# Patient Record
Sex: Male | Born: 1955 | Race: White | Hispanic: No | Marital: Married
Health system: Southern US, Academic
[De-identification: ages and names within clinical notes are randomized; demographics above are authoritative.]

## PROBLEM LIST (undated history)

## (undated) ENCOUNTER — Telehealth

## (undated) ENCOUNTER — Encounter

## (undated) ENCOUNTER — Encounter: Attending: Nephrology | Primary: Nephrology

## (undated) ENCOUNTER — Encounter: Payer: PRIVATE HEALTH INSURANCE | Attending: Nephrology | Primary: Nephrology

## (undated) ENCOUNTER — Ambulatory Visit

## (undated) ENCOUNTER — Telehealth: Attending: Geriatric Medicine | Primary: Geriatric Medicine

## (undated) ENCOUNTER — Other Ambulatory Visit

## (undated) ENCOUNTER — Ambulatory Visit: Payer: PRIVATE HEALTH INSURANCE

## (undated) ENCOUNTER — Encounter: Attending: Gastroenterology | Primary: Gastroenterology

## (undated) ENCOUNTER — Ambulatory Visit
Payer: MEDICARE | Attending: Student in an Organized Health Care Education/Training Program | Primary: Student in an Organized Health Care Education/Training Program

## (undated) ENCOUNTER — Ambulatory Visit: Attending: Nephrology | Primary: Nephrology

## (undated) ENCOUNTER — Ambulatory Visit: Payer: MEDICARE | Attending: Geriatric Medicine | Primary: Geriatric Medicine

## (undated) ENCOUNTER — Inpatient Hospital Stay

## (undated) ENCOUNTER — Ambulatory Visit: Payer: MEDICARE | Attending: Nephrology | Primary: Nephrology

## (undated) ENCOUNTER — Encounter
Attending: Student in an Organized Health Care Education/Training Program | Primary: Student in an Organized Health Care Education/Training Program

## (undated) ENCOUNTER — Ambulatory Visit: Payer: MEDICARE | Attending: Infectious Disease | Primary: Infectious Disease

## (undated) ENCOUNTER — Encounter: Attending: Pharmacist | Primary: Pharmacist

## (undated) ENCOUNTER — Encounter: Attending: Medical | Primary: Medical

## (undated) ENCOUNTER — Ambulatory Visit: Payer: MEDICARE

## (undated) DIAGNOSIS — E119 Type 2 diabetes mellitus without complications: Secondary | ICD-10-CM

## (undated) DIAGNOSIS — N189 Chronic kidney disease, unspecified: Secondary | ICD-10-CM

## (undated) DIAGNOSIS — E7800 Pure hypercholesterolemia, unspecified: Secondary | ICD-10-CM

## (undated) DIAGNOSIS — I10 Essential (primary) hypertension: Secondary | ICD-10-CM

## (undated) DIAGNOSIS — R011 Cardiac murmur, unspecified: Secondary | ICD-10-CM

## (undated) HISTORY — PX: CATARACT EXTRACTION: SUR2

## (undated) HISTORY — PX: PERITONEAL CATHETER INSERTION: SHX2223

---
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
## 2005-11-10 ENCOUNTER — Ambulatory Visit: Payer: Self-pay | Admitting: Unknown Physician Specialty

## 2005-11-13 ENCOUNTER — Ambulatory Visit: Payer: Self-pay | Admitting: Unknown Physician Specialty

## 2005-12-14 ENCOUNTER — Ambulatory Visit: Payer: Self-pay | Admitting: Unknown Physician Specialty

## 2006-10-06 ENCOUNTER — Ambulatory Visit: Payer: Self-pay | Admitting: Ophthalmology

## 2008-07-18 ENCOUNTER — Ambulatory Visit: Payer: Self-pay | Admitting: Gastroenterology

## 2008-09-07 ENCOUNTER — Ambulatory Visit: Payer: Self-pay | Admitting: Family Medicine

## 2010-08-01 HISTORY — PX: HERNIA REPAIR: SHX51

## 2011-01-13 DIAGNOSIS — I10 Essential (primary) hypertension: Secondary | ICD-10-CM | POA: Insufficient documentation

## 2011-09-01 HISTORY — PX: TRANSPLANTATION RENAL: SUR1385

## 2011-12-17 LAB — CBC WITH DIFFERENTIAL/PLATELET
Basophil %: 0.9 %
Eosinophil #: 0.3 10*3/uL (ref 0.0–0.7)
Eosinophil %: 6.4 %
HCT: 34.5 % — ABNORMAL LOW (ref 40.0–52.0)
Lymphocyte #: 0.3 10*3/uL — ABNORMAL LOW (ref 1.0–3.6)
MCH: 32.1 pg (ref 26.0–34.0)
MCHC: 32.5 g/dL (ref 32.0–36.0)
MCV: 99 fL (ref 80–100)
Monocyte #: 0.6 10*3/uL (ref 0.0–0.7)
Monocyte %: 10.9 %
Neutrophil #: 3.9 10*3/uL (ref 1.4–6.5)
Platelet: 213 10*3/uL (ref 150–440)
RBC: 3.49 10*6/uL — ABNORMAL LOW (ref 4.40–5.90)

## 2011-12-17 LAB — COMPREHENSIVE METABOLIC PANEL
Albumin: 3.4 g/dL (ref 3.4–5.0)
Alkaline Phosphatase: 113 U/L (ref 50–136)
Anion Gap: 4 — ABNORMAL LOW (ref 7–16)
Bilirubin,Total: 0.4 mg/dL (ref 0.2–1.0)
Creatinine: 1.37 mg/dL — ABNORMAL HIGH (ref 0.60–1.30)
Glucose: 241 mg/dL — ABNORMAL HIGH (ref 65–99)
Osmolality: 295 (ref 275–301)
Potassium: 5.5 mmol/L — ABNORMAL HIGH (ref 3.5–5.1)
Sodium: 139 mmol/L (ref 136–145)
Total Protein: 6.3 g/dL — ABNORMAL LOW (ref 6.4–8.2)

## 2011-12-17 LAB — MAGNESIUM: Magnesium: 2 mg/dL

## 2011-12-17 LAB — PROTIME-INR: Prothrombin Time: 20.1 secs — ABNORMAL HIGH (ref 11.5–14.7)

## 2011-12-24 LAB — CBC WITH DIFFERENTIAL/PLATELET
Basophil #: 0 10*3/uL (ref 0.0–0.1)
Eosinophil #: 0.2 10*3/uL (ref 0.0–0.7)
Lymphocyte #: 0.3 10*3/uL — ABNORMAL LOW (ref 1.0–3.6)
Lymphocyte %: 5.2 %
MCHC: 33.8 g/dL (ref 32.0–36.0)
MCV: 99 fL (ref 80–100)
Monocyte #: 0.4 10*3/uL (ref 0.0–0.7)
Monocyte %: 8.2 %
Neutrophil #: 4 10*3/uL (ref 1.4–6.5)
Neutrophil %: 81.5 %
Platelet: 200 10*3/uL (ref 150–440)
RBC: 3.21 10*6/uL — ABNORMAL LOW (ref 4.40–5.90)
RDW: 12.5 % (ref 11.5–14.5)

## 2011-12-24 LAB — BASIC METABOLIC PANEL
Anion Gap: 3 — ABNORMAL LOW (ref 7–16)
Calcium, Total: 10.3 mg/dL — ABNORMAL HIGH (ref 8.5–10.1)
Co2: 28 mmol/L (ref 21–32)
EGFR (African American): >60
EGFR (Non-African Amer.): >60
Glucose: 125 mg/dL — ABNORMAL HIGH (ref 65–99)
Osmolality: 287 (ref 275–301)

## 2011-12-24 LAB — PROTEIN / CREATININE RATIO, URINE: Protein, Random Urine: 76 mg/dL — ABNORMAL HIGH (ref 0–12)

## 2011-12-24 LAB — PHOSPHORUS: Phosphorus: 2.8 mg/dL (ref 2.5–4.9)

## 2011-12-28 LAB — BASIC METABOLIC PANEL
Anion Gap: 5 — ABNORMAL LOW (ref 7–16)
Calcium, Total: 10.3 mg/dL — ABNORMAL HIGH (ref 8.5–10.1)
Chloride: 108 mmol/L — ABNORMAL HIGH (ref 98–107)
Co2: 27 mmol/L (ref 21–32)
Creatinine: 1.23 mg/dL (ref 0.60–1.30)
Potassium: 4.9 mmol/L (ref 3.5–5.1)

## 2011-12-28 LAB — CBC WITH DIFFERENTIAL/PLATELET
Basophil #: 0 10*3/uL (ref 0.0–0.1)
Basophil %: 0.7 %
Eosinophil #: 0.2 10*3/uL (ref 0.0–0.7)
Eosinophil %: 4.9 %
HCT: 31.5 % — ABNORMAL LOW (ref 40.0–52.0)
HGB: 10.5 g/dL — ABNORMAL LOW (ref 13.0–18.0)
Lymphocyte #: 0.2 10*3/uL — ABNORMAL LOW (ref 1.0–3.6)
Lymphocyte %: 4.5 %
MCH: 32.9 pg (ref 26.0–34.0)
MCHC: 33.2 g/dL (ref 32.0–36.0)
Monocyte #: 0.5 10*3/uL (ref 0.0–0.7)
Neutrophil #: 3.7 10*3/uL (ref 1.4–6.5)
RBC: 3.18 10*6/uL — ABNORMAL LOW (ref 4.40–5.90)
RDW: 12.5 % (ref 11.5–14.5)
WBC: 4.6 10*3/uL (ref 3.8–10.6)

## 2011-12-28 LAB — PHOSPHORUS: Phosphorus: 2.3 mg/dL — ABNORMAL LOW (ref 2.5–4.9)

## 2011-12-28 LAB — PROTIME-INR
INR: 2
Prothrombin Time: 22.9 secs — ABNORMAL HIGH (ref 11.5–14.7)

## 2011-12-31 LAB — BASIC METABOLIC PANEL
Anion Gap: 3 — ABNORMAL LOW (ref 7–16)
BUN: 22 mg/dL — ABNORMAL HIGH (ref 7–18)
Calcium, Total: 10.4 mg/dL — ABNORMAL HIGH (ref 8.5–10.1)
Co2: 29 mmol/L (ref 21–32)
Creatinine: 1.32 mg/dL — ABNORMAL HIGH (ref 0.60–1.30)
EGFR (African American): >60

## 2011-12-31 LAB — CBC WITH DIFFERENTIAL/PLATELET
Basophil #: 0 10*3/uL (ref 0.0–0.1)
Basophil %: 0.8 %
HCT: 32.5 % — ABNORMAL LOW (ref 40.0–52.0)
HGB: 10.7 g/dL — ABNORMAL LOW (ref 13.0–18.0)
Lymphocyte #: 0.2 10*3/uL — ABNORMAL LOW (ref 1.0–3.6)
Lymphocyte %: 5.1 %
MCH: 32.5 pg (ref 26.0–34.0)
MCV: 99 fL (ref 80–100)
Monocyte %: 12.9 %
Neutrophil #: 3.2 10*3/uL (ref 1.4–6.5)
RBC: 3.28 10*6/uL — ABNORMAL LOW (ref 4.40–5.90)
RDW: 12.4 % (ref 11.5–14.5)

## 2011-12-31 LAB — PHOSPHORUS: Phosphorus: 2.4 mg/dL — ABNORMAL LOW (ref 2.5–4.9)

## 2011-12-31 LAB — PROTIME-INR
INR: 1.9
Prothrombin Time: 22.4 secs — ABNORMAL HIGH (ref 11.5–14.7)

## 2012-01-05 LAB — BASIC METABOLIC PANEL
Calcium, Total: 10.1 mg/dL (ref 8.5–10.1)
Chloride: 108 mmol/L — ABNORMAL HIGH (ref 98–107)
Co2: 29 mmol/L (ref 21–32)
EGFR (African American): >60
Osmolality: 289 (ref 275–301)
Potassium: 4.8 mmol/L (ref 3.5–5.1)
Sodium: 141 mmol/L (ref 136–145)

## 2012-01-05 LAB — CBC WITH DIFFERENTIAL/PLATELET
Basophil #: 0 10*3/uL (ref 0.0–0.1)
Eosinophil #: 0.3 10*3/uL (ref 0.0–0.7)
Lymphocyte %: 4 %
MCHC: 32.9 g/dL (ref 32.0–36.0)
Neutrophil %: 81.6 %
Platelet: 232 10*3/uL (ref 150–440)
RDW: 11.8 % (ref 11.5–14.5)

## 2012-01-05 LAB — PROTIME-INR: Prothrombin Time: 24.1 secs — ABNORMAL HIGH (ref 11.5–14.7)

## 2012-01-05 LAB — MAGNESIUM: Magnesium: 1.7 mg/dL — ABNORMAL LOW

## 2012-01-08 LAB — CBC WITH DIFFERENTIAL/PLATELET
Basophil #: 0 10*3/uL (ref 0.0–0.1)
Lymphocyte #: 0.2 10*3/uL — ABNORMAL LOW (ref 1.0–3.6)
MCH: 32.8 pg (ref 26.0–34.0)
Monocyte #: 0.6 10*3/uL (ref 0.0–0.7)
Monocyte %: 9.9 %
Neutrophil %: 81.3 %
Platelet: 229 10*3/uL (ref 150–440)
RBC: 3.22 10*6/uL — ABNORMAL LOW (ref 4.40–5.90)
RDW: 12 % (ref 11.5–14.5)
WBC: 6 10*3/uL (ref 3.8–10.6)

## 2012-01-08 LAB — BASIC METABOLIC PANEL
BUN: 25 mg/dL — ABNORMAL HIGH (ref 7–18)
EGFR (African American): >60
EGFR (Non-African Amer.): >60
Glucose: 154 mg/dL — ABNORMAL HIGH (ref 65–99)
Osmolality: 287 (ref 275–301)
Potassium: 4.8 mmol/L (ref 3.5–5.1)
Sodium: 140 mmol/L (ref 136–145)

## 2012-01-08 LAB — MAGNESIUM: Magnesium: 1.6 mg/dL — ABNORMAL LOW

## 2012-01-12 LAB — PROTIME-INR
INR: 2.3
Prothrombin Time: 25.9 secs — ABNORMAL HIGH (ref 11.5–14.7)

## 2012-01-12 LAB — CBC WITH DIFFERENTIAL/PLATELET
Basophil %: 0.6 %
Eosinophil #: 0.2 10*3/uL (ref 0.0–0.7)
HGB: 10.7 g/dL — ABNORMAL LOW (ref 13.0–18.0)
Lymphocyte #: 0.2 10*3/uL — ABNORMAL LOW (ref 1.0–3.6)
Lymphocyte %: 2.8 %
MCH: 32.5 pg (ref 26.0–34.0)
MCHC: 33.2 g/dL (ref 32.0–36.0)
Monocyte %: 7.8 %
Neutrophil #: 7.6 10*3/uL — ABNORMAL HIGH (ref 1.4–6.5)
RBC: 3.28 10*6/uL — ABNORMAL LOW (ref 4.40–5.90)

## 2012-01-12 LAB — BASIC METABOLIC PANEL
Calcium, Total: 10.2 mg/dL — ABNORMAL HIGH (ref 8.5–10.1)
Chloride: 106 mmol/L (ref 98–107)
Creatinine: 1.32 mg/dL — ABNORMAL HIGH (ref 0.60–1.30)
EGFR (Non-African Amer.): >60
Glucose: 139 mg/dL — ABNORMAL HIGH (ref 65–99)
Osmolality: 285 (ref 275–301)
Potassium: 4.6 mmol/L (ref 3.5–5.1)

## 2012-01-12 LAB — MAGNESIUM: Magnesium: 1.8 mg/dL

## 2012-01-12 LAB — PHOSPHORUS: Phosphorus: 2 mg/dL — ABNORMAL LOW (ref 2.5–4.9)

## 2012-01-13 LAB — URINALYSIS, COMPLETE
Bilirubin,UR: NEGATIVE
Glucose,UR: NEGATIVE mg/dL (ref 0–75)
Ph: 6 (ref 4.5–8.0)
Protein: 300

## 2012-01-15 LAB — URINE CULTURE

## 2012-01-25 LAB — CBC WITH DIFFERENTIAL/PLATELET
Basophil %: 0.8 %
Eosinophil #: 0.2 10*3/uL (ref 0.0–0.7)
Eosinophil %: 4 %
HGB: 10.4 g/dL — ABNORMAL LOW (ref 13.0–18.0)
Lymphocyte #: 0.3 10*3/uL — ABNORMAL LOW (ref 1.0–3.6)
MCH: 31.3 pg (ref 26.0–34.0)
MCHC: 32.3 g/dL (ref 32.0–36.0)
Monocyte #: 0.4 10*3/uL (ref 0.0–0.7)
Monocyte %: 6.4 %
Neutrophil #: 5 10*3/uL (ref 1.4–6.5)
Neutrophil %: 83.2 %
Platelet: 346 10*3/uL (ref 150–440)
RBC: 3.33 10*6/uL — ABNORMAL LOW (ref 4.40–5.90)
WBC: 5.9 10*3/uL (ref 3.8–10.6)

## 2012-01-25 LAB — BASIC METABOLIC PANEL
BUN: 21 mg/dL — ABNORMAL HIGH (ref 7–18)
Co2: 28 mmol/L (ref 21–32)
EGFR (African American): >60
Glucose: 105 mg/dL — ABNORMAL HIGH (ref 65–99)
Osmolality: 286 (ref 275–301)

## 2012-01-25 LAB — PHOSPHORUS: Phosphorus: 2.7 mg/dL (ref 2.5–4.9)

## 2012-01-25 LAB — PROTIME-INR
INR: 1.9
Prothrombin Time: 22.4 secs — ABNORMAL HIGH (ref 11.5–14.7)

## 2012-02-12 DIAGNOSIS — Z86718 Personal history of other venous thrombosis and embolism: Secondary | ICD-10-CM | POA: Insufficient documentation

## 2012-02-26 LAB — CBC WITH DIFFERENTIAL/PLATELET
Basophil #: 0 10*3/uL (ref 0.0–0.1)
Basophil %: 0.9 %
Eosinophil %: 4.9 %
HGB: 10.3 g/dL — ABNORMAL LOW (ref 13.0–18.0)
Lymphocyte #: 0.3 10*3/uL — ABNORMAL LOW (ref 1.0–3.6)
Lymphocyte %: 7 %
MCH: 31.5 pg (ref 26.0–34.0)
Monocyte %: 8.3 %
Neutrophil %: 78.9 %
RBC: 3.27 10*6/uL — ABNORMAL LOW (ref 4.40–5.90)
RDW: 12.9 % (ref 11.5–14.5)
WBC: 4.4 10*3/uL (ref 3.8–10.6)

## 2012-02-26 LAB — BASIC METABOLIC PANEL
Anion Gap: 4 — ABNORMAL LOW (ref 7–16)
BUN: 29 mg/dL — ABNORMAL HIGH (ref 7–18)
Calcium, Total: 9.8 mg/dL (ref 8.5–10.1)
Co2: 31 mmol/L (ref 21–32)
Creatinine: 1.31 mg/dL — ABNORMAL HIGH (ref 0.60–1.30)
EGFR (African American): >60
EGFR (Non-African Amer.): >60
Glucose: 192 mg/dL — ABNORMAL HIGH (ref 65–99)

## 2012-02-26 LAB — PROTIME-INR
INR: 1.4
Prothrombin Time: 17.1 secs — ABNORMAL HIGH (ref 11.5–14.7)

## 2012-02-26 LAB — PHOSPHORUS: Phosphorus: 3.9 mg/dL (ref 2.5–4.9)

## 2012-02-26 LAB — MAGNESIUM: Magnesium: 1.7 mg/dL — ABNORMAL LOW

## 2012-03-25 LAB — CBC WITH DIFFERENTIAL/PLATELET
Basophil #: 0 10*3/uL (ref 0.0–0.1)
Basophil %: 0.9 %
Eosinophil #: 0.2 10*3/uL (ref 0.0–0.7)
Eosinophil %: 4.9 %
HCT: 31.9 % — ABNORMAL LOW (ref 40.0–52.0)
Lymphocyte #: 0.4 10*3/uL — ABNORMAL LOW (ref 1.0–3.6)
Lymphocyte %: 8.8 %
MCH: 30.9 pg (ref 26.0–34.0)
MCHC: 32.7 g/dL (ref 32.0–36.0)
MCV: 94 fL (ref 80–100)
Monocyte #: 0.6 x10 3/mm (ref 0.2–1.0)
Neutrophil #: 3.2 10*3/uL (ref 1.4–6.5)
Neutrophil %: 72.8 %
Platelet: 170 10*3/uL (ref 150–440)
RBC: 3.38 10*6/uL — ABNORMAL LOW (ref 4.40–5.90)
RDW: 13.3 % (ref 11.5–14.5)

## 2012-03-25 LAB — PROTIME-INR
INR: 2.2
Prothrombin Time: 24.8 secs — ABNORMAL HIGH (ref 11.5–14.7)

## 2012-03-25 LAB — BASIC METABOLIC PANEL
Chloride: 109 mmol/L — ABNORMAL HIGH (ref 98–107)
Creatinine: 1.24 mg/dL (ref 0.60–1.30)
Glucose: 51 mg/dL — ABNORMAL LOW (ref 65–99)
Osmolality: 291 (ref 275–301)
Potassium: 4.1 mmol/L (ref 3.5–5.1)

## 2012-05-02 LAB — CBC WITH DIFFERENTIAL/PLATELET
Basophil #: 0 10*3/uL (ref 0.0–0.1)
Basophil %: 0.4 %
Eosinophil #: 0.2 10*3/uL (ref 0.0–0.7)
HGB: 10.8 g/dL — ABNORMAL LOW (ref 13.0–18.0)
Lymphocyte %: 7.6 %
MCHC: 32 g/dL (ref 32.0–36.0)
MCV: 95 fL (ref 80–100)
Monocyte #: 0.4 x10 3/mm (ref 0.2–1.0)
Neutrophil #: 3.9 10*3/uL (ref 1.4–6.5)
RDW: 13.8 % (ref 11.5–14.5)

## 2012-05-02 LAB — BASIC METABOLIC PANEL
BUN: 39 mg/dL — ABNORMAL HIGH (ref 7–18)
Calcium, Total: 9.6 mg/dL (ref 8.5–10.1)
Creatinine: 1.28 mg/dL (ref 0.60–1.30)
EGFR (African American): >60
EGFR (Non-African Amer.): >60
Sodium: 141 mmol/L (ref 136–145)

## 2012-05-02 LAB — PHOSPHORUS: Phosphorus: 2.6 mg/dL (ref 2.5–4.9)

## 2012-05-02 LAB — MAGNESIUM: Magnesium: 1.6 mg/dL — ABNORMAL LOW

## 2012-06-20 LAB — CBC WITH DIFFERENTIAL/PLATELET
Basophil %: 1.1 %
Eosinophil #: 0.2 10*3/uL (ref 0.0–0.7)
Eosinophil %: 4.9 %
Lymphocyte #: 0.5 10*3/uL — ABNORMAL LOW (ref 1.0–3.6)
Lymphocyte %: 11 %
MCH: 31.1 pg (ref 26.0–34.0)
MCV: 95 fL (ref 80–100)
Monocyte #: 0.5 x10 3/mm (ref 0.2–1.0)
Neutrophil #: 3.4 10*3/uL (ref 1.4–6.5)
Neutrophil %: 71.4 %
WBC: 4.7 10*3/uL (ref 3.8–10.6)

## 2012-06-20 LAB — PHOSPHORUS: Phosphorus: 2.7 mg/dL (ref 2.5–4.9)

## 2012-06-20 LAB — BASIC METABOLIC PANEL
Anion Gap: 7 (ref 7–16)
BUN: 35 mg/dL — ABNORMAL HIGH (ref 7–18)
Chloride: 110 mmol/L — ABNORMAL HIGH (ref 98–107)
Creatinine: 1.27 mg/dL (ref 0.60–1.30)
EGFR (African American): >60

## 2012-06-20 LAB — PROTIME-INR
INR: 0.9
Prothrombin Time: 12.8 secs (ref 11.5–14.7)

## 2012-06-20 LAB — MAGNESIUM: Magnesium: 1.8 mg/dL

## 2012-08-17 ENCOUNTER — Ambulatory Visit: Payer: Self-pay | Admitting: Ophthalmology

## 2012-09-12 LAB — BASIC METABOLIC PANEL
Anion Gap: 6 — ABNORMAL LOW (ref 7–16)
BUN: 36 mg/dL — ABNORMAL HIGH (ref 7–18)
EGFR (African American): >60
EGFR (Non-African Amer.): >60
Osmolality: 293 (ref 275–301)
Potassium: 4.5 mmol/L (ref 3.5–5.1)

## 2012-09-12 LAB — HEPATIC FUNCTION PANEL A (ARMC)
Bilirubin,Total: 0.4 mg/dL (ref 0.2–1.0)
SGPT (ALT): 29 U/L (ref 12–78)
Total Protein: 6.9 g/dL (ref 6.4–8.2)

## 2012-09-12 LAB — CBC WITH DIFFERENTIAL/PLATELET
Basophil #: 0 10*3/uL (ref 0.0–0.1)
Basophil %: 0.9 %
Eosinophil %: 3.2 %
HCT: 34 % — ABNORMAL LOW (ref 40.0–52.0)
HGB: 11.1 g/dL — ABNORMAL LOW (ref 13.0–18.0)
Lymphocyte #: 0.4 10*3/uL — ABNORMAL LOW (ref 1.0–3.6)
MCH: 31.5 pg (ref 26.0–34.0)
MCV: 97 fL (ref 80–100)
Monocyte %: 6.6 %
Neutrophil #: 4.6 10*3/uL (ref 1.4–6.5)
Platelet: 187 10*3/uL (ref 150–440)
RBC: 3.52 10*6/uL — ABNORMAL LOW (ref 4.40–5.90)
RDW: 13.2 % (ref 11.5–14.5)
WBC: 5.6 10*3/uL (ref 3.8–10.6)

## 2012-09-12 LAB — LIPID PANEL
Cholesterol: 98 mg/dL (ref 0–200)
HDL Cholesterol: 55 mg/dL (ref 40–60)
VLDL Cholesterol, Calc: 10 mg/dL (ref 5–40)

## 2012-09-12 LAB — MAGNESIUM: Magnesium: 1.8 mg/dL

## 2012-09-12 LAB — PHOSPHORUS: Phosphorus: 2.2 mg/dL — ABNORMAL LOW (ref 2.5–4.9)

## 2012-10-12 LAB — CBC WITH DIFFERENTIAL/PLATELET
Basophil #: 0.1 10*3/uL (ref 0.0–0.1)
Basophil %: 1.3 %
Eosinophil #: 0.2 10*3/uL (ref 0.0–0.7)
Eosinophil %: 3.6 %
HCT: 32 % — ABNORMAL LOW (ref 40.0–52.0)
HGB: 10.5 g/dL — ABNORMAL LOW (ref 13.0–18.0)
Lymphocyte %: 12.6 %
MCH: 31.9 pg (ref 26.0–34.0)
MCHC: 32.9 g/dL (ref 32.0–36.0)
Monocyte %: 10.8 %
Neutrophil %: 71.7 %
Platelet: 168 10*3/uL (ref 150–440)
RBC: 3.3 10*6/uL — ABNORMAL LOW (ref 4.40–5.90)

## 2012-10-12 LAB — BASIC METABOLIC PANEL
Anion Gap: 4 — ABNORMAL LOW (ref 7–16)
BUN: 32 mg/dL — ABNORMAL HIGH (ref 7–18)
Calcium, Total: 9.8 mg/dL (ref 8.5–10.1)
Co2: 29 mmol/L (ref 21–32)
EGFR (Non-African Amer.): >60
Glucose: 86 mg/dL (ref 65–99)
Osmolality: 291 (ref 275–301)
Potassium: 4.6 mmol/L (ref 3.5–5.1)

## 2012-10-12 LAB — PHOSPHORUS: Phosphorus: 2.5 mg/dL (ref 2.5–4.9)

## 2012-11-14 ENCOUNTER — Encounter: Payer: Self-pay | Admitting: Nephrology

## 2012-11-14 LAB — CBC WITH DIFFERENTIAL/PLATELET
Basophil #: 0 10*3/uL (ref 0.0–0.1)
Lymphocyte #: 0.6 10*3/uL — ABNORMAL LOW (ref 1.0–3.6)
MCH: 31.2 pg (ref 26.0–34.0)
MCV: 97 fL (ref 80–100)
Monocyte #: 0.4 x10 3/mm (ref 0.2–1.0)
Monocyte %: 8.4 %
Platelet: 178 10*3/uL (ref 150–440)
RDW: 12.7 % (ref 11.5–14.5)
WBC: 4.8 10*3/uL (ref 3.8–10.6)

## 2012-11-14 LAB — BASIC METABOLIC PANEL
Anion Gap: 4 — ABNORMAL LOW (ref 7–16)
BUN: 34 mg/dL — ABNORMAL HIGH (ref 7–18)
Co2: 31 mmol/L (ref 21–32)
Creatinine: 1.39 mg/dL — ABNORMAL HIGH (ref 0.60–1.30)
EGFR (African American): >60
EGFR (Non-African Amer.): 57 — ABNORMAL LOW
Glucose: 109 mg/dL — ABNORMAL HIGH (ref 65–99)
Osmolality: 295 (ref 275–301)

## 2012-11-14 LAB — MAGNESIUM: Magnesium: 1.6 mg/dL — ABNORMAL LOW

## 2012-11-14 LAB — PHOSPHORUS: Phosphorus: 2.6 mg/dL (ref 2.5–4.9)

## 2012-11-28 LAB — PHOSPHORUS: Phosphorus: 2.5 mg/dL (ref 2.5–4.9)

## 2012-11-28 LAB — BASIC METABOLIC PANEL
Anion Gap: 5 — ABNORMAL LOW (ref 7–16)
Calcium, Total: 10 mg/dL (ref 8.5–10.1)
Chloride: 111 mmol/L — ABNORMAL HIGH (ref 98–107)
Co2: 29 mmol/L (ref 21–32)
EGFR (African American): >60
EGFR (Non-African Amer.): 60 — ABNORMAL LOW
Osmolality: 295 (ref 275–301)
Sodium: 145 mmol/L (ref 136–145)

## 2012-12-14 ENCOUNTER — Encounter: Payer: Self-pay | Admitting: Nephrology

## 2013-02-07 ENCOUNTER — Encounter: Payer: Self-pay | Admitting: Nephrology

## 2013-02-07 LAB — ALT: SGPT (ALT): 23 U/L (ref 12–78)

## 2013-02-07 LAB — CBC WITH DIFFERENTIAL/PLATELET
Basophil #: 0.1 10*3/uL (ref 0.0–0.1)
Basophil %: 1.4 %
Eosinophil #: 0.2 10*3/uL (ref 0.0–0.7)
Eosinophil %: 4.6 %
HCT: 33 % — ABNORMAL LOW (ref 40.0–52.0)
HGB: 10.9 g/dL — ABNORMAL LOW (ref 13.0–18.0)
Lymphocyte #: 0.6 10*3/uL — ABNORMAL LOW (ref 1.0–3.6)
Lymphocyte %: 12.8 %
MCH: 31.2 pg (ref 26.0–34.0)
MCHC: 33.1 g/dL (ref 32.0–36.0)
MCV: 94 fL (ref 80–100)
Monocyte %: 9.2 %
Platelet: 155 10*3/uL (ref 150–440)
RDW: 12.9 % (ref 11.5–14.5)
WBC: 4.4 10*3/uL (ref 3.8–10.6)

## 2013-02-07 LAB — LIPID PANEL
Cholesterol: 85 mg/dL (ref 0–200)
HDL Cholesterol: 42 mg/dL (ref 40–60)

## 2013-02-07 LAB — BASIC METABOLIC PANEL
Anion Gap: 8 (ref 7–16)
Chloride: 109 mmol/L — ABNORMAL HIGH (ref 98–107)
Co2: 25 mmol/L (ref 21–32)
EGFR (African American): 48 — ABNORMAL LOW
EGFR (Non-African Amer.): 41 — ABNORMAL LOW
Glucose: 119 mg/dL — ABNORMAL HIGH (ref 65–99)
Osmolality: 296 (ref 275–301)
Potassium: 5.7 mmol/L — ABNORMAL HIGH (ref 3.5–5.1)

## 2013-02-07 LAB — SGOT (AST)(ARMC): SGOT(AST): 18 U/L (ref 15–37)

## 2013-02-07 LAB — PHOSPHORUS: Phosphorus: 2.8 mg/dL (ref 2.5–4.9)

## 2013-02-07 LAB — BILIRUBIN, TOTAL: Bilirubin,Total: 0.5 mg/dL (ref 0.2–1.0)

## 2013-02-07 LAB — MAGNESIUM: Magnesium: 1.9 mg/dL

## 2013-02-07 LAB — HEMOGLOBIN A1C: Hemoglobin A1C: 5.9 % (ref 4.2–6.3)

## 2013-02-11 ENCOUNTER — Encounter: Payer: Self-pay | Admitting: Nephrology

## 2013-02-13 LAB — BASIC METABOLIC PANEL
Anion Gap: 5 — ABNORMAL LOW (ref 7–16)
BUN: 46 mg/dL — ABNORMAL HIGH (ref 7–18)
Calcium, Total: 9.9 mg/dL (ref 8.5–10.1)
Co2: 26 mmol/L (ref 21–32)
Creatinine: 1.67 mg/dL — ABNORMAL HIGH (ref 0.60–1.30)
EGFR (Non-African Amer.): 46 — ABNORMAL LOW
Glucose: 35 mg/dL — CL (ref 65–99)
Osmolality: 293 (ref 275–301)
Sodium: 143 mmol/L (ref 136–145)

## 2013-02-13 LAB — CBC WITH DIFFERENTIAL/PLATELET
Basophil #: 0.1 10*3/uL (ref 0.0–0.1)
Basophil %: 1 %
Eosinophil #: 0.3 10*3/uL (ref 0.0–0.7)
Eosinophil %: 4.2 %
HCT: 33.5 % — ABNORMAL LOW (ref 40.0–52.0)
HGB: 11.1 g/dL — ABNORMAL LOW (ref 13.0–18.0)
Lymphocyte #: 1.1 10*3/uL (ref 1.0–3.6)
MCHC: 33.2 g/dL (ref 32.0–36.0)
MCV: 94 fL (ref 80–100)
Monocyte #: 0.6 x10 3/mm (ref 0.2–1.0)
RBC: 3.56 10*6/uL — ABNORMAL LOW (ref 4.40–5.90)
RDW: 13.1 % (ref 11.5–14.5)

## 2013-02-13 LAB — PHOSPHORUS: Phosphorus: 3.2 mg/dL (ref 2.5–4.9)

## 2013-02-20 LAB — BASIC METABOLIC PANEL
Anion Gap: 11 (ref 7–16)
BUN: 39 mg/dL — ABNORMAL HIGH (ref 7–18)
EGFR (African American): >60
EGFR (Non-African Amer.): 54 — ABNORMAL LOW
Osmolality: 293 (ref 275–301)
Potassium: 5 mmol/L (ref 3.5–5.1)

## 2013-02-20 LAB — CBC WITH DIFFERENTIAL/PLATELET
Basophil #: 0 10*3/uL (ref 0.0–0.1)
Basophil %: 1 %
Eosinophil #: 0.3 10*3/uL (ref 0.0–0.7)
Eosinophil %: 5.3 %
Lymphocyte #: 0.6 10*3/uL — ABNORMAL LOW (ref 1.0–3.6)
MCH: 30.8 pg (ref 26.0–34.0)
MCHC: 32.8 g/dL (ref 32.0–36.0)
MCV: 94 fL (ref 80–100)
Monocyte %: 9.2 %
Neutrophil #: 3.7 10*3/uL (ref 1.4–6.5)
Neutrophil %: 72.6 %

## 2013-02-20 LAB — MAGNESIUM: Magnesium: 1.8 mg/dL

## 2013-02-20 LAB — PHOSPHORUS: Phosphorus: 3.5 mg/dL (ref 2.5–4.9)

## 2013-03-10 LAB — CBC WITH DIFFERENTIAL/PLATELET
Basophil #: 0.1 10*3/uL (ref 0.0–0.1)
Eosinophil #: 0.3 10*3/uL (ref 0.0–0.7)
Eosinophil %: 4.8 %
Lymphocyte #: 0.6 10*3/uL — ABNORMAL LOW (ref 1.0–3.6)
Lymphocyte %: 9.8 %
Neutrophil #: 4.4 10*3/uL (ref 1.4–6.5)
Neutrophil %: 74.6 %
Platelet: 186 10*3/uL (ref 150–440)
WBC: 6 10*3/uL (ref 3.8–10.6)

## 2013-03-10 LAB — PHOSPHORUS: Phosphorus: 2.7 mg/dL (ref 2.5–4.9)

## 2013-03-10 LAB — BASIC METABOLIC PANEL
Anion Gap: 8 (ref 7–16)
Chloride: 109 mmol/L — ABNORMAL HIGH (ref 98–107)
Co2: 26 mmol/L (ref 21–32)
Creatinine: 1.4 mg/dL — ABNORMAL HIGH (ref 0.60–1.30)
EGFR (Non-African Amer.): 57 — ABNORMAL LOW
Potassium: 4.9 mmol/L (ref 3.5–5.1)

## 2013-03-10 LAB — MAGNESIUM: Magnesium: 2.1 mg/dL

## 2013-03-14 ENCOUNTER — Encounter: Payer: Self-pay | Admitting: Nephrology

## 2013-04-04 LAB — BASIC METABOLIC PANEL
BUN: 34 mg/dL — ABNORMAL HIGH (ref 7–18)
Co2: 29 mmol/L (ref 21–32)
EGFR (Non-African Amer.): 54 — ABNORMAL LOW
Glucose: 99 mg/dL (ref 65–99)
Potassium: 5 mmol/L (ref 3.5–5.1)
Sodium: 144 mmol/L (ref 136–145)

## 2013-04-04 LAB — CBC WITH DIFFERENTIAL/PLATELET
Basophil %: 0.9 %
HCT: 35.5 % — ABNORMAL LOW (ref 40.0–52.0)
Lymphocyte #: 0.6 10*3/uL — ABNORMAL LOW (ref 1.0–3.6)
Lymphocyte %: 12.1 %
MCH: 30.5 pg (ref 26.0–34.0)
MCV: 94 fL (ref 80–100)
Monocyte #: 0.4 x10 3/mm (ref 0.2–1.0)
Neutrophil %: 73.1 %
Platelet: 171 10*3/uL (ref 150–440)
RDW: 13.1 % (ref 11.5–14.5)
WBC: 5.1 10*3/uL (ref 3.8–10.6)

## 2013-04-04 LAB — MAGNESIUM: Magnesium: 1.9 mg/dL

## 2013-04-04 LAB — PHOSPHORUS: Phosphorus: 3 mg/dL (ref 2.5–4.9)

## 2013-04-13 ENCOUNTER — Encounter: Payer: Self-pay | Admitting: Nephrology

## 2013-06-07 ENCOUNTER — Encounter: Payer: Self-pay | Admitting: Nephrology

## 2013-06-07 LAB — BASIC METABOLIC PANEL
Anion Gap: 5 — ABNORMAL LOW (ref 7–16)
Co2: 28 mmol/L (ref 21–32)
Creatinine: 1.76 mg/dL — ABNORMAL HIGH (ref 0.60–1.30)
EGFR (Non-African Amer.): 43 — ABNORMAL LOW
Osmolality: 303 (ref 275–301)
Potassium: 5.2 mmol/L — ABNORMAL HIGH (ref 3.5–5.1)
Sodium: 142 mmol/L (ref 136–145)

## 2013-06-07 LAB — CBC WITH DIFFERENTIAL/PLATELET
Basophil %: 1 %
Eosinophil #: 0.3 10*3/uL (ref 0.0–0.7)
HCT: 30.7 % — ABNORMAL LOW (ref 40.0–52.0)
HGB: 10 g/dL — ABNORMAL LOW (ref 13.0–18.0)
Lymphocyte #: 0.6 10*3/uL — ABNORMAL LOW (ref 1.0–3.6)
MCH: 30.5 pg (ref 26.0–34.0)
MCV: 94 fL (ref 80–100)
Monocyte %: 7.7 %
Neutrophil #: 3.8 10*3/uL (ref 1.4–6.5)
Neutrophil %: 72.8 %
Platelet: 158 10*3/uL (ref 150–440)
RBC: 3.28 10*6/uL — ABNORMAL LOW (ref 4.40–5.90)
WBC: 5.2 10*3/uL (ref 3.8–10.6)

## 2013-06-07 LAB — MAGNESIUM: Magnesium: 2.2 mg/dL

## 2013-06-13 ENCOUNTER — Encounter: Payer: Self-pay | Admitting: Nephrology

## 2013-07-12 LAB — BASIC METABOLIC PANEL
Anion Gap: 9 (ref 7–16)
BUN: 45 mg/dL — ABNORMAL HIGH (ref 7–18)
Calcium, Total: 9.9 mg/dL (ref 8.5–10.1)
Chloride: 108 mmol/L — ABNORMAL HIGH (ref 98–107)
Creatinine: 1.62 mg/dL — ABNORMAL HIGH (ref 0.60–1.30)
EGFR (Non-African Amer.): 47 — ABNORMAL LOW
Glucose: 165 mg/dL — ABNORMAL HIGH (ref 65–99)
Osmolality: 300 (ref 275–301)
Potassium: 4.8 mmol/L (ref 3.5–5.1)
Sodium: 143 mmol/L (ref 136–145)

## 2013-07-12 LAB — PHOSPHORUS: Phosphorus: 3 mg/dL (ref 2.5–4.9)

## 2013-07-12 LAB — LIPID PANEL
Cholesterol: 104 mg/dL (ref 0–200)
Ldl Cholesterol, Calc: 39 mg/dL (ref 0–100)
Triglycerides: 56 mg/dL (ref 0–200)
VLDL Cholesterol, Calc: 11 mg/dL (ref 5–40)

## 2013-07-12 LAB — CBC WITH DIFFERENTIAL/PLATELET
Basophil #: 0 10*3/uL (ref 0.0–0.1)
Eosinophil #: 0.3 10*3/uL (ref 0.0–0.7)
Eosinophil %: 5.1 %
HCT: 32.8 % — ABNORMAL LOW (ref 40.0–52.0)
HGB: 10.7 g/dL — ABNORMAL LOW (ref 13.0–18.0)
Lymphocyte %: 11.4 %
MCH: 30.6 pg (ref 26.0–34.0)
MCHC: 32.6 g/dL (ref 32.0–36.0)
MCV: 94 fL (ref 80–100)
Monocyte #: 0.4 x10 3/mm (ref 0.2–1.0)
Monocyte %: 7.9 %
Neutrophil #: 3.9 10*3/uL (ref 1.4–6.5)
Platelet: 175 10*3/uL (ref 150–440)
RBC: 3.49 10*6/uL — ABNORMAL LOW (ref 4.40–5.90)
WBC: 5.3 10*3/uL (ref 3.8–10.6)

## 2013-07-12 LAB — MAGNESIUM: Magnesium: 2 mg/dL

## 2013-07-12 LAB — HEPATIC FUNCTION PANEL A (ARMC)
Bilirubin, Direct: 0.1 mg/dL (ref 0.00–0.20)
Bilirubin,Total: 0.7 mg/dL (ref 0.2–1.0)
SGOT(AST): 19 U/L (ref 15–37)

## 2013-07-12 LAB — GAMMA GT: GGT: 18 U/L (ref 5–85)

## 2013-07-14 ENCOUNTER — Encounter: Payer: Self-pay | Admitting: Nephrology

## 2013-08-09 LAB — CBC WITH DIFFERENTIAL/PLATELET
Basophil #: 0 10*3/uL (ref 0.0–0.1)
Eosinophil #: 0.2 10*3/uL (ref 0.0–0.7)
Eosinophil %: 4.3 %
Lymphocyte #: 0.5 10*3/uL — ABNORMAL LOW (ref 1.0–3.6)
Lymphocyte %: 10.3 %
MCHC: 32.6 g/dL (ref 32.0–36.0)
Neutrophil #: 3.9 10*3/uL (ref 1.4–6.5)
Neutrophil %: 76.3 %
RBC: 3.26 10*6/uL — ABNORMAL LOW (ref 4.40–5.90)

## 2013-08-09 LAB — BASIC METABOLIC PANEL
Anion Gap: 7 (ref 7–16)
BUN: 38 mg/dL — ABNORMAL HIGH (ref 7–18)
Calcium, Total: 9.4 mg/dL (ref 8.5–10.1)
Chloride: 110 mmol/L — ABNORMAL HIGH (ref 98–107)
Creatinine: 1.49 mg/dL — ABNORMAL HIGH (ref 0.60–1.30)
EGFR (African American): >60
EGFR (Non-African Amer.): 52 — ABNORMAL LOW
Glucose: 97 mg/dL (ref 65–99)
Potassium: 5 mmol/L (ref 3.5–5.1)

## 2013-08-09 LAB — MAGNESIUM: Magnesium: 2.1 mg/dL

## 2013-08-09 LAB — PHOSPHORUS: Phosphorus: 2.7 mg/dL (ref 2.5–4.9)

## 2013-08-14 ENCOUNTER — Encounter: Payer: Self-pay | Admitting: Nephrology

## 2013-08-18 ENCOUNTER — Ambulatory Visit: Payer: Self-pay | Admitting: Otolaryngology

## 2013-08-21 ENCOUNTER — Ambulatory Visit: Payer: Self-pay | Admitting: Otolaryngology

## 2013-08-21 LAB — BASIC METABOLIC PANEL WITH GFR
Anion Gap: 2 — ABNORMAL LOW
BUN: 50 mg/dL — ABNORMAL HIGH
Calcium, Total: 9.7 mg/dL
Chloride: 109 mmol/L — ABNORMAL HIGH
Co2: 29 mmol/L
Creatinine: 1.73 mg/dL — ABNORMAL HIGH
EGFR (African American): 50 — ABNORMAL LOW
EGFR (Non-African Amer.): 43 — ABNORMAL LOW
Glucose: 214 mg/dL — ABNORMAL HIGH
Osmolality: 299
Potassium: 5.4 mmol/L — ABNORMAL HIGH
Sodium: 140 mmol/L

## 2013-09-06 ENCOUNTER — Ambulatory Visit: Payer: Self-pay | Admitting: Otolaryngology

## 2013-09-07 LAB — PATHOLOGY REPORT

## 2013-12-01 ENCOUNTER — Encounter: Payer: Self-pay | Admitting: Nephrology

## 2013-12-14 ENCOUNTER — Encounter: Payer: Self-pay | Admitting: Nephrology

## 2014-02-19 ENCOUNTER — Encounter: Payer: Self-pay | Admitting: Nephrology

## 2014-02-19 LAB — CBC WITH DIFFERENTIAL/PLATELET
BASOS ABS: 0.1 10*3/uL (ref 0.0–0.1)
Basophil %: 1.1 %
Eosinophil #: 0.3 10*3/uL (ref 0.0–0.7)
Eosinophil %: 6.2 %
HCT: 31.7 % — ABNORMAL LOW (ref 40.0–52.0)
HGB: 10.1 g/dL — ABNORMAL LOW (ref 13.0–18.0)
LYMPHS PCT: 16.8 %
Lymphocyte #: 0.8 10*3/uL — ABNORMAL LOW (ref 1.0–3.6)
MCH: 30.7 pg (ref 26.0–34.0)
MCHC: 31.9 g/dL — ABNORMAL LOW (ref 32.0–36.0)
MCV: 96 fL (ref 80–100)
MONOS PCT: 8.7 %
Monocyte #: 0.4 x10 3/mm (ref 0.2–1.0)
NEUTROS PCT: 67.2 %
Neutrophil #: 3.2 10*3/uL (ref 1.4–6.5)
PLATELETS: 170 10*3/uL (ref 150–440)
RBC: 3.3 10*6/uL — ABNORMAL LOW (ref 4.40–5.90)
RDW: 13.4 % (ref 11.5–14.5)
WBC: 4.7 10*3/uL (ref 3.8–10.6)

## 2014-02-19 LAB — MAGNESIUM: Magnesium: 2.3 mg/dL

## 2014-02-19 LAB — BASIC METABOLIC PANEL
Anion Gap: 10 (ref 7–16)
BUN: 52 mg/dL — AB (ref 7–18)
CREATININE: 1.65 mg/dL — AB (ref 0.60–1.30)
Calcium, Total: 9.3 mg/dL (ref 8.5–10.1)
Chloride: 107 mmol/L (ref 98–107)
Co2: 25 mmol/L (ref 21–32)
GFR CALC AF AMER: 53 — AB
GFR CALC NON AF AMER: 46 — AB
Glucose: 168 mg/dL — ABNORMAL HIGH (ref 65–99)
Osmolality: 301 (ref 275–301)
POTASSIUM: 4.7 mmol/L (ref 3.5–5.1)
Sodium: 142 mmol/L (ref 136–145)

## 2014-02-19 LAB — PHOSPHORUS: Phosphorus: 3.4 mg/dL (ref 2.5–4.9)

## 2014-03-14 ENCOUNTER — Encounter: Payer: Self-pay | Admitting: Nephrology

## 2014-04-02 LAB — CBC WITH DIFFERENTIAL/PLATELET
Comment - H1-Com1: NORMAL
Eosinophil: 11 %
HCT: 34 % — AB (ref 40.0–52.0)
HGB: 10.8 g/dL — ABNORMAL LOW (ref 13.0–18.0)
Lymphocytes: 13 %
MCH: 30.7 pg (ref 26.0–34.0)
MCHC: 31.9 g/dL — ABNORMAL LOW (ref 32.0–36.0)
MCV: 96 fL (ref 80–100)
Monocytes: 10 %
Platelet: 179 10*3/uL (ref 150–440)
RBC: 3.54 10*6/uL — AB (ref 4.40–5.90)
RDW: 13.2 % (ref 11.5–14.5)
Segmented Neutrophils: 66 %
WBC: 3.9 10*3/uL (ref 3.8–10.6)

## 2014-04-02 LAB — BASIC METABOLIC PANEL
Anion Gap: 8 (ref 7–16)
BUN: 46 mg/dL — AB (ref 7–18)
CALCIUM: 11 mg/dL — AB (ref 8.5–10.1)
Chloride: 108 mmol/L — ABNORMAL HIGH (ref 98–107)
Co2: 26 mmol/L (ref 21–32)
Creatinine: 1.67 mg/dL — ABNORMAL HIGH (ref 0.60–1.30)
EGFR (African American): 53 — ABNORMAL LOW
GFR CALC NON AF AMER: 45 — AB
Glucose: 141 mg/dL — ABNORMAL HIGH (ref 65–99)
Osmolality: 297 (ref 275–301)
POTASSIUM: 5.6 mmol/L — AB (ref 3.5–5.1)
SODIUM: 142 mmol/L (ref 136–145)

## 2014-04-02 LAB — MAGNESIUM: Magnesium: 2.3 mg/dL

## 2014-04-02 LAB — PHOSPHORUS: PHOSPHORUS: 3 mg/dL (ref 2.5–4.9)

## 2014-04-13 ENCOUNTER — Encounter: Payer: Self-pay | Admitting: Nephrology

## 2014-07-30 ENCOUNTER — Ambulatory Visit: Payer: Self-pay | Admitting: Nephrology

## 2014-07-30 LAB — GAMMA GT: GGT: 22 U/L (ref 5–85)

## 2014-07-30 LAB — SGOT (AST)(ARMC): SGOT(AST): 19 U/L (ref 15–37)

## 2014-07-30 LAB — CBC WITH DIFFERENTIAL/PLATELET
BASOS ABS: 0 10*3/uL (ref 0.0–0.1)
BASOS PCT: 1.7 %
EOS ABS: 0.3 10*3/uL (ref 0.0–0.7)
Eosinophil %: 11 %
HCT: 29.8 % — AB (ref 40.0–52.0)
HGB: 9.6 g/dL — ABNORMAL LOW (ref 13.0–18.0)
LYMPHS ABS: 0.7 10*3/uL — AB (ref 1.0–3.6)
LYMPHS PCT: 23.8 %
MCH: 30.6 pg (ref 26.0–34.0)
MCHC: 32.1 g/dL (ref 32.0–36.0)
MCV: 96 fL (ref 80–100)
MONO ABS: 0.4 x10 3/mm (ref 0.2–1.0)
Monocyte %: 12.9 %
Neutrophil #: 1.4 10*3/uL (ref 1.4–6.5)
Neutrophil %: 50.6 %
Platelet: 177 10*3/uL (ref 150–440)
RBC: 3.12 10*6/uL — AB (ref 4.40–5.90)
RDW: 13.1 % (ref 11.5–14.5)
WBC: 2.8 10*3/uL — AB (ref 3.8–10.6)

## 2014-07-30 LAB — BASIC METABOLIC PANEL
ANION GAP: 8 (ref 7–16)
BUN: 54 mg/dL — ABNORMAL HIGH (ref 7–18)
CALCIUM: 9.2 mg/dL (ref 8.5–10.1)
Chloride: 110 mmol/L — ABNORMAL HIGH (ref 98–107)
Co2: 27 mmol/L (ref 21–32)
Creatinine: 1.85 mg/dL — ABNORMAL HIGH (ref 0.60–1.30)
EGFR (African American): 46 — ABNORMAL LOW
GFR CALC NON AF AMER: 40 — AB
Glucose: 83 mg/dL (ref 65–99)
OSMOLALITY: 303 (ref 275–301)
Potassium: 4.8 mmol/L (ref 3.5–5.1)
SODIUM: 145 mmol/L (ref 136–145)

## 2014-07-30 LAB — ALBUMIN: Albumin: 3.7 g/dL (ref 3.4–5.0)

## 2014-07-30 LAB — MAGNESIUM: Magnesium: 2.2 mg/dL

## 2014-07-30 LAB — LIPID PANEL
Cholesterol: 92 mg/dL (ref 0–200)
HDL Cholesterol: 53 mg/dL (ref 40–60)
LDL CHOLESTEROL, CALC: 27 mg/dL (ref 0–100)
Triglycerides: 62 mg/dL (ref 0–200)
VLDL Cholesterol, Calc: 12 mg/dL (ref 5–40)

## 2014-07-30 LAB — ALT: SGPT (ALT): 25 U/L

## 2014-07-30 LAB — BILIRUBIN, TOTAL: Bilirubin,Total: 0.8 mg/dL (ref 0.2–1.0)

## 2014-07-30 LAB — ALKALINE PHOSPHATASE: ALK PHOS: 52 U/L

## 2014-07-30 LAB — PHOSPHORUS: Phosphorus: 3.2 mg/dL (ref 2.5–4.9)

## 2014-08-13 ENCOUNTER — Ambulatory Visit: Payer: Self-pay | Admitting: Nephrology

## 2014-08-13 LAB — CBC WITH DIFFERENTIAL/PLATELET
Basophil #: 0.1 10*3/uL (ref 0.0–0.1)
Basophil %: 2.3 %
EOS ABS: 0.3 10*3/uL (ref 0.0–0.7)
Eosinophil %: 9.4 %
HCT: 29.6 % — ABNORMAL LOW (ref 40.0–52.0)
HGB: 9.6 g/dL — ABNORMAL LOW (ref 13.0–18.0)
LYMPHS ABS: 0.8 10*3/uL — AB (ref 1.0–3.6)
Lymphocyte %: 22.9 %
MCH: 30.8 pg (ref 26.0–34.0)
MCHC: 32.5 g/dL (ref 32.0–36.0)
MCV: 95 fL (ref 80–100)
Monocyte #: 0.6 x10 3/mm (ref 0.2–1.0)
Monocyte %: 16.2 %
NEUTROS PCT: 49.2 %
Neutrophil #: 1.7 10*3/uL (ref 1.4–6.5)
Platelet: 173 10*3/uL (ref 150–440)
RBC: 3.12 10*6/uL — ABNORMAL LOW (ref 4.40–5.90)
RDW: 12.9 % (ref 11.5–14.5)
WBC: 3.4 10*3/uL — ABNORMAL LOW (ref 3.8–10.6)

## 2014-08-13 LAB — BASIC METABOLIC PANEL
Anion Gap: 7 (ref 7–16)
BUN: 51 mg/dL — AB (ref 7–18)
CHLORIDE: 107 mmol/L (ref 98–107)
CREATININE: 1.82 mg/dL — AB (ref 0.60–1.30)
Calcium, Total: 9.7 mg/dL (ref 8.5–10.1)
Co2: 27 mmol/L (ref 21–32)
EGFR (African American): 47 — ABNORMAL LOW
EGFR (Non-African Amer.): 41 — ABNORMAL LOW
Glucose: 59 mg/dL — ABNORMAL LOW (ref 65–99)
OSMOLALITY: 293 (ref 275–301)
Potassium: 5.5 mmol/L — ABNORMAL HIGH (ref 3.5–5.1)
SODIUM: 141 mmol/L (ref 136–145)

## 2014-08-13 LAB — MAGNESIUM: MAGNESIUM: 2.4 mg/dL

## 2014-08-13 LAB — PHOSPHORUS: PHOSPHORUS: 3.8 mg/dL (ref 2.5–4.9)

## 2014-08-28 ENCOUNTER — Encounter: Payer: Self-pay | Admitting: Nephrology

## 2014-08-28 LAB — BASIC METABOLIC PANEL
ANION GAP: 3 — AB (ref 7–16)
BUN: 43 mg/dL — ABNORMAL HIGH (ref 7–18)
CALCIUM: 9.7 mg/dL (ref 8.5–10.1)
Chloride: 107 mmol/L (ref 98–107)
Co2: 29 mmol/L (ref 21–32)
Creatinine: 1.57 mg/dL — ABNORMAL HIGH (ref 0.60–1.30)
GFR CALC AF AMER: 56 — AB
GFR CALC NON AF AMER: 49 — AB
Glucose: 152 mg/dL — ABNORMAL HIGH (ref 65–99)
Osmolality: 291 (ref 275–301)
Potassium: 5.6 mmol/L — ABNORMAL HIGH (ref 3.5–5.1)
SODIUM: 139 mmol/L (ref 136–145)

## 2014-08-28 LAB — CBC WITH DIFFERENTIAL/PLATELET
Basophil #: 0 10*3/uL (ref 0.0–0.1)
Basophil %: 2 %
Eosinophil #: 0.3 10*3/uL (ref 0.0–0.7)
Eosinophil %: 10.4 %
HCT: 30.4 % — ABNORMAL LOW (ref 40.0–52.0)
HGB: 9.7 g/dL — AB (ref 13.0–18.0)
LYMPHS ABS: 0.6 10*3/uL — AB (ref 1.0–3.6)
Lymphocyte %: 22.6 %
MCH: 30.6 pg (ref 26.0–34.0)
MCHC: 32 g/dL (ref 32.0–36.0)
MCV: 96 fL (ref 80–100)
MONOS PCT: 17.9 %
Monocyte #: 0.4 x10 3/mm (ref 0.2–1.0)
NEUTROS ABS: 1.1 10*3/uL — AB (ref 1.4–6.5)
Neutrophil %: 47.1 %
PLATELETS: 171 10*3/uL (ref 150–440)
RBC: 3.17 10*6/uL — ABNORMAL LOW (ref 4.40–5.90)
RDW: 12.9 % (ref 11.5–14.5)
WBC: 2.4 10*3/uL — AB (ref 3.8–10.6)

## 2014-08-28 LAB — MAGNESIUM: Magnesium: 2.2 mg/dL

## 2014-08-28 LAB — PHOSPHORUS: PHOSPHORUS: 3.1 mg/dL (ref 2.5–4.9)

## 2014-09-13 ENCOUNTER — Encounter: Payer: Self-pay | Admitting: Nephrology

## 2014-10-30 ENCOUNTER — Encounter: Payer: Self-pay | Admitting: Nephrology

## 2014-10-30 LAB — CBC WITH DIFFERENTIAL/PLATELET
Basophil #: 0.1 10*3/uL (ref 0.0–0.1)
Basophil %: 2.7 %
Eosinophil #: 0.4 10*3/uL (ref 0.0–0.7)
Eosinophil %: 16.5 %
HCT: 32.9 % — AB (ref 40.0–52.0)
HGB: 10.3 g/dL — ABNORMAL LOW (ref 13.0–18.0)
LYMPHS PCT: 25.2 %
Lymphocyte #: 0.6 10*3/uL — ABNORMAL LOW (ref 1.0–3.6)
MCH: 30.1 pg (ref 26.0–34.0)
MCHC: 31.4 g/dL — ABNORMAL LOW (ref 32.0–36.0)
MCV: 96 fL (ref 80–100)
MONOS PCT: 16.1 %
Monocyte #: 0.4 x10 3/mm (ref 0.2–1.0)
NEUTROS PCT: 39.5 %
Neutrophil #: 0.9 10*3/uL — ABNORMAL LOW (ref 1.4–6.5)
PLATELETS: 159 10*3/uL (ref 150–440)
RBC: 3.42 10*6/uL — ABNORMAL LOW (ref 4.40–5.90)
RDW: 13.3 % (ref 11.5–14.5)
WBC: 2.3 10*3/uL — ABNORMAL LOW (ref 3.8–10.6)

## 2014-10-30 LAB — BASIC METABOLIC PANEL
Anion Gap: 4 — ABNORMAL LOW (ref 7–16)
BUN: 48 mg/dL — ABNORMAL HIGH (ref 7–18)
CALCIUM: 9.5 mg/dL (ref 8.5–10.1)
CHLORIDE: 111 mmol/L — AB (ref 98–107)
CO2: 29 mmol/L (ref 21–32)
CREATININE: 1.57 mg/dL — AB (ref 0.60–1.30)
EGFR (Non-African Amer.): 49 — ABNORMAL LOW
GFR CALC AF AMER: 59 — AB
GLUCOSE: 236 mg/dL — AB (ref 65–99)
Osmolality: 307 (ref 275–301)
POTASSIUM: 5.9 mmol/L — AB (ref 3.5–5.1)
SODIUM: 144 mmol/L (ref 136–145)

## 2014-10-30 LAB — PHOSPHORUS: PHOSPHORUS: 3.4 mg/dL (ref 2.5–4.9)

## 2014-10-30 LAB — MAGNESIUM: MAGNESIUM: 2.3 mg/dL

## 2014-11-05 LAB — BASIC METABOLIC PANEL
Anion Gap: 8 (ref 7–16)
BUN: 47 mg/dL — AB (ref 7–18)
CREATININE: 1.38 mg/dL — AB (ref 0.60–1.30)
Calcium, Total: 9.9 mg/dL (ref 8.5–10.1)
Chloride: 112 mmol/L — ABNORMAL HIGH (ref 98–107)
Co2: 28 mmol/L (ref 21–32)
EGFR (African American): >60
GFR CALC NON AF AMER: 57 — AB
Glucose: 136 mg/dL — ABNORMAL HIGH (ref 65–99)
OSMOLALITY: 309 (ref 275–301)
Potassium: 5.3 mmol/L — ABNORMAL HIGH (ref 3.5–5.1)
Sodium: 148 mmol/L — ABNORMAL HIGH (ref 136–145)

## 2014-11-05 LAB — MAGNESIUM: Magnesium: 2.1 mg/dL

## 2014-11-05 LAB — PHOSPHORUS: Phosphorus: 3.1 mg/dL (ref 2.5–4.9)

## 2014-11-13 ENCOUNTER — Encounter: Payer: Self-pay | Admitting: Nephrology

## 2014-12-11 LAB — MAGNESIUM: Magnesium: 2 mg/dL

## 2014-12-11 LAB — BASIC METABOLIC PANEL
Anion Gap: 5 — ABNORMAL LOW (ref 7–16)
BUN: 43 mg/dL — ABNORMAL HIGH (ref 7–18)
CALCIUM: 10.2 mg/dL — AB (ref 8.5–10.1)
CHLORIDE: 105 mmol/L (ref 98–107)
Co2: 32 mmol/L (ref 21–32)
Creatinine: 1.59 mg/dL — ABNORMAL HIGH (ref 0.60–1.30)
EGFR (African American): 58 — ABNORMAL LOW
EGFR (Non-African Amer.): 48 — ABNORMAL LOW
GLUCOSE: 98 mg/dL (ref 65–99)
OSMOLALITY: 294 (ref 275–301)
Potassium: 4.4 mmol/L (ref 3.5–5.1)
Sodium: 142 mmol/L (ref 136–145)

## 2014-12-11 LAB — LIPID PANEL
CHOLESTEROL: 126 mg/dL (ref 0–200)
HDL: 62 mg/dL — AB (ref 40–60)
Ldl Cholesterol, Calc: 52 mg/dL (ref 0–100)
TRIGLYCERIDES: 59 mg/dL (ref 0–200)
VLDL CHOLESTEROL, CALC: 12 mg/dL (ref 5–40)

## 2014-12-11 LAB — GAMMA GT: GGT: 22 U/L (ref 5–85)

## 2014-12-11 LAB — CBC WITH DIFFERENTIAL/PLATELET
BASOS PCT: 0.8 %
Basophil #: 0 10*3/uL (ref 0.0–0.1)
Eosinophil #: 0.5 10*3/uL (ref 0.0–0.7)
Eosinophil %: 15.5 %
HCT: 37.6 % — AB (ref 40.0–52.0)
HGB: 12 g/dL — AB (ref 13.0–18.0)
Lymphocyte #: 1 10*3/uL (ref 1.0–3.6)
Lymphocyte %: 29.8 %
MCH: 30.1 pg (ref 26.0–34.0)
MCHC: 31.8 g/dL — AB (ref 32.0–36.0)
MCV: 95 fL (ref 80–100)
Monocyte #: 0.4 x10 3/mm (ref 0.2–1.0)
Monocyte %: 12.8 %
NEUTROS PCT: 41.1 %
Neutrophil #: 1.4 10*3/uL (ref 1.4–6.5)
Platelet: 188 10*3/uL (ref 150–440)
RBC: 3.97 10*6/uL — ABNORMAL LOW (ref 4.40–5.90)
RDW: 13.3 % (ref 11.5–14.5)
WBC: 3.4 10*3/uL — ABNORMAL LOW (ref 3.8–10.6)

## 2014-12-11 LAB — SGOT (AST)(ARMC): SGOT(AST): 16 U/L (ref 15–37)

## 2014-12-11 LAB — ALT: ALT: 23 U/L

## 2014-12-11 LAB — PHOSPHORUS: Phosphorus: 3.3 mg/dL (ref 2.5–4.9)

## 2014-12-11 LAB — ALBUMIN: ALBUMIN: 3.8 g/dL (ref 3.4–5.0)

## 2014-12-11 LAB — BILIRUBIN, TOTAL: Bilirubin,Total: 0.4 mg/dL (ref 0.2–1.0)

## 2014-12-11 LAB — ALKALINE PHOSPHATASE: Alkaline Phosphatase: 61 U/L

## 2014-12-14 ENCOUNTER — Encounter: Payer: Self-pay | Admitting: Nephrology

## 2015-01-08 IMAGING — CT CT NECK WITHOUT CONTRAST
1 of 2 series · 10 of 14 positions shown, 13 images · non-contrast
Comparison: None

REASON FOR EXAM: RT SUBMANDIBULAR GLAND MASS
COMMENTS:

PROCEDURE:     BINDEMANN - BINDEMANN NECK WITHOUT CONTRAST  - August 18, 2013  [DATE]
RESULT:     Indication: Right submandibular mass
TECHNIQUE: Multiple sequential axial images from the apices of the lungs to
the level of the orbits obtained with 100 ml Qsovue-JRN IV contrast.

[Series 2: soft tissue · axial · 0.49mm/px · z∈[-184,+47]mm · 10 of 95 slices shown, 13 images]
[im 9/95  soft-tissue]
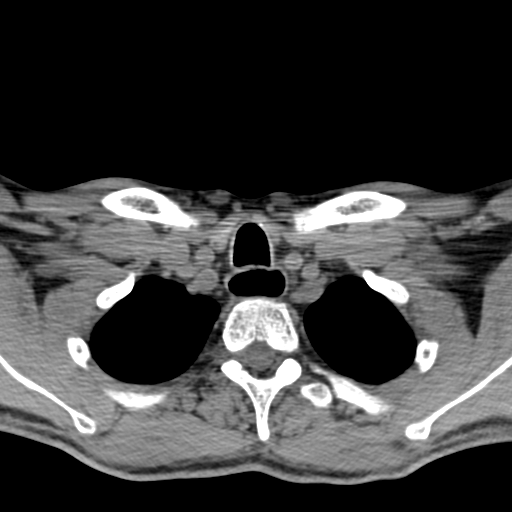
[im 9/95  bone]
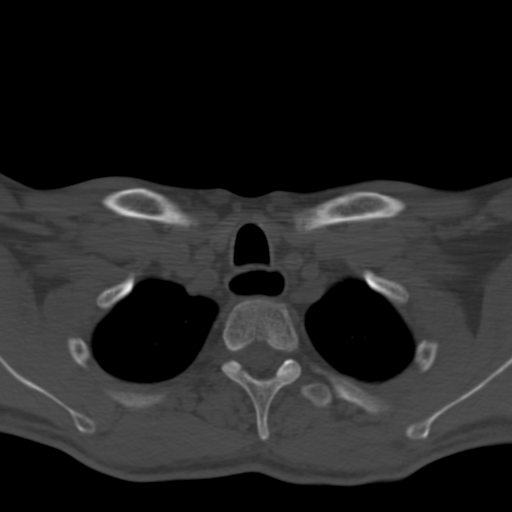
[im 18/95  bone]
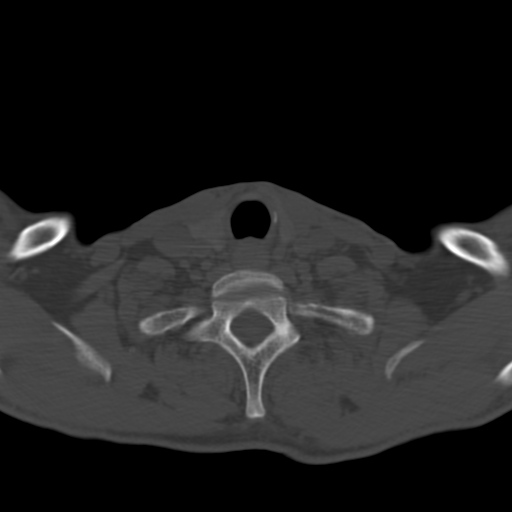
[im 26/95  bone]
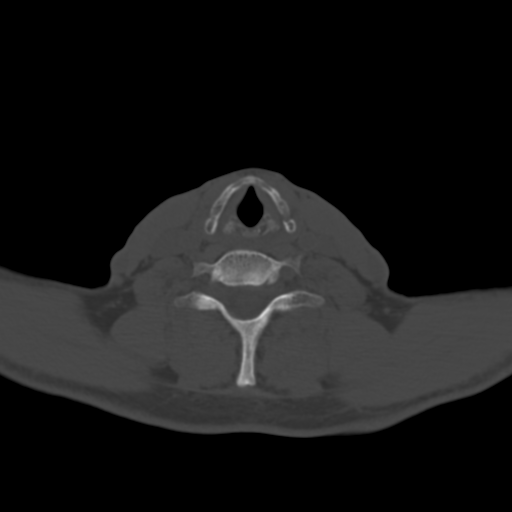
[im 35/95  bone]
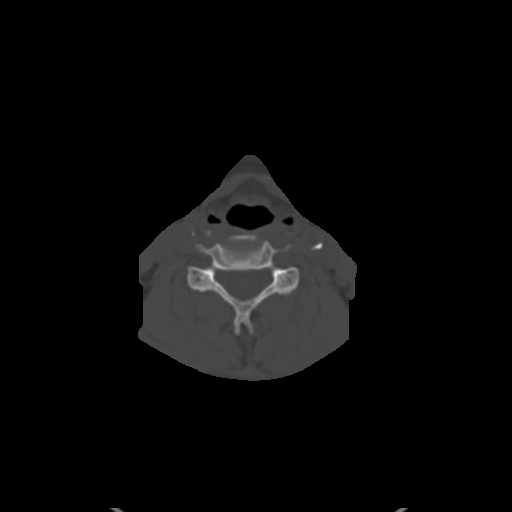
[im 43/95  soft-tissue]
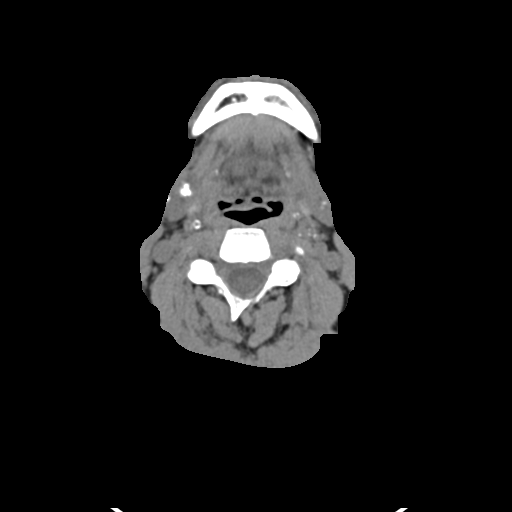
[im 43/95  bone]
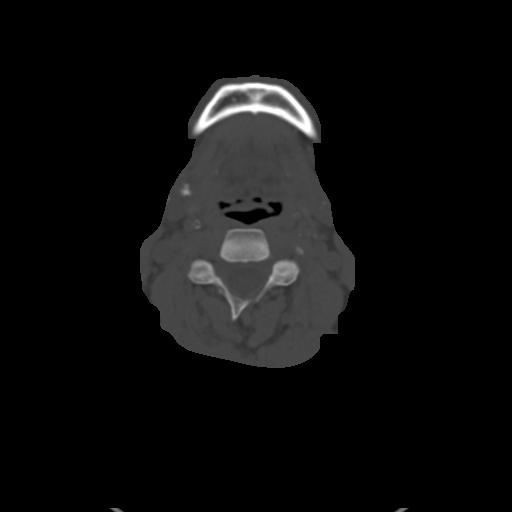
[im 52/95  bone]
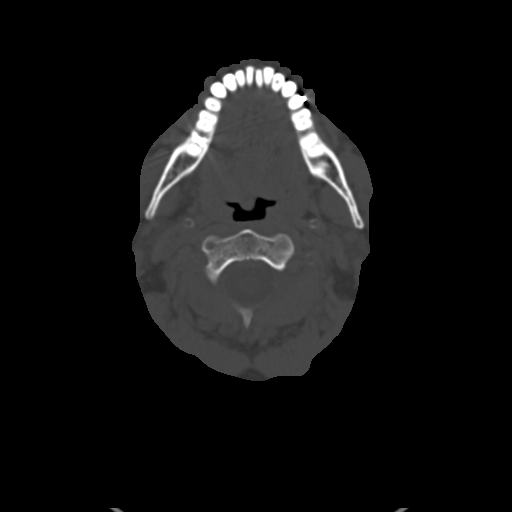
[im 60/95  bone]
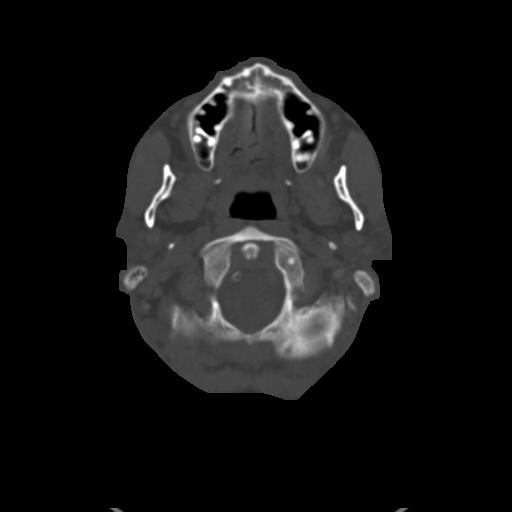
[im 69/95  bone]
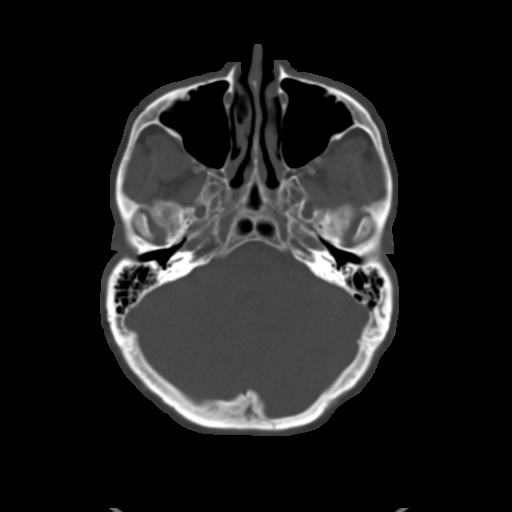
[im 77/95  soft-tissue]
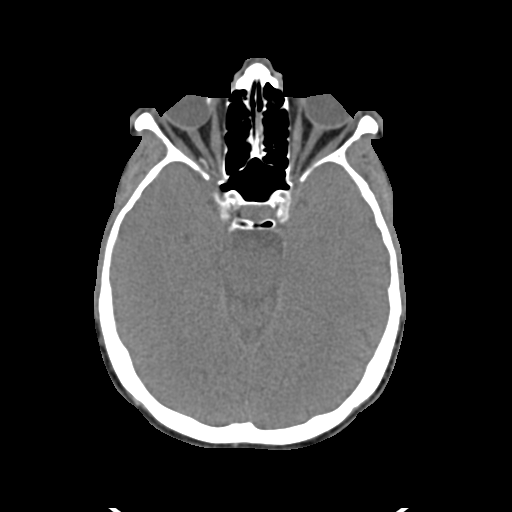
[im 77/95  bone]
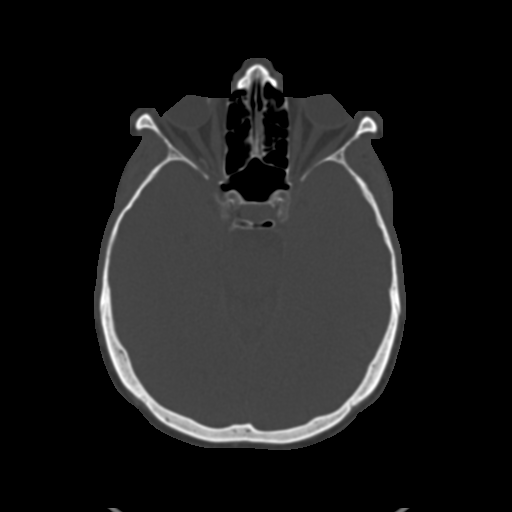
[im 86/95  bone]
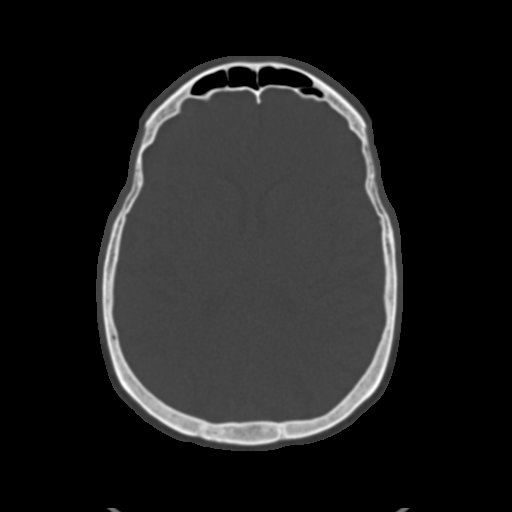

[10 of 14 positions shown; findings below may reference images not displayed]

FINDINGS: There is no lymphadenopathy. There is no nasopharyngeal or
oropharyngeal mass. There is no focal fluid collection. The airway is
patent. The visualized portions of the carotid arteries and internal jugular
veins are patent. There is mild bilateral carotid artery atherosclerosis.

There is a 12 mm calcification within the right submandibular gland most
consistent with a sialolith. There is a left tonsillar calcification noted.

The visualized portions of the brain demonstrate no focal abnormality.

The paranasal sinuses are clear.

There is no lytic or blastic osseous lesion.
IMPRESSION: There is a 12 mm sialolith within the right submandibular gland.

[REDACTED]

## 2015-02-22 ENCOUNTER — Encounter
Admit: 2015-02-22 | Disposition: A | Discharge status: Home / Self Care | Payer: Self-pay | Attending: Nephrology | Admitting: Nephrology

## 2015-02-25 DIAGNOSIS — C44519 Basal cell carcinoma of skin of other part of trunk: Secondary | ICD-10-CM | POA: Insufficient documentation

## 2015-03-15 ENCOUNTER — Encounter
Admit: 2015-03-15 | Disposition: A | Discharge status: Home / Self Care | Payer: Self-pay | Attending: Nephrology | Admitting: Nephrology

## 2015-04-03 LAB — BASIC METABOLIC PANEL
Anion Gap: 7 (ref 7–16)
BUN: 39 mg/dL — ABNORMAL HIGH
CHLORIDE: 107 mmol/L
Calcium, Total: 9.7 mg/dL
Co2: 27 mmol/L
Creatinine: 1.36 mg/dL — ABNORMAL HIGH
EGFR (African American): >60
EGFR (Non-African Amer.): 58 — ABNORMAL LOW
GLUCOSE: 129 mg/dL — AB
Potassium: 4.6 mmol/L
Sodium: 141 mmol/L

## 2015-04-03 LAB — CBC WITH DIFFERENTIAL/PLATELET
Basophil #: 0.1 10*3/uL (ref 0.0–0.1)
Basophil %: 1.5 %
Eosinophil #: 0.5 10*3/uL (ref 0.0–0.7)
Eosinophil %: 13.2 %
HCT: 30.6 % — ABNORMAL LOW (ref 40.0–52.0)
HGB: 9.9 g/dL — ABNORMAL LOW (ref 13.0–18.0)
Lymphocyte #: 0.7 10*3/uL — ABNORMAL LOW (ref 1.0–3.6)
Lymphocyte %: 18 %
MCH: 30.1 pg (ref 26.0–34.0)
MCHC: 32.4 g/dL (ref 32.0–36.0)
MCV: 93 fL (ref 80–100)
MONO ABS: 0.4 x10 3/mm (ref 0.2–1.0)
MONOS PCT: 11.5 %
NEUTROS PCT: 55.8 %
Neutrophil #: 2 10*3/uL (ref 1.4–6.5)
PLATELETS: 157 10*3/uL (ref 150–440)
RBC: 3.3 10*6/uL — AB (ref 4.40–5.90)
RDW: 13.3 % (ref 11.5–14.5)
WBC: 3.6 10*3/uL — AB (ref 3.8–10.6)

## 2015-04-03 LAB — PHOSPHORUS: Phosphorus: 3.3 mg/dL

## 2015-04-03 LAB — HEMOGLOBIN A1C: Hemoglobin A1C: 6.2 % — ABNORMAL HIGH

## 2015-04-03 LAB — MAGNESIUM: MAGNESIUM: 2 mg/dL

## 2015-04-05 NOTE — Op Note (Signed)
PATIENT NAME:  Rick Wang, Rick Wang MR#:  [id number] DATE OF BIRTH:  [birth year]  DATE OF PROCEDURE:  09/06/2013  PREOPERATIVE DIAGNOSIS: Right submandibular sialolithiasis with chronic sialadenitis.   POSTOPERATIVE DIAGNOSIS: Right submandibular sialolithiasis with chronic sialadenitis.   PROCEDURE: Excision of right submandibular gland and stone.   SURGEON: Malon Kindle, MD.   ANESTHESIA: General endotracheal.   INDICATIONS: The patient with intermittent swelling of the right submandibular gland with then associated large stone within the hilum of the gland.   FINDINGS: The gland was inflamed, stuck to surrounding tissues with a 2.5 cm stone within the hilum of the gland.   COMPLICATIONS: None.   DESCRIPTION OF PROCEDURE: After obtaining informed consent, the patient was taken to the operating room and placed in the supine position. After induction of general endotracheal anesthesia, the patient was turned 90 degrees. The right neck was prepped and draped in the usual sterile fashion with the lip exposed for visualization during dissection. The skin was injected with 1% lidocaine with epinephrine 1:200,000 approximately 2 fingerbreadths below the margin of the mandible. A 15 blade was used to incise the skin. The incision was carried down through the platysma using Harmonic scalpel to divide the platysma and associated fascia. Dissection proceeded superiorly towards the inferior aspect of the submandibular gland. The facial vein was identified, clamped and suture ligated and retracted superiorly to help protect the marginal mandibular nerve. The fascia overlying the gland was carefully dissected away bluntly when possible; however, attachments to the gland had to be divided frequently because of the inflamed nature of the gland. The lip was carefully monitored during the entire procedure for any movement during dissection to indicate any proximity to the marginal mandibular nerve. The gland was  carefully dissected out and dissected away from the floor of the neck where there were some adhesions. These were divided with the Harmonic scalpel. Dissection proceeded anteriorly to the mylohyoid muscle and the gland was dissected away from it, which it was again significantly scarred. The superior aspect of the gland was dissected out, again dividing adhesions with the Harmonic scalpel. The facial artery was encountered and I was able to preserve the facial artery just dividing small branches from it into the gland with the Harmonic scalpel. The gland was then swept under the artery and the artery retracted superiorly. The lingual nerve was identified. There was quite a bit of scarring around the lingual nerve and its branch into the gland as this was where the stone was located. The lingual nerve was preserved and I carefully dissected the branch out down into the gland as much as possible before dividing it with the bipolar cautery.   The gland was then further dissected from the floor of the neck watching carefully for the hypoglossal nerve and dissected up under the mylohyoid muscle, at which point a portion of the accessory gland and associated duct were divided and suture ligated with 2-0 silk suture. The gland was delivered and sent for pathology. The stone measured approximately 2.5 cm in greatest dimension. The floor of the neck was inspected and I did carefully dissect down and identified that the hypoglossal nerve was indeed intact. The wound was irrigated with saline and a #10 TLS drain placed and secured to the skin with a 4-0 Vicryl suture. The platysma was closed with interrupted 4-0 Vicryl suture as was the subcutaneous layer. The skin was then closed with a 5-0 Prolene in a running locked stitch. The patient was then returned to the  anesthesiologist for awakening. He was awakened and taken to the recovery room in good condition postoperatively. Blood loss was less than 25 mL.    ____________________________ Sammuel Hines. Richardson Landry, MD psb:aw D: 09/06/2013 10:30:09 ET T: 09/06/2013 10:53:17 ET JOB#: [id number]  cc: Sammuel Hines. Richardson Landry, MD, <Dictator> Riley Nearing MD ELECTRONICALLY SIGNED 09/11/2013 15:06

## 2015-05-09 ENCOUNTER — Other Ambulatory Visit
Admission: RE | Admit: 2015-05-09 | Discharge: 2015-05-09 | Disposition: A | Discharge status: Home / Self Care | Payer: BLUE CROSS/BLUE SHIELD | Source: Ambulatory Visit | Attending: Nephrology | Admitting: Nephrology

## 2015-05-09 DIAGNOSIS — Z940 Kidney transplant status: Secondary | ICD-10-CM | POA: Diagnosis not present

## 2015-05-09 LAB — CBC WITH DIFFERENTIAL/PLATELET
BASOS ABS: 0 10*3/uL (ref 0–0.1)
Basophils Relative: 1 %
EOS ABS: 0.5 10*3/uL (ref 0–0.7)
Eosinophils Relative: 14 %
HEMATOCRIT: 30.4 % — AB (ref 40.0–52.0)
HEMOGLOBIN: 9.9 g/dL — AB (ref 13.0–18.0)
Lymphocytes Relative: 25 %
Lymphs Abs: 0.8 10*3/uL — ABNORMAL LOW (ref 1.0–3.6)
MCH: 30.2 pg (ref 26.0–34.0)
MCHC: 32.4 g/dL (ref 32.0–36.0)
MCV: 93.3 fL (ref 80.0–100.0)
MONOS PCT: 12 %
Monocytes Absolute: 0.4 10*3/uL (ref 0.2–1.0)
NEUTROS PCT: 48 %
Neutro Abs: 1.6 10*3/uL (ref 1.4–6.5)
Platelets: 162 10*3/uL (ref 150–440)
RBC: 3.26 MIL/uL — ABNORMAL LOW (ref 4.40–5.90)
RDW: 13.4 % (ref 11.5–14.5)
WBC: 3.4 10*3/uL — ABNORMAL LOW (ref 3.8–10.6)

## 2015-05-09 LAB — BASIC METABOLIC PANEL
Anion gap: 4 — ABNORMAL LOW (ref 5–15)
BUN: 52 mg/dL — ABNORMAL HIGH (ref 6–20)
CO2: 28 mmol/L (ref 22–32)
CREATININE: 1.56 mg/dL — AB (ref 0.61–1.24)
Calcium: 9.5 mg/dL (ref 8.9–10.3)
Chloride: 110 mmol/L (ref 101–111)
GFR calc non Af Amer: 48 mL/min — ABNORMAL LOW (ref >60)
GFR, EST AFRICAN AMERICAN: 56 mL/min — AB (ref >60)
GLUCOSE: 148 mg/dL — AB (ref 65–99)
Potassium: 5.3 mmol/L — ABNORMAL HIGH (ref 3.5–5.1)
Sodium: 142 mmol/L (ref 135–145)

## 2015-05-09 LAB — MAGNESIUM: Magnesium: 2.2 mg/dL (ref 1.7–2.4)

## 2015-05-09 LAB — PHOSPHORUS: Phosphorus: 3.2 mg/dL (ref 2.5–4.6)

## 2015-07-03 ENCOUNTER — Other Ambulatory Visit
Admission: RE | Admit: 2015-07-03 | Discharge: 2015-07-03 | Disposition: A | Discharge status: Home / Self Care | Payer: BLUE CROSS/BLUE SHIELD | Source: Ambulatory Visit | Attending: Nephrology | Admitting: Nephrology

## 2015-07-03 DIAGNOSIS — Z940 Kidney transplant status: Secondary | ICD-10-CM | POA: Diagnosis not present

## 2015-07-03 LAB — BASIC METABOLIC PANEL
ANION GAP: 8 (ref 5–15)
BUN: 40 mg/dL — AB (ref 6–20)
CO2: 26 mmol/L (ref 22–32)
Calcium: 9.8 mg/dL (ref 8.9–10.3)
Chloride: 106 mmol/L (ref 101–111)
Creatinine, Ser: 1.46 mg/dL — ABNORMAL HIGH (ref 0.61–1.24)
GFR calc Af Amer: >60 mL/min (ref >60)
GFR calc non Af Amer: 52 mL/min — ABNORMAL LOW (ref >60)
Glucose, Bld: 187 mg/dL — ABNORMAL HIGH (ref 65–99)
Potassium: 4.9 mmol/L (ref 3.5–5.1)
SODIUM: 140 mmol/L (ref 135–145)

## 2015-07-03 LAB — CBC WITH DIFFERENTIAL/PLATELET
BASOS ABS: 0.1 10*3/uL (ref 0–0.1)
Basophils Relative: 2 %
Eosinophils Absolute: 0.3 10*3/uL (ref 0–0.7)
Eosinophils Relative: 8 %
HCT: 31 % — ABNORMAL LOW (ref 40.0–52.0)
HEMOGLOBIN: 10 g/dL — AB (ref 13.0–18.0)
Lymphocytes Relative: 21 %
Lymphs Abs: 0.7 10*3/uL — ABNORMAL LOW (ref 1.0–3.6)
MCH: 30.1 pg (ref 26.0–34.0)
MCHC: 32.1 g/dL (ref 32.0–36.0)
MCV: 93.8 fL (ref 80.0–100.0)
MONO ABS: 0.4 10*3/uL (ref 0.2–1.0)
Monocytes Relative: 12 %
NEUTROS ABS: 1.8 10*3/uL (ref 1.4–6.5)
Neutrophils Relative %: 57 %
PLATELETS: 161 10*3/uL (ref 150–440)
RBC: 3.31 MIL/uL — ABNORMAL LOW (ref 4.40–5.90)
RDW: 13.5 % (ref 11.5–14.5)
WBC: 3.2 10*3/uL — AB (ref 3.8–10.6)

## 2015-07-03 LAB — MAGNESIUM: Magnesium: 1.9 mg/dL (ref 1.7–2.4)

## 2015-07-03 LAB — PHOSPHORUS: PHOSPHORUS: 3.4 mg/dL (ref 2.5–4.6)

## 2015-08-30 ENCOUNTER — Other Ambulatory Visit
Admission: RE | Admit: 2015-08-30 | Discharge: 2015-08-30 | Disposition: A | Discharge status: Home / Self Care | Payer: BLUE CROSS/BLUE SHIELD | Source: Ambulatory Visit | Attending: Nephrology | Admitting: Nephrology

## 2015-08-30 DIAGNOSIS — Z114 Encounter for screening for human immunodeficiency virus [HIV]: Secondary | ICD-10-CM | POA: Diagnosis not present

## 2015-08-30 DIAGNOSIS — D899 Disorder involving the immune mechanism, unspecified: Secondary | ICD-10-CM | POA: Insufficient documentation

## 2015-08-30 DIAGNOSIS — Z789 Other specified health status: Secondary | ICD-10-CM | POA: Diagnosis not present

## 2015-08-30 DIAGNOSIS — Z09 Encounter for follow-up examination after completed treatment for conditions other than malignant neoplasm: Secondary | ICD-10-CM | POA: Insufficient documentation

## 2015-08-30 DIAGNOSIS — N189 Chronic kidney disease, unspecified: Secondary | ICD-10-CM | POA: Diagnosis not present

## 2015-08-30 DIAGNOSIS — N390 Urinary tract infection, site not specified: Secondary | ICD-10-CM | POA: Insufficient documentation

## 2015-08-30 DIAGNOSIS — D631 Anemia in chronic kidney disease: Secondary | ICD-10-CM | POA: Diagnosis not present

## 2015-08-30 DIAGNOSIS — E1129 Type 2 diabetes mellitus with other diabetic kidney complication: Secondary | ICD-10-CM | POA: Insufficient documentation

## 2015-08-30 DIAGNOSIS — Z940 Kidney transplant status: Secondary | ICD-10-CM | POA: Diagnosis not present

## 2015-08-30 DIAGNOSIS — E559 Vitamin D deficiency, unspecified: Secondary | ICD-10-CM | POA: Diagnosis not present

## 2015-08-30 DIAGNOSIS — Z79899 Other long term (current) drug therapy: Secondary | ICD-10-CM | POA: Insufficient documentation

## 2015-08-30 LAB — BASIC METABOLIC PANEL
ANION GAP: 7 (ref 5–15)
BUN: 44 mg/dL — ABNORMAL HIGH (ref 6–20)
CALCIUM: 9.9 mg/dL (ref 8.9–10.3)
CHLORIDE: 109 mmol/L (ref 101–111)
CO2: 25 mmol/L (ref 22–32)
Creatinine, Ser: 1.29 mg/dL — ABNORMAL HIGH (ref 0.61–1.24)
GFR calc Af Amer: >60 mL/min (ref >60)
GFR calc non Af Amer: 60 mL/min — ABNORMAL LOW (ref >60)
Glucose, Bld: 129 mg/dL — ABNORMAL HIGH (ref 65–99)
Potassium: 4.9 mmol/L (ref 3.5–5.1)
SODIUM: 141 mmol/L (ref 135–145)

## 2015-08-30 LAB — MAGNESIUM: MAGNESIUM: 1.9 mg/dL (ref 1.7–2.4)

## 2015-08-30 LAB — PHOSPHORUS: Phosphorus: 2.8 mg/dL (ref 2.5–4.6)

## 2015-10-07 ENCOUNTER — Other Ambulatory Visit
Admission: RE | Admit: 2015-10-07 | Discharge: 2015-10-07 | Disposition: A | Discharge status: Home / Self Care | Payer: BLUE CROSS/BLUE SHIELD | Source: Ambulatory Visit | Attending: Nephrology | Admitting: Nephrology

## 2015-10-07 DIAGNOSIS — Z940 Kidney transplant status: Secondary | ICD-10-CM | POA: Insufficient documentation

## 2015-10-07 LAB — BASIC METABOLIC PANEL
ANION GAP: 8 (ref 5–15)
BUN: 42 mg/dL — ABNORMAL HIGH (ref 6–20)
CALCIUM: 10 mg/dL (ref 8.9–10.3)
CO2: 25 mmol/L (ref 22–32)
Chloride: 110 mmol/L (ref 101–111)
Creatinine, Ser: 1.34 mg/dL — ABNORMAL HIGH (ref 0.61–1.24)
GFR calc Af Amer: >60 mL/min (ref >60)
GFR calc non Af Amer: 57 mL/min — ABNORMAL LOW (ref >60)
Glucose, Bld: 103 mg/dL — ABNORMAL HIGH (ref 65–99)
POTASSIUM: 5 mmol/L (ref 3.5–5.1)
SODIUM: 143 mmol/L (ref 135–145)

## 2015-10-07 LAB — MAGNESIUM: Magnesium: 1.9 mg/dL (ref 1.7–2.4)

## 2015-10-07 LAB — PHOSPHORUS: PHOSPHORUS: 2.7 mg/dL (ref 2.5–4.6)

## 2015-10-22 ENCOUNTER — Other Ambulatory Visit: Payer: Self-pay | Admitting: Family Medicine

## 2015-10-23 NOTE — Telephone Encounter (Signed)
Dr Rosanna Randy, this is a request for Novolog.  We do not follow his DM with A1C.  So you want to refill or shouldn't he be getting it from [facility] following it? Thanks

## 2015-12-02 ENCOUNTER — Other Ambulatory Visit
Admission: RE | Admit: 2015-12-02 | Discharge: 2015-12-02 | Disposition: A | Discharge status: Home / Self Care | Payer: BLUE CROSS/BLUE SHIELD | Source: Ambulatory Visit | Attending: Nephrology | Admitting: Nephrology

## 2015-12-02 DIAGNOSIS — Z79899 Other long term (current) drug therapy: Secondary | ICD-10-CM | POA: Insufficient documentation

## 2015-12-02 DIAGNOSIS — D899 Disorder involving the immune mechanism, unspecified: Secondary | ICD-10-CM | POA: Diagnosis present

## 2015-12-02 DIAGNOSIS — Z940 Kidney transplant status: Secondary | ICD-10-CM | POA: Diagnosis not present

## 2015-12-02 DIAGNOSIS — N390 Urinary tract infection, site not specified: Secondary | ICD-10-CM | POA: Diagnosis not present

## 2015-12-02 DIAGNOSIS — E1129 Type 2 diabetes mellitus with other diabetic kidney complication: Secondary | ICD-10-CM | POA: Insufficient documentation

## 2015-12-02 DIAGNOSIS — D631 Anemia in chronic kidney disease: Secondary | ICD-10-CM | POA: Diagnosis not present

## 2015-12-02 DIAGNOSIS — T8610 Unspecified complication of kidney transplant: Secondary | ICD-10-CM | POA: Insufficient documentation

## 2015-12-02 DIAGNOSIS — Z09 Encounter for follow-up examination after completed treatment for conditions other than malignant neoplasm: Secondary | ICD-10-CM | POA: Insufficient documentation

## 2015-12-02 DIAGNOSIS — E559 Vitamin D deficiency, unspecified: Secondary | ICD-10-CM | POA: Insufficient documentation

## 2015-12-02 DIAGNOSIS — Z789 Other specified health status: Secondary | ICD-10-CM | POA: Diagnosis not present

## 2015-12-02 DIAGNOSIS — Z114 Encounter for screening for human immunodeficiency virus [HIV]: Secondary | ICD-10-CM | POA: Insufficient documentation

## 2015-12-02 LAB — PHOSPHORUS: Phosphorus: 3.3 mg/dL (ref 2.5–4.6)

## 2015-12-02 LAB — MAGNESIUM: Magnesium: 1.8 mg/dL (ref 1.7–2.4)

## 2015-12-02 LAB — BASIC METABOLIC PANEL
Anion gap: 4 — ABNORMAL LOW (ref 5–15)
BUN: 36 mg/dL — ABNORMAL HIGH (ref 6–20)
CHLORIDE: 110 mmol/L (ref 101–111)
CO2: 28 mmol/L (ref 22–32)
Calcium: 9.7 mg/dL (ref 8.9–10.3)
Creatinine, Ser: 1.21 mg/dL (ref 0.61–1.24)
GFR calc non Af Amer: >60 mL/min (ref >60)
Glucose, Bld: 72 mg/dL (ref 65–99)
Potassium: 5.1 mmol/L (ref 3.5–5.1)
Sodium: 142 mmol/L (ref 135–145)

## 2016-01-13 ENCOUNTER — Other Ambulatory Visit
Admission: RE | Admit: 2016-01-13 | Discharge: 2016-01-13 | Disposition: A | Discharge status: Home / Self Care | Payer: BLUE CROSS/BLUE SHIELD | Source: Ambulatory Visit | Attending: Nephrology | Admitting: Nephrology

## 2016-01-13 DIAGNOSIS — E1129 Type 2 diabetes mellitus with other diabetic kidney complication: Secondary | ICD-10-CM | POA: Insufficient documentation

## 2016-01-13 DIAGNOSIS — E559 Vitamin D deficiency, unspecified: Secondary | ICD-10-CM | POA: Diagnosis not present

## 2016-01-13 DIAGNOSIS — Z940 Kidney transplant status: Secondary | ICD-10-CM | POA: Insufficient documentation

## 2016-01-13 DIAGNOSIS — D899 Disorder involving the immune mechanism, unspecified: Secondary | ICD-10-CM | POA: Insufficient documentation

## 2016-01-13 DIAGNOSIS — D631 Anemia in chronic kidney disease: Secondary | ICD-10-CM | POA: Insufficient documentation

## 2016-01-13 DIAGNOSIS — Z79899 Other long term (current) drug therapy: Secondary | ICD-10-CM | POA: Insufficient documentation

## 2016-01-13 LAB — BASIC METABOLIC PANEL
ANION GAP: 6 (ref 5–15)
BUN: 43 mg/dL — ABNORMAL HIGH (ref 6–20)
CHLORIDE: 106 mmol/L (ref 101–111)
CO2: 25 mmol/L (ref 22–32)
Calcium: 9.2 mg/dL (ref 8.9–10.3)
Creatinine, Ser: 1.3 mg/dL — ABNORMAL HIGH (ref 0.61–1.24)
GFR calc Af Amer: >60 mL/min (ref >60)
GFR calc non Af Amer: 59 mL/min — ABNORMAL LOW (ref >60)
Glucose, Bld: 162 mg/dL — ABNORMAL HIGH (ref 65–99)
POTASSIUM: 4.6 mmol/L (ref 3.5–5.1)
Sodium: 137 mmol/L (ref 135–145)

## 2016-01-13 LAB — PHOSPHORUS: Phosphorus: 3.1 mg/dL (ref 2.5–4.6)

## 2016-01-13 LAB — MAGNESIUM: MAGNESIUM: 1.8 mg/dL (ref 1.7–2.4)

## 2016-02-03 ENCOUNTER — Encounter: Payer: Self-pay | Admitting: Family Medicine

## 2016-02-03 ENCOUNTER — Ambulatory Visit (INDEPENDENT_AMBULATORY_CARE_PROVIDER_SITE_OTHER): Payer: BLUE CROSS/BLUE SHIELD | Admitting: Family Medicine

## 2016-02-03 VITALS — BP 134/62 | HR 72 | Temp 97.8°F | Resp 14 | Ht 70.0 in | Wt 152.0 lb

## 2016-02-03 DIAGNOSIS — H409 Unspecified glaucoma: Secondary | ICD-10-CM | POA: Insufficient documentation

## 2016-02-03 DIAGNOSIS — E785 Hyperlipidemia, unspecified: Secondary | ICD-10-CM | POA: Insufficient documentation

## 2016-02-03 DIAGNOSIS — R55 Syncope and collapse: Secondary | ICD-10-CM | POA: Diagnosis not present

## 2016-02-03 DIAGNOSIS — E1022 Type 1 diabetes mellitus with diabetic chronic kidney disease: Secondary | ICD-10-CM | POA: Diagnosis not present

## 2016-02-03 DIAGNOSIS — N529 Male erectile dysfunction, unspecified: Secondary | ICD-10-CM | POA: Insufficient documentation

## 2016-02-03 DIAGNOSIS — E162 Hypoglycemia, unspecified: Secondary | ICD-10-CM

## 2016-02-03 DIAGNOSIS — E13319 Other specified diabetes mellitus with unspecified diabetic retinopathy without macular edema: Secondary | ICD-10-CM | POA: Diagnosis not present

## 2016-02-03 DIAGNOSIS — I10 Essential (primary) hypertension: Secondary | ICD-10-CM

## 2016-02-03 DIAGNOSIS — N184 Chronic kidney disease, stage 4 (severe): Secondary | ICD-10-CM

## 2016-02-03 DIAGNOSIS — Z940 Kidney transplant status: Secondary | ICD-10-CM

## 2016-02-03 DIAGNOSIS — E109 Type 1 diabetes mellitus without complications: Secondary | ICD-10-CM | POA: Insufficient documentation

## 2016-02-03 DIAGNOSIS — N186 End stage renal disease: Secondary | ICD-10-CM | POA: Insufficient documentation

## 2016-02-03 DIAGNOSIS — E11319 Type 2 diabetes mellitus with unspecified diabetic retinopathy without macular edema: Secondary | ICD-10-CM | POA: Insufficient documentation

## 2016-02-03 DIAGNOSIS — N19 Unspecified kidney failure: Secondary | ICD-10-CM | POA: Insufficient documentation

## 2016-02-03 LAB — POCT URINALYSIS DIPSTICK
BILIRUBIN UA: NEGATIVE
GLUCOSE UA: NEGATIVE
Ketones, UA: NEGATIVE
LEUKOCYTES UA: NEGATIVE
NITRITE UA: NEGATIVE
PH UA: 5
Protein, UA: NEGATIVE
RBC UA: NEGATIVE
Spec Grav, UA: 1.015
UROBILINOGEN UA: NEGATIVE

## 2016-02-03 NOTE — Progress Notes (Signed)
Patient ID: Rick Wang, male   DOB: 02-26-1956, 60 y.o.   MRN: [id number]    Subjective:  HPI  Patient is here to discuss syncope due to hypoglycemia.  Patient has been following [facility] transplant department for his routine health issues and physical exam. He also sees Dr. Eddie Dibbles for diabetes. He could not get in touch with Dr. Eddie Dibbles to discuss this today because the doctor is only in the office from Tuesday through Thursday. The syncope happened twice at work and they did not want patient to wait till Tuesday and wanted him to be seen today. He has had 2 syncope episodes in the past 1 and a half weeks. The ifrst time that morning his sugar was 159 and shortly after been at work and EMS was called sugar was 20. This morning was the second episode and sugar this morning was 89 so he made sure to eat and when he got to work he started to feel bad again and was on the way to get more glucose tablets and passed out and when sugar was checked it was 32. He is not sure why sugar is dropping so fast. He states last A1C was 6.1 in October. He uses Toujeo 19 units and Novolog sliding scale.  Prior to Admission medications   Medication Sig Start Date End Date Taking? Authorizing Provider  aspirin 81 MG tablet Take by mouth. 08/08/13  Yes Historical Provider, MD  atorvastatin (LIPITOR) 20 MG tablet Take by mouth. 08/08/13  Yes Historical Provider, MD  Brinzolamide-Brimonidine [facility]) 1-0.2 % SUSP Apply to eye. 08/08/13  Yes Historical Provider, MD  cinacalcet (SENSIPAR) 60 MG tablet Take 60 mg by mouth. 08/15/15 08/14/16 Yes Historical Provider, MD  docusate sodium (COLACE) 100 MG capsule Take by mouth. 08/08/13  Yes Historical Provider, MD  ferrous sulfate 325 (65 FE) MG tablet Take by mouth. 08/08/13  Yes Historical Provider, MD  furosemide (LASIX) 20 MG tablet Take 20 mg by mouth. 07/16/15 07/15/16 Yes Historical Provider, MD  Insulin Glargine (TOUJEO SOLOSTAR) 300 UNIT/ML SOPN Inject into the skin.   Yes  Historical Provider, MD  Insulin Syringe-Needle U-100 (BD INSULIN SYRINGE ULTRAFINE) 31G X 5/16" 0.5 ML MISC  10/09/13  Yes Historical Provider, MD  lisinopril (PRINIVIL,ZESTRIL) 40 MG tablet Take 40 mg by mouth. 08/15/15 08/14/16 Yes Historical Provider, MD  mycophenolate (MYFORTIC) 180 MG EC tablet Take by mouth. 08/08/13  Yes Historical Provider, MD  NOVOLOG 100 UNIT/ML injection INJECT 7 UNITS UNDER THE SKIN BEFORE BREAKFAST, 5 UNITS BEFORE LUNCH AND 4 UNITS BEFORE DINNER 10/23/15  Yes Florine Sprenkle Maceo Pro., MD  [facility] DELICA LANCETS FINE MISC  10/09/13  Yes Historical Provider, MD  Polyethylene Glycol 3350 GRAN Take by mouth. 08/08/13  Yes Historical Provider, MD  tacrolimus (PROGRAF) 1 MG capsule  05/10/15  Yes Historical Provider, MD    Patient Active Problem List   Diagnosis Date Noted  . Type I diabetes mellitus ([place]) 02/03/2016  . Diabetic retinopathy ([place]) 02/03/2016  . 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 End-stage renal disease ([place]) 02/03/2016  . Glaucoma 02/03/2016  . HLD (hyperlipidemia) 02/03/2016  . Kidney failure 02/03/2016  . Basal cell carcinoma of back 02/25/2015  . H/O thrombosis 02/12/2012  . BP (high blood pressure) 01/13/2011  . H/O kidney transplant 07/08/2010    No past medical history on file.  Social History   Social History  . Marital Status: Married    Spouse Name: N/A  . Number of  Children: N/A  . Years of Education: N/A   Occupational History  . Not on file.   Social History Main Topics  . Smoking status: Not on file  . Smokeless tobacco: Not on file  . Alcohol Use: Not on file  . Drug Use: Not on file  . Sexual Activity: Not on file   Other Topics Concern  . Not on file   Social History Narrative  . No narrative on file    No Known Allergies  Review of Systems  Constitutional: Negative.   HENT: Negative.   Eyes: Negative.   Respiratory: Negative.   Cardiovascular: Negative.   Skin: Negative.     Endo/Heme/Allergies: Negative.   Psychiatric/Behavioral: Negative.     Immunization History  Administered Date(s) Administered  . Influenza-Unspecified 09/14/2015   Objective:  BP 134/62 mmHg  Pulse 72  Temp(Src) 97.8 F (36.6 C)  Resp 14  Wt 152 lb (68.947 kg)  Physical Exam  Constitutional: He is oriented to person, place, and time and well-developed, well-nourished, and in no distress.  HENT:  Head: Normocephalic and atraumatic.  Eyes: Conjunctivae are normal. Pupils are equal, round, and reactive to light.  Neck: Normal range of motion. Neck supple.  Cardiovascular: Normal rate, regular rhythm, normal heart sounds and intact distal pulses.   No murmur heard. Pulmonary/Chest: Effort normal and breath sounds normal. No respiratory distress. He has no wheezes.  Abdominal: Soft.  Musculoskeletal: Normal range of motion. He exhibits no edema or tenderness.  Neurological: He is alert and oriented to person, place, and time. He has normal reflexes. He displays normal reflexes. No cranial nerve deficit. He exhibits normal muscle tone. Gait normal. Coordination normal.  Skin: Skin is warm and dry.  Psychiatric: Mood, memory, affect and judgment normal.    Lab Results  Component Value Date   WBC 3.2* 07/03/2015   HGB 10.0* 07/03/2015   HCT 31.0* 07/03/2015   PLT 161 07/03/2015   GLUCOSE 162* 01/13/2016   CHOL 126 12/11/2014   TRIG 59 12/11/2014   HDL 62* 12/11/2014   LDLCALC 52 12/11/2014   INR 0.9 06/20/2012   HGBA1C 6.2* 04/03/2015    CMP     Component Value Date/Time   NA 137 01/13/2016 0846   NA 141 04/03/2015 0836   K 4.6 01/13/2016 0846   K 4.6 04/03/2015 0836   CL 106 01/13/2016 0846   CL 107 04/03/2015 0836   CO2 25 01/13/2016 0846   CO2 27 04/03/2015 0836   GLUCOSE 162* 01/13/2016 0846   GLUCOSE 129* 04/03/2015 0836   BUN 43* 01/13/2016 0846   BUN 39* 04/03/2015 0836   CREATININE 1.30* 01/13/2016 0846   CREATININE 1.36* 04/03/2015 0836   CALCIUM  9.2 01/13/2016 0846   CALCIUM 9.7 04/03/2015 0836   PROT 6.9 07/12/2013 0855   ALBUMIN 3.8 12/11/2014 0842   AST 16 12/11/2014 0842   ALT 23 12/11/2014 0842   ALKPHOS 61 12/11/2014 0842   BILITOT 0.4 12/11/2014 0842   GFRNONAA 59* 01/13/2016 0846   GFRNONAA 58* 04/03/2015 0836   GFRNONAA 48* 12/11/2014 0842   GFRAA >60 01/13/2016 0846   GFRAA >60 04/03/2015 0836   GFRAA 58* 12/11/2014 0842    Assessment and Plan :  1. Syncope and collapse 2 times in 2 weeks. UA normal today. EKG is stable today. Neurological exam is normal today. - EKG 12-Lead - POCT urinalysis dipstick  2. Hypoglycemia Stable right now. Patient follows Dr. Eddie Dibbles for diabetes. Patient advised to contact  his doctor and see what adjustments patient should make. Today will advise patient to decrease Toujeo to 16 units instead of 19 units.  3. 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 Essential hypertension Stable.  5. HLD (hyperlipidemia) Follows [facility] for labs.  6. Diabetic retinopathy associated with diabetes mellitus of other type ([place])  7. H/O kidney transplant Follows [facility]. I have done the exam and reviewed the above chart and it is accurate to the best of my knowledge.   Patient was seen and examined by Dr. Eulas Post and note was scribed by Theressa Millard, RMA.    Miguel Aschoff MD [facility] 02/03/2016 2:42 PM

## 2016-02-04 ENCOUNTER — Ambulatory Visit: Payer: Self-pay | Admitting: Family Medicine

## 2016-02-13 ENCOUNTER — Encounter: Payer: Self-pay | Admitting: Family Medicine

## 2016-02-13 ENCOUNTER — Ambulatory Visit (INDEPENDENT_AMBULATORY_CARE_PROVIDER_SITE_OTHER): Payer: BLUE CROSS/BLUE SHIELD | Admitting: Family Medicine

## 2016-02-13 VITALS — BP 128/60 | HR 66 | Temp 98.0°F | Resp 16 | Ht 70.0 in | Wt 150.0 lb

## 2016-02-13 DIAGNOSIS — Z0000 Encounter for general adult medical examination without abnormal findings: Secondary | ICD-10-CM | POA: Diagnosis not present

## 2016-02-13 DIAGNOSIS — Z125 Encounter for screening for malignant neoplasm of prostate: Secondary | ICD-10-CM

## 2016-02-13 DIAGNOSIS — Z1211 Encounter for screening for malignant neoplasm of colon: Secondary | ICD-10-CM

## 2016-02-13 NOTE — Progress Notes (Signed)
Patient ID: Rick Wang, male   DOB: 1956-06-02, 60 y.o.   MRN: [id number]       Patient: Rick Wang, Male    DOB: November 24, 1956, 60 y.o.   MRN: [id number] Visit Date: 02/13/2016  Today's Provider: Wilhemena Durie, MD   Chief Complaint  Patient presents with  . Annual Exam   Subjective:    Annual physical exam Kevinmichael Deboise is a 60 y.o. male who presents today for health maintenance and complete physical. He feels fairly well. He reports no formal exercise but he has a active job . He reports he is sleeping fairly well.  -----------------------------------------------------------------  Colonoscopy- 07/18/08 Normal  Immunization History  Administered Date(s) Administered  . Influenza-Unspecified 09/14/2015  . Td 05/22/1998  . Tdap 06/18/2008       Review of Systems  Constitutional: Negative.   HENT: Negative.   Eyes: Negative.   Respiratory: Negative.   Cardiovascular: Negative.   Gastrointestinal: Negative.   Endocrine: Negative.   Genitourinary: Negative.   Musculoskeletal: Negative.   Skin: Negative.   Allergic/Immunologic: Negative.   Neurological: Negative.   Hematological: Negative.   Psychiatric/Behavioral: Negative.     Social History      He  reports that he has never smoked. He has never used smokeless tobacco. He reports that he does not drink alcohol or use illicit drugs.       Social History   Social History  . Marital Status: Married    Spouse Name: N/A  . Number of Children: N/A  . Years of Education: N/A   Social History Main Topics  . Smoking status: Never Smoker   . Smokeless tobacco: Never Used  . Alcohol Use: No  . Drug Use: No  . Sexual Activity: Not Asked   Other Topics Concern  . None   Social History Narrative    History reviewed. No pertinent past medical history.   Patient Active Problem List   Diagnosis Date Noted  . Type I diabetes mellitus ([place]) 02/03/2016  . Diabetic retinopathy ([place]) 02/03/2016    . 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 End-stage renal disease ([place]) 02/03/2016  . Glaucoma 02/03/2016  . HLD (hyperlipidemia) 02/03/2016  . Kidney failure 02/03/2016  . Basal cell carcinoma of back 02/25/2015  . H/O thrombosis 02/12/2012  . BP (high blood pressure) 01/13/2011  . H/O kidney transplant 07/08/2010    Past Surgical History  Procedure Laterality Date  . Cataract extraction    . Hernia repair  0000000    umbilical  . Peritoneal catheter insertion    . Transplantation renal      Family History        Family Status  Relation Status Death Age  . Mother Alive   . Father Alive   . Sister Alive   . Brother Alive   . Son Alive         His family history includes Congestive Heart Failure in his mother; Diabetes in his father and mother; Kidney disease in his father; Prostate cancer in his father.    No Known Allergies  Previous Medications   ASPIRIN 81 MG TABLET    Take by mouth.   ATORVASTATIN (LIPITOR) 20 MG TABLET    Take by mouth.   BRINZOLAMIDE-BRIMONIDINE (SIMBRINZA) 1-0.2 % SUSP    Apply to eye.   CINACALCET (SENSIPAR) 60 MG TABLET    Take 60 mg by mouth.   DOCUSATE SODIUM (COLACE) 100 MG CAPSULE  Take by mouth.   FERROUS SULFATE 325 (65 FE) MG TABLET    Take by mouth.   FUROSEMIDE (LASIX) 20 MG TABLET    Take 20 mg by mouth.   INSULIN GLARGINE (TOUJEO SOLOSTAR) 300 UNIT/ML SOPN    Inject 16 Units into the skin daily.    INSULIN SYRINGE-NEEDLE U-100 (BD INSULIN SYRINGE ULTRAFINE) 31G X 5/16" 0.5 ML MISC       LISINOPRIL (PRINIVIL,ZESTRIL) 40 MG TABLET    Take 40 mg by mouth.   MYCOPHENOLATE (MYFORTIC) 180 MG EC TABLET    Take by mouth.   NOVOLOG 100 UNIT/ML INJECTION    INJECT 7 UNITS UNDER THE SKIN BEFORE BREAKFAST, 5 UNITS BEFORE LUNCH AND 4 UNITS BEFORE DINNER   ONETOUCH DELICA LANCETS FINE MISC       POLYETHYLENE GLYCOL 3350 GRAN    Take by mouth.   TACROLIMUS (PROGRAF) 1 MG CAPSULE        Patient Care Team: Jerrol Banana., MD as PCP - General (Family Medicine)     Objective:   Vitals: There were no vitals taken for this visit.   Physical Exam  Constitutional: He is oriented to person, place, and time. He appears well-developed and well-nourished.  HENT:  Head: Normocephalic and atraumatic.  Right Ear: External ear normal.  Left Ear: External ear normal.  Nose: Nose normal.  Mouth/Throat: Oropharynx is clear and moist.  Eyes: Conjunctivae and EOM are normal. Pupils are equal, round, and reactive to light.  Neck: Normal range of motion. Neck supple.  Cardiovascular: Normal rate, regular rhythm, normal heart sounds and intact distal pulses.   Soft 2/6 systolic murmur noted, right upper sternal border.  Pulmonary/Chest: Effort normal and breath sounds normal.  Abdominal: Soft. Bowel sounds are normal.  Genitourinary: Rectum normal, prostate normal and penis normal.  Musculoskeletal: Normal range of motion.  Neurological: He is alert and oriented to person, place, and time. He has normal reflexes.  Skin: Skin is warm and dry.  Psychiatric: He has a normal mood and affect. His behavior is normal. Judgment and thought content normal.     Depression Screen PHQ 2/9 Scores 02/03/2016  PHQ - 2 Score 0      Assessment & Plan:     Routine Health Maintenance and Physical Exam  Exercise Activities and Dietary recommendations Goals    None      Immunization History  Administered Date(s) Administered  . Influenza-Unspecified 09/14/2015  . Td 05/22/1998  . Tdap 06/18/2008    Health Maintenance  Topic Date Due  . Hepatitis C Screening  [birth year]  . PNEUMOCOCCAL POLYSACCHARIDE VACCINE (1) 07/09/1960  . FOOT EXAM  07/09/1968  . OPHTHALMOLOGY EXAM  07/09/1968  . HIV Screening  07/09/1973  . COLONOSCOPY  07/09/2008  . HEMOGLOBIN A1C  10/03/2015  . INFLUENZA VACCINE  02/02/2017 (Originally 07/14/2016)  . TETANUS/TDAP  06/18/2018     Zostavax at age 45 if clear by transplant  team Discussed health benefits of physical activity, and encouraged him to engage in regular exercise appropriate for his age and condition.   type 1 diabetes since age 40 Last A1c was 6.9.  Status post renal transplant for renal nephropathy Hypertension Hyperlipidemia I have done the exam and reviewed the above chart and it is accurate to the best of my knowledge.  --------------------------------------------------------------------

## 2016-02-14 ENCOUNTER — Telehealth: Payer: Self-pay | Admitting: Family Medicine

## 2016-02-19 LAB — TSH: TSH: 1.83 u[IU]/mL (ref 0.450–4.500)

## 2016-02-19 LAB — HEPATIC FUNCTION PANEL
ALK PHOS: 60 IU/L (ref 39–117)
ALT: 19 IU/L (ref 0–44)
AST: 22 IU/L (ref 0–40)
Albumin: 4.2 g/dL (ref 3.5–5.5)
BILIRUBIN, DIRECT: 0.21 mg/dL (ref 0.00–0.40)
Bilirubin Total: 0.6 mg/dL (ref 0.0–1.2)
Total Protein: 6.5 g/dL (ref 6.0–8.5)

## 2016-02-19 LAB — LIPID PANEL WITH LDL/HDL RATIO
Cholesterol, Total: 108 mg/dL (ref 100–199)
HDL: 58 mg/dL (ref >39)
LDL Calculated: 39 mg/dL (ref 0–99)
LDL/HDL RATIO: 0.7 ratio (ref 0.0–3.6)
TRIGLYCERIDES: 54 mg/dL (ref 0–149)
VLDL CHOLESTEROL CAL: 11 mg/dL (ref 5–40)

## 2016-02-19 LAB — PSA: PROSTATE SPECIFIC AG, SERUM: 1.1 ng/mL (ref 0.0–4.0)

## 2016-02-21 ENCOUNTER — Other Ambulatory Visit: Payer: Self-pay

## 2016-02-21 ENCOUNTER — Telehealth: Payer: Self-pay

## 2016-02-21 NOTE — Telephone Encounter (Signed)
Gastroenterology Pre-Procedure Review  Request Date: 04/03/16 Requesting Physician: Dr. Rosanna Randy  PATIENT REVIEW QUESTIONS: The patient responded to the following health history questions as indicated:    1. Are you having any GI issues? no 2. Do you have a personal history of Polyps? no 3. Do you have a family history of Colon Cancer or Polyps? no 4. Diabetes Mellitus? yes (Type 1) 5. Joint replacements in the past 12 months?no 6. Major health problems in the past 3 months?no 7. Any artificial heart valves, MVP, or defibrillator?no    MEDICATIONS & ALLERGIES:    Patient reports the following regarding taking any anticoagulation/antiplatelet therapy:   Plavix, Coumadin, Eliquis, Xarelto, Lovenox, Pradaxa, Brilinta, or Effient? no Aspirin? yes (ASA 81mg )  Patient confirms/reports the following medications:  Current Outpatient Prescriptions  Medication Sig Dispense Refill  . aspirin 81 MG tablet Take by mouth.    Marland Kitchen atorvastatin (LIPITOR) 20 MG tablet Take by mouth.    . Brinzolamide-Brimonidine (SIMBRINZA) 1-0.2 % SUSP Apply to eye.    . cinacalcet (SENSIPAR) 60 MG tablet Take 60 mg by mouth.    . docusate sodium (COLACE) 100 MG capsule Take by mouth.    . ferrous sulfate 325 (65 FE) MG tablet Take by mouth.    . furosemide (LASIX) 20 MG tablet Take 20 mg by mouth.    . Insulin Glargine (TOUJEO SOLOSTAR) 300 UNIT/ML SOPN Inject 16 Units into the skin daily.     . Insulin Syringe-Needle U-100 (BD INSULIN SYRINGE ULTRAFINE) 31G X 5/16" 0.5 ML MISC     . lisinopril (PRINIVIL,ZESTRIL) 40 MG tablet Take 40 mg by mouth.    . mycophenolate (MYFORTIC) 180 MG EC tablet Take by mouth.    Marland Kitchen NOVOLOG 100 UNIT/ML injection INJECT 7 UNITS UNDER THE SKIN BEFORE BREAKFAST, 5 UNITS BEFORE LUNCH AND 4 UNITS BEFORE DINNER 30 mL 2  . ONETOUCH DELICA LANCETS FINE MISC     . Polyethylene Glycol 3350 GRAN Take by mouth.    . tacrolimus (PROGRAF) 1 MG capsule      No current facility-administered  medications for this visit.    Patient confirms/reports the following allergies:  No Known Allergies  No orders of the defined types were placed in this encounter.    AUTHORIZATION INFORMATION Primary Insurance: 1D#: Group #:  Secondary Insurance: 1D#: Group #:  SCHEDULE INFORMATION: Date: 04/03/16 Time: Location: [place]

## 2016-02-26 ENCOUNTER — Telehealth: Payer: Self-pay

## 2016-02-26 NOTE — Telephone Encounter (Signed)
-----   Message from Jerrol Banana., MD sent at 02/25/2016 10:13 AM EDT ----- Labs okay.

## 2016-02-26 NOTE — Telephone Encounter (Signed)
Left message to call back  

## 2016-02-28 NOTE — Telephone Encounter (Signed)
[id number] 

## 2016-03-02 NOTE — Telephone Encounter (Signed)
Letter mailed to home address with results-aa

## 2016-03-06 NOTE — Telephone Encounter (Signed)
Pt informed of lab results. 

## 2016-03-18 ENCOUNTER — Other Ambulatory Visit
Admission: RE | Admit: 2016-03-18 | Discharge: 2016-03-18 | Disposition: A | Discharge status: Home / Self Care | Payer: BLUE CROSS/BLUE SHIELD | Source: Ambulatory Visit | Attending: Nephrology | Admitting: Nephrology

## 2016-03-18 DIAGNOSIS — D899 Disorder involving the immune mechanism, unspecified: Secondary | ICD-10-CM | POA: Diagnosis not present

## 2016-03-18 DIAGNOSIS — D559 Anemia due to enzyme disorder, unspecified: Secondary | ICD-10-CM | POA: Insufficient documentation

## 2016-03-18 DIAGNOSIS — D631 Anemia in chronic kidney disease: Secondary | ICD-10-CM | POA: Insufficient documentation

## 2016-03-18 DIAGNOSIS — E1129 Type 2 diabetes mellitus with other diabetic kidney complication: Secondary | ICD-10-CM | POA: Insufficient documentation

## 2016-03-18 DIAGNOSIS — Z79899 Other long term (current) drug therapy: Secondary | ICD-10-CM | POA: Insufficient documentation

## 2016-03-18 DIAGNOSIS — Z940 Kidney transplant status: Secondary | ICD-10-CM | POA: Insufficient documentation

## 2016-03-18 DIAGNOSIS — Z789 Other specified health status: Secondary | ICD-10-CM | POA: Diagnosis not present

## 2016-03-18 DIAGNOSIS — T8610 Unspecified complication of kidney transplant: Secondary | ICD-10-CM | POA: Diagnosis not present

## 2016-03-18 DIAGNOSIS — Z09 Encounter for follow-up examination after completed treatment for conditions other than malignant neoplasm: Secondary | ICD-10-CM | POA: Insufficient documentation

## 2016-03-18 DIAGNOSIS — N390 Urinary tract infection, site not specified: Secondary | ICD-10-CM | POA: Diagnosis not present

## 2016-03-18 LAB — BASIC METABOLIC PANEL
Anion gap: 7 (ref 5–15)
BUN: 49 mg/dL — AB (ref 6–20)
CHLORIDE: 110 mmol/L (ref 101–111)
CO2: 22 mmol/L (ref 22–32)
CREATININE: 1.37 mg/dL — AB (ref 0.61–1.24)
Calcium: 9.8 mg/dL (ref 8.9–10.3)
GFR calc Af Amer: >60 mL/min (ref >60)
GFR calc non Af Amer: 56 mL/min — ABNORMAL LOW (ref >60)
Glucose, Bld: 185 mg/dL — ABNORMAL HIGH (ref 65–99)
POTASSIUM: 5.6 mmol/L — AB (ref 3.5–5.1)
Sodium: 139 mmol/L (ref 135–145)

## 2016-03-18 LAB — MAGNESIUM: MAGNESIUM: 2 mg/dL (ref 1.7–2.4)

## 2016-03-18 LAB — PHOSPHORUS: Phosphorus: 3.3 mg/dL (ref 2.5–4.6)

## 2016-03-30 ENCOUNTER — Encounter: Payer: Self-pay | Admitting: *Deleted

## 2016-04-03 NOTE — Discharge Instructions (Signed)

## 2016-04-06 ENCOUNTER — Ambulatory Visit: Payer: BLUE CROSS/BLUE SHIELD | Admitting: Anesthesiology

## 2016-04-06 ENCOUNTER — Ambulatory Visit
Admission: RE | Admit: 2016-04-06 | Discharge: 2016-04-06 | Disposition: A | Discharge status: Home / Self Care | Payer: BLUE CROSS/BLUE SHIELD | Source: Ambulatory Visit | Attending: Gastroenterology | Admitting: Gastroenterology

## 2016-04-06 ENCOUNTER — Encounter
Admission: RE | Disposition: A | Discharge status: Home / Self Care | Payer: Self-pay | Source: Ambulatory Visit | Attending: Gastroenterology

## 2016-04-06 DIAGNOSIS — Z940 Kidney transplant status: Secondary | ICD-10-CM | POA: Insufficient documentation

## 2016-04-06 DIAGNOSIS — K5730 Diverticulosis of large intestine without perforation or abscess without bleeding: Secondary | ICD-10-CM | POA: Diagnosis not present

## 2016-04-06 DIAGNOSIS — Z9849 Cataract extraction status, unspecified eye: Secondary | ICD-10-CM | POA: Diagnosis not present

## 2016-04-06 DIAGNOSIS — K641 Second degree hemorrhoids: Secondary | ICD-10-CM | POA: Diagnosis not present

## 2016-04-06 DIAGNOSIS — Z8249 Family history of ischemic heart disease and other diseases of the circulatory system: Secondary | ICD-10-CM | POA: Insufficient documentation

## 2016-04-06 DIAGNOSIS — E7800 Pure hypercholesterolemia, unspecified: Secondary | ICD-10-CM | POA: Insufficient documentation

## 2016-04-06 DIAGNOSIS — Z833 Family history of diabetes mellitus: Secondary | ICD-10-CM | POA: Insufficient documentation

## 2016-04-06 DIAGNOSIS — Z8042 Family history of malignant neoplasm of prostate: Secondary | ICD-10-CM | POA: Diagnosis not present

## 2016-04-06 DIAGNOSIS — I129 Hypertensive chronic kidney disease with stage 1 through stage 4 chronic kidney disease, or unspecified chronic kidney disease: Secondary | ICD-10-CM | POA: Diagnosis not present

## 2016-04-06 DIAGNOSIS — Z1211 Encounter for screening for malignant neoplasm of colon: Secondary | ICD-10-CM | POA: Insufficient documentation

## 2016-04-06 DIAGNOSIS — N189 Chronic kidney disease, unspecified: Secondary | ICD-10-CM | POA: Insufficient documentation

## 2016-04-06 DIAGNOSIS — Z7982 Long term (current) use of aspirin: Secondary | ICD-10-CM | POA: Diagnosis not present

## 2016-04-06 DIAGNOSIS — Z79899 Other long term (current) drug therapy: Secondary | ICD-10-CM | POA: Diagnosis not present

## 2016-04-06 DIAGNOSIS — Z794 Long term (current) use of insulin: Secondary | ICD-10-CM | POA: Insufficient documentation

## 2016-04-06 DIAGNOSIS — E1122 Type 2 diabetes mellitus with diabetic chronic kidney disease: Secondary | ICD-10-CM | POA: Diagnosis not present

## 2016-04-06 DIAGNOSIS — Z841 Family history of disorders of kidney and ureter: Secondary | ICD-10-CM | POA: Insufficient documentation

## 2016-04-06 DIAGNOSIS — Z86010 Personal history of colonic polyps: Secondary | ICD-10-CM | POA: Diagnosis not present

## 2016-04-06 HISTORY — DX: Chronic kidney disease, unspecified: N18.9

## 2016-04-06 HISTORY — DX: Cardiac murmur, unspecified: R01.1

## 2016-04-06 HISTORY — PX: COLONOSCOPY WITH PROPOFOL: SHX5780

## 2016-04-06 HISTORY — DX: Pure hypercholesterolemia, unspecified: E78.00

## 2016-04-06 HISTORY — DX: Essential (primary) hypertension: I10

## 2016-04-06 HISTORY — DX: Type 2 diabetes mellitus without complications: E11.9

## 2016-04-06 LAB — GLUCOSE, CAPILLARY
GLUCOSE-CAPILLARY: 218 mg/dL — AB (ref 65–99)
Glucose-Capillary: 215 mg/dL — ABNORMAL HIGH (ref 65–99)

## 2016-04-06 SURGERY — COLONOSCOPY WITH PROPOFOL
Anesthesia: Monitor Anesthesia Care | Wound class: Contaminated

## 2016-04-06 MED ORDER — PROPOFOL 10 MG/ML IV BOLUS
INTRAVENOUS | Status: DC | PRN
  Administered 2016-04-06: 100 mg via INTRAVENOUS
  Administered 2016-04-06: 20 mg via INTRAVENOUS
  Administered 2016-04-06 (×2): 50 mg via INTRAVENOUS
  Administered 2016-04-06: 20 mg via INTRAVENOUS
  Administered 2016-04-06: 50 mg via INTRAVENOUS

## 2016-04-06 MED ORDER — STERILE WATER FOR IRRIGATION IR SOLN
Status: DC | PRN
  Administered 2016-04-06: 10:00:00

## 2016-04-06 MED ORDER — LACTATED RINGERS IV SOLN
INTRAVENOUS | Status: DC
  Administered 2016-04-06 (×2): via INTRAVENOUS

## 2016-04-06 MED ORDER — LIDOCAINE HCL (CARDIAC) 20 MG/ML IV SOLN
INTRAVENOUS | Status: DC | PRN
  Administered 2016-04-06: 20 mg via INTRAVENOUS

## 2016-04-06 SURGICAL SUPPLY — 22 items
CANISTER SUCT 1200ML W/VALVE (MISCELLANEOUS) ×3 IMPLANT
CLIP HMST 235XBRD CATH ROT (MISCELLANEOUS) IMPLANT
CLIP RESOLUTION 360 11X235 (MISCELLANEOUS)
FCP ESCP3.2XJMB 240X2.8X (MISCELLANEOUS)
FORCEPS BIOP RAD 4 LRG CAP 4 (CUTTING FORCEPS) IMPLANT
FORCEPS BIOP RJ4 240 W/NDL (MISCELLANEOUS)
FORCEPS ESCP3.2XJMB 240X2.8X (MISCELLANEOUS) IMPLANT
GOWN CVR UNV OPN BCK APRN NK (MISCELLANEOUS) ×2 IMPLANT
GOWN ISOL THUMB LOOP REG UNIV (MISCELLANEOUS) ×4
INJECTOR VARIJECT VIN23 (MISCELLANEOUS) IMPLANT
KIT DEFENDO VALVE AND CONN (KITS) IMPLANT
KIT ENDO PROCEDURE OLY (KITS) ×3 IMPLANT
MARKER SPOT ENDO TATTOO 5ML (MISCELLANEOUS) IMPLANT
PAD GROUND ADULT SPLIT (MISCELLANEOUS) IMPLANT
PROBE APC STR FIRE (PROBE) IMPLANT
SNARE SHORT THROW 13M SML OVAL (MISCELLANEOUS) IMPLANT
SNARE SHORT THROW 30M LRG OVAL (MISCELLANEOUS) IMPLANT
SNARE SNG USE RND 15MM (INSTRUMENTS) IMPLANT
SPOT EX ENDOSCOPIC TATTOO (MISCELLANEOUS)
TRAP ETRAP POLY (MISCELLANEOUS) IMPLANT
VARIJECT INJECTOR VIN23 (MISCELLANEOUS)
WATER STERILE IRR 250ML POUR (IV SOLUTION) ×3 IMPLANT

## 2016-04-06 NOTE — H&P (Signed)
[facility]  [address], [place], [place] [address] Phone: [phone] Fax : [phone]  Primary Care Physician:  Wilhemena Durie, MD Primary Gastroenterologist:  Dr. Allen Norris  Pre-Procedure History & Physical: HPI:  Rick Wang is a 60 y.o. male is here for a screening colonoscopy.   Past Medical History  Diagnosis Date  . Chronic kidney disease     Bilateral kidney failure. Rcvd new RIGHT kidney.  . Hypertension   . Heart murmur     followed by PCP  . Diabetes mellitus without complication ([place])   . Hypercholesteremia     Past Surgical History  Procedure Laterality Date  . Cataract extraction    . Hernia repair  0000000    umbilical  . Peritoneal catheter insertion    . 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 Authorizing Provider  aspirin 81 MG tablet Take by mouth. 08/08/13  Yes Historical Provider, MD  atorvastatin (LIPITOR) 20 MG tablet Take by mouth. 08/08/13  Yes Historical Provider, MD  Brinzolamide-Brimonidine [facility]) 1-0.2 % SUSP Apply to eye. 08/08/13  Yes Historical Provider, MD  cinacalcet (SENSIPAR) 60 MG tablet Take 60 mg by mouth. 08/15/15 08/14/16 Yes Historical Provider, MD  docusate sodium (COLACE) 100 MG capsule Take by mouth. 08/08/13  Yes Historical Provider, MD  ferrous sulfate 325 (65 FE) MG tablet Take by mouth. 08/08/13  Yes Historical Provider, MD  furosemide (LASIX) 20 MG tablet Take 20 mg by mouth. 07/16/15 07/15/16 Yes Historical Provider, MD  Insulin Glargine (TOUJEO SOLOSTAR) 300 UNIT/ML SOPN Inject 16 Units into the skin daily.    Yes Historical Provider, MD  lisinopril (PRINIVIL,ZESTRIL) 40 MG tablet Take 40 mg by mouth. 08/15/15 08/14/16 Yes Historical Provider, MD  mycophenolate (MYFORTIC) 180 MG EC tablet Take by mouth. 08/08/13  Yes Historical Provider, MD  NOVOLOG 100 UNIT/ML injection INJECT 7 UNITS UNDER THE SKIN BEFORE BREAKFAST, 5 UNITS  BEFORE LUNCH AND 4 UNITS BEFORE DINNER 10/23/15  Yes Richard Maceo Pro., MD  [facility] DELICA LANCETS FINE MISC  10/09/13  Yes Historical Provider, MD  tacrolimus (PROGRAF) 1 MG capsule  05/10/15  Yes Historical Provider, MD  Insulin Syringe-Needle U-100 (BD INSULIN SYRINGE ULTRAFINE) 31G X 5/16" 0.5 ML MISC  10/09/13   Historical Provider, MD  Polyethylene Glycol 3350 GRAN Take by mouth. 08/08/13   Historical Provider, MD    Allergies as of 02/21/2016  . (No Known Allergies)    Family History  Problem Relation Age of Onset  . Diabetes Mother   . Congestive Heart Failure Mother   . Prostate cancer Father   . Diabetes Father   . Kidney disease Father     Social History   Social History  . Marital Status: Married    Spouse Name: N/A  . Number of Children: N/A  . Years of Education: N/A   Occupational History  . Not on file.   Social History Main Topics  . Smoking status: Never Smoker   . Smokeless tobacco: Never Used  . Alcohol Use: No  . Drug Use: No  . Sexual Activity: Not on file   Other Topics Concern  . Not on file   Social History Narrative    Review of Systems: See HPI, otherwise negative ROS  Physical Exam: Ht 5\' 10"  (1.778 m)  Wt 150 lb (68.04 kg)  BMI 21.52 kg/m2 General:   Alert,  pleasant and cooperative in  NAD Head:  Normocephalic and atraumatic. Neck:  Supple; no masses or thyromegaly. Lungs:  Clear throughout to auscultation.    Heart:  Regular rate and rhythm. Abdomen:  Soft, nontender and nondistended. Normal bowel sounds, without guarding, and without rebound.   Neurologic:  Alert and  oriented x4;  grossly normal neurologically.  Impression/Plan: Rick Wang is now here to undergo a screening colonoscopy.  Risks, benefits, and alternatives regarding colonoscopy have been reviewed with the patient.  Questions have been answered.  All parties agreeable.

## 2016-04-06 NOTE — Op Note (Signed)
[facility] Gastroenterology Patient Name: Rick Wang Procedure Date: 04/06/2016 9:16 AM MRN: [id number] Account #: [id number] Date of Birth: 1956-11-21 Admit Type: Outpatient Age: 60 Room: [facility] OR ROOM 01 Gender: Male Note Status: Finalized Procedure:            Colonoscopy Indications:          Screening for colorectal malignant neoplasm Providers:            Lucilla Lame, MD Referring MD:         Janine Ores. Rosanna Randy, MD (Referring MD) Medicines:            Propofol per Anesthesia Complications:        No immediate complications. Procedure:            Pre-Anesthesia Assessment:                       - Prior to the procedure, a History and Physical was                        performed, and patient medications and allergies were                        reviewed. The patient's tolerance of previous                        anesthesia was also reviewed. The risks and benefits of                        the procedure and the sedation options and risks were                        discussed with the patient. All questions were                        answered, and informed consent was obtained. Prior                        Anticoagulants: The patient has taken no previous                        anticoagulant or antiplatelet agents. ASA Grade                        Assessment: II - A patient with mild systemic disease.                        After reviewing the risks and benefits, the patient was                        deemed in satisfactory condition to undergo the                        procedure.                       After obtaining informed consent, the colonoscope was                        passed under direct vision. Throughout the procedure,  the patient's blood pressure, pulse, and oxygen                        saturations were monitored continuously. The was                        introduced through the anus and advanced to the the               cecum, identified by appendiceal orifice and ileocecal                        valve. The colonoscopy was performed without                        difficulty. The patient tolerated the procedure well.                        The quality of the bowel preparation was excellent. Findings:      The perianal and digital rectal examinations were normal.      Multiple small-mouthed diverticula were found in the sigmoid colon.      Non-bleeding internal hemorrhoids were found during retroflexion. The       hemorrhoids were Grade II (internal hemorrhoids that prolapse but reduce       spontaneously). Impression:           - Diverticulosis in the sigmoid colon.                       - Non-bleeding internal hemorrhoids.                       - No specimens collected. Recommendation:       - Repeat colonoscopy in 10 years for screening unless                        any change in family history or lower GI problems. Procedure Code(s):    --- Professional ---                       [phone], Colonoscopy, flexible; diagnostic, including                        collection of specimen(s) by brushing or washing, when                        performed (separate procedure) Diagnosis Code(s):    --- Professional ---                       Z12.11, Encounter for screening for malignant neoplasm                        of colon CPT copyright 2016 American Medical Association. All rights reserved. The codes documented in this report are preliminary and upon coder review may  be revised to meet current compliance requirements. Lucilla Lame, MD 04/06/2016 9:41:50 AM This report has been signed electronically. Number of Addenda: 0 Note Initiated On: 04/06/2016 9:16 AM Scope Withdrawal Time: 0 hours 6 minutes 35 seconds  Total Procedure Duration: 0 hours 14 minutes 20 seconds       [facility]

## 2016-04-06 NOTE — Anesthesia Postprocedure Evaluation (Signed)
Anesthesia Post Note  Patient: Rick Wang  Procedure(s) Performed: Procedure(s) (LRB): COLONOSCOPY WITH PROPOFOL (N/A)  Patient location during evaluation: PACU Anesthesia Type: MAC Level of consciousness: awake and alert and oriented Pain management: satisfactory to patient Vital Signs Assessment: post-procedure vital signs reviewed and stable Respiratory status: spontaneous breathing, nonlabored ventilation and respiratory function stable Cardiovascular status: blood pressure returned to baseline and stable Postop Assessment: Adequate PO intake and No signs of nausea or vomiting Anesthetic complications: no    Raliegh Ip

## 2016-04-06 NOTE — Transfer of Care (Signed)
Immediate Anesthesia Transfer of Care Note  Patient: Rick Wang  Procedure(s) Performed: Procedure(s): COLONOSCOPY WITH PROPOFOL (N/A)  Patient Location: PACU  Anesthesia Type: MAC  Level of Consciousness: awake, alert  and patient cooperative  Airway and Oxygen Therapy: Patient Spontanous Breathing and Patient connected to supplemental oxygen  Post-op Assessment: Post-op Vital signs reviewed, Patient's Cardiovascular Status Stable, Respiratory Function Stable, Patent Airway and No signs of Nausea or vomiting  Post-op Vital Signs: Reviewed and stable  Complications: No apparent anesthesia complications

## 2016-04-06 NOTE — Anesthesia Preprocedure Evaluation (Signed)
Anesthesia Evaluation  Patient identified by MRN, date of birth, ID band  Reviewed: Allergy & Precautions, H&P , NPO status , Patient's Chart, lab work & pertinent test results  Airway Mallampati: II  TM Distance: >3 FB Neck ROM: full    Dental no notable dental hx.    Pulmonary    Pulmonary exam normal        Cardiovascular hypertension,  Rhythm:regular Rate:Normal     Neuro/Psych    GI/Hepatic   Endo/Other  diabetes  Renal/GU Renal disease     Musculoskeletal   Abdominal   Peds  Hematology   Anesthesia Other Findings   Reproductive/Obstetrics                             Anesthesia Physical Anesthesia Plan  ASA: II  Anesthesia Plan: MAC   Post-op Pain Management:    Induction:   Airway Management Planned:   Additional Equipment:   Intra-op Plan:   Post-operative Plan:   Informed Consent: I have reviewed the patients History and Physical, chart, labs and discussed the procedure including the risks, benefits and alternatives for the proposed anesthesia with the patient or authorized representative who has indicated his/her understanding and acceptance.     Plan Discussed with: CRNA  Anesthesia Plan Comments:         Anesthesia Quick Evaluation

## 2016-04-07 ENCOUNTER — Encounter: Payer: Self-pay | Admitting: Gastroenterology

## 2016-04-27 DIAGNOSIS — H401134 Primary open-angle glaucoma, bilateral, indeterminate stage: Secondary | ICD-10-CM | POA: Diagnosis not present

## 2016-04-30 ENCOUNTER — Other Ambulatory Visit
Admission: RE | Admit: 2016-04-30 | Discharge: 2016-04-30 | Disposition: A | Discharge status: Home / Self Care | Payer: BLUE CROSS/BLUE SHIELD | Source: Ambulatory Visit | Attending: Nephrology | Admitting: Nephrology

## 2016-04-30 DIAGNOSIS — E1129 Type 2 diabetes mellitus with other diabetic kidney complication: Secondary | ICD-10-CM | POA: Insufficient documentation

## 2016-04-30 DIAGNOSIS — D899 Disorder involving the immune mechanism, unspecified: Secondary | ICD-10-CM | POA: Insufficient documentation

## 2016-04-30 DIAGNOSIS — Z9483 Pancreas transplant status: Secondary | ICD-10-CM | POA: Insufficient documentation

## 2016-04-30 DIAGNOSIS — Z79899 Other long term (current) drug therapy: Secondary | ICD-10-CM | POA: Insufficient documentation

## 2016-04-30 DIAGNOSIS — Z940 Kidney transplant status: Secondary | ICD-10-CM | POA: Insufficient documentation

## 2016-04-30 LAB — BASIC METABOLIC PANEL
ANION GAP: 4 — AB (ref 5–15)
BUN: 40 mg/dL — ABNORMAL HIGH (ref 6–20)
CHLORIDE: 111 mmol/L (ref 101–111)
CO2: 24 mmol/L (ref 22–32)
Calcium: 10 mg/dL (ref 8.9–10.3)
Creatinine, Ser: 1.31 mg/dL — ABNORMAL HIGH (ref 0.61–1.24)
GFR calc non Af Amer: 59 mL/min — ABNORMAL LOW (ref >60)
Glucose, Bld: 159 mg/dL — ABNORMAL HIGH (ref 65–99)
POTASSIUM: 5.6 mmol/L — AB (ref 3.5–5.1)
Sodium: 139 mmol/L (ref 135–145)

## 2016-04-30 LAB — PHOSPHORUS: Phosphorus: 3.3 mg/dL (ref 2.5–4.6)

## 2016-04-30 LAB — MAGNESIUM: MAGNESIUM: 1.9 mg/dL (ref 1.7–2.4)

## 2016-04-30 LAB — CBC WITH DIFFERENTIAL/PLATELET
BASOS ABS: 0.1 10*3/uL (ref 0–0.1)
Eosinophils Absolute: 0.1 10*3/uL (ref 0–0.7)
Eosinophils Relative: 1 %
HEMATOCRIT: 47.9 % (ref 40.0–52.0)
HEMOGLOBIN: 16.1 g/dL (ref 13.0–18.0)
Lymphocytes Relative: 9 %
Lymphs Abs: 0.8 10*3/uL — ABNORMAL LOW (ref 1.0–3.6)
MCH: 33.7 pg (ref 26.0–34.0)
MCHC: 33.7 g/dL (ref 32.0–36.0)
MCV: 100.2 fL — ABNORMAL HIGH (ref 80.0–100.0)
Monocytes Absolute: 0.8 10*3/uL (ref 0.2–1.0)
NEUTROS ABS: 6.6 10*3/uL — AB (ref 1.4–6.5)
Platelets: 103 10*3/uL — ABNORMAL LOW (ref 150–440)
RBC: 4.78 MIL/uL (ref 4.40–5.90)
RDW: 12.9 % (ref 11.5–14.5)
WBC: 8.4 10*3/uL (ref 3.8–10.6)

## 2016-05-19 DIAGNOSIS — E10649 Type 1 diabetes mellitus with hypoglycemia without coma: Secondary | ICD-10-CM | POA: Diagnosis not present

## 2016-05-28 DIAGNOSIS — Z940 Kidney transplant status: Secondary | ICD-10-CM | POA: Diagnosis not present

## 2016-05-28 DIAGNOSIS — E10649 Type 1 diabetes mellitus with hypoglycemia without coma: Secondary | ICD-10-CM | POA: Diagnosis not present

## 2016-05-28 DIAGNOSIS — E785 Hyperlipidemia, unspecified: Secondary | ICD-10-CM | POA: Diagnosis not present

## 2016-05-28 DIAGNOSIS — E1021 Type 1 diabetes mellitus with diabetic nephropathy: Secondary | ICD-10-CM | POA: Diagnosis not present

## 2016-06-01 DIAGNOSIS — E1021 Type 1 diabetes mellitus with diabetic nephropathy: Secondary | ICD-10-CM | POA: Diagnosis not present

## 2016-06-01 DIAGNOSIS — E10641 Type 1 diabetes mellitus with hypoglycemia with coma: Secondary | ICD-10-CM | POA: Diagnosis not present

## 2016-06-01 DIAGNOSIS — E10649 Type 1 diabetes mellitus with hypoglycemia without coma: Secondary | ICD-10-CM | POA: Diagnosis not present

## 2016-06-11 ENCOUNTER — Other Ambulatory Visit
Admission: RE | Admit: 2016-06-11 | Discharge: 2016-06-11 | Disposition: A | Discharge status: Home / Self Care | Payer: BLUE CROSS/BLUE SHIELD | Source: Ambulatory Visit | Attending: Nephrology | Admitting: Nephrology

## 2016-06-11 DIAGNOSIS — Z79899 Other long term (current) drug therapy: Secondary | ICD-10-CM | POA: Insufficient documentation

## 2016-06-11 DIAGNOSIS — E559 Vitamin D deficiency, unspecified: Secondary | ICD-10-CM | POA: Diagnosis not present

## 2016-06-11 DIAGNOSIS — Z9483 Pancreas transplant status: Secondary | ICD-10-CM | POA: Insufficient documentation

## 2016-06-11 DIAGNOSIS — D631 Anemia in chronic kidney disease: Secondary | ICD-10-CM | POA: Insufficient documentation

## 2016-06-11 DIAGNOSIS — E1129 Type 2 diabetes mellitus with other diabetic kidney complication: Secondary | ICD-10-CM | POA: Insufficient documentation

## 2016-06-11 DIAGNOSIS — D899 Disorder involving the immune mechanism, unspecified: Secondary | ICD-10-CM | POA: Insufficient documentation

## 2016-06-11 DIAGNOSIS — Z940 Kidney transplant status: Secondary | ICD-10-CM | POA: Diagnosis not present

## 2016-06-11 LAB — CBC WITH DIFFERENTIAL/PLATELET
Basophils Absolute: 0 10*3/uL (ref 0–0.1)
EOS ABS: 0.7 10*3/uL (ref 0–0.7)
Eosinophils Relative: 20 %
HCT: 29 % — ABNORMAL LOW (ref 40.0–52.0)
Hemoglobin: 9.6 g/dL — ABNORMAL LOW (ref 13.0–18.0)
Lymphocytes Relative: 20 %
Lymphs Abs: 0.7 10*3/uL — ABNORMAL LOW (ref 1.0–3.6)
MCH: 30.8 pg (ref 26.0–34.0)
MCHC: 32.9 g/dL (ref 32.0–36.0)
MCV: 93.5 fL (ref 80.0–100.0)
MONO ABS: 0.4 10*3/uL (ref 0.2–1.0)
Neutro Abs: 1.7 10*3/uL (ref 1.4–6.5)
Platelets: 158 10*3/uL (ref 150–440)
RBC: 3.1 MIL/uL — ABNORMAL LOW (ref 4.40–5.90)
RDW: 13.4 % (ref 11.5–14.5)
WBC: 3.5 10*3/uL — ABNORMAL LOW (ref 3.8–10.6)

## 2016-06-11 LAB — MAGNESIUM: Magnesium: 1.8 mg/dL (ref 1.7–2.4)

## 2016-06-11 LAB — BASIC METABOLIC PANEL
ANION GAP: 4 — AB (ref 5–15)
BUN: 46 mg/dL — AB (ref 6–20)
CALCIUM: 9.3 mg/dL (ref 8.9–10.3)
CHLORIDE: 110 mmol/L (ref 101–111)
CO2: 26 mmol/L (ref 22–32)
CREATININE: 1.47 mg/dL — AB (ref 0.61–1.24)
GFR calc Af Amer: 59 mL/min — ABNORMAL LOW (ref >60)
GFR calc non Af Amer: 51 mL/min — ABNORMAL LOW (ref >60)
Glucose, Bld: 151 mg/dL — ABNORMAL HIGH (ref 65–99)
POTASSIUM: 5.3 mmol/L — AB (ref 3.5–5.1)
SODIUM: 140 mmol/L (ref 135–145)

## 2016-06-11 LAB — PHOSPHORUS: Phosphorus: 3.7 mg/dL (ref 2.5–4.6)

## 2016-07-01 ENCOUNTER — Other Ambulatory Visit
Admission: RE | Admit: 2016-07-01 | Discharge: 2016-07-01 | Disposition: A | Discharge status: Home / Self Care | Payer: BLUE CROSS/BLUE SHIELD | Source: Ambulatory Visit | Attending: Nephrology | Admitting: Nephrology

## 2016-07-01 DIAGNOSIS — E559 Vitamin D deficiency, unspecified: Secondary | ICD-10-CM | POA: Diagnosis not present

## 2016-07-01 DIAGNOSIS — E1129 Type 2 diabetes mellitus with other diabetic kidney complication: Secondary | ICD-10-CM | POA: Insufficient documentation

## 2016-07-01 DIAGNOSIS — D631 Anemia in chronic kidney disease: Secondary | ICD-10-CM | POA: Diagnosis not present

## 2016-07-01 DIAGNOSIS — D899 Disorder involving the immune mechanism, unspecified: Secondary | ICD-10-CM | POA: Diagnosis not present

## 2016-07-01 DIAGNOSIS — Z940 Kidney transplant status: Secondary | ICD-10-CM | POA: Insufficient documentation

## 2016-07-01 DIAGNOSIS — Z9483 Pancreas transplant status: Secondary | ICD-10-CM | POA: Diagnosis not present

## 2016-07-01 LAB — BASIC METABOLIC PANEL
ANION GAP: 3 — AB (ref 5–15)
BUN: 47 mg/dL — AB (ref 6–20)
CO2: 27 mmol/L (ref 22–32)
Calcium: 9 mg/dL (ref 8.9–10.3)
Chloride: 107 mmol/L (ref 101–111)
Creatinine, Ser: 1.68 mg/dL — ABNORMAL HIGH (ref 0.61–1.24)
GFR calc Af Amer: 51 mL/min — ABNORMAL LOW (ref >60)
GFR calc non Af Amer: 44 mL/min — ABNORMAL LOW (ref >60)
GLUCOSE: 172 mg/dL — AB (ref 65–99)
POTASSIUM: 4.8 mmol/L (ref 3.5–5.1)
Sodium: 137 mmol/L (ref 135–145)

## 2016-07-06 DIAGNOSIS — Z1283 Encounter for screening for malignant neoplasm of skin: Secondary | ICD-10-CM | POA: Diagnosis not present

## 2016-07-06 DIAGNOSIS — D485 Neoplasm of uncertain behavior of skin: Secondary | ICD-10-CM | POA: Diagnosis not present

## 2016-07-06 DIAGNOSIS — Z872 Personal history of diseases of the skin and subcutaneous tissue: Secondary | ICD-10-CM | POA: Diagnosis not present

## 2016-07-06 DIAGNOSIS — Z08 Encounter for follow-up examination after completed treatment for malignant neoplasm: Secondary | ICD-10-CM | POA: Diagnosis not present

## 2016-07-06 DIAGNOSIS — Z85828 Personal history of other malignant neoplasm of skin: Secondary | ICD-10-CM | POA: Diagnosis not present

## 2016-07-13 ENCOUNTER — Other Ambulatory Visit
Admission: RE | Admit: 2016-07-13 | Discharge: 2016-07-13 | Disposition: A | Discharge status: Home / Self Care | Payer: BLUE CROSS/BLUE SHIELD | Source: Ambulatory Visit | Attending: Nephrology | Admitting: Nephrology

## 2016-07-13 DIAGNOSIS — Z940 Kidney transplant status: Secondary | ICD-10-CM | POA: Diagnosis not present

## 2016-07-13 DIAGNOSIS — B259 Cytomegaloviral disease, unspecified: Secondary | ICD-10-CM | POA: Insufficient documentation

## 2016-07-13 DIAGNOSIS — Z9483 Pancreas transplant status: Secondary | ICD-10-CM | POA: Diagnosis not present

## 2016-07-13 DIAGNOSIS — N390 Urinary tract infection, site not specified: Secondary | ICD-10-CM | POA: Diagnosis not present

## 2016-07-13 DIAGNOSIS — Z789 Other specified health status: Secondary | ICD-10-CM | POA: Insufficient documentation

## 2016-07-13 DIAGNOSIS — E1129 Type 2 diabetes mellitus with other diabetic kidney complication: Secondary | ICD-10-CM | POA: Diagnosis not present

## 2016-07-13 DIAGNOSIS — Z79899 Other long term (current) drug therapy: Secondary | ICD-10-CM | POA: Insufficient documentation

## 2016-07-13 DIAGNOSIS — E559 Vitamin D deficiency, unspecified: Secondary | ICD-10-CM | POA: Insufficient documentation

## 2016-07-13 DIAGNOSIS — D631 Anemia in chronic kidney disease: Secondary | ICD-10-CM | POA: Diagnosis not present

## 2016-07-13 LAB — BASIC METABOLIC PANEL
ANION GAP: 3 — AB (ref 5–15)
BUN: 43 mg/dL — ABNORMAL HIGH (ref 6–20)
CALCIUM: 9.2 mg/dL (ref 8.9–10.3)
CO2: 27 mmol/L (ref 22–32)
CREATININE: 1.52 mg/dL — AB (ref 0.61–1.24)
Chloride: 109 mmol/L (ref 101–111)
GFR, EST AFRICAN AMERICAN: 57 mL/min — AB (ref >60)
GFR, EST NON AFRICAN AMERICAN: 49 mL/min — AB (ref >60)
Glucose, Bld: 180 mg/dL — ABNORMAL HIGH (ref 65–99)
Potassium: 5.2 mmol/L — ABNORMAL HIGH (ref 3.5–5.1)
SODIUM: 139 mmol/L (ref 135–145)

## 2016-07-13 LAB — CBC WITH DIFFERENTIAL/PLATELET
BASOS ABS: 0.1 10*3/uL (ref 0–0.1)
Basophils Relative: 2 %
EOS ABS: 0.6 10*3/uL (ref 0–0.7)
EOS PCT: 18 %
HCT: 29.4 % — ABNORMAL LOW (ref 40.0–52.0)
Hemoglobin: 9.4 g/dL — ABNORMAL LOW (ref 13.0–18.0)
LYMPHS ABS: 0.9 10*3/uL — AB (ref 1.0–3.6)
Lymphocytes Relative: 27 %
MCH: 30.1 pg (ref 26.0–34.0)
MCHC: 32 g/dL (ref 32.0–36.0)
MCV: 93.9 fL (ref 80.0–100.0)
MONO ABS: 0.4 10*3/uL (ref 0.2–1.0)
Monocytes Relative: 12 %
Neutro Abs: 1.4 10*3/uL (ref 1.4–6.5)
Neutrophils Relative %: 41 %
PLATELETS: 181 10*3/uL (ref 150–440)
RBC: 3.13 MIL/uL — AB (ref 4.40–5.90)
RDW: 13.3 % (ref 11.5–14.5)
WBC: 3.5 10*3/uL — AB (ref 3.8–10.6)

## 2016-07-13 LAB — PHOSPHORUS: Phosphorus: 3.6 mg/dL (ref 2.5–4.6)

## 2016-07-13 LAB — MAGNESIUM: Magnesium: 1.7 mg/dL (ref 1.7–2.4)

## 2016-07-27 DIAGNOSIS — H401134 Primary open-angle glaucoma, bilateral, indeterminate stage: Secondary | ICD-10-CM | POA: Diagnosis not present

## 2016-08-12 DIAGNOSIS — C4490 Unspecified malignant neoplasm of skin, unspecified: Secondary | ICD-10-CM | POA: Diagnosis not present

## 2016-08-12 DIAGNOSIS — Z940 Kidney transplant status: Secondary | ICD-10-CM | POA: Diagnosis not present

## 2016-08-12 DIAGNOSIS — D649 Anemia, unspecified: Secondary | ICD-10-CM | POA: Diagnosis not present

## 2016-08-12 DIAGNOSIS — Z7982 Long term (current) use of aspirin: Secondary | ICD-10-CM | POA: Diagnosis not present

## 2016-08-12 DIAGNOSIS — E785 Hyperlipidemia, unspecified: Secondary | ICD-10-CM | POA: Diagnosis not present

## 2016-08-12 DIAGNOSIS — Z4822 Encounter for aftercare following kidney transplant: Secondary | ICD-10-CM | POA: Diagnosis not present

## 2016-08-12 DIAGNOSIS — E10649 Type 1 diabetes mellitus with hypoglycemia without coma: Secondary | ICD-10-CM | POA: Diagnosis not present

## 2016-08-12 DIAGNOSIS — D899 Disorder involving the immune mechanism, unspecified: Secondary | ICD-10-CM | POA: Diagnosis not present

## 2016-08-12 DIAGNOSIS — N186 End stage renal disease: Secondary | ICD-10-CM | POA: Diagnosis not present

## 2016-08-12 DIAGNOSIS — Z79899 Other long term (current) drug therapy: Secondary | ICD-10-CM | POA: Diagnosis not present

## 2016-08-12 DIAGNOSIS — Z86718 Personal history of other venous thrombosis and embolism: Secondary | ICD-10-CM | POA: Diagnosis not present

## 2016-08-12 DIAGNOSIS — Z794 Long term (current) use of insulin: Secondary | ICD-10-CM | POA: Diagnosis not present

## 2016-08-12 DIAGNOSIS — E875 Hyperkalemia: Secondary | ICD-10-CM | POA: Diagnosis not present

## 2016-08-12 DIAGNOSIS — I10 Essential (primary) hypertension: Secondary | ICD-10-CM | POA: Diagnosis not present

## 2016-08-21 DIAGNOSIS — N400 Enlarged prostate without lower urinary tract symptoms: Secondary | ICD-10-CM | POA: Diagnosis not present

## 2016-08-21 DIAGNOSIS — D899 Disorder involving the immune mechanism, unspecified: Secondary | ICD-10-CM | POA: Diagnosis not present

## 2016-08-21 DIAGNOSIS — N281 Cyst of kidney, acquired: Secondary | ICD-10-CM | POA: Diagnosis not present

## 2016-08-21 DIAGNOSIS — Z940 Kidney transplant status: Secondary | ICD-10-CM | POA: Diagnosis not present

## 2016-08-24 DIAGNOSIS — E1021 Type 1 diabetes mellitus with diabetic nephropathy: Secondary | ICD-10-CM | POA: Diagnosis not present

## 2016-08-24 DIAGNOSIS — E10641 Type 1 diabetes mellitus with hypoglycemia with coma: Secondary | ICD-10-CM | POA: Diagnosis not present

## 2016-08-24 DIAGNOSIS — E10649 Type 1 diabetes mellitus with hypoglycemia without coma: Secondary | ICD-10-CM | POA: Diagnosis not present

## 2016-09-20 ENCOUNTER — Other Ambulatory Visit: Payer: Self-pay | Admitting: Family Medicine

## 2016-10-01 ENCOUNTER — Other Ambulatory Visit
Admission: RE | Admit: 2016-10-01 | Discharge: 2016-10-01 | Disposition: A | Discharge status: Home / Self Care | Payer: BLUE CROSS/BLUE SHIELD | Source: Ambulatory Visit | Attending: Nephrology | Admitting: Nephrology

## 2016-10-01 DIAGNOSIS — B259 Cytomegaloviral disease, unspecified: Secondary | ICD-10-CM | POA: Diagnosis not present

## 2016-10-01 DIAGNOSIS — D899 Disorder involving the immune mechanism, unspecified: Secondary | ICD-10-CM | POA: Diagnosis not present

## 2016-10-01 DIAGNOSIS — Z79899 Other long term (current) drug therapy: Secondary | ICD-10-CM | POA: Insufficient documentation

## 2016-10-01 DIAGNOSIS — E1129 Type 2 diabetes mellitus with other diabetic kidney complication: Secondary | ICD-10-CM | POA: Diagnosis not present

## 2016-10-01 DIAGNOSIS — Z9483 Pancreas transplant status: Secondary | ICD-10-CM | POA: Diagnosis not present

## 2016-10-01 DIAGNOSIS — T8610 Unspecified complication of kidney transplant: Secondary | ICD-10-CM | POA: Diagnosis not present

## 2016-10-01 DIAGNOSIS — Z940 Kidney transplant status: Secondary | ICD-10-CM | POA: Diagnosis not present

## 2016-10-01 DIAGNOSIS — N390 Urinary tract infection, site not specified: Secondary | ICD-10-CM | POA: Diagnosis not present

## 2016-10-01 DIAGNOSIS — N189 Chronic kidney disease, unspecified: Secondary | ICD-10-CM | POA: Insufficient documentation

## 2016-10-01 DIAGNOSIS — Z789 Other specified health status: Secondary | ICD-10-CM | POA: Diagnosis not present

## 2016-10-01 DIAGNOSIS — Z09 Encounter for follow-up examination after completed treatment for conditions other than malignant neoplasm: Secondary | ICD-10-CM | POA: Insufficient documentation

## 2016-10-01 DIAGNOSIS — D631 Anemia in chronic kidney disease: Secondary | ICD-10-CM | POA: Insufficient documentation

## 2016-10-01 DIAGNOSIS — E559 Vitamin D deficiency, unspecified: Secondary | ICD-10-CM | POA: Diagnosis not present

## 2016-10-01 DIAGNOSIS — Z114 Encounter for screening for human immunodeficiency virus [HIV]: Secondary | ICD-10-CM | POA: Insufficient documentation

## 2016-10-01 LAB — CBC WITH DIFFERENTIAL/PLATELET
BASOS ABS: 0 10*3/uL (ref 0–0.1)
Basophils Relative: 1 %
EOS PCT: 21 %
Eosinophils Absolute: 0.7 10*3/uL (ref 0–0.7)
HEMATOCRIT: 31.7 % — AB (ref 40.0–52.0)
Hemoglobin: 10.2 g/dL — ABNORMAL LOW (ref 13.0–18.0)
LYMPHS ABS: 0.8 10*3/uL — AB (ref 1.0–3.6)
LYMPHS PCT: 23 %
MCH: 30.2 pg (ref 26.0–34.0)
MCHC: 32.1 g/dL (ref 32.0–36.0)
MCV: 94 fL (ref 80.0–100.0)
MONO ABS: 0.4 10*3/uL (ref 0.2–1.0)
MONOS PCT: 12 %
NEUTROS ABS: 1.4 10*3/uL (ref 1.4–6.5)
Neutrophils Relative %: 43 %
PLATELETS: 158 10*3/uL (ref 150–440)
RBC: 3.37 MIL/uL — ABNORMAL LOW (ref 4.40–5.90)
RDW: 13.5 % (ref 11.5–14.5)
WBC: 3.3 10*3/uL — ABNORMAL LOW (ref 3.8–10.6)

## 2016-10-01 LAB — BASIC METABOLIC PANEL
ANION GAP: 5 (ref 5–15)
BUN: 32 mg/dL — AB (ref 6–20)
CALCIUM: 9.3 mg/dL (ref 8.9–10.3)
CO2: 25 mmol/L (ref 22–32)
Chloride: 107 mmol/L (ref 101–111)
Creatinine, Ser: 1.2 mg/dL (ref 0.61–1.24)
GFR calc Af Amer: >60 mL/min (ref >60)
GFR calc non Af Amer: >60 mL/min (ref >60)
GLUCOSE: 148 mg/dL — AB (ref 65–99)
Potassium: 4.8 mmol/L (ref 3.5–5.1)
Sodium: 137 mmol/L (ref 135–145)

## 2016-10-01 LAB — MAGNESIUM: Magnesium: 1.7 mg/dL (ref 1.7–2.4)

## 2016-10-01 LAB — PHOSPHORUS: Phosphorus: 2.9 mg/dL (ref 2.5–4.6)

## 2016-10-09 ENCOUNTER — Other Ambulatory Visit
Admission: RE | Admit: 2016-10-09 | Discharge: 2016-10-09 | Disposition: A | Discharge status: Home / Self Care | Payer: BLUE CROSS/BLUE SHIELD | Source: Ambulatory Visit | Attending: Nephrology | Admitting: Nephrology

## 2016-10-09 DIAGNOSIS — T8610 Unspecified complication of kidney transplant: Secondary | ICD-10-CM | POA: Diagnosis not present

## 2016-10-09 DIAGNOSIS — B259 Cytomegaloviral disease, unspecified: Secondary | ICD-10-CM | POA: Insufficient documentation

## 2016-10-09 DIAGNOSIS — D631 Anemia in chronic kidney disease: Secondary | ICD-10-CM | POA: Insufficient documentation

## 2016-10-09 DIAGNOSIS — Z9483 Pancreas transplant status: Secondary | ICD-10-CM | POA: Diagnosis not present

## 2016-10-09 DIAGNOSIS — Z79899 Other long term (current) drug therapy: Secondary | ICD-10-CM | POA: Diagnosis not present

## 2016-10-09 DIAGNOSIS — Z114 Encounter for screening for human immunodeficiency virus [HIV]: Secondary | ICD-10-CM | POA: Insufficient documentation

## 2016-10-09 DIAGNOSIS — N390 Urinary tract infection, site not specified: Secondary | ICD-10-CM | POA: Diagnosis not present

## 2016-10-09 DIAGNOSIS — E1129 Type 2 diabetes mellitus with other diabetic kidney complication: Secondary | ICD-10-CM | POA: Insufficient documentation

## 2016-10-09 DIAGNOSIS — Z09 Encounter for follow-up examination after completed treatment for conditions other than malignant neoplasm: Secondary | ICD-10-CM | POA: Insufficient documentation

## 2016-10-09 DIAGNOSIS — Z940 Kidney transplant status: Secondary | ICD-10-CM | POA: Diagnosis not present

## 2016-10-09 DIAGNOSIS — E559 Vitamin D deficiency, unspecified: Secondary | ICD-10-CM | POA: Diagnosis not present

## 2016-10-09 DIAGNOSIS — N189 Chronic kidney disease, unspecified: Secondary | ICD-10-CM | POA: Insufficient documentation

## 2016-10-09 DIAGNOSIS — D899 Disorder involving the immune mechanism, unspecified: Secondary | ICD-10-CM | POA: Diagnosis not present

## 2016-10-09 DIAGNOSIS — Z789 Other specified health status: Secondary | ICD-10-CM | POA: Diagnosis not present

## 2016-10-09 LAB — CBC WITH DIFFERENTIAL/PLATELET
BAND NEUTROPHILS: 0 %
BASOS ABS: 0.1 10*3/uL (ref 0–0.1)
BASOS PCT: 2 %
Blasts: 0 %
EOS ABS: 0.4 10*3/uL (ref 0–0.7)
EOS PCT: 11 %
HCT: 32.7 % — ABNORMAL LOW (ref 40.0–52.0)
HEMOGLOBIN: 10.6 g/dL — AB (ref 13.0–18.0)
LYMPHS ABS: 0.8 10*3/uL — AB (ref 1.0–3.6)
Lymphocytes Relative: 22 %
MCH: 30.2 pg (ref 26.0–34.0)
MCHC: 32.3 g/dL (ref 32.0–36.0)
MCV: 93.5 fL (ref 80.0–100.0)
METAMYELOCYTES PCT: 0 %
MONO ABS: 0.5 10*3/uL (ref 0.2–1.0)
MYELOCYTES: 0 %
Monocytes Relative: 14 %
NEUTROS ABS: 1.8 10*3/uL (ref 1.4–6.5)
NEUTROS PCT: 51 %
Other: 0 %
PLATELETS: 168 10*3/uL (ref 150–440)
PROMYELOCYTES ABS: 0 %
RBC: 3.5 MIL/uL — ABNORMAL LOW (ref 4.40–5.90)
RDW: 13.4 % (ref 11.5–14.5)
WBC: 3.6 10*3/uL — ABNORMAL LOW (ref 3.8–10.6)
nRBC: 0 /100 WBC

## 2016-10-09 LAB — BASIC METABOLIC PANEL
Anion gap: 5 (ref 5–15)
BUN: 44 mg/dL — AB (ref 6–20)
CALCIUM: 9.7 mg/dL (ref 8.9–10.3)
CO2: 25 mmol/L (ref 22–32)
CREATININE: 1.43 mg/dL — AB (ref 0.61–1.24)
Chloride: 109 mmol/L (ref 101–111)
GFR calc non Af Amer: 53 mL/min — ABNORMAL LOW (ref >60)
GLUCOSE: 197 mg/dL — AB (ref 65–99)
Potassium: 5.5 mmol/L — ABNORMAL HIGH (ref 3.5–5.1)
Sodium: 139 mmol/L (ref 135–145)

## 2016-10-09 LAB — MAGNESIUM: MAGNESIUM: 1.9 mg/dL (ref 1.7–2.4)

## 2016-10-09 LAB — PHOSPHORUS: PHOSPHORUS: 3.2 mg/dL (ref 2.5–4.6)

## 2016-10-19 DIAGNOSIS — H401134 Primary open-angle glaucoma, bilateral, indeterminate stage: Secondary | ICD-10-CM | POA: Diagnosis not present

## 2016-10-26 DIAGNOSIS — H401134 Primary open-angle glaucoma, bilateral, indeterminate stage: Secondary | ICD-10-CM | POA: Diagnosis not present

## 2016-11-26 DIAGNOSIS — E10649 Type 1 diabetes mellitus with hypoglycemia without coma: Secondary | ICD-10-CM | POA: Diagnosis not present

## 2016-11-26 LAB — HEMOGLOBIN A1C: Hemoglobin A1C: 6.5

## 2016-11-30 ENCOUNTER — Other Ambulatory Visit
Admission: RE | Admit: 2016-11-30 | Discharge: 2016-11-30 | Disposition: A | Discharge status: Home / Self Care | Payer: BLUE CROSS/BLUE SHIELD | Source: Ambulatory Visit | Attending: Nephrology | Admitting: Nephrology

## 2016-11-30 DIAGNOSIS — E559 Vitamin D deficiency, unspecified: Secondary | ICD-10-CM | POA: Diagnosis not present

## 2016-11-30 DIAGNOSIS — Z114 Encounter for screening for human immunodeficiency virus [HIV]: Secondary | ICD-10-CM | POA: Diagnosis not present

## 2016-11-30 DIAGNOSIS — Z789 Other specified health status: Secondary | ICD-10-CM | POA: Diagnosis not present

## 2016-11-30 DIAGNOSIS — Z940 Kidney transplant status: Secondary | ICD-10-CM | POA: Diagnosis not present

## 2016-11-30 DIAGNOSIS — E1129 Type 2 diabetes mellitus with other diabetic kidney complication: Secondary | ICD-10-CM | POA: Diagnosis not present

## 2016-11-30 DIAGNOSIS — Z09 Encounter for follow-up examination after completed treatment for conditions other than malignant neoplasm: Secondary | ICD-10-CM | POA: Insufficient documentation

## 2016-11-30 DIAGNOSIS — B259 Cytomegaloviral disease, unspecified: Secondary | ICD-10-CM | POA: Insufficient documentation

## 2016-11-30 DIAGNOSIS — Z79899 Other long term (current) drug therapy: Secondary | ICD-10-CM | POA: Insufficient documentation

## 2016-11-30 DIAGNOSIS — N189 Chronic kidney disease, unspecified: Secondary | ICD-10-CM | POA: Diagnosis not present

## 2016-11-30 DIAGNOSIS — N390 Urinary tract infection, site not specified: Secondary | ICD-10-CM | POA: Diagnosis not present

## 2016-11-30 DIAGNOSIS — D899 Disorder involving the immune mechanism, unspecified: Secondary | ICD-10-CM | POA: Insufficient documentation

## 2016-11-30 DIAGNOSIS — Z9483 Pancreas transplant status: Secondary | ICD-10-CM | POA: Diagnosis not present

## 2016-11-30 DIAGNOSIS — D631 Anemia in chronic kidney disease: Secondary | ICD-10-CM | POA: Insufficient documentation

## 2016-11-30 LAB — CBC WITH DIFFERENTIAL/PLATELET
Basophils Absolute: 0 10*3/uL (ref 0–0.1)
Basophils Relative: 1 %
EOS ABS: 0.6 10*3/uL (ref 0–0.7)
Eosinophils Relative: 19 %
HCT: 32.8 % — ABNORMAL LOW (ref 40.0–52.0)
HEMOGLOBIN: 10.5 g/dL — AB (ref 13.0–18.0)
LYMPHS ABS: 0.9 10*3/uL — AB (ref 1.0–3.6)
Lymphocytes Relative: 27 %
MCH: 30 pg (ref 26.0–34.0)
MCHC: 32 g/dL (ref 32.0–36.0)
MCV: 93.8 fL (ref 80.0–100.0)
MONOS PCT: 11 %
Monocytes Absolute: 0.3 10*3/uL (ref 0.2–1.0)
NEUTROS PCT: 42 %
Neutro Abs: 1.4 10*3/uL (ref 1.4–6.5)
Platelets: 183 10*3/uL (ref 150–440)
RBC: 3.5 MIL/uL — ABNORMAL LOW (ref 4.40–5.90)
RDW: 13.2 % (ref 11.5–14.5)
WBC: 3.2 10*3/uL — ABNORMAL LOW (ref 3.8–10.6)

## 2016-11-30 LAB — BASIC METABOLIC PANEL
Anion gap: 6 (ref 5–15)
BUN: 30 mg/dL — AB (ref 6–20)
CHLORIDE: 105 mmol/L (ref 101–111)
CO2: 24 mmol/L (ref 22–32)
CREATININE: 1.18 mg/dL (ref 0.61–1.24)
Calcium: 9.2 mg/dL (ref 8.9–10.3)
GFR calc Af Amer: >60 mL/min (ref >60)
GFR calc non Af Amer: >60 mL/min (ref >60)
GLUCOSE: 140 mg/dL — AB (ref 65–99)
Potassium: 4.8 mmol/L (ref 3.5–5.1)
SODIUM: 135 mmol/L (ref 135–145)

## 2016-11-30 LAB — PHOSPHORUS: Phosphorus: 2.5 mg/dL (ref 2.5–4.6)

## 2016-11-30 LAB — MAGNESIUM: Magnesium: 1.7 mg/dL (ref 1.7–2.4)

## 2016-12-03 DIAGNOSIS — E785 Hyperlipidemia, unspecified: Secondary | ICD-10-CM | POA: Diagnosis not present

## 2016-12-03 DIAGNOSIS — Z940 Kidney transplant status: Secondary | ICD-10-CM | POA: Diagnosis not present

## 2016-12-03 DIAGNOSIS — E10649 Type 1 diabetes mellitus with hypoglycemia without coma: Secondary | ICD-10-CM | POA: Diagnosis not present

## 2016-12-04 DIAGNOSIS — E10641 Type 1 diabetes mellitus with hypoglycemia with coma: Secondary | ICD-10-CM | POA: Diagnosis not present

## 2016-12-04 DIAGNOSIS — E1021 Type 1 diabetes mellitus with diabetic nephropathy: Secondary | ICD-10-CM | POA: Diagnosis not present

## 2016-12-04 DIAGNOSIS — E10649 Type 1 diabetes mellitus with hypoglycemia without coma: Secondary | ICD-10-CM | POA: Diagnosis not present

## 2017-01-11 DIAGNOSIS — Z85828 Personal history of other malignant neoplasm of skin: Secondary | ICD-10-CM | POA: Diagnosis not present

## 2017-01-11 DIAGNOSIS — D485 Neoplasm of uncertain behavior of skin: Secondary | ICD-10-CM | POA: Diagnosis not present

## 2017-01-11 DIAGNOSIS — Z86018 Personal history of other benign neoplasm: Secondary | ICD-10-CM | POA: Diagnosis not present

## 2017-01-11 DIAGNOSIS — Z872 Personal history of diseases of the skin and subcutaneous tissue: Secondary | ICD-10-CM | POA: Diagnosis not present

## 2017-01-13 ENCOUNTER — Other Ambulatory Visit
Admission: RE | Admit: 2017-01-13 | Discharge: 2017-01-13 | Disposition: A | Discharge status: Home / Self Care | Payer: BLUE CROSS/BLUE SHIELD | Source: Ambulatory Visit | Attending: Nephrology | Admitting: Nephrology

## 2017-01-13 DIAGNOSIS — Z940 Kidney transplant status: Secondary | ICD-10-CM | POA: Insufficient documentation

## 2017-01-13 DIAGNOSIS — Z9483 Pancreas transplant status: Secondary | ICD-10-CM | POA: Insufficient documentation

## 2017-01-13 DIAGNOSIS — D899 Disorder involving the immune mechanism, unspecified: Secondary | ICD-10-CM | POA: Insufficient documentation

## 2017-01-13 DIAGNOSIS — E559 Vitamin D deficiency, unspecified: Secondary | ICD-10-CM | POA: Insufficient documentation

## 2017-01-13 DIAGNOSIS — Z79899 Other long term (current) drug therapy: Secondary | ICD-10-CM | POA: Insufficient documentation

## 2017-01-13 DIAGNOSIS — E1129 Type 2 diabetes mellitus with other diabetic kidney complication: Secondary | ICD-10-CM | POA: Insufficient documentation

## 2017-01-13 DIAGNOSIS — B259 Cytomegaloviral disease, unspecified: Secondary | ICD-10-CM | POA: Insufficient documentation

## 2017-01-13 DIAGNOSIS — N390 Urinary tract infection, site not specified: Secondary | ICD-10-CM | POA: Insufficient documentation

## 2017-01-13 DIAGNOSIS — Z09 Encounter for follow-up examination after completed treatment for conditions other than malignant neoplasm: Secondary | ICD-10-CM | POA: Diagnosis not present

## 2017-01-13 DIAGNOSIS — Z114 Encounter for screening for human immunodeficiency virus [HIV]: Secondary | ICD-10-CM | POA: Diagnosis not present

## 2017-01-13 DIAGNOSIS — N189 Chronic kidney disease, unspecified: Secondary | ICD-10-CM | POA: Insufficient documentation

## 2017-01-13 DIAGNOSIS — D631 Anemia in chronic kidney disease: Secondary | ICD-10-CM | POA: Diagnosis not present

## 2017-01-13 DIAGNOSIS — Z789 Other specified health status: Secondary | ICD-10-CM | POA: Diagnosis not present

## 2017-01-13 LAB — CBC WITH DIFFERENTIAL/PLATELET
Basophils Absolute: 0.1 10*3/uL (ref 0–0.1)
Basophils Relative: 2 %
Eosinophils Absolute: 0.5 10*3/uL (ref 0–0.7)
Eosinophils Relative: 13 %
HEMATOCRIT: 32.7 % — AB (ref 40.0–52.0)
Hemoglobin: 10.5 g/dL — ABNORMAL LOW (ref 13.0–18.0)
LYMPHS ABS: 0.7 10*3/uL — AB (ref 1.0–3.6)
Lymphocytes Relative: 21 %
MCH: 30.3 pg (ref 26.0–34.0)
MCHC: 32.1 g/dL (ref 32.0–36.0)
MCV: 94.6 fL (ref 80.0–100.0)
MONO ABS: 0.4 10*3/uL (ref 0.2–1.0)
MONOS PCT: 11 %
NEUTROS ABS: 1.9 10*3/uL (ref 1.4–6.5)
Neutrophils Relative %: 53 %
Platelets: 188 10*3/uL (ref 150–440)
RBC: 3.45 MIL/uL — ABNORMAL LOW (ref 4.40–5.90)
RDW: 13.7 % (ref 11.5–14.5)
WBC: 3.5 10*3/uL — ABNORMAL LOW (ref 3.8–10.6)

## 2017-01-13 LAB — BASIC METABOLIC PANEL
Anion gap: 6 (ref 5–15)
BUN: 43 mg/dL — AB (ref 6–20)
CHLORIDE: 107 mmol/L (ref 101–111)
CO2: 23 mmol/L (ref 22–32)
CREATININE: 1.28 mg/dL — AB (ref 0.61–1.24)
Calcium: 9.6 mg/dL (ref 8.9–10.3)
GFR calc Af Amer: >60 mL/min (ref >60)
GFR calc non Af Amer: >60 mL/min (ref >60)
GLUCOSE: 208 mg/dL — AB (ref 65–99)
Potassium: 5.2 mmol/L — ABNORMAL HIGH (ref 3.5–5.1)
Sodium: 136 mmol/L (ref 135–145)

## 2017-01-13 LAB — MAGNESIUM: Magnesium: 1.9 mg/dL (ref 1.7–2.4)

## 2017-01-13 LAB — PHOSPHORUS: Phosphorus: 2.7 mg/dL (ref 2.5–4.6)

## 2017-02-04 ENCOUNTER — Telehealth: Payer: Self-pay | Admitting: *Deleted

## 2017-02-04 NOTE — Telephone Encounter (Signed)
Patient is returning call to [facility]. [phone]

## 2017-02-04 NOTE — Telephone Encounter (Signed)
Message in the wrong chart, this has been corrected-aa

## 2017-02-08 DIAGNOSIS — D229 Melanocytic nevi, unspecified: Secondary | ICD-10-CM | POA: Diagnosis not present

## 2017-02-08 DIAGNOSIS — D485 Neoplasm of uncertain behavior of skin: Secondary | ICD-10-CM | POA: Diagnosis not present

## 2017-02-16 DIAGNOSIS — Z940 Kidney transplant status: Secondary | ICD-10-CM | POA: Diagnosis not present

## 2017-02-16 DIAGNOSIS — Z6821 Body mass index (BMI) 21.0-21.9, adult: Secondary | ICD-10-CM | POA: Diagnosis not present

## 2017-02-16 DIAGNOSIS — R42 Dizziness and giddiness: Secondary | ICD-10-CM | POA: Diagnosis not present

## 2017-02-16 DIAGNOSIS — I10 Essential (primary) hypertension: Secondary | ICD-10-CM | POA: Diagnosis not present

## 2017-02-16 DIAGNOSIS — E109 Type 1 diabetes mellitus without complications: Secondary | ICD-10-CM | POA: Diagnosis not present

## 2017-02-16 DIAGNOSIS — Z79899 Other long term (current) drug therapy: Secondary | ICD-10-CM | POA: Diagnosis not present

## 2017-02-16 DIAGNOSIS — Z86718 Personal history of other venous thrombosis and embolism: Secondary | ICD-10-CM | POA: Diagnosis not present

## 2017-02-16 DIAGNOSIS — D899 Disorder involving the immune mechanism, unspecified: Secondary | ICD-10-CM | POA: Diagnosis not present

## 2017-02-16 DIAGNOSIS — R600 Localized edema: Secondary | ICD-10-CM | POA: Diagnosis not present

## 2017-02-16 DIAGNOSIS — D649 Anemia, unspecified: Secondary | ICD-10-CM | POA: Diagnosis not present

## 2017-02-16 DIAGNOSIS — E785 Hyperlipidemia, unspecified: Secondary | ICD-10-CM | POA: Diagnosis not present

## 2017-02-16 DIAGNOSIS — Z5181 Encounter for therapeutic drug level monitoring: Secondary | ICD-10-CM | POA: Diagnosis not present

## 2017-02-16 DIAGNOSIS — E875 Hyperkalemia: Secondary | ICD-10-CM | POA: Diagnosis not present

## 2017-02-24 ENCOUNTER — Other Ambulatory Visit
Admission: RE | Admit: 2017-02-24 | Discharge: 2017-02-24 | Disposition: A | Discharge status: Home / Self Care | Payer: BLUE CROSS/BLUE SHIELD | Source: Ambulatory Visit | Attending: Nephrology | Admitting: Nephrology

## 2017-02-24 DIAGNOSIS — E1129 Type 2 diabetes mellitus with other diabetic kidney complication: Secondary | ICD-10-CM | POA: Diagnosis not present

## 2017-02-24 DIAGNOSIS — Z79899 Other long term (current) drug therapy: Secondary | ICD-10-CM | POA: Insufficient documentation

## 2017-02-24 DIAGNOSIS — Z9483 Pancreas transplant status: Secondary | ICD-10-CM | POA: Insufficient documentation

## 2017-02-24 DIAGNOSIS — B259 Cytomegaloviral disease, unspecified: Secondary | ICD-10-CM | POA: Diagnosis not present

## 2017-02-24 DIAGNOSIS — E559 Vitamin D deficiency, unspecified: Secondary | ICD-10-CM | POA: Diagnosis not present

## 2017-02-24 DIAGNOSIS — D631 Anemia in chronic kidney disease: Secondary | ICD-10-CM | POA: Diagnosis not present

## 2017-02-24 DIAGNOSIS — N390 Urinary tract infection, site not specified: Secondary | ICD-10-CM | POA: Insufficient documentation

## 2017-02-24 DIAGNOSIS — Z09 Encounter for follow-up examination after completed treatment for conditions other than malignant neoplasm: Secondary | ICD-10-CM | POA: Diagnosis not present

## 2017-02-24 DIAGNOSIS — N189 Chronic kidney disease, unspecified: Secondary | ICD-10-CM | POA: Insufficient documentation

## 2017-02-24 DIAGNOSIS — Z114 Encounter for screening for human immunodeficiency virus [HIV]: Secondary | ICD-10-CM | POA: Diagnosis not present

## 2017-02-24 DIAGNOSIS — D899 Disorder involving the immune mechanism, unspecified: Secondary | ICD-10-CM | POA: Insufficient documentation

## 2017-02-24 DIAGNOSIS — Z940 Kidney transplant status: Secondary | ICD-10-CM | POA: Insufficient documentation

## 2017-02-24 DIAGNOSIS — Z789 Other specified health status: Secondary | ICD-10-CM | POA: Insufficient documentation

## 2017-02-24 LAB — CBC WITH DIFFERENTIAL/PLATELET
BASOS PCT: 3 %
Basophils Absolute: 0.1 10*3/uL (ref 0–0.1)
Eosinophils Absolute: 0.8 10*3/uL — ABNORMAL HIGH (ref 0–0.7)
Eosinophils Relative: 20 %
HEMATOCRIT: 30 % — AB (ref 40.0–52.0)
HEMOGLOBIN: 9.6 g/dL — AB (ref 13.0–18.0)
LYMPHS PCT: 20 %
Lymphs Abs: 0.7 10*3/uL — ABNORMAL LOW (ref 1.0–3.6)
MCH: 30.2 pg (ref 26.0–34.0)
MCHC: 32 g/dL (ref 32.0–36.0)
MCV: 94.4 fL (ref 80.0–100.0)
MONOS PCT: 12 %
Monocytes Absolute: 0.4 10*3/uL (ref 0.2–1.0)
NEUTROS ABS: 1.7 10*3/uL (ref 1.4–6.5)
NEUTROS PCT: 45 %
Platelets: 172 10*3/uL (ref 150–440)
RBC: 3.18 MIL/uL — ABNORMAL LOW (ref 4.40–5.90)
RDW: 13.6 % (ref 11.5–14.5)
WBC: 3.7 10*3/uL — ABNORMAL LOW (ref 3.8–10.6)

## 2017-02-24 LAB — BASIC METABOLIC PANEL
Anion gap: 5 (ref 5–15)
BUN: 39 mg/dL — AB (ref 6–20)
CHLORIDE: 108 mmol/L (ref 101–111)
CO2: 25 mmol/L (ref 22–32)
Calcium: 9.4 mg/dL (ref 8.9–10.3)
Creatinine, Ser: 1.44 mg/dL — ABNORMAL HIGH (ref 0.61–1.24)
GFR calc Af Amer: >60 mL/min (ref >60)
GFR calc non Af Amer: 52 mL/min — ABNORMAL LOW (ref >60)
Glucose, Bld: 138 mg/dL — ABNORMAL HIGH (ref 65–99)
POTASSIUM: 4.5 mmol/L (ref 3.5–5.1)
SODIUM: 138 mmol/L (ref 135–145)

## 2017-02-24 LAB — PHOSPHORUS: PHOSPHORUS: 3.3 mg/dL (ref 2.5–4.6)

## 2017-02-24 LAB — MAGNESIUM: Magnesium: 1.8 mg/dL (ref 1.7–2.4)

## 2017-03-04 DIAGNOSIS — E1021 Type 1 diabetes mellitus with diabetic nephropathy: Secondary | ICD-10-CM | POA: Diagnosis not present

## 2017-03-04 DIAGNOSIS — E10649 Type 1 diabetes mellitus with hypoglycemia without coma: Secondary | ICD-10-CM | POA: Diagnosis not present

## 2017-03-04 DIAGNOSIS — E10641 Type 1 diabetes mellitus with hypoglycemia with coma: Secondary | ICD-10-CM | POA: Diagnosis not present

## 2017-03-11 ENCOUNTER — Encounter: Payer: Self-pay | Admitting: Physician Assistant

## 2017-03-11 ENCOUNTER — Ambulatory Visit (INDEPENDENT_AMBULATORY_CARE_PROVIDER_SITE_OTHER): Payer: BLUE CROSS/BLUE SHIELD | Admitting: Physician Assistant

## 2017-03-11 VITALS — BP 136/70 | HR 76 | Temp 99.4°F | Resp 16 | Wt 152.0 lb

## 2017-03-11 DIAGNOSIS — Z940 Kidney transplant status: Secondary | ICD-10-CM | POA: Diagnosis not present

## 2017-03-11 DIAGNOSIS — N184 Chronic kidney disease, stage 4 (severe): Secondary | ICD-10-CM

## 2017-03-11 DIAGNOSIS — E1022 Type 1 diabetes mellitus with diabetic chronic kidney disease: Secondary | ICD-10-CM

## 2017-03-11 DIAGNOSIS — J101 Influenza due to other identified influenza virus with other respiratory manifestations: Secondary | ICD-10-CM

## 2017-03-11 MED ORDER — OSELTAMIVIR PHOSPHATE 75 MG PO CAPS: 75.0000 mg | ORAL_CAPSULE | Freq: Two times a day (BID) | ORAL | 0 refills | Status: AC

## 2017-03-11 NOTE — Progress Notes (Signed)
[place]  Chief Complaint  Patient presents with  . URI    Subjective:    Patient ID: Rick Wang, male    DOB: 12/11/1956, 61 y.o.   MRN: [id number]  Upper Respiratory Infection: Rick Wang is a 61 y.o. 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 Symptoms include congestion, cough and sore throat. Onset of symptoms was 2 days ago, gradually improving since that time. He also c/o cough described as nonproductive, lightheadedness, non productive cough and post nasal drip for the past 2 days .  He is not drinking much. Evaluation to date: none. Treatment to date: none. Patient reports his sugars have been running in the 150s-200s. No polydipsia, polyuria, confusion, nausea, vomiting.   Review of Systems  Constitutional: Positive for chills and fatigue. Negative for activity change, appetite change, diaphoresis, fever and unexpected weight change.  HENT: Positive for congestion, postnasal drip, rhinorrhea, sinus pain, sinus pressure, sneezing, sore throat and trouble swallowing. Negative for ear discharge, ear pain, mouth sores, nosebleeds and tinnitus.   Eyes: Positive for discharge. Negative for photophobia, pain, redness, itching and visual disturbance.  Respiratory: Positive for cough and wheezing. Negative for apnea, choking, chest tightness, shortness of breath and stridor.   Gastrointestinal: Negative.   Musculoskeletal: Positive for myalgias.  Neurological: Positive for light-headedness. Negative for dizziness and headaches.       Objective:   BP 136/70 (BP Location: Right Arm, Patient Position: Sitting, Cuff Size: Normal)   Pulse 76   Temp 99.4 F (37.4 C) (Oral)   Resp 16   Wt 152 lb (68.9 kg)   BMI 21.81 kg/m   Patient Active Problem List   Diagnosis Date Noted  . Special screening for malignant neoplasms, colon   . Type I diabetes  mellitus ([place]) 02/03/2016  . Diabetic retinopathy ([place]) 02/03/2016  . 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 End-stage renal disease ([place]) 02/03/2016  . Glaucoma 02/03/2016  . HLD (hyperlipidemia) 02/03/2016  . Kidney failure 02/03/2016  . Basal cell carcinoma of back 02/25/2015  . H/O thrombosis 02/12/2012  . BP (high blood pressure) 01/13/2011  . H/O kidney transplant 07/08/2010    Outpatient Encounter Prescriptions as of 03/11/2017  Medication Sig Note  . aspirin 81 MG tablet Take by mouth. 02/03/2016: Received from: [facility]  . atorvastatin (LIPITOR) 20 MG tablet Take by mouth. 02/03/2016: Received from: [facility]  . Brinzolamide-Brimonidine [facility]) 1-0.2 % SUSP Apply to eye. 02/03/2016: Received from: [facility]  . docusate sodium (COLACE) 100 MG capsule Take by mouth. 02/03/2016: Medication taken as needed.  Received from: [facility]  . ferrous sulfate 325 (65 FE) MG tablet Take by mouth. 02/03/2016: Received from: [facility]  . Insulin Glargine (TOUJEO SOLOSTAR) 300 UNIT/ML SOPN Inject 16 Units into the skin daily.  02/03/2016: Received from: [facility]  . Insulin Syringe-Needle U-100 (BD INSULIN SYRINGE ULTRAFINE) 31G X 5/16" 0.5 ML MISC  02/03/2016: Received from: [facility]  . mycophenolate (MYFORTIC) 180 MG EC tablet Take by mouth. 02/03/2016: Received from: [facility]  . NOVOLOG 100 UNIT/ML injection INJECT 7 UNITS UNDER THE SKIN BEFORE BREAKFAST, 5 UNITS BEFORE LUNCH AND 4 UNITS BEFORE DINNER   . ONETOUCH DELICA LANCETS FINE MISC  02/03/2016: Received from: [facility]  . Polyethylene Glycol 3350 GRAN Take by mouth. 02/03/2016: Medication  taken as needed.  Received from: [facility]  . tacrolimus (PROGRAF) 1 MG capsule  02/03/2016: Received from: [facility]  . furosemide (LASIX) 20 MG tablet Take 20 mg by mouth.  02/03/2016: Received from: [facility]  . lisinopril (PRINIVIL,ZESTRIL) 40 MG tablet Take 40 mg by mouth. 02/03/2016: Received from: [facility]  . oseltamivir (TAMIFLU) 75 MG capsule Take 1 capsule (75 mg total) by mouth 2 (two) times daily.    No facility-administered encounter medications on file as of 03/11/2017.     No Known Allergies     Physical Exam  Constitutional: He is oriented to person, place, and time. He appears well-developed and well-nourished.  HENT:  Mouth/Throat: Posterior oropharyngeal edema and posterior oropharyngeal erythema present. No oropharyngeal exudate.  Eyes: Conjunctivae are normal.  Neck: Neck supple.  Cardiovascular: Normal rate, regular rhythm and normal heart sounds.   Pulmonary/Chest: Effort normal and breath sounds normal. No respiratory distress. He has no wheezes. He has no rales.  Lymphadenopathy:    He has no cervical adenopathy.  Neurological: He is alert and oriented to person, place, and time.  Skin: Skin is warm and dry.  Psychiatric: He has a normal mood and affect. His behavior is normal.       Assessment & Plan:  1. Influenza A  Rapid flu swab in office positive for influenza A. Consulted with Dr. Rosanna Randy about appropriate Tamiflu dosing considering his renal function and will treat as below. Also prescribed prophylaxis for patient's wife, who is a patient here as well. Patient agrees that if he doesn't see significant improvement by Monday that he will call for visit.  - oseltamivir (TAMIFLU) 75 MG capsule; Take 1 capsule (75 mg total) by mouth 2 (two) times daily.  Dispense: 10 capsule; Refill: 0  2. 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  3. H/O kidney transplant  See above.  Return if symptoms worsen or fail to improve.   Patient Instructions  Influenza, Adult Influenza ("the flu") is an infection in the lungs, nose, and throat (respiratory tract). It  is caused by a virus. The flu causes many common cold symptoms, as well as a high fever and body aches. It can make you feel very sick. The flu spreads easily from person to person (is contagious). Getting a flu shot (influenza vaccination) every year is the best way to prevent the flu. Follow these instructions at home:  Take over-the-counter and prescription medicines only as told by your doctor.  Use a cool mist humidifier to add moisture (humidity) to the air in your home. This can make it easier to breathe.  Rest as needed.  Drink enough fluid to keep your pee (urine) clear or pale yellow.  Cover your mouth and nose when you cough or sneeze.  Wash your hands with soap and water often, especially after you cough or sneeze. If you cannot use soap and water, use hand sanitizer.  Stay home from work or school as told by your doctor. Unless you are visiting your doctor, try to avoid leaving home until your fever has been gone for 24 hours without the use of medicine.  Keep all follow-up visits as told by your doctor. This is important. How is this prevented?  Getting a yearly (annual) flu shot is the best way to avoid getting the flu. You may get the flu shot in late summer, fall, or winter. Ask your doctor  when you should get your flu shot.  Wash your hands often or use hand sanitizer often.  Avoid contact with people who are sick during cold and flu season.  Eat healthy foods.  Drink plenty of fluids.  Get enough sleep.  Exercise regularly. Contact a doctor if:  You get new symptoms.  You have:  Chest pain.  Watery poop (diarrhea).  A fever.  Your cough gets worse.  You start to have more mucus.  You feel sick to your stomach (nauseous).  You throw up (vomit). Get help right away if:  You start to be short of breath or have trouble breathing.  Your skin or nails turn a bluish color.  You have very bad pain or stiffness in your neck.  You get a sudden  headache.  You get sudden pain in your face or ear.  You cannot stop throwing up. This information is not intended to replace advice given to you by your health care provider. Make sure you discuss any questions you have with your health care provider. Document Released: 09/08/2008 Document Revised: 05/07/2016 Document Reviewed: 09/24/2015 Elsevier Interactive Patient Education  2017 [employer].     The entirety of the information documented in the History of Present Illness, Review of Systems and Physical Exam were personally obtained by me. Portions of this information were initially documented by Ashley Royalty, CMA and reviewed by me for thoroughness and accuracy.

## 2017-03-11 NOTE — Patient Instructions (Signed)

## 2017-04-01 ENCOUNTER — Other Ambulatory Visit
Admission: RE | Admit: 2017-04-01 | Discharge: 2017-04-01 | Disposition: A | Discharge status: Home / Self Care | Payer: BLUE CROSS/BLUE SHIELD | Source: Ambulatory Visit | Attending: Nephrology | Admitting: Nephrology

## 2017-04-01 DIAGNOSIS — E559 Vitamin D deficiency, unspecified: Secondary | ICD-10-CM | POA: Insufficient documentation

## 2017-04-01 DIAGNOSIS — N390 Urinary tract infection, site not specified: Secondary | ICD-10-CM | POA: Insufficient documentation

## 2017-04-01 DIAGNOSIS — Z9483 Pancreas transplant status: Secondary | ICD-10-CM | POA: Diagnosis not present

## 2017-04-01 DIAGNOSIS — Z114 Encounter for screening for human immunodeficiency virus [HIV]: Secondary | ICD-10-CM | POA: Insufficient documentation

## 2017-04-01 DIAGNOSIS — Z09 Encounter for follow-up examination after completed treatment for conditions other than malignant neoplasm: Secondary | ICD-10-CM | POA: Insufficient documentation

## 2017-04-01 DIAGNOSIS — Z789 Other specified health status: Secondary | ICD-10-CM | POA: Diagnosis not present

## 2017-04-01 DIAGNOSIS — E1129 Type 2 diabetes mellitus with other diabetic kidney complication: Secondary | ICD-10-CM | POA: Insufficient documentation

## 2017-04-01 DIAGNOSIS — D899 Disorder involving the immune mechanism, unspecified: Secondary | ICD-10-CM | POA: Insufficient documentation

## 2017-04-01 DIAGNOSIS — B259 Cytomegaloviral disease, unspecified: Secondary | ICD-10-CM | POA: Insufficient documentation

## 2017-04-01 DIAGNOSIS — D631 Anemia in chronic kidney disease: Secondary | ICD-10-CM | POA: Insufficient documentation

## 2017-04-01 DIAGNOSIS — N186 End stage renal disease: Secondary | ICD-10-CM | POA: Insufficient documentation

## 2017-04-01 DIAGNOSIS — Z940 Kidney transplant status: Secondary | ICD-10-CM | POA: Insufficient documentation

## 2017-04-01 DIAGNOSIS — Z79899 Other long term (current) drug therapy: Secondary | ICD-10-CM | POA: Insufficient documentation

## 2017-04-01 LAB — CBC WITH DIFFERENTIAL/PLATELET
BASOS PCT: 1 %
Basophils Absolute: 0.1 10*3/uL (ref 0–0.1)
Eosinophils Absolute: 0.5 10*3/uL (ref 0–0.7)
Eosinophils Relative: 14 %
HEMATOCRIT: 30.1 % — AB (ref 40.0–52.0)
HEMOGLOBIN: 9.7 g/dL — AB (ref 13.0–18.0)
Lymphocytes Relative: 18 %
Lymphs Abs: 0.7 10*3/uL — ABNORMAL LOW (ref 1.0–3.6)
MCH: 30.3 pg (ref 26.0–34.0)
MCHC: 32.1 g/dL (ref 32.0–36.0)
MCV: 94.6 fL (ref 80.0–100.0)
MONOS PCT: 12 %
Monocytes Absolute: 0.4 10*3/uL (ref 0.2–1.0)
Neutro Abs: 2 10*3/uL (ref 1.4–6.5)
Neutrophils Relative %: 55 %
Platelets: 192 10*3/uL (ref 150–440)
RBC: 3.19 MIL/uL — AB (ref 4.40–5.90)
RDW: 13.3 % (ref 11.5–14.5)
WBC: 3.7 10*3/uL — ABNORMAL LOW (ref 3.8–10.6)

## 2017-04-01 LAB — BASIC METABOLIC PANEL
Anion gap: 4 — ABNORMAL LOW (ref 5–15)
BUN: 37 mg/dL — AB (ref 6–20)
CALCIUM: 9.6 mg/dL (ref 8.9–10.3)
CHLORIDE: 109 mmol/L (ref 101–111)
CO2: 26 mmol/L (ref 22–32)
CREATININE: 1.29 mg/dL — AB (ref 0.61–1.24)
GFR, EST NON AFRICAN AMERICAN: 60 mL/min — AB (ref >60)
Glucose, Bld: 105 mg/dL — ABNORMAL HIGH (ref 65–99)
Potassium: 5 mmol/L (ref 3.5–5.1)
SODIUM: 139 mmol/L (ref 135–145)

## 2017-04-01 LAB — PHOSPHORUS: PHOSPHORUS: 3.2 mg/dL (ref 2.5–4.6)

## 2017-04-01 LAB — MAGNESIUM: Magnesium: 1.8 mg/dL (ref 1.7–2.4)

## 2017-04-03 ENCOUNTER — Telehealth: Payer: Self-pay | Admitting: Family Medicine

## 2017-04-03 MED ORDER — INSULIN ASPART 100 UNIT/ML ~~LOC~~ SOLN: SUBCUTANEOUS | 0 refills | Status: DC

## 2017-04-03 NOTE — Telephone Encounter (Signed)
Patient [place] the [place] and [place] his insulin and the bottle busted.  He was able to take his morning dose but will need it before he can eat his next meal.  He would like for you to send in a refill for NOVOLOG 100 UNIT/ML injection to [facility] in [place], [place].

## 2017-04-12 DIAGNOSIS — Z940 Kidney transplant status: Secondary | ICD-10-CM | POA: Diagnosis not present

## 2017-04-12 DIAGNOSIS — M7989 Other specified soft tissue disorders: Secondary | ICD-10-CM | POA: Diagnosis not present

## 2017-04-12 DIAGNOSIS — Z86718 Personal history of other venous thrombosis and embolism: Secondary | ICD-10-CM | POA: Diagnosis not present

## 2017-04-23 ENCOUNTER — Telehealth: Payer: Self-pay

## 2017-04-23 DIAGNOSIS — E1022 Type 1 diabetes mellitus with diabetic chronic kidney disease: Secondary | ICD-10-CM

## 2017-04-23 DIAGNOSIS — N184 Chronic kidney disease, stage 4 (severe): Principal | ICD-10-CM

## 2017-04-23 NOTE — Telephone Encounter (Signed)
Patients wife called the office today requesting referral for patient to see a podiatrist. Wife states that patient is a diabetic and she has noticed that he has been walking "funny", patients wife states that patient PCP is Dr. Rosanna Randy and issue has been brought up before in visit. Wife denies any new sores or wounds to foot, she was advised that Dr. Rosanna Randy is out of the office today, wife is requesting that message be forwarded to another MD today so referral can be ordered. Wife is requesting referral be processed ASAP so patient can see a specialist on Monday. Please advise. KW

## 2017-04-23 NOTE — Telephone Encounter (Signed)
Order entered for podiatry referral.

## 2017-04-26 DIAGNOSIS — H35373 Puckering of macula, bilateral: Secondary | ICD-10-CM | POA: Diagnosis not present

## 2017-04-26 DIAGNOSIS — H401134 Primary open-angle glaucoma, bilateral, indeterminate stage: Secondary | ICD-10-CM | POA: Diagnosis not present

## 2017-04-29 DIAGNOSIS — I890 Lymphedema, not elsewhere classified: Secondary | ICD-10-CM | POA: Diagnosis not present

## 2017-04-29 DIAGNOSIS — M79671 Pain in right foot: Secondary | ICD-10-CM | POA: Diagnosis not present

## 2017-04-29 DIAGNOSIS — E119 Type 2 diabetes mellitus without complications: Secondary | ICD-10-CM | POA: Diagnosis not present

## 2017-04-29 DIAGNOSIS — Z794 Long term (current) use of insulin: Secondary | ICD-10-CM | POA: Diagnosis not present

## 2017-05-07 ENCOUNTER — Other Ambulatory Visit
Admission: RE | Admit: 2017-05-07 | Discharge: 2017-05-07 | Disposition: A | Discharge status: Home / Self Care | Payer: BLUE CROSS/BLUE SHIELD | Source: Ambulatory Visit | Attending: Nephrology | Admitting: Nephrology

## 2017-05-07 DIAGNOSIS — N390 Urinary tract infection, site not specified: Secondary | ICD-10-CM | POA: Insufficient documentation

## 2017-05-07 DIAGNOSIS — Z940 Kidney transplant status: Secondary | ICD-10-CM | POA: Insufficient documentation

## 2017-05-07 DIAGNOSIS — D631 Anemia in chronic kidney disease: Secondary | ICD-10-CM | POA: Diagnosis not present

## 2017-05-07 DIAGNOSIS — Z789 Other specified health status: Secondary | ICD-10-CM | POA: Insufficient documentation

## 2017-05-07 DIAGNOSIS — E559 Vitamin D deficiency, unspecified: Secondary | ICD-10-CM | POA: Diagnosis not present

## 2017-05-07 DIAGNOSIS — Z79899 Other long term (current) drug therapy: Secondary | ICD-10-CM | POA: Diagnosis not present

## 2017-05-07 DIAGNOSIS — Z114 Encounter for screening for human immunodeficiency virus [HIV]: Secondary | ICD-10-CM | POA: Insufficient documentation

## 2017-05-07 DIAGNOSIS — D899 Disorder involving the immune mechanism, unspecified: Secondary | ICD-10-CM | POA: Insufficient documentation

## 2017-05-07 LAB — BASIC METABOLIC PANEL
ANION GAP: 4 — AB (ref 5–15)
BUN: 42 mg/dL — ABNORMAL HIGH (ref 6–20)
CHLORIDE: 109 mmol/L (ref 101–111)
CO2: 28 mmol/L (ref 22–32)
Calcium: 9.3 mg/dL (ref 8.9–10.3)
Creatinine, Ser: 1.44 mg/dL — ABNORMAL HIGH (ref 0.61–1.24)
GFR calc Af Amer: >60 mL/min (ref >60)
GFR calc non Af Amer: 52 mL/min — ABNORMAL LOW (ref >60)
GLUCOSE: 128 mg/dL — AB (ref 65–99)
POTASSIUM: 4.4 mmol/L (ref 3.5–5.1)
Sodium: 141 mmol/L (ref 135–145)

## 2017-05-07 LAB — CBC WITH DIFFERENTIAL/PLATELET
Basophils Absolute: 0.1 10*3/uL (ref 0–0.1)
Basophils Relative: 2 %
Eosinophils Absolute: 1 10*3/uL — ABNORMAL HIGH (ref 0–0.7)
Eosinophils Relative: 26 %
HCT: 31 % — ABNORMAL LOW (ref 40.0–52.0)
Hemoglobin: 10 g/dL — ABNORMAL LOW (ref 13.0–18.0)
LYMPHS ABS: 0.9 10*3/uL — AB (ref 1.0–3.6)
LYMPHS PCT: 22 %
MCH: 30.1 pg (ref 26.0–34.0)
MCHC: 32.2 g/dL (ref 32.0–36.0)
MCV: 93.3 fL (ref 80.0–100.0)
Monocytes Absolute: 0.4 10*3/uL (ref 0.2–1.0)
Monocytes Relative: 11 %
NEUTROS ABS: 1.6 10*3/uL (ref 1.4–6.5)
Neutrophils Relative %: 39 %
Platelets: 195 10*3/uL (ref 150–440)
RBC: 3.32 MIL/uL — AB (ref 4.40–5.90)
RDW: 13.3 % (ref 11.5–14.5)
WBC: 4 10*3/uL (ref 3.8–10.6)

## 2017-05-07 LAB — MAGNESIUM: Magnesium: 1.7 mg/dL (ref 1.7–2.4)

## 2017-05-07 LAB — PHOSPHORUS: Phosphorus: 3.5 mg/dL (ref 2.5–4.6)

## 2017-05-09 DIAGNOSIS — E162 Hypoglycemia, unspecified: Secondary | ICD-10-CM | POA: Diagnosis not present

## 2017-05-31 ENCOUNTER — Encounter (INDEPENDENT_AMBULATORY_CARE_PROVIDER_SITE_OTHER): Payer: Self-pay | Admitting: Vascular Surgery

## 2017-05-31 ENCOUNTER — Ambulatory Visit (INDEPENDENT_AMBULATORY_CARE_PROVIDER_SITE_OTHER): Payer: BLUE CROSS/BLUE SHIELD | Admitting: Vascular Surgery

## 2017-05-31 VITALS — BP 96/53 | HR 73 | Resp 17 | Ht 69.0 in | Wt 152.4 lb

## 2017-05-31 DIAGNOSIS — I890 Lymphedema, not elsewhere classified: Secondary | ICD-10-CM | POA: Diagnosis not present

## 2017-05-31 DIAGNOSIS — I872 Venous insufficiency (chronic) (peripheral): Secondary | ICD-10-CM | POA: Diagnosis not present

## 2017-05-31 DIAGNOSIS — I10 Essential (primary) hypertension: Secondary | ICD-10-CM | POA: Diagnosis not present

## 2017-05-31 DIAGNOSIS — Z940 Kidney transplant status: Secondary | ICD-10-CM | POA: Diagnosis not present

## 2017-05-31 NOTE — Progress Notes (Signed)
MRN : [id number]  Rick Wang is a 61 y.o. (Nov 07, 1956) male who presents with chief complaint of  Chief Complaint  Patient presents with  . New Patient (Initial Visit)  .  History of Present Illness: Patient is seen for evaluation of leg swelling. The patient first noticed the swelling remotely but is now concerned because of a significant increase in the overall edema. The swelling is associated with some pain and discoloration. The patient notes that in the morning the legs are significantly improved but they steadily worsened throughout the course of the day. Elevation makes the legs better, dependency makes them much worse.   There is no history of ulcerations associated with the swelling.   The patient denies any recent changes in their medications.  The patient has not been wearing graduated compression but he has been wearing TED hose.  The patient has no had any past angiography, interventions or vascular surgery. However, he is s/p kidney transplant in the right pelvis.  The patient denies a history of DVT or PE. There is no prior history of phlebitis. There is no history of primary lymphedema.  There is no history of radiation treatment to the groin or pelvis No history of malignancies. No history of trauma or groin or pelvic surgery. No history of foreign travel or parasitic infections area    Current Meds  Medication Sig  . aspirin 81 MG tablet Take by mouth.  Marland Kitchen atorvastatin (LIPITOR) 20 MG tablet Take by mouth.  . Brinzolamide-Brimonidine (SIMBRINZA) 1-0.2 % SUSP Apply to eye.  . docusate sodium (COLACE) 100 MG capsule Take by mouth.  . ferrous sulfate 325 (65 FE) MG tablet Take by mouth.  . insulin aspart (NOVOLOG) 100 UNIT/ML injection INJECT 7 UNITS UNDER THE SKIN BEFORE BREAKFAST, 5 UNITS BEFORE LUNCH AND 4 UNITS BEFORE DINNER  . Insulin Glargine (TOUJEO SOLOSTAR) 300 UNIT/ML SOPN Inject 16 Units into the skin daily.   . Insulin Syringe-Needle U-100 (BD  INSULIN SYRINGE ULTRAFINE) 31G X 5/16" 0.5 ML MISC   . mycophenolate (MYFORTIC) 180 MG EC tablet Take by mouth.  Glory Rosebush DELICA LANCETS FINE MISC   . Polyethylene Glycol 3350 GRAN Take by mouth.  . SENSIPAR 60 MG tablet   . tacrolimus (PROGRAF) 1 MG capsule     Past Medical History:  Diagnosis Date  . Chronic kidney disease    Bilateral kidney failure. Rcvd new RIGHT kidney.  . Diabetes mellitus without complication ([place])   . Heart murmur    followed by PCP  . Hypercholesteremia   . Hypertension     Past Surgical History:  Procedure Laterality Date  . CATARACT EXTRACTION    . COLONOSCOPY WITH PROPOFOL N/A 04/06/2016   Procedure: COLONOSCOPY WITH PROPOFOL;  Surgeon: Lucilla Lame, MD;  Location: [facility];  Service: Endoscopy;  Laterality: N/A;  . HERNIA REPAIR  3/38/25   umbilical  . PERITONEAL CATHETER INSERTION    . TRANSPLANTATION RENAL Right 09/01/11    Social History Social History  Substance Use Topics  . Smoking status: Never Smoker  . Smokeless tobacco: Never Used  . Alcohol use No    Family History Family History  Problem Relation Age of Onset  . Diabetes Mother   . Congestive Heart Failure Mother   . Prostate cancer Father   . Diabetes Father   . Kidney disease Father   No family history of bleeding/clotting disorders, porphyria or autoimmune disease   No Known Allergies   REVIEW OF  SYSTEMS (Negative unless checked)  Constitutional: [] Weight loss  [] Fever  [] Chills Cardiac: [] Chest pain   [] Chest pressure   [] Palpitations   [] Shortness of breath when laying flat   [] Shortness of breath with exertion. Vascular:  [] Pain in legs with walking   [] Pain in legs at rest  [] History of DVT   [] Phlebitis   [x] Swelling in legs   [] Varicose veins   [] Non-healing ulcers Pulmonary:   [] Uses home oxygen   [] Productive cough   [] Hemoptysis   [] Wheeze  [] COPD   [] Asthma Neurologic:  [] Dizziness   [] Seizures   [] History of stroke   [] History of TIA   [] Aphasia   [] Vissual changes   [] Weakness or numbness in arm   [] Weakness or numbness in leg Musculoskeletal:   [] Joint swelling   [] Joint pain   [] Low back pain Hematologic:  [] Easy bruising  [] Easy bleeding   [] Hypercoagulable state   [] Anemic Gastrointestinal:  [] Diarrhea   [] Vomiting  [] Gastroesophageal reflux/heartburn   [] Difficulty swallowing. Genitourinary:  [x] Chronic kidney disease   [] Difficult urination  [] Frequent urination   [] Blood in urine Skin:  [] Rashes   [] Ulcers  Psychological:  [] History of anxiety   []  History of major depression.  Physical Examination  Vitals:   05/31/17 1452  BP: (!) 96/53  Pulse: 73  Resp: 17  Weight: 152 lb 6.4 oz (69.1 kg)  Height: 5\' 9"  (1.753 m)   Body mass index is 22.51 kg/m. Gen: WD/WN, NAD Head: /AT, No temporalis wasting.  Ear/Nose/Throat: Hearing grossly intact, nares w/o erythema or drainage, poor dentition Eyes: PER, EOMI, sclera nonicteric.  Neck: Supple, no masses.  No bruit or JVD.  Pulmonary:  Good air movement, clear to auscultation bilaterally, no use of accessory muscles.  Cardiac: RRR, normal S1, S2, no Murmurs. Vascular: Vessel Right Left  Radial Palpable Palpable  Ulnar Palpable Palpable  Brachial Palpable Palpable  Carotid Palpable Palpable  Femoral Palpable Palpable  Popliteal Palpable Palpable  PT Palpable Palpable  DP Palpable Palpable   Gastrointestinal: soft, non-distended. No guarding/no peritoneal signs.  Musculoskeletal: M/S 5/5 throughout.  No deformity or atrophy.  Neurologic: CN 2-12 intact. Pain and light touch intact in extremities.  Symmetrical.  Speech is fluent. Motor exam as listed above. Psychiatric: Judgment intact, Mood & affect appropriate for pt's clinical situation. Dermatologic: No rashes or ulcers noted.  No changes consistent with cellulitis. Lymph : No Cervical lymphadenopathy, no lichenification or skin changes of chronic lymphedema.  CBC Lab Results  Component Value Date    WBC 4.0 05/07/2017   HGB 10.0 (L) 05/07/2017   HCT 31.0 (L) 05/07/2017   MCV 93.3 05/07/2017   PLT 195 05/07/2017    BMET    Component Value Date/Time   NA 141 05/07/2017 0838   NA 141 04/03/2015 0836   K 4.4 05/07/2017 0838   K 4.6 04/03/2015 0836   CL 109 05/07/2017 0838   CL 107 04/03/2015 0836   CO2 28 05/07/2017 0838   CO2 27 04/03/2015 0836   GLUCOSE 128 (H) 05/07/2017 0838   GLUCOSE 129 (H) 04/03/2015 0836   BUN 42 (H) 05/07/2017 0838   BUN 39 (H) 04/03/2015 0836   CREATININE 1.44 (H) 05/07/2017 0838   CREATININE 1.36 (H) 04/03/2015 0836   CALCIUM 9.3 05/07/2017 0838   CALCIUM 9.7 04/03/2015 0836   GFRNONAA 52 (L) 05/07/2017 0838   GFRNONAA 58 (L) 04/03/2015 0836   GFRAA >60 05/07/2017 0838   GFRAA >60 04/03/2015 0836   CrCl cannot be calculated (Patient's  most recent lab result is older than the maximum 21 days allowed.).  COAG Lab Results  Component Value Date   INR 0.9 06/20/2012   INR 2.2 03/25/2012   INR 1.4 02/26/2012    Radiology No results found.   Assessment/Plan 1. Lymphedema I have had a long discussion with the patient regarding swelling and why it  causes symptoms.  Patient will begin wearing graduated compression stockings class 1 (20-30 mmHg) on a daily basis a prescription was given. The patient will  beginning wearing the stockings first thing in the morning and removing them in the evening. The patient is instructed specifically not to sleep in the stockings.   In addition, behavioral modification will be initiated.  This will include frequent elevation, use of over the counter pain medications and exercise such as walking.  I have reviewed systemic causes for chronic edema such as liver, kidney and cardiac etiologies.  The patient denies problems with these organ systems.    Consideration for a lymph pump will also be made based upon the effectiveness of conservative therapy.  This would help to improve the edema control and prevent  sequela such as ulcers and infections   Patient should undergo duplex ultrasound of the venous system to ensure that DVT or reflux is not present.  The patient will follow-up with me after the ultrasound.    2. Chronic venous insufficiency See #1  3. Essential hypertension Continue antihypertensive medications as already ordered, these medications have been reviewed and there are no changes at this time.   4. H/O kidney transplant Continue Prograft as already ordered, these medications have been reviewed and there are no changes at this time.     Hortencia Pilar, MD  05/31/2017 9:45 PM

## 2017-06-01 DIAGNOSIS — E10649 Type 1 diabetes mellitus with hypoglycemia without coma: Secondary | ICD-10-CM | POA: Diagnosis not present

## 2017-06-01 DIAGNOSIS — E1021 Type 1 diabetes mellitus with diabetic nephropathy: Secondary | ICD-10-CM | POA: Diagnosis not present

## 2017-06-01 DIAGNOSIS — E10641 Type 1 diabetes mellitus with hypoglycemia with coma: Secondary | ICD-10-CM | POA: Diagnosis not present

## 2017-06-03 DIAGNOSIS — E10649 Type 1 diabetes mellitus with hypoglycemia without coma: Secondary | ICD-10-CM | POA: Diagnosis not present

## 2017-06-14 DIAGNOSIS — E10649 Type 1 diabetes mellitus with hypoglycemia without coma: Secondary | ICD-10-CM | POA: Diagnosis not present

## 2017-06-14 DIAGNOSIS — E785 Hyperlipidemia, unspecified: Secondary | ICD-10-CM | POA: Diagnosis not present

## 2017-06-14 DIAGNOSIS — E1021 Type 1 diabetes mellitus with diabetic nephropathy: Secondary | ICD-10-CM | POA: Diagnosis not present

## 2017-06-14 DIAGNOSIS — Z940 Kidney transplant status: Secondary | ICD-10-CM | POA: Diagnosis not present

## 2017-06-23 DIAGNOSIS — E10641 Type 1 diabetes mellitus with hypoglycemia with coma: Secondary | ICD-10-CM | POA: Diagnosis not present

## 2017-06-23 DIAGNOSIS — E1021 Type 1 diabetes mellitus with diabetic nephropathy: Secondary | ICD-10-CM | POA: Diagnosis not present

## 2017-06-23 DIAGNOSIS — E10649 Type 1 diabetes mellitus with hypoglycemia without coma: Secondary | ICD-10-CM | POA: Diagnosis not present

## 2017-06-29 ENCOUNTER — Other Ambulatory Visit
Admission: RE | Admit: 2017-06-29 | Discharge: 2017-06-29 | Disposition: A | Discharge status: Home / Self Care | Payer: BLUE CROSS/BLUE SHIELD | Source: Ambulatory Visit | Attending: Nephrology | Admitting: Nephrology

## 2017-06-29 ENCOUNTER — Ambulatory Visit
Admission: RE | Admit: 2017-06-29 | Discharge: 2017-06-29 | Disposition: A | Discharge status: Home / Self Care | Payer: BC Managed Care – PPO | Attending: Nephrology | Admitting: Nephrology

## 2017-06-29 DIAGNOSIS — B259 Cytomegaloviral disease, unspecified: Secondary | ICD-10-CM | POA: Diagnosis not present

## 2017-06-29 DIAGNOSIS — D631 Anemia in chronic kidney disease: Secondary | ICD-10-CM | POA: Insufficient documentation

## 2017-06-29 DIAGNOSIS — E559 Vitamin D deficiency, unspecified: Secondary | ICD-10-CM | POA: Diagnosis not present

## 2017-06-29 DIAGNOSIS — Z79899 Other long term (current) drug therapy: Secondary | ICD-10-CM | POA: Insufficient documentation

## 2017-06-29 DIAGNOSIS — D899 Disorder involving the immune mechanism, unspecified: Secondary | ICD-10-CM | POA: Diagnosis not present

## 2017-06-29 DIAGNOSIS — Z9483 Pancreas transplant status: Secondary | ICD-10-CM | POA: Diagnosis not present

## 2017-06-29 DIAGNOSIS — N390 Urinary tract infection, site not specified: Secondary | ICD-10-CM | POA: Diagnosis not present

## 2017-06-29 DIAGNOSIS — Z940 Kidney transplant status: Secondary | ICD-10-CM | POA: Diagnosis not present

## 2017-06-29 DIAGNOSIS — T8610 Unspecified complication of kidney transplant: Secondary | ICD-10-CM | POA: Insufficient documentation

## 2017-06-29 DIAGNOSIS — E1129 Type 2 diabetes mellitus with other diabetic kidney complication: Secondary | ICD-10-CM | POA: Insufficient documentation

## 2017-06-29 DIAGNOSIS — Z09 Encounter for follow-up examination after completed treatment for conditions other than malignant neoplasm: Secondary | ICD-10-CM | POA: Diagnosis not present

## 2017-06-29 DIAGNOSIS — Z114 Encounter for screening for human immunodeficiency virus [HIV]: Secondary | ICD-10-CM | POA: Insufficient documentation

## 2017-06-29 DIAGNOSIS — Z789 Other specified health status: Secondary | ICD-10-CM | POA: Insufficient documentation

## 2017-06-29 LAB — CBC WITH DIFFERENTIAL/PLATELET
BASOS PCT: 2 %
Basophils Absolute: 0.1 10*3/uL (ref 0–0.1)
EOS ABS: 0.8 10*3/uL — AB (ref 0–0.7)
EOS PCT: 20 %
HCT: 31 % — ABNORMAL LOW (ref 40.0–52.0)
HEMOGLOBIN: 10 g/dL — AB (ref 13.0–18.0)
LYMPHS ABS: 0.8 10*3/uL — AB (ref 1.0–3.6)
Lymphocytes Relative: 21 %
MCH: 30.3 pg (ref 26.0–34.0)
MCHC: 32.3 g/dL (ref 32.0–36.0)
MCV: 93.9 fL (ref 80.0–100.0)
MONO ABS: 0.4 10*3/uL (ref 0.2–1.0)
Monocytes Relative: 10 %
Neutro Abs: 1.9 10*3/uL (ref 1.4–6.5)
Neutrophils Relative %: 47 %
PLATELETS: 183 10*3/uL (ref 150–440)
RBC: 3.3 MIL/uL — ABNORMAL LOW (ref 4.40–5.90)
RDW: 13.7 % (ref 11.5–14.5)
WBC: 4.1 10*3/uL (ref 3.8–10.6)

## 2017-06-29 LAB — BASIC METABOLIC PANEL
Anion gap: 7 (ref 5–15)
BUN: 41 mg/dL — AB (ref 6–20)
CALCIUM: 9.4 mg/dL (ref 8.9–10.3)
CHLORIDE: 106 mmol/L (ref 101–111)
CO2: 24 mmol/L (ref 22–32)
CREATININE: 1.47 mg/dL — AB (ref 0.61–1.24)
GFR calc Af Amer: 59 mL/min — ABNORMAL LOW (ref >60)
GFR calc non Af Amer: 51 mL/min — ABNORMAL LOW (ref >60)
Glucose, Bld: 159 mg/dL — ABNORMAL HIGH (ref 65–99)
Potassium: 5.1 mmol/L (ref 3.5–5.1)
SODIUM: 137 mmol/L (ref 135–145)

## 2017-06-29 LAB — PHOSPHORUS: PHOSPHORUS: 3.5 mg/dL (ref 2.5–4.6)

## 2017-06-29 LAB — MAGNESIUM: Magnesium: 2 mg/dL (ref 1.7–2.4)

## 2017-07-02 DIAGNOSIS — Z940 Kidney transplant status: Principal | ICD-10-CM

## 2017-07-12 DIAGNOSIS — Z85828 Personal history of other malignant neoplasm of skin: Secondary | ICD-10-CM | POA: Diagnosis not present

## 2017-07-12 DIAGNOSIS — Z86018 Personal history of other benign neoplasm: Secondary | ICD-10-CM | POA: Diagnosis not present

## 2017-08-02 DIAGNOSIS — E10641 Type 1 diabetes mellitus with hypoglycemia with coma: Secondary | ICD-10-CM | POA: Diagnosis not present

## 2017-08-02 DIAGNOSIS — E10649 Type 1 diabetes mellitus with hypoglycemia without coma: Secondary | ICD-10-CM | POA: Diagnosis not present

## 2017-08-02 DIAGNOSIS — E1021 Type 1 diabetes mellitus with diabetic nephropathy: Secondary | ICD-10-CM | POA: Diagnosis not present

## 2017-08-09 ENCOUNTER — Other Ambulatory Visit
Admission: RE | Admit: 2017-08-09 | Discharge: 2017-08-09 | Disposition: A | Discharge status: Home / Self Care | Payer: BLUE CROSS/BLUE SHIELD | Source: Ambulatory Visit | Attending: Nephrology | Admitting: Nephrology

## 2017-08-09 ENCOUNTER — Ambulatory Visit
Admission: RE | Admit: 2017-08-09 | Discharge: 2017-08-09 | Disposition: A | Discharge status: Home / Self Care | Payer: BC Managed Care – PPO | Attending: Nephrology | Admitting: Nephrology

## 2017-08-09 DIAGNOSIS — Z79899 Other long term (current) drug therapy: Secondary | ICD-10-CM | POA: Diagnosis not present

## 2017-08-09 DIAGNOSIS — Z9483 Pancreas transplant status: Secondary | ICD-10-CM | POA: Diagnosis not present

## 2017-08-09 DIAGNOSIS — B259 Cytomegaloviral disease, unspecified: Secondary | ICD-10-CM | POA: Diagnosis not present

## 2017-08-09 DIAGNOSIS — E1129 Type 2 diabetes mellitus with other diabetic kidney complication: Secondary | ICD-10-CM | POA: Diagnosis not present

## 2017-08-09 DIAGNOSIS — N189 Chronic kidney disease, unspecified: Secondary | ICD-10-CM | POA: Insufficient documentation

## 2017-08-09 DIAGNOSIS — Z940 Kidney transplant status: Secondary | ICD-10-CM | POA: Insufficient documentation

## 2017-08-09 DIAGNOSIS — D631 Anemia in chronic kidney disease: Secondary | ICD-10-CM | POA: Diagnosis not present

## 2017-08-09 DIAGNOSIS — N390 Urinary tract infection, site not specified: Secondary | ICD-10-CM | POA: Diagnosis not present

## 2017-08-09 DIAGNOSIS — E559 Vitamin D deficiency, unspecified: Secondary | ICD-10-CM | POA: Insufficient documentation

## 2017-08-09 DIAGNOSIS — Z114 Encounter for screening for human immunodeficiency virus [HIV]: Secondary | ICD-10-CM | POA: Insufficient documentation

## 2017-08-09 DIAGNOSIS — Z4822 Encounter for aftercare following kidney transplant: Secondary | ICD-10-CM | POA: Diagnosis not present

## 2017-08-09 DIAGNOSIS — D899 Disorder involving the immune mechanism, unspecified: Secondary | ICD-10-CM | POA: Diagnosis not present

## 2017-08-09 LAB — BASIC METABOLIC PANEL
ANION GAP: 7 (ref 5–15)
BUN: 44 mg/dL — ABNORMAL HIGH (ref 6–20)
CALCIUM: 9.4 mg/dL (ref 8.9–10.3)
CO2: 25 mmol/L (ref 22–32)
Chloride: 108 mmol/L (ref 101–111)
Creatinine, Ser: 1.47 mg/dL — ABNORMAL HIGH (ref 0.61–1.24)
GFR calc non Af Amer: 50 mL/min — ABNORMAL LOW (ref >60)
GFR, EST AFRICAN AMERICAN: 59 mL/min — AB (ref >60)
Glucose, Bld: 100 mg/dL — ABNORMAL HIGH (ref 65–99)
POTASSIUM: 4.7 mmol/L (ref 3.5–5.1)
Sodium: 140 mmol/L (ref 135–145)

## 2017-08-09 LAB — CBC WITH DIFFERENTIAL/PLATELET
BASOS ABS: 0.1 10*3/uL (ref 0–0.1)
Basophils Relative: 2 %
Eosinophils Absolute: 0.8 10*3/uL — ABNORMAL HIGH (ref 0–0.7)
Eosinophils Relative: 22 %
HCT: 28.7 % — ABNORMAL LOW (ref 40.0–52.0)
HEMOGLOBIN: 9.3 g/dL — AB (ref 13.0–18.0)
LYMPHS ABS: 0.8 10*3/uL — AB (ref 1.0–3.6)
Lymphocytes Relative: 22 %
MCH: 30.6 pg (ref 26.0–34.0)
MCHC: 32.5 g/dL (ref 32.0–36.0)
MCV: 94.1 fL (ref 80.0–100.0)
MONO ABS: 0.4 10*3/uL (ref 0.2–1.0)
MONOS PCT: 11 %
NEUTROS PCT: 43 %
Neutro Abs: 1.7 10*3/uL (ref 1.4–6.5)
PLATELETS: 150 10*3/uL (ref 150–440)
RBC: 3.05 MIL/uL — ABNORMAL LOW (ref 4.40–5.90)
RDW: 13.7 % (ref 11.5–14.5)
WBC: 3.8 10*3/uL (ref 3.8–10.6)

## 2017-08-09 LAB — MAGNESIUM: Magnesium: 2 mg/dL (ref 1.7–2.4)

## 2017-08-09 LAB — PHOSPHORUS: Phosphorus: 3.4 mg/dL (ref 2.5–4.6)

## 2017-08-12 DIAGNOSIS — Z940 Kidney transplant status: Principal | ICD-10-CM

## 2017-08-13 MED ORDER — SENSIPAR 60 MG TABLET
ORAL_TABLET | PRN refills | 0 days | Status: CP
Start: 2017-08-13 — End: 2019-03-28

## 2017-08-31 MED ORDER — FUROSEMIDE 20 MG TABLET
ORAL_TABLET | 3 refills | 0 days | Status: CP
Start: 2017-08-31 — End: 2017-09-07

## 2017-08-31 MED ORDER — MYCOPHENOLATE SODIUM 360 MG TABLET,DELAYED RELEASE
ORAL_TABLET | 3 refills | 0 days | Status: CP
Start: 2017-08-31 — End: 2018-07-20

## 2017-09-02 ENCOUNTER — Encounter (INDEPENDENT_AMBULATORY_CARE_PROVIDER_SITE_OTHER): Payer: Self-pay | Admitting: Vascular Surgery

## 2017-09-02 ENCOUNTER — Ambulatory Visit (INDEPENDENT_AMBULATORY_CARE_PROVIDER_SITE_OTHER): Payer: BLUE CROSS/BLUE SHIELD | Admitting: Vascular Surgery

## 2017-09-02 ENCOUNTER — Other Ambulatory Visit (INDEPENDENT_AMBULATORY_CARE_PROVIDER_SITE_OTHER): Payer: Self-pay | Admitting: Vascular Surgery

## 2017-09-02 ENCOUNTER — Ambulatory Visit (INDEPENDENT_AMBULATORY_CARE_PROVIDER_SITE_OTHER): Payer: BLUE CROSS/BLUE SHIELD

## 2017-09-02 VITALS — BP 123/69 | HR 68 | Resp 17 | Wt 150.0 lb

## 2017-09-02 DIAGNOSIS — E782 Mixed hyperlipidemia: Secondary | ICD-10-CM

## 2017-09-02 DIAGNOSIS — I890 Lymphedema, not elsewhere classified: Secondary | ICD-10-CM

## 2017-09-02 DIAGNOSIS — I872 Venous insufficiency (chronic) (peripheral): Secondary | ICD-10-CM

## 2017-09-02 DIAGNOSIS — N184 Chronic kidney disease, stage 4 (severe): Secondary | ICD-10-CM | POA: Diagnosis not present

## 2017-09-02 DIAGNOSIS — E1022 Type 1 diabetes mellitus with diabetic chronic kidney disease: Secondary | ICD-10-CM | POA: Diagnosis not present

## 2017-09-06 DIAGNOSIS — E10641 Type 1 diabetes mellitus with hypoglycemia with coma: Secondary | ICD-10-CM | POA: Diagnosis not present

## 2017-09-06 DIAGNOSIS — E1021 Type 1 diabetes mellitus with diabetic nephropathy: Secondary | ICD-10-CM | POA: Diagnosis not present

## 2017-09-06 DIAGNOSIS — E10649 Type 1 diabetes mellitus with hypoglycemia without coma: Secondary | ICD-10-CM | POA: Diagnosis not present

## 2017-09-06 NOTE — Progress Notes (Signed)
MRN : [id number]  Rick Wang is a 61 y.o. (19-Sep-1956) male who presents with chief complaint of  Chief Complaint  Patient presents with  . Follow-up    28month r le dvt  .  History of Present Illness: The patient returns to the office for followup evaluation regarding leg swelling.  The swelling has improved quite a bit and the pain associated with swelling has decreased substantially. There have not been any interval development of a ulcerations or wounds.  Since the previous visit the patient has been wearing graduated compression stockings and has noted little significant improvement in the lymphedema. The patient has been using compression routinely morning until night.  The patient also states elevation during the day and exercise is being done too.     Current Meds  Medication Sig  . aspirin 81 MG tablet Take by mouth.  Marland Kitchen atorvastatin (LIPITOR) 20 MG tablet Take by mouth.  . Brinzolamide-Brimonidine (SIMBRINZA) 1-0.2 % SUSP Apply to eye.  . docusate sodium (COLACE) 100 MG capsule Take by mouth.  . ferrous sulfate 325 (65 FE) MG tablet Take by mouth.  . insulin aspart (NOVOLOG) 100 UNIT/ML injection INJECT 7 UNITS UNDER THE SKIN BEFORE BREAKFAST, 5 UNITS BEFORE LUNCH AND 4 UNITS BEFORE DINNER  . Insulin Glargine (TOUJEO SOLOSTAR) 300 UNIT/ML SOPN Inject 16 Units into the skin daily.   . Insulin Syringe-Needle U-100 (BD INSULIN SYRINGE ULTRAFINE) 31G X 5/16" 0.5 ML MISC   . mycophenolate (MYFORTIC) 180 MG EC tablet Take by mouth.  Glory Rosebush DELICA LANCETS FINE MISC   . Polyethylene Glycol 3350 GRAN Take by mouth.  . SENSIPAR 60 MG tablet   . tacrolimus (PROGRAF) 1 MG capsule     Past Medical History:  Diagnosis Date  . Chronic kidney disease    Bilateral kidney failure. Rcvd new RIGHT kidney.  . Diabetes mellitus without complication ([place])   . Heart murmur    followed by PCP  . Hypercholesteremia   . Hypertension     Past Surgical History:  Procedure  Laterality Date  . CATARACT EXTRACTION    . COLONOSCOPY WITH PROPOFOL N/A 04/06/2016   Procedure: COLONOSCOPY WITH PROPOFOL;  Surgeon: Lucilla Lame, MD;  Location: [facility];  Service: Endoscopy;  Laterality: N/A;  . HERNIA REPAIR  9/62/22   umbilical  . PERITONEAL CATHETER INSERTION    . TRANSPLANTATION RENAL Right 09/01/11    Social History Social History  Substance Use Topics  . Smoking status: Never Smoker  . Smokeless tobacco: Never Used  . Alcohol use No    Family History Family History  Problem Relation Age of Onset  . Diabetes Mother   . Congestive Heart Failure Mother   . Prostate cancer Father   . Diabetes Father   . Kidney disease Father     No Known Allergies   REVIEW OF SYSTEMS (Negative unless checked)  Constitutional: [] Weight loss  [] Fever  [] Chills Cardiac: [] Chest pain   [] Chest pressure   [] Palpitations   [] Shortness of breath when laying flat   [] Shortness of breath with exertion. Vascular:  [] Pain in legs with walking   [] Pain in legs at rest  [] History of DVT   [] Phlebitis   [x] Swelling in legs   [x] Varicose veins   [] Non-healing ulcers Pulmonary:   [] Uses home oxygen   [] Productive cough   [] Hemoptysis   [] Wheeze  [] COPD   [] Asthma Neurologic:  [] Dizziness   [] Seizures   [] History of stroke   [] History of  TIA  [] Aphasia   [] Vissual changes   [] Weakness or numbness in arm   [] Weakness or numbness in leg Musculoskeletal:   [] Joint swelling   [] Joint pain   [] Low back pain Hematologic:  [] Easy bruising  [] Easy bleeding   [] Hypercoagulable state   [] Anemic Gastrointestinal:  [] Diarrhea   [] Vomiting  [] Gastroesophageal reflux/heartburn   [] Difficulty swallowing. Genitourinary:  [] Chronic kidney disease   [] Difficult urination  [] Frequent urination   [] Blood in urine Skin:  [] Rashes   [] Ulcers  Psychological:  [] History of anxiety   []  History of major depression.  Physical Examination  Vitals:   09/02/17 1557  BP: 123/69  Pulse: 68  Resp: 17   Weight: 68 kg (150 lb)   Body mass index is 22.15 kg/m. Gen: WD/WN, NAD Head: [place]/AT, No temporalis wasting.  Ear/Nose/Throat: Hearing grossly intact, nares w/o erythema or drainage Eyes: PER, EOMI, sclera nonicteric.  Neck: Supple, no large masses.   Pulmonary:  Good air movement, no audible wheezing bilaterally, no use of accessory muscles.  Cardiac: RRR, no JVD Vascular: scattered small varicosities present bilaterally.  Mild venous stasis changes to the legs bilaterally.  2+ soft pitting edema Vessel Right Left  Radial Palpable Palpable  PT Palpable Palpable  DP Palpable Palpable  Gastrointestinal: Non-distended. No guarding/no peritoneal signs.  Musculoskeletal: M/S 5/5 throughout.  No deformity or atrophy.  Neurologic: CN 2-12 intact. Symmetrical.  Speech is fluent. Motor exam as listed above. Psychiatric: Judgment intact, Mood & affect appropriate for pt's clinical situation. Dermatologic: venous rashes no ulcers noted.  No changes consistent with cellulitis. Lymph : No lichenification or skin changes of chronic lymphedema.  CBC Lab Results  Component Value Date   WBC 3.8 08/09/2017   HGB 9.3 (L) 08/09/2017   HCT 28.7 (L) 08/09/2017   MCV 94.1 08/09/2017   PLT 150 08/09/2017    BMET    Component Value Date/Time   NA 140 08/09/2017 0845   NA 141 04/03/2015 0836   K 4.7 08/09/2017 0845   K 4.6 04/03/2015 0836   CL 108 08/09/2017 0845   CL 107 04/03/2015 0836   CO2 25 08/09/2017 0845   CO2 27 04/03/2015 0836   GLUCOSE 100 (H) 08/09/2017 0845   GLUCOSE 129 (H) 04/03/2015 0836   BUN 44 (H) 08/09/2017 0845   BUN 39 (H) 04/03/2015 0836   CREATININE 1.47 (H) 08/09/2017 0845   CREATININE 1.36 (H) 04/03/2015 0836   CALCIUM 9.4 08/09/2017 0845   CALCIUM 9.7 04/03/2015 0836   GFRNONAA 50 (L) 08/09/2017 0845   GFRNONAA 58 (L) 04/03/2015 0836   GFRAA 59 (L) 08/09/2017 0845   GFRAA >60 04/03/2015 0836   CrCl cannot be calculated (Patient's most recent lab result is  older than the maximum 21 days allowed.).  COAG Lab Results  Component Value Date   INR 0.9 06/20/2012   INR 2.2 03/25/2012   INR 1.4 02/26/2012    Radiology No results found.   Assessment/Plan 1. Chronic venous insufficiency No surgery or intervention at this point in time.  I have reviewed my discussion with the patient regarding venous insufficiency and why it causes symptoms. I have discussed with the patient the chronic skin changes that accompany venous insufficiency and the long term sequela such as ulceration. Patient will contnue wearing graduated compression stockings on a daily basis, as this has provided excellent control of his edema. The patient will put the stockings on first thing in the morning and removing them in the evening. The patient  is reminded not to sleep in the stockings.  In addition, behavioral modification including elevation during the day will be initiated. Exercise is strongly encouraged.  Given the patient's good control and lack of any problems regarding the venous insufficiency and lymphedema a lymph pump in not need at this time.  The patient will follow up with me PRN should anything change.  The patient voices agreement with this plan.   2. Lymphedema See #1  3. Mixed hyperlipidemia Continue statin as ordered and reviewed, no changes at this time   4. 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  Hgb A1C to be monitored as already arranged by primary service     Hortencia Pilar, MD  09/06/2017 8:11 AM

## 2017-09-07 ENCOUNTER — Ambulatory Visit
Admission: RE | Admit: 2017-09-07 | Discharge: 2017-09-07 | Disposition: A | Discharge status: Home / Self Care | Payer: BC Managed Care – PPO

## 2017-09-07 ENCOUNTER — Ambulatory Visit
Admission: RE | Admit: 2017-09-07 | Discharge: 2017-09-07 | Disposition: A | Discharge status: Home / Self Care | Payer: BC Managed Care – PPO | Attending: Nephrology | Admitting: Nephrology

## 2017-09-07 DIAGNOSIS — Z940 Kidney transplant status: Principal | ICD-10-CM

## 2017-09-07 DIAGNOSIS — Z79899 Other long term (current) drug therapy: Secondary | ICD-10-CM

## 2017-09-07 DIAGNOSIS — D899 Disorder involving the immune mechanism, unspecified: Secondary | ICD-10-CM

## 2017-09-07 DIAGNOSIS — N281 Cyst of kidney, acquired: Secondary | ICD-10-CM | POA: Diagnosis not present

## 2017-09-07 DIAGNOSIS — R600 Localized edema: Secondary | ICD-10-CM | POA: Diagnosis not present

## 2017-09-07 DIAGNOSIS — E1021 Type 1 diabetes mellitus with diabetic nephropathy: Secondary | ICD-10-CM | POA: Diagnosis not present

## 2017-09-07 DIAGNOSIS — Z6821 Body mass index (BMI) 21.0-21.9, adult: Secondary | ICD-10-CM | POA: Diagnosis not present

## 2017-09-07 DIAGNOSIS — R05 Cough: Secondary | ICD-10-CM | POA: Diagnosis not present

## 2017-09-07 DIAGNOSIS — Z794 Long term (current) use of insulin: Secondary | ICD-10-CM | POA: Diagnosis not present

## 2017-09-07 DIAGNOSIS — Z86718 Personal history of other venous thrombosis and embolism: Secondary | ICD-10-CM | POA: Diagnosis not present

## 2017-09-07 DIAGNOSIS — Z7982 Long term (current) use of aspirin: Secondary | ICD-10-CM | POA: Diagnosis not present

## 2017-09-07 DIAGNOSIS — C4490 Unspecified malignant neoplasm of skin, unspecified: Secondary | ICD-10-CM | POA: Diagnosis not present

## 2017-09-07 DIAGNOSIS — I10 Essential (primary) hypertension: Secondary | ICD-10-CM | POA: Diagnosis not present

## 2017-09-07 DIAGNOSIS — R918 Other nonspecific abnormal finding of lung field: Secondary | ICD-10-CM | POA: Diagnosis not present

## 2017-09-07 DIAGNOSIS — N271 Small kidney, bilateral: Secondary | ICD-10-CM | POA: Diagnosis not present

## 2017-09-07 DIAGNOSIS — R55 Syncope and collapse: Secondary | ICD-10-CM | POA: Diagnosis not present

## 2017-09-07 DIAGNOSIS — E785 Hyperlipidemia, unspecified: Secondary | ICD-10-CM | POA: Diagnosis not present

## 2017-09-07 DIAGNOSIS — D649 Anemia, unspecified: Secondary | ICD-10-CM | POA: Diagnosis not present

## 2017-09-07 MED ORDER — FUROSEMIDE 20 MG TABLET
ORAL_TABLET | ORAL | 3 refills | 0.00000 days
Start: 2017-09-07 — End: 2018-09-07

## 2017-09-09 MED ORDER — LISINOPRIL 40 MG TABLET
ORAL_TABLET | 3 refills | 0 days | Status: CP
Start: 2017-09-09 — End: 2018-09-05

## 2017-09-22 DIAGNOSIS — E1021 Type 1 diabetes mellitus with diabetic nephropathy: Secondary | ICD-10-CM | POA: Diagnosis not present

## 2017-09-22 DIAGNOSIS — E10649 Type 1 diabetes mellitus with hypoglycemia without coma: Secondary | ICD-10-CM | POA: Diagnosis not present

## 2017-09-22 DIAGNOSIS — E10641 Type 1 diabetes mellitus with hypoglycemia with coma: Secondary | ICD-10-CM | POA: Diagnosis not present

## 2017-09-30 MED ORDER — CHOLECALCIFEROL (VITAMIN D3) 50 MCG (2,000 UNIT) CAPSULE
ORAL_CAPSULE | Freq: Every day | ORAL | 11 refills | 0.00000 days | Status: CP
Start: 2017-09-30 — End: 2017-10-07

## 2017-10-07 MED ORDER — CHOLECALCIFEROL (VITAMIN D3) 50 MCG (2,000 UNIT) CAPSULE
ORAL_CAPSULE | Freq: Every day | ORAL | 11 refills | 0.00000 days | Status: CP
Start: 2017-10-07 — End: 2017-11-08

## 2017-10-15 ENCOUNTER — Other Ambulatory Visit
Admission: RE | Admit: 2017-10-15 | Discharge: 2017-10-15 | Disposition: A | Discharge status: Home / Self Care | Payer: BLUE CROSS/BLUE SHIELD | Source: Ambulatory Visit | Attending: Nephrology | Admitting: Nephrology

## 2017-10-15 ENCOUNTER — Ambulatory Visit
Admission: RE | Admit: 2017-10-15 | Discharge: 2017-10-15 | Disposition: A | Discharge status: Home / Self Care | Payer: BC Managed Care – PPO | Attending: Nephrology | Admitting: Nephrology

## 2017-10-15 DIAGNOSIS — T8610 Unspecified complication of kidney transplant: Secondary | ICD-10-CM | POA: Insufficient documentation

## 2017-10-15 DIAGNOSIS — B259 Cytomegaloviral disease, unspecified: Secondary | ICD-10-CM | POA: Diagnosis not present

## 2017-10-15 DIAGNOSIS — Z9483 Pancreas transplant status: Secondary | ICD-10-CM | POA: Insufficient documentation

## 2017-10-15 DIAGNOSIS — Z79899 Other long term (current) drug therapy: Secondary | ICD-10-CM | POA: Insufficient documentation

## 2017-10-15 DIAGNOSIS — D631 Anemia in chronic kidney disease: Secondary | ICD-10-CM | POA: Diagnosis not present

## 2017-10-15 DIAGNOSIS — Z789 Other specified health status: Secondary | ICD-10-CM | POA: Insufficient documentation

## 2017-10-15 DIAGNOSIS — Z09 Encounter for follow-up examination after completed treatment for conditions other than malignant neoplasm: Secondary | ICD-10-CM | POA: Insufficient documentation

## 2017-10-15 DIAGNOSIS — D899 Disorder involving the immune mechanism, unspecified: Secondary | ICD-10-CM | POA: Insufficient documentation

## 2017-10-15 DIAGNOSIS — N390 Urinary tract infection, site not specified: Secondary | ICD-10-CM | POA: Insufficient documentation

## 2017-10-15 DIAGNOSIS — E559 Vitamin D deficiency, unspecified: Secondary | ICD-10-CM | POA: Insufficient documentation

## 2017-10-15 DIAGNOSIS — Z114 Encounter for screening for human immunodeficiency virus [HIV]: Secondary | ICD-10-CM | POA: Insufficient documentation

## 2017-10-15 DIAGNOSIS — X58XXXA Exposure to other specified factors, initial encounter: Secondary | ICD-10-CM | POA: Diagnosis not present

## 2017-10-15 DIAGNOSIS — E1129 Type 2 diabetes mellitus with other diabetic kidney complication: Secondary | ICD-10-CM | POA: Insufficient documentation

## 2017-10-15 DIAGNOSIS — Z940 Kidney transplant status: Secondary | ICD-10-CM | POA: Insufficient documentation

## 2017-10-15 LAB — CBC WITH DIFFERENTIAL/PLATELET
Basophils Absolute: 0 10*3/uL (ref 0–0.1)
Basophils Relative: 1 %
Eosinophils Absolute: 0.7 10*3/uL (ref 0–0.7)
Eosinophils Relative: 18 %
HEMATOCRIT: 31.8 % — AB (ref 40.0–52.0)
Hemoglobin: 10.5 g/dL — ABNORMAL LOW (ref 13.0–18.0)
LYMPHS PCT: 24 %
Lymphs Abs: 1 10*3/uL (ref 1.0–3.6)
MCH: 30.8 pg (ref 26.0–34.0)
MCHC: 32.9 g/dL (ref 32.0–36.0)
MCV: 93.8 fL (ref 80.0–100.0)
MONOS PCT: 10 %
Monocytes Absolute: 0.4 10*3/uL (ref 0.2–1.0)
NEUTROS ABS: 1.8 10*3/uL (ref 1.4–6.5)
Neutrophils Relative %: 47 %
Platelets: 176 10*3/uL (ref 150–440)
RBC: 3.4 MIL/uL — ABNORMAL LOW (ref 4.40–5.90)
RDW: 13.5 % (ref 11.5–14.5)
WBC: 3.9 10*3/uL (ref 3.8–10.6)

## 2017-10-15 LAB — BASIC METABOLIC PANEL
Anion gap: 4 — ABNORMAL LOW (ref 5–15)
BUN: 36 mg/dL — AB (ref 6–20)
CHLORIDE: 111 mmol/L (ref 101–111)
CO2: 25 mmol/L (ref 22–32)
CREATININE: 1.22 mg/dL (ref 0.61–1.24)
Calcium: 9.4 mg/dL (ref 8.9–10.3)
GFR calc Af Amer: >60 mL/min (ref >60)
GFR calc non Af Amer: >60 mL/min (ref >60)
Glucose, Bld: 100 mg/dL — ABNORMAL HIGH (ref 65–99)
Potassium: 4.8 mmol/L (ref 3.5–5.1)
SODIUM: 140 mmol/L (ref 135–145)

## 2017-10-15 LAB — MAGNESIUM: Magnesium: 1.8 mg/dL (ref 1.7–2.4)

## 2017-10-15 LAB — PHOSPHORUS: PHOSPHORUS: 3.1 mg/dL (ref 2.5–4.6)

## 2017-10-18 DIAGNOSIS — Z940 Kidney transplant status: Principal | ICD-10-CM

## 2017-10-25 DIAGNOSIS — H401134 Primary open-angle glaucoma, bilateral, indeterminate stage: Secondary | ICD-10-CM | POA: Diagnosis not present

## 2017-10-25 LAB — HM DIABETES EYE EXAM

## 2017-11-08 MED ORDER — OMEPRAZOLE 20 MG CAPSULE,DELAYED RELEASE
ORAL_CAPSULE | Freq: Every day | ORAL | 3 refills | 0 days | Status: CP | PRN
Start: 2017-11-08 — End: 2018-09-29

## 2017-11-08 MED ORDER — CHOLECALCIFEROL (VITAMIN D3) 50 MCG (2,000 UNIT) CAPSULE
ORAL_CAPSULE | Freq: Every day | ORAL | 3 refills | 0 days | Status: CP
Start: 2017-11-08 — End: 2018-10-30

## 2017-11-11 ENCOUNTER — Other Ambulatory Visit: Payer: Self-pay | Admitting: Family Medicine

## 2017-11-15 ENCOUNTER — Telehealth: Payer: Self-pay | Admitting: Family Medicine

## 2017-11-15 NOTE — Telephone Encounter (Signed)
Pt states he had the 1st Shingle shot at [facility] on 09/07/17.  Pt is asking when can he get the 2nd shingle shot?  CB#[phone]/MW

## 2017-11-16 NOTE — Telephone Encounter (Signed)
Patient advised 2 months after first administration. Patient got it at [facility], patient was advised he would need to go to where he had it the first time or if we can get an official record of his first injection we can give him second one. Patient said he would get in touch with that clinic and get this record faxed to us.-Anastasiya Estell Harpin, RMA

## 2017-11-24 DIAGNOSIS — H401134 Primary open-angle glaucoma, bilateral, indeterminate stage: Secondary | ICD-10-CM | POA: Diagnosis not present

## 2017-11-25 ENCOUNTER — Ambulatory Visit (INDEPENDENT_AMBULATORY_CARE_PROVIDER_SITE_OTHER): Payer: BLUE CROSS/BLUE SHIELD | Admitting: Physician Assistant

## 2017-11-25 DIAGNOSIS — Z23 Encounter for immunization: Secondary | ICD-10-CM | POA: Diagnosis not present

## 2017-11-25 NOTE — Progress Notes (Signed)
2nd Shingrix updated today.

## 2017-11-26 DIAGNOSIS — E10649 Type 1 diabetes mellitus with hypoglycemia without coma: Secondary | ICD-10-CM | POA: Diagnosis not present

## 2017-11-26 DIAGNOSIS — E785 Hyperlipidemia, unspecified: Secondary | ICD-10-CM | POA: Diagnosis not present

## 2017-11-26 DIAGNOSIS — E1021 Type 1 diabetes mellitus with diabetic nephropathy: Secondary | ICD-10-CM | POA: Diagnosis not present

## 2017-11-26 DIAGNOSIS — Z940 Kidney transplant status: Secondary | ICD-10-CM | POA: Diagnosis not present

## 2017-11-29 DIAGNOSIS — E1021 Type 1 diabetes mellitus with diabetic nephropathy: Secondary | ICD-10-CM | POA: Diagnosis not present

## 2017-11-29 DIAGNOSIS — E10649 Type 1 diabetes mellitus with hypoglycemia without coma: Secondary | ICD-10-CM | POA: Diagnosis not present

## 2017-11-29 DIAGNOSIS — E10641 Type 1 diabetes mellitus with hypoglycemia with coma: Secondary | ICD-10-CM | POA: Diagnosis not present

## 2017-12-02 ENCOUNTER — Other Ambulatory Visit
Admission: RE | Admit: 2017-12-02 | Discharge: 2017-12-02 | Disposition: A | Discharge status: Home / Self Care | Payer: BLUE CROSS/BLUE SHIELD | Source: Ambulatory Visit | Attending: Nephrology | Admitting: Nephrology

## 2017-12-02 ENCOUNTER — Ambulatory Visit
Admission: RE | Admit: 2017-12-02 | Discharge: 2017-12-02 | Disposition: A | Discharge status: Home / Self Care | Payer: BC Managed Care – PPO | Attending: Nephrology | Admitting: Nephrology

## 2017-12-02 DIAGNOSIS — E559 Vitamin D deficiency, unspecified: Secondary | ICD-10-CM | POA: Insufficient documentation

## 2017-12-02 DIAGNOSIS — Z9483 Pancreas transplant status: Secondary | ICD-10-CM | POA: Insufficient documentation

## 2017-12-02 DIAGNOSIS — B259 Cytomegaloviral disease, unspecified: Secondary | ICD-10-CM | POA: Insufficient documentation

## 2017-12-02 DIAGNOSIS — N390 Urinary tract infection, site not specified: Secondary | ICD-10-CM | POA: Insufficient documentation

## 2017-12-02 DIAGNOSIS — N189 Chronic kidney disease, unspecified: Secondary | ICD-10-CM | POA: Diagnosis not present

## 2017-12-02 DIAGNOSIS — E1122 Type 2 diabetes mellitus with diabetic chronic kidney disease: Secondary | ICD-10-CM | POA: Insufficient documentation

## 2017-12-02 DIAGNOSIS — D631 Anemia in chronic kidney disease: Secondary | ICD-10-CM | POA: Diagnosis not present

## 2017-12-02 DIAGNOSIS — E1129 Type 2 diabetes mellitus with other diabetic kidney complication: Secondary | ICD-10-CM | POA: Insufficient documentation

## 2017-12-02 DIAGNOSIS — Z114 Encounter for screening for human immunodeficiency virus [HIV]: Secondary | ICD-10-CM | POA: Insufficient documentation

## 2017-12-02 DIAGNOSIS — D899 Disorder involving the immune mechanism, unspecified: Secondary | ICD-10-CM | POA: Insufficient documentation

## 2017-12-02 DIAGNOSIS — Z789 Other specified health status: Secondary | ICD-10-CM | POA: Diagnosis not present

## 2017-12-02 DIAGNOSIS — Z940 Kidney transplant status: Secondary | ICD-10-CM | POA: Diagnosis not present

## 2017-12-02 DIAGNOSIS — Z79899 Other long term (current) drug therapy: Secondary | ICD-10-CM | POA: Diagnosis not present

## 2017-12-02 LAB — CBC WITH DIFFERENTIAL/PLATELET
Basophils Absolute: 0.1 10*3/uL (ref 0–0.1)
Basophils Relative: 2 %
EOS PCT: 18 %
Eosinophils Absolute: 0.6 10*3/uL (ref 0–0.7)
HEMATOCRIT: 31.4 % — AB (ref 40.0–52.0)
Hemoglobin: 10.2 g/dL — ABNORMAL LOW (ref 13.0–18.0)
LYMPHS ABS: 0.9 10*3/uL — AB (ref 1.0–3.6)
LYMPHS PCT: 24 %
MCH: 30.4 pg (ref 26.0–34.0)
MCHC: 32.4 g/dL (ref 32.0–36.0)
MCV: 93.7 fL (ref 80.0–100.0)
MONO ABS: 0.4 10*3/uL (ref 0.2–1.0)
Monocytes Relative: 10 %
NEUTROS ABS: 1.7 10*3/uL (ref 1.4–6.5)
Neutrophils Relative %: 46 %
PLATELETS: 177 10*3/uL (ref 150–440)
RBC: 3.36 MIL/uL — AB (ref 4.40–5.90)
RDW: 13.3 % (ref 11.5–14.5)
WBC: 3.7 10*3/uL — ABNORMAL LOW (ref 3.8–10.6)

## 2017-12-02 LAB — BASIC METABOLIC PANEL
ANION GAP: 4 — AB (ref 5–15)
BUN: 31 mg/dL — AB (ref 6–20)
CO2: 27 mmol/L (ref 22–32)
Calcium: 9.5 mg/dL (ref 8.9–10.3)
Chloride: 107 mmol/L (ref 101–111)
Creatinine, Ser: 1.13 mg/dL (ref 0.61–1.24)
GFR calc Af Amer: >60 mL/min (ref >60)
GLUCOSE: 152 mg/dL — AB (ref 65–99)
POTASSIUM: 4.9 mmol/L (ref 3.5–5.1)
Sodium: 138 mmol/L (ref 135–145)

## 2017-12-02 LAB — MAGNESIUM: Magnesium: 1.7 mg/dL (ref 1.7–2.4)

## 2017-12-02 LAB — PHOSPHORUS: Phosphorus: 3 mg/dL (ref 2.5–4.6)

## 2017-12-03 DIAGNOSIS — Z940 Kidney transplant status: Principal | ICD-10-CM

## 2017-12-27 DIAGNOSIS — H401134 Primary open-angle glaucoma, bilateral, indeterminate stage: Secondary | ICD-10-CM | POA: Diagnosis not present

## 2018-01-21 DIAGNOSIS — E10649 Type 1 diabetes mellitus with hypoglycemia without coma: Secondary | ICD-10-CM | POA: Diagnosis not present

## 2018-01-21 DIAGNOSIS — E10641 Type 1 diabetes mellitus with hypoglycemia with coma: Secondary | ICD-10-CM | POA: Diagnosis not present

## 2018-01-21 DIAGNOSIS — E1021 Type 1 diabetes mellitus with diabetic nephropathy: Secondary | ICD-10-CM | POA: Diagnosis not present

## 2018-01-28 ENCOUNTER — Other Ambulatory Visit
Admission: RE | Admit: 2018-01-28 | Discharge: 2018-01-28 | Disposition: A | Discharge status: Home / Self Care | Payer: BLUE CROSS/BLUE SHIELD | Source: Ambulatory Visit | Attending: Nephrology | Admitting: Nephrology

## 2018-01-28 ENCOUNTER — Encounter
Admit: 2018-01-28 | Discharge: 2018-01-28 | Payer: PRIVATE HEALTH INSURANCE | Attending: Nephrology | Primary: Nephrology

## 2018-01-28 DIAGNOSIS — E1129 Type 2 diabetes mellitus with other diabetic kidney complication: Secondary | ICD-10-CM | POA: Diagnosis not present

## 2018-01-28 DIAGNOSIS — N189 Chronic kidney disease, unspecified: Secondary | ICD-10-CM | POA: Insufficient documentation

## 2018-01-28 DIAGNOSIS — Z940 Kidney transplant status: Secondary | ICD-10-CM | POA: Diagnosis not present

## 2018-01-28 DIAGNOSIS — Z9483 Pancreas transplant status: Secondary | ICD-10-CM | POA: Insufficient documentation

## 2018-01-28 DIAGNOSIS — D899 Disorder involving the immune mechanism, unspecified: Secondary | ICD-10-CM | POA: Diagnosis not present

## 2018-01-28 DIAGNOSIS — Z114 Encounter for screening for human immunodeficiency virus [HIV]: Secondary | ICD-10-CM | POA: Insufficient documentation

## 2018-01-28 DIAGNOSIS — Z789 Other specified health status: Secondary | ICD-10-CM | POA: Diagnosis not present

## 2018-01-28 DIAGNOSIS — Z79899 Other long term (current) drug therapy: Secondary | ICD-10-CM | POA: Diagnosis not present

## 2018-01-28 DIAGNOSIS — E559 Vitamin D deficiency, unspecified: Secondary | ICD-10-CM | POA: Diagnosis not present

## 2018-01-28 DIAGNOSIS — B259 Cytomegaloviral disease, unspecified: Secondary | ICD-10-CM | POA: Insufficient documentation

## 2018-01-28 DIAGNOSIS — X58XXXA Exposure to other specified factors, initial encounter: Secondary | ICD-10-CM | POA: Diagnosis not present

## 2018-01-28 DIAGNOSIS — D631 Anemia in chronic kidney disease: Secondary | ICD-10-CM | POA: Diagnosis not present

## 2018-01-28 DIAGNOSIS — Z09 Encounter for follow-up examination after completed treatment for conditions other than malignant neoplasm: Secondary | ICD-10-CM | POA: Insufficient documentation

## 2018-01-28 DIAGNOSIS — N390 Urinary tract infection, site not specified: Secondary | ICD-10-CM | POA: Diagnosis not present

## 2018-01-28 LAB — CBC WITH DIFFERENTIAL/PLATELET
BASOS ABS: 0.1 10*3/uL (ref 0–0.1)
Basophils Relative: 2 %
Eosinophils Absolute: 0.7 10*3/uL (ref 0–0.7)
Eosinophils Relative: 14 %
HEMATOCRIT: 33.2 % — AB (ref 40.0–52.0)
Hemoglobin: 10.9 g/dL — ABNORMAL LOW (ref 13.0–18.0)
LYMPHS PCT: 19 %
Lymphs Abs: 0.9 10*3/uL — ABNORMAL LOW (ref 1.0–3.6)
MCH: 30.8 pg (ref 26.0–34.0)
MCHC: 32.7 g/dL (ref 32.0–36.0)
MCV: 94 fL (ref 80.0–100.0)
Monocytes Absolute: 0.4 10*3/uL (ref 0.2–1.0)
Monocytes Relative: 9 %
NEUTROS ABS: 2.6 10*3/uL (ref 1.4–6.5)
Neutrophils Relative %: 56 %
Platelets: 224 10*3/uL (ref 150–440)
RBC: 3.53 MIL/uL — AB (ref 4.40–5.90)
RDW: 13.4 % (ref 11.5–14.5)
WBC: 4.6 10*3/uL (ref 3.8–10.6)

## 2018-01-28 LAB — BASIC METABOLIC PANEL
ANION GAP: 6 (ref 5–15)
BUN: 35 mg/dL — ABNORMAL HIGH (ref 6–20)
CO2: 25 mmol/L (ref 22–32)
Calcium: 9.4 mg/dL (ref 8.9–10.3)
Chloride: 108 mmol/L (ref 101–111)
Creatinine, Ser: 1.27 mg/dL — ABNORMAL HIGH (ref 0.61–1.24)
GFR calc non Af Amer: >60 mL/min (ref >60)
GLUCOSE: 119 mg/dL — AB (ref 65–99)
POTASSIUM: 5 mmol/L (ref 3.5–5.1)
Sodium: 139 mmol/L (ref 135–145)

## 2018-01-28 LAB — PHOSPHORUS: PHOSPHORUS: 2.9 mg/dL (ref 2.5–4.6)

## 2018-01-28 LAB — MAGNESIUM: Magnesium: 1.6 mg/dL — ABNORMAL LOW (ref 1.7–2.4)

## 2018-02-01 DIAGNOSIS — Z940 Kidney transplant status: Principal | ICD-10-CM

## 2018-02-08 MED ORDER — ATORVASTATIN 20 MG TABLET
ORAL_TABLET | 3 refills | 0 days | Status: CP
Start: 2018-02-08 — End: 2019-02-03

## 2018-02-21 MED ORDER — PROGRAF 1 MG CAPSULE
ORAL_CAPSULE | 3 refills | 0 days | Status: CP
Start: 2018-02-21 — End: 2019-01-18

## 2018-02-28 DIAGNOSIS — E1021 Type 1 diabetes mellitus with diabetic nephropathy: Secondary | ICD-10-CM | POA: Diagnosis not present

## 2018-02-28 DIAGNOSIS — E10649 Type 1 diabetes mellitus with hypoglycemia without coma: Secondary | ICD-10-CM | POA: Diagnosis not present

## 2018-02-28 DIAGNOSIS — E10641 Type 1 diabetes mellitus with hypoglycemia with coma: Secondary | ICD-10-CM | POA: Diagnosis not present

## 2018-03-02 ENCOUNTER — Encounter: Admit: 2018-03-02 | Discharge: 2018-03-02 | Payer: PRIVATE HEALTH INSURANCE

## 2018-03-02 ENCOUNTER — Encounter
Admit: 2018-03-02 | Discharge: 2018-03-02 | Payer: PRIVATE HEALTH INSURANCE | Attending: Nephrology | Primary: Nephrology

## 2018-03-02 DIAGNOSIS — N189 Chronic kidney disease, unspecified: Principal | ICD-10-CM

## 2018-03-02 DIAGNOSIS — D631 Anemia in chronic kidney disease: Secondary | ICD-10-CM

## 2018-03-02 DIAGNOSIS — Z79899 Other long term (current) drug therapy: Secondary | ICD-10-CM

## 2018-03-02 DIAGNOSIS — D899 Disorder involving the immune mechanism, unspecified: Secondary | ICD-10-CM

## 2018-03-02 DIAGNOSIS — Z940 Kidney transplant status: Secondary | ICD-10-CM

## 2018-03-02 DIAGNOSIS — Z1159 Encounter for screening for other viral diseases: Secondary | ICD-10-CM

## 2018-03-02 DIAGNOSIS — Z7982 Long term (current) use of aspirin: Secondary | ICD-10-CM | POA: Diagnosis not present

## 2018-03-02 DIAGNOSIS — Z86718 Personal history of other venous thrombosis and embolism: Secondary | ICD-10-CM | POA: Diagnosis not present

## 2018-03-02 DIAGNOSIS — Z794 Long term (current) use of insulin: Secondary | ICD-10-CM | POA: Diagnosis not present

## 2018-03-02 DIAGNOSIS — E1021 Type 1 diabetes mellitus with diabetic nephropathy: Secondary | ICD-10-CM | POA: Diagnosis not present

## 2018-03-02 DIAGNOSIS — R600 Localized edema: Secondary | ICD-10-CM | POA: Diagnosis not present

## 2018-03-02 DIAGNOSIS — Z85828 Personal history of other malignant neoplasm of skin: Secondary | ICD-10-CM | POA: Diagnosis not present

## 2018-03-02 DIAGNOSIS — E785 Hyperlipidemia, unspecified: Secondary | ICD-10-CM | POA: Diagnosis not present

## 2018-03-02 DIAGNOSIS — I10 Essential (primary) hypertension: Secondary | ICD-10-CM | POA: Diagnosis not present

## 2018-03-02 MED ORDER — AMLODIPINE 2.5 MG TABLET
ORAL_TABLET | Freq: Every day | ORAL | 11 refills | 0 days | Status: CP
Start: 2018-03-02 — End: 2018-09-21

## 2018-04-05 ENCOUNTER — Other Ambulatory Visit
Admission: RE | Admit: 2018-04-05 | Discharge: 2018-04-05 | Disposition: A | Discharge status: Home / Self Care | Payer: BLUE CROSS/BLUE SHIELD | Source: Ambulatory Visit | Attending: Nephrology | Admitting: Nephrology

## 2018-04-05 ENCOUNTER — Encounter
Admit: 2018-04-05 | Discharge: 2018-04-05 | Payer: PRIVATE HEALTH INSURANCE | Attending: Nephrology | Primary: Nephrology

## 2018-04-05 DIAGNOSIS — D899 Disorder involving the immune mechanism, unspecified: Secondary | ICD-10-CM | POA: Diagnosis not present

## 2018-04-05 DIAGNOSIS — X58XXXA Exposure to other specified factors, initial encounter: Secondary | ICD-10-CM | POA: Insufficient documentation

## 2018-04-05 DIAGNOSIS — T8610 Unspecified complication of kidney transplant: Secondary | ICD-10-CM | POA: Diagnosis not present

## 2018-04-05 DIAGNOSIS — D631 Anemia in chronic kidney disease: Secondary | ICD-10-CM | POA: Insufficient documentation

## 2018-04-05 DIAGNOSIS — E1129 Type 2 diabetes mellitus with other diabetic kidney complication: Secondary | ICD-10-CM | POA: Insufficient documentation

## 2018-04-05 DIAGNOSIS — Z9483 Pancreas transplant status: Secondary | ICD-10-CM | POA: Insufficient documentation

## 2018-04-05 DIAGNOSIS — Z789 Other specified health status: Secondary | ICD-10-CM | POA: Diagnosis not present

## 2018-04-05 DIAGNOSIS — Z09 Encounter for follow-up examination after completed treatment for conditions other than malignant neoplasm: Secondary | ICD-10-CM | POA: Insufficient documentation

## 2018-04-05 DIAGNOSIS — Z79899 Other long term (current) drug therapy: Secondary | ICD-10-CM | POA: Insufficient documentation

## 2018-04-05 DIAGNOSIS — Z114 Encounter for screening for human immunodeficiency virus [HIV]: Secondary | ICD-10-CM | POA: Insufficient documentation

## 2018-04-05 DIAGNOSIS — E559 Vitamin D deficiency, unspecified: Secondary | ICD-10-CM | POA: Diagnosis not present

## 2018-04-05 DIAGNOSIS — N390 Urinary tract infection, site not specified: Secondary | ICD-10-CM | POA: Insufficient documentation

## 2018-04-05 DIAGNOSIS — Z940 Kidney transplant status: Secondary | ICD-10-CM | POA: Insufficient documentation

## 2018-04-05 LAB — BASIC METABOLIC PANEL
Anion gap: 5 (ref 5–15)
BUN: 40 mg/dL — ABNORMAL HIGH (ref 6–20)
CO2: 27 mmol/L (ref 22–32)
Calcium: 9.4 mg/dL (ref 8.9–10.3)
Chloride: 109 mmol/L (ref 101–111)
Creatinine, Ser: 1.56 mg/dL — ABNORMAL HIGH (ref 0.61–1.24)
GFR calc Af Amer: 54 mL/min — ABNORMAL LOW (ref >60)
GFR calc non Af Amer: 47 mL/min — ABNORMAL LOW (ref >60)
GLUCOSE: 158 mg/dL — AB (ref 65–99)
POTASSIUM: 5.2 mmol/L — AB (ref 3.5–5.1)
Sodium: 141 mmol/L (ref 135–145)

## 2018-04-05 LAB — CBC WITH DIFFERENTIAL/PLATELET
Basophils Absolute: 0.1 10*3/uL (ref 0–0.1)
Basophils Relative: 2 %
Eosinophils Absolute: 0.8 10*3/uL — ABNORMAL HIGH (ref 0–0.7)
Eosinophils Relative: 14 %
HEMATOCRIT: 32.9 % — AB (ref 40.0–52.0)
Hemoglobin: 10.8 g/dL — ABNORMAL LOW (ref 13.0–18.0)
LYMPHS PCT: 19 %
Lymphs Abs: 1 10*3/uL (ref 1.0–3.6)
MCH: 31 pg (ref 26.0–34.0)
MCHC: 32.9 g/dL (ref 32.0–36.0)
MCV: 94.1 fL (ref 80.0–100.0)
MONO ABS: 0.4 10*3/uL (ref 0.2–1.0)
Monocytes Relative: 8 %
NEUTROS ABS: 3.1 10*3/uL (ref 1.4–6.5)
Neutrophils Relative %: 57 %
Platelets: 189 10*3/uL (ref 150–440)
RBC: 3.49 MIL/uL — AB (ref 4.40–5.90)
RDW: 13.4 % (ref 11.5–14.5)
WBC: 5.5 10*3/uL (ref 3.8–10.6)

## 2018-04-05 LAB — PHOSPHORUS: Phosphorus: 3.3 mg/dL (ref 2.5–4.6)

## 2018-04-05 LAB — MAGNESIUM: Magnesium: 1.6 mg/dL — ABNORMAL LOW (ref 1.7–2.4)

## 2018-04-08 DIAGNOSIS — Z940 Kidney transplant status: Principal | ICD-10-CM

## 2018-04-21 ENCOUNTER — Other Ambulatory Visit
Admission: RE | Admit: 2018-04-21 | Discharge: 2018-04-21 | Disposition: A | Discharge status: Home / Self Care | Payer: BLUE CROSS/BLUE SHIELD | Source: Ambulatory Visit | Attending: Nephrology | Admitting: Nephrology

## 2018-04-28 DIAGNOSIS — H401134 Primary open-angle glaucoma, bilateral, indeterminate stage: Secondary | ICD-10-CM | POA: Diagnosis not present

## 2018-04-29 DIAGNOSIS — E10641 Type 1 diabetes mellitus with hypoglycemia with coma: Secondary | ICD-10-CM | POA: Diagnosis not present

## 2018-04-29 DIAGNOSIS — E10649 Type 1 diabetes mellitus with hypoglycemia without coma: Secondary | ICD-10-CM | POA: Diagnosis not present

## 2018-04-29 DIAGNOSIS — E1021 Type 1 diabetes mellitus with diabetic nephropathy: Secondary | ICD-10-CM | POA: Diagnosis not present

## 2018-05-11 ENCOUNTER — Other Ambulatory Visit
Admission: RE | Admit: 2018-05-11 | Discharge: 2018-05-11 | Disposition: A | Discharge status: Home / Self Care | Payer: BLUE CROSS/BLUE SHIELD | Source: Ambulatory Visit | Attending: Nephrology | Admitting: Nephrology

## 2018-05-11 ENCOUNTER — Encounter
Admit: 2018-05-11 | Discharge: 2018-05-11 | Payer: PRIVATE HEALTH INSURANCE | Attending: Nephrology | Primary: Nephrology

## 2018-05-11 DIAGNOSIS — D899 Disorder involving the immune mechanism, unspecified: Secondary | ICD-10-CM | POA: Insufficient documentation

## 2018-05-11 DIAGNOSIS — Z9483 Pancreas transplant status: Secondary | ICD-10-CM | POA: Diagnosis not present

## 2018-05-11 DIAGNOSIS — N390 Urinary tract infection, site not specified: Secondary | ICD-10-CM | POA: Diagnosis not present

## 2018-05-11 DIAGNOSIS — Z940 Kidney transplant status: Secondary | ICD-10-CM | POA: Insufficient documentation

## 2018-05-11 DIAGNOSIS — Z789 Other specified health status: Secondary | ICD-10-CM | POA: Insufficient documentation

## 2018-05-11 DIAGNOSIS — T8610 Unspecified complication of kidney transplant: Secondary | ICD-10-CM | POA: Insufficient documentation

## 2018-05-11 DIAGNOSIS — E1129 Type 2 diabetes mellitus with other diabetic kidney complication: Secondary | ICD-10-CM | POA: Insufficient documentation

## 2018-05-11 DIAGNOSIS — D631 Anemia in chronic kidney disease: Secondary | ICD-10-CM | POA: Insufficient documentation

## 2018-05-11 DIAGNOSIS — Z79899 Other long term (current) drug therapy: Secondary | ICD-10-CM | POA: Diagnosis not present

## 2018-05-11 DIAGNOSIS — Z09 Encounter for follow-up examination after completed treatment for conditions other than malignant neoplasm: Secondary | ICD-10-CM | POA: Insufficient documentation

## 2018-05-11 DIAGNOSIS — B259 Cytomegaloviral disease, unspecified: Secondary | ICD-10-CM | POA: Insufficient documentation

## 2018-05-11 LAB — PHOSPHORUS: Phosphorus: 3 mg/dL (ref 2.5–4.6)

## 2018-05-11 LAB — MAGNESIUM: Magnesium: 1.5 mg/dL — ABNORMAL LOW (ref 1.7–2.4)

## 2018-05-11 LAB — BASIC METABOLIC PANEL
ANION GAP: 5 (ref 5–15)
BUN: 29 mg/dL — ABNORMAL HIGH (ref 6–20)
CHLORIDE: 108 mmol/L (ref 101–111)
CO2: 25 mmol/L (ref 22–32)
Calcium: 9 mg/dL (ref 8.9–10.3)
Creatinine, Ser: 1.26 mg/dL — ABNORMAL HIGH (ref 0.61–1.24)
GFR calc Af Amer: >60 mL/min (ref >60)
GFR calc non Af Amer: >60 mL/min (ref >60)
GLUCOSE: 139 mg/dL — AB (ref 65–99)
POTASSIUM: 5 mmol/L (ref 3.5–5.1)
Sodium: 138 mmol/L (ref 135–145)

## 2018-05-11 LAB — CBC WITH DIFFERENTIAL/PLATELET
Basophils Absolute: 0.1 10*3/uL (ref 0–0.1)
Basophils Relative: 2 %
Eosinophils Absolute: 0.8 10*3/uL — ABNORMAL HIGH (ref 0–0.7)
Eosinophils Relative: 18 %
HCT: 31 % — ABNORMAL LOW (ref 40.0–52.0)
HEMOGLOBIN: 10.2 g/dL — AB (ref 13.0–18.0)
LYMPHS ABS: 0.9 10*3/uL — AB (ref 1.0–3.6)
LYMPHS PCT: 19 %
MCH: 30.9 pg (ref 26.0–34.0)
MCHC: 32.8 g/dL (ref 32.0–36.0)
MCV: 94.1 fL (ref 80.0–100.0)
Monocytes Absolute: 0.4 10*3/uL (ref 0.2–1.0)
Monocytes Relative: 9 %
Neutro Abs: 2.4 10*3/uL (ref 1.4–6.5)
Neutrophils Relative %: 52 %
Platelets: 179 10*3/uL (ref 150–440)
RBC: 3.3 MIL/uL — AB (ref 4.40–5.90)
RDW: 13.8 % (ref 11.5–14.5)
WBC: 4.7 10*3/uL (ref 3.8–10.6)

## 2018-05-12 DIAGNOSIS — Z940 Kidney transplant status: Principal | ICD-10-CM

## 2018-05-31 DIAGNOSIS — E1021 Type 1 diabetes mellitus with diabetic nephropathy: Secondary | ICD-10-CM | POA: Diagnosis not present

## 2018-05-31 DIAGNOSIS — E10641 Type 1 diabetes mellitus with hypoglycemia with coma: Secondary | ICD-10-CM | POA: Diagnosis not present

## 2018-05-31 DIAGNOSIS — E10649 Type 1 diabetes mellitus with hypoglycemia without coma: Secondary | ICD-10-CM | POA: Diagnosis not present

## 2018-07-21 DIAGNOSIS — Z85828 Personal history of other malignant neoplasm of skin: Secondary | ICD-10-CM | POA: Diagnosis not present

## 2018-07-21 DIAGNOSIS — Z872 Personal history of diseases of the skin and subcutaneous tissue: Secondary | ICD-10-CM | POA: Diagnosis not present

## 2018-07-21 DIAGNOSIS — L570 Actinic keratosis: Secondary | ICD-10-CM | POA: Diagnosis not present

## 2018-07-21 DIAGNOSIS — L578 Other skin changes due to chronic exposure to nonionizing radiation: Secondary | ICD-10-CM | POA: Diagnosis not present

## 2018-07-21 DIAGNOSIS — Z1283 Encounter for screening for malignant neoplasm of skin: Secondary | ICD-10-CM | POA: Diagnosis not present

## 2018-07-21 MED ORDER — MYCOPHENOLATE SODIUM 360 MG TABLET,DELAYED RELEASE
ORAL_TABLET | 3 refills | 0 days | Status: CP
Start: 2018-07-21 — End: 2019-08-10

## 2018-08-01 DIAGNOSIS — E10649 Type 1 diabetes mellitus with hypoglycemia without coma: Secondary | ICD-10-CM | POA: Diagnosis not present

## 2018-08-01 DIAGNOSIS — E10641 Type 1 diabetes mellitus with hypoglycemia with coma: Secondary | ICD-10-CM | POA: Diagnosis not present

## 2018-08-01 DIAGNOSIS — E1021 Type 1 diabetes mellitus with diabetic nephropathy: Secondary | ICD-10-CM | POA: Diagnosis not present

## 2018-08-04 ENCOUNTER — Other Ambulatory Visit
Admission: RE | Admit: 2018-08-04 | Discharge: 2018-08-04 | Disposition: A | Discharge status: Home / Self Care | Payer: BLUE CROSS/BLUE SHIELD | Source: Ambulatory Visit | Attending: Nephrology | Admitting: Nephrology

## 2018-08-04 ENCOUNTER — Encounter
Admit: 2018-08-04 | Discharge: 2018-08-04 | Payer: PRIVATE HEALTH INSURANCE | Attending: Nephrology | Primary: Nephrology

## 2018-08-04 DIAGNOSIS — Z09 Encounter for follow-up examination after completed treatment for conditions other than malignant neoplasm: Secondary | ICD-10-CM | POA: Diagnosis not present

## 2018-08-04 DIAGNOSIS — Z789 Other specified health status: Secondary | ICD-10-CM | POA: Insufficient documentation

## 2018-08-04 DIAGNOSIS — E559 Vitamin D deficiency, unspecified: Secondary | ICD-10-CM | POA: Diagnosis not present

## 2018-08-04 DIAGNOSIS — D631 Anemia in chronic kidney disease: Secondary | ICD-10-CM | POA: Diagnosis not present

## 2018-08-04 DIAGNOSIS — Z9483 Pancreas transplant status: Secondary | ICD-10-CM | POA: Insufficient documentation

## 2018-08-04 DIAGNOSIS — B259 Cytomegaloviral disease, unspecified: Secondary | ICD-10-CM | POA: Diagnosis not present

## 2018-08-04 DIAGNOSIS — T8610 Unspecified complication of kidney transplant: Secondary | ICD-10-CM | POA: Diagnosis not present

## 2018-08-04 DIAGNOSIS — Z79899 Other long term (current) drug therapy: Secondary | ICD-10-CM | POA: Diagnosis not present

## 2018-08-04 DIAGNOSIS — E1129 Type 2 diabetes mellitus with other diabetic kidney complication: Secondary | ICD-10-CM | POA: Insufficient documentation

## 2018-08-04 DIAGNOSIS — Z114 Encounter for screening for human immunodeficiency virus [HIV]: Secondary | ICD-10-CM | POA: Diagnosis not present

## 2018-08-04 DIAGNOSIS — N390 Urinary tract infection, site not specified: Secondary | ICD-10-CM | POA: Insufficient documentation

## 2018-08-04 DIAGNOSIS — Z940 Kidney transplant status: Secondary | ICD-10-CM | POA: Insufficient documentation

## 2018-08-04 DIAGNOSIS — D899 Disorder involving the immune mechanism, unspecified: Secondary | ICD-10-CM | POA: Insufficient documentation

## 2018-08-04 LAB — CBC WITH DIFFERENTIAL/PLATELET
Basophils Absolute: 0.1 10*3/uL (ref 0–0.1)
Basophils Relative: 1 %
EOS PCT: 9 %
Eosinophils Absolute: 0.5 10*3/uL (ref 0–0.7)
HEMATOCRIT: 32.5 % — AB (ref 40.0–52.0)
Hemoglobin: 10.5 g/dL — ABNORMAL LOW (ref 13.0–18.0)
Lymphocytes Relative: 14 %
Lymphs Abs: 0.9 10*3/uL — ABNORMAL LOW (ref 1.0–3.6)
MCH: 31 pg (ref 26.0–34.0)
MCHC: 32.4 g/dL (ref 32.0–36.0)
MCV: 95.4 fL (ref 80.0–100.0)
MONO ABS: 0.5 10*3/uL (ref 0.2–1.0)
MONOS PCT: 8 %
NEUTROS ABS: 4.2 10*3/uL (ref 1.4–6.5)
Neutrophils Relative %: 68 %
Platelets: 208 10*3/uL (ref 150–440)
RBC: 3.41 MIL/uL — ABNORMAL LOW (ref 4.40–5.90)
RDW: 13.5 % (ref 11.5–14.5)
WBC: 6.1 10*3/uL (ref 3.8–10.6)

## 2018-08-04 LAB — BASIC METABOLIC PANEL
Anion gap: 7 (ref 5–15)
BUN: 41 mg/dL — AB (ref 6–20)
CALCIUM: 9.3 mg/dL (ref 8.9–10.3)
CO2: 22 mmol/L (ref 22–32)
CREATININE: 1.32 mg/dL — AB (ref 0.61–1.24)
Chloride: 106 mmol/L (ref 98–111)
GFR calc Af Amer: >60 mL/min (ref >60)
GFR, EST NON AFRICAN AMERICAN: 57 mL/min — AB (ref >60)
Glucose, Bld: 161 mg/dL — ABNORMAL HIGH (ref 70–99)
Potassium: 5.3 mmol/L — ABNORMAL HIGH (ref 3.5–5.1)
SODIUM: 135 mmol/L (ref 135–145)

## 2018-08-04 LAB — MAGNESIUM: Magnesium: 1.8 mg/dL (ref 1.7–2.4)

## 2018-08-04 LAB — PHOSPHORUS: Phosphorus: 3.2 mg/dL (ref 2.5–4.6)

## 2018-08-05 DIAGNOSIS — Z940 Kidney transplant status: Principal | ICD-10-CM

## 2018-08-18 MED ORDER — CINACALCET 60 MG TABLET
ORAL_TABLET | Freq: Every day | ORAL | PRN refills | 0 days | Status: CP
Start: 2018-08-18 — End: 2019-08-10

## 2018-08-26 DIAGNOSIS — E1021 Type 1 diabetes mellitus with diabetic nephropathy: Secondary | ICD-10-CM | POA: Diagnosis not present

## 2018-08-26 DIAGNOSIS — E785 Hyperlipidemia, unspecified: Secondary | ICD-10-CM | POA: Diagnosis not present

## 2018-08-26 DIAGNOSIS — E1159 Type 2 diabetes mellitus with other circulatory complications: Secondary | ICD-10-CM | POA: Diagnosis not present

## 2018-08-26 DIAGNOSIS — E1069 Type 1 diabetes mellitus with other specified complication: Secondary | ICD-10-CM | POA: Diagnosis not present

## 2018-08-26 DIAGNOSIS — E10649 Type 1 diabetes mellitus with hypoglycemia without coma: Secondary | ICD-10-CM | POA: Diagnosis not present

## 2018-08-31 DIAGNOSIS — E1021 Type 1 diabetes mellitus with diabetic nephropathy: Secondary | ICD-10-CM | POA: Diagnosis not present

## 2018-08-31 DIAGNOSIS — E10649 Type 1 diabetes mellitus with hypoglycemia without coma: Secondary | ICD-10-CM | POA: Diagnosis not present

## 2018-08-31 DIAGNOSIS — E10641 Type 1 diabetes mellitus with hypoglycemia with coma: Secondary | ICD-10-CM | POA: Diagnosis not present

## 2018-09-07 MED ORDER — LISINOPRIL 40 MG TABLET
ORAL_TABLET | 4 refills | 0 days | Status: CP
Start: 2018-09-07 — End: ?

## 2018-09-21 ENCOUNTER — Ambulatory Visit: Admit: 2018-09-21 | Discharge: 2018-09-21 | Payer: PRIVATE HEALTH INSURANCE

## 2018-09-21 ENCOUNTER — Ambulatory Visit
Admit: 2018-09-21 | Discharge: 2018-09-21 | Payer: PRIVATE HEALTH INSURANCE | Attending: Nephrology | Primary: Nephrology

## 2018-09-21 ENCOUNTER — Encounter: Admit: 2018-09-21 | Discharge: 2018-09-21 | Payer: PRIVATE HEALTH INSURANCE

## 2018-09-21 DIAGNOSIS — Z79899 Other long term (current) drug therapy: Secondary | ICD-10-CM

## 2018-09-21 DIAGNOSIS — Z1159 Encounter for screening for other viral diseases: Secondary | ICD-10-CM

## 2018-09-21 DIAGNOSIS — N183 Chronic kidney disease, stage 3 (moderate): Secondary | ICD-10-CM

## 2018-09-21 DIAGNOSIS — Z940 Kidney transplant status: Principal | ICD-10-CM

## 2018-09-21 DIAGNOSIS — D631 Anemia in chronic kidney disease: Secondary | ICD-10-CM

## 2018-09-21 DIAGNOSIS — I10 Essential (primary) hypertension: Secondary | ICD-10-CM

## 2018-09-21 DIAGNOSIS — N189 Chronic kidney disease, unspecified: Principal | ICD-10-CM

## 2018-09-21 DIAGNOSIS — R600 Localized edema: Secondary | ICD-10-CM

## 2018-09-21 DIAGNOSIS — D899 Disorder involving the immune mechanism, unspecified: Secondary | ICD-10-CM

## 2018-09-21 DIAGNOSIS — E785 Hyperlipidemia, unspecified: Secondary | ICD-10-CM

## 2018-09-21 DIAGNOSIS — R918 Other nonspecific abnormal finding of lung field: Secondary | ICD-10-CM | POA: Diagnosis not present

## 2018-09-21 DIAGNOSIS — N271 Small kidney, bilateral: Secondary | ICD-10-CM | POA: Diagnosis not present

## 2018-09-21 MED ORDER — AMLODIPINE 2.5 MG TABLET
ORAL_TABLET | Freq: Every day | ORAL | 11 refills | 0.00000 days | Status: CP
Start: 2018-09-21 — End: 2019-01-30

## 2018-10-25 DIAGNOSIS — H401134 Primary open-angle glaucoma, bilateral, indeterminate stage: Secondary | ICD-10-CM | POA: Diagnosis not present

## 2018-10-31 MED ORDER — VITAMIN D3 50 MCG (2,000 UNIT) CAPSULE
ORAL_CAPSULE | 4 refills | 0 days | Status: CP
Start: 2018-10-31 — End: ?

## 2018-10-31 MED ORDER — OMEPRAZOLE 20 MG CAPSULE,DELAYED RELEASE
ORAL_CAPSULE | 4 refills | 0 days | Status: CP
Start: 2018-10-31 — End: 2019-03-28

## 2018-11-01 DIAGNOSIS — E10649 Type 1 diabetes mellitus with hypoglycemia without coma: Secondary | ICD-10-CM | POA: Diagnosis not present

## 2018-11-01 DIAGNOSIS — E1021 Type 1 diabetes mellitus with diabetic nephropathy: Secondary | ICD-10-CM | POA: Diagnosis not present

## 2018-11-01 DIAGNOSIS — E10641 Type 1 diabetes mellitus with hypoglycemia with coma: Secondary | ICD-10-CM | POA: Diagnosis not present

## 2018-11-07 ENCOUNTER — Other Ambulatory Visit
Admission: RE | Admit: 2018-11-07 | Discharge: 2018-11-07 | Disposition: A | Discharge status: Home / Self Care | Payer: BLUE CROSS/BLUE SHIELD | Source: Ambulatory Visit | Attending: Nephrology | Admitting: Nephrology

## 2018-11-07 ENCOUNTER — Encounter
Admit: 2018-11-07 | Discharge: 2018-11-07 | Payer: PRIVATE HEALTH INSURANCE | Attending: Nephrology | Primary: Nephrology

## 2018-11-07 DIAGNOSIS — D899 Disorder involving the immune mechanism, unspecified: Secondary | ICD-10-CM | POA: Diagnosis not present

## 2018-11-07 DIAGNOSIS — Z79899 Other long term (current) drug therapy: Secondary | ICD-10-CM | POA: Insufficient documentation

## 2018-11-07 DIAGNOSIS — Z940 Kidney transplant status: Secondary | ICD-10-CM | POA: Insufficient documentation

## 2018-11-07 DIAGNOSIS — N189 Chronic kidney disease, unspecified: Secondary | ICD-10-CM | POA: Insufficient documentation

## 2018-11-07 DIAGNOSIS — E559 Vitamin D deficiency, unspecified: Secondary | ICD-10-CM | POA: Insufficient documentation

## 2018-11-07 DIAGNOSIS — D631 Anemia in chronic kidney disease: Secondary | ICD-10-CM | POA: Insufficient documentation

## 2018-11-07 DIAGNOSIS — E1129 Type 2 diabetes mellitus with other diabetic kidney complication: Secondary | ICD-10-CM | POA: Diagnosis not present

## 2018-11-07 LAB — BASIC METABOLIC PANEL
Anion gap: 8 (ref 5–15)
BUN: 30 mg/dL — AB (ref 6–20)
CALCIUM: 9.1 mg/dL (ref 8.9–10.3)
CO2: 27 mmol/L (ref 22–32)
CREATININE: 1.29 mg/dL — AB (ref 0.61–1.24)
Chloride: 107 mmol/L (ref 98–111)
GFR calc Af Amer: >60 mL/min (ref >60)
GFR, EST NON AFRICAN AMERICAN: 59 mL/min — AB (ref >60)
GLUCOSE: 124 mg/dL — AB (ref 70–99)
Potassium: 4.5 mmol/L (ref 3.5–5.1)
Sodium: 142 mmol/L (ref 135–145)

## 2018-11-07 LAB — CBC WITH DIFFERENTIAL/PLATELET
Abs Immature Granulocytes: 0.02 10*3/uL (ref 0.00–0.07)
BASOS ABS: 0.1 10*3/uL (ref 0.0–0.1)
Basophils Relative: 1 %
EOS PCT: 11 %
Eosinophils Absolute: 0.6 10*3/uL — ABNORMAL HIGH (ref 0.0–0.5)
HEMATOCRIT: 34.2 % — AB (ref 39.0–52.0)
HEMOGLOBIN: 10.7 g/dL — AB (ref 13.0–17.0)
Immature Granulocytes: 0 %
LYMPHS ABS: 1.1 10*3/uL (ref 0.7–4.0)
LYMPHS PCT: 19 %
MCH: 30.7 pg (ref 26.0–34.0)
MCHC: 31.3 g/dL (ref 30.0–36.0)
MCV: 98 fL (ref 80.0–100.0)
MONO ABS: 0.5 10*3/uL (ref 0.1–1.0)
MONOS PCT: 9 %
Neutro Abs: 3.3 10*3/uL (ref 1.7–7.7)
Neutrophils Relative %: 60 %
Platelets: 208 10*3/uL (ref 150–400)
RBC: 3.49 MIL/uL — ABNORMAL LOW (ref 4.22–5.81)
RDW: 13 % (ref 11.5–15.5)
WBC: 5.6 10*3/uL (ref 4.0–10.5)
nRBC: 0 % (ref 0.0–0.2)

## 2018-11-07 LAB — PHOSPHORUS: Phosphorus: 3 mg/dL (ref 2.5–4.6)

## 2018-11-07 LAB — MAGNESIUM: Magnesium: 1.5 mg/dL — ABNORMAL LOW (ref 1.7–2.4)

## 2018-11-09 DIAGNOSIS — Z940 Kidney transplant status: Principal | ICD-10-CM

## 2018-11-30 DIAGNOSIS — E10641 Type 1 diabetes mellitus with hypoglycemia with coma: Secondary | ICD-10-CM | POA: Diagnosis not present

## 2018-11-30 DIAGNOSIS — E1021 Type 1 diabetes mellitus with diabetic nephropathy: Secondary | ICD-10-CM | POA: Diagnosis not present

## 2018-11-30 DIAGNOSIS — E10649 Type 1 diabetes mellitus with hypoglycemia without coma: Secondary | ICD-10-CM | POA: Diagnosis not present

## 2018-12-13 ENCOUNTER — Other Ambulatory Visit
Admission: RE | Admit: 2018-12-13 | Discharge: 2018-12-13 | Disposition: A | Discharge status: Home / Self Care | Payer: BLUE CROSS/BLUE SHIELD | Attending: Nephrology | Admitting: Nephrology

## 2018-12-13 ENCOUNTER — Encounter
Admit: 2018-12-13 | Discharge: 2018-12-13 | Payer: PRIVATE HEALTH INSURANCE | Attending: Nephrology | Primary: Nephrology

## 2018-12-13 DIAGNOSIS — Z09 Encounter for follow-up examination after completed treatment for conditions other than malignant neoplasm: Secondary | ICD-10-CM | POA: Diagnosis not present

## 2018-12-13 DIAGNOSIS — D899 Disorder involving the immune mechanism, unspecified: Secondary | ICD-10-CM | POA: Insufficient documentation

## 2018-12-13 DIAGNOSIS — D631 Anemia in chronic kidney disease: Secondary | ICD-10-CM | POA: Diagnosis not present

## 2018-12-13 DIAGNOSIS — Z114 Encounter for screening for human immunodeficiency virus [HIV]: Secondary | ICD-10-CM | POA: Insufficient documentation

## 2018-12-13 DIAGNOSIS — N390 Urinary tract infection, site not specified: Secondary | ICD-10-CM | POA: Diagnosis not present

## 2018-12-13 DIAGNOSIS — N189 Chronic kidney disease, unspecified: Secondary | ICD-10-CM | POA: Diagnosis not present

## 2018-12-13 DIAGNOSIS — T8610 Unspecified complication of kidney transplant: Secondary | ICD-10-CM | POA: Insufficient documentation

## 2018-12-13 DIAGNOSIS — E559 Vitamin D deficiency, unspecified: Secondary | ICD-10-CM | POA: Insufficient documentation

## 2018-12-13 DIAGNOSIS — Z79899 Other long term (current) drug therapy: Secondary | ICD-10-CM | POA: Diagnosis not present

## 2018-12-13 DIAGNOSIS — B259 Cytomegaloviral disease, unspecified: Secondary | ICD-10-CM | POA: Insufficient documentation

## 2018-12-13 DIAGNOSIS — Z9483 Pancreas transplant status: Secondary | ICD-10-CM | POA: Diagnosis not present

## 2018-12-13 DIAGNOSIS — E1129 Type 2 diabetes mellitus with other diabetic kidney complication: Secondary | ICD-10-CM | POA: Diagnosis not present

## 2018-12-13 DIAGNOSIS — Z940 Kidney transplant status: Secondary | ICD-10-CM | POA: Insufficient documentation

## 2018-12-13 DIAGNOSIS — Z789 Other specified health status: Secondary | ICD-10-CM | POA: Diagnosis not present

## 2018-12-13 LAB — CBC WITH DIFFERENTIAL/PLATELET
ABS IMMATURE GRANULOCYTES: 0.02 10*3/uL (ref 0.00–0.07)
Basophils Absolute: 0.1 10*3/uL (ref 0.0–0.1)
Basophils Relative: 1 %
Eosinophils Absolute: 0.9 10*3/uL — ABNORMAL HIGH (ref 0.0–0.5)
Eosinophils Relative: 10 %
HCT: 35.8 % — ABNORMAL LOW (ref 39.0–52.0)
HEMOGLOBIN: 11 g/dL — AB (ref 13.0–17.0)
Immature Granulocytes: 0 %
LYMPHS PCT: 15 %
Lymphs Abs: 1.3 10*3/uL (ref 0.7–4.0)
MCH: 30.6 pg (ref 26.0–34.0)
MCHC: 30.7 g/dL (ref 30.0–36.0)
MCV: 99.7 fL (ref 80.0–100.0)
MONO ABS: 0.7 10*3/uL (ref 0.1–1.0)
MONOS PCT: 8 %
Neutro Abs: 5.7 10*3/uL (ref 1.7–7.7)
Neutrophils Relative %: 66 %
Platelets: 199 10*3/uL (ref 150–400)
RBC: 3.59 MIL/uL — AB (ref 4.22–5.81)
RDW: 12.6 % (ref 11.5–15.5)
WBC: 8.6 10*3/uL (ref 4.0–10.5)
nRBC: 0 % (ref 0.0–0.2)

## 2018-12-13 LAB — BASIC METABOLIC PANEL
Anion gap: 8 (ref 5–15)
BUN: 29 mg/dL — AB (ref 6–20)
CALCIUM: 9.3 mg/dL (ref 8.9–10.3)
CO2: 27 mmol/L (ref 22–32)
CREATININE: 1.37 mg/dL — AB (ref 0.61–1.24)
Chloride: 107 mmol/L (ref 98–111)
GFR calc Af Amer: >60 mL/min (ref >60)
GFR calc non Af Amer: 56 mL/min — ABNORMAL LOW (ref >60)
GLUCOSE: 171 mg/dL — AB (ref 70–99)
Potassium: 4.5 mmol/L (ref 3.5–5.1)
Sodium: 142 mmol/L (ref 135–145)

## 2018-12-13 LAB — MAGNESIUM: Magnesium: 1.8 mg/dL (ref 1.7–2.4)

## 2018-12-13 LAB — PHOSPHORUS: Phosphorus: 2.7 mg/dL (ref 2.5–4.6)

## 2018-12-15 DIAGNOSIS — Z940 Kidney transplant status: Principal | ICD-10-CM

## 2019-01-03 DIAGNOSIS — H401113 Primary open-angle glaucoma, right eye, severe stage: Secondary | ICD-10-CM | POA: Diagnosis not present

## 2019-01-03 LAB — HM DIABETES EYE EXAM

## 2019-01-18 MED ORDER — PROGRAF 1 MG CAPSULE
ORAL_CAPSULE | 4 refills | 0 days | Status: CP
Start: 2019-01-18 — End: 2019-02-15

## 2019-01-30 MED ORDER — AMLODIPINE 2.5 MG TABLET
ORAL_TABLET | Freq: Every day | ORAL | 11 refills | 0 days | Status: CP
Start: 2019-01-30 — End: 2020-01-30

## 2019-02-01 DIAGNOSIS — Z1283 Encounter for screening for malignant neoplasm of skin: Secondary | ICD-10-CM | POA: Diagnosis not present

## 2019-02-01 DIAGNOSIS — E10649 Type 1 diabetes mellitus with hypoglycemia without coma: Secondary | ICD-10-CM | POA: Diagnosis not present

## 2019-02-01 DIAGNOSIS — L578 Other skin changes due to chronic exposure to nonionizing radiation: Secondary | ICD-10-CM | POA: Diagnosis not present

## 2019-02-01 DIAGNOSIS — E1021 Type 1 diabetes mellitus with diabetic nephropathy: Secondary | ICD-10-CM | POA: Diagnosis not present

## 2019-02-01 DIAGNOSIS — E10641 Type 1 diabetes mellitus with hypoglycemia with coma: Secondary | ICD-10-CM | POA: Diagnosis not present

## 2019-02-01 DIAGNOSIS — L570 Actinic keratosis: Secondary | ICD-10-CM | POA: Diagnosis not present

## 2019-02-01 DIAGNOSIS — Z872 Personal history of diseases of the skin and subcutaneous tissue: Secondary | ICD-10-CM | POA: Diagnosis not present

## 2019-02-01 DIAGNOSIS — Z85828 Personal history of other malignant neoplasm of skin: Secondary | ICD-10-CM | POA: Diagnosis not present

## 2019-02-01 DIAGNOSIS — D485 Neoplasm of uncertain behavior of skin: Secondary | ICD-10-CM | POA: Diagnosis not present

## 2019-02-03 MED ORDER — ATORVASTATIN 20 MG TABLET
ORAL_TABLET | 4 refills | 0 days | Status: CP
Start: 2019-02-03 — End: ?

## 2019-02-08 ENCOUNTER — Other Ambulatory Visit
Admission: RE | Admit: 2019-02-08 | Discharge: 2019-02-08 | Disposition: A | Discharge status: Home / Self Care | Payer: BLUE CROSS/BLUE SHIELD | Attending: Nephrology | Admitting: Nephrology

## 2019-02-08 ENCOUNTER — Encounter: Admit: 2019-02-08 | Discharge: 2019-02-09 | Payer: PRIVATE HEALTH INSURANCE

## 2019-02-08 DIAGNOSIS — Z1159 Encounter for screening for other viral diseases: Secondary | ICD-10-CM

## 2019-02-08 DIAGNOSIS — Z79899 Other long term (current) drug therapy: Secondary | ICD-10-CM

## 2019-02-08 DIAGNOSIS — Z940 Kidney transplant status: Principal | ICD-10-CM

## 2019-02-08 DIAGNOSIS — N189 Chronic kidney disease, unspecified: Secondary | ICD-10-CM | POA: Diagnosis not present

## 2019-02-08 DIAGNOSIS — D631 Anemia in chronic kidney disease: Secondary | ICD-10-CM | POA: Insufficient documentation

## 2019-02-08 DIAGNOSIS — B259 Cytomegaloviral disease, unspecified: Secondary | ICD-10-CM | POA: Diagnosis not present

## 2019-02-08 DIAGNOSIS — E559 Vitamin D deficiency, unspecified: Secondary | ICD-10-CM | POA: Insufficient documentation

## 2019-02-08 DIAGNOSIS — D899 Disorder involving the immune mechanism, unspecified: Secondary | ICD-10-CM | POA: Insufficient documentation

## 2019-02-08 DIAGNOSIS — N390 Urinary tract infection, site not specified: Secondary | ICD-10-CM | POA: Insufficient documentation

## 2019-02-08 DIAGNOSIS — Z789 Other specified health status: Secondary | ICD-10-CM | POA: Diagnosis not present

## 2019-02-08 DIAGNOSIS — E1129 Type 2 diabetes mellitus with other diabetic kidney complication: Secondary | ICD-10-CM | POA: Diagnosis not present

## 2019-02-08 DIAGNOSIS — Z9483 Pancreas transplant status: Secondary | ICD-10-CM | POA: Insufficient documentation

## 2019-02-08 DIAGNOSIS — Z09 Encounter for follow-up examination after completed treatment for conditions other than malignant neoplasm: Secondary | ICD-10-CM | POA: Insufficient documentation

## 2019-02-08 LAB — BASIC METABOLIC PANEL
Anion gap: 8 (ref 5–15)
BUN: 41 mg/dL — AB (ref 6–20)
CALCIUM: 9.4 mg/dL (ref 8.9–10.3)
CHLORIDE: 107 mmol/L (ref 98–111)
CO2: 25 mmol/L (ref 22–32)
CREATININE: 1.29 mg/dL — AB (ref 0.61–1.24)
GFR calc non Af Amer: 60 mL/min — ABNORMAL LOW (ref >60)
Glucose, Bld: 200 mg/dL — ABNORMAL HIGH (ref 70–99)
Potassium: 4.6 mmol/L (ref 3.5–5.1)
SODIUM: 140 mmol/L (ref 135–145)

## 2019-02-08 LAB — CBC WITH DIFFERENTIAL/PLATELET
Abs Immature Granulocytes: 0.02 10*3/uL (ref 0.00–0.07)
Basophils Absolute: 0.1 10*3/uL (ref 0.0–0.1)
Basophils Relative: 1 %
EOS ABS: 0.8 10*3/uL — AB (ref 0.0–0.5)
Eosinophils Relative: 11 %
HEMATOCRIT: 32.5 % — AB (ref 39.0–52.0)
Hemoglobin: 10.1 g/dL — ABNORMAL LOW (ref 13.0–17.0)
IMMATURE GRANULOCYTES: 0 %
LYMPHS ABS: 1 10*3/uL (ref 0.7–4.0)
Lymphocytes Relative: 14 %
MCH: 31.2 pg (ref 26.0–34.0)
MCHC: 31.1 g/dL (ref 30.0–36.0)
MCV: 100.3 fL — AB (ref 80.0–100.0)
MONO ABS: 0.6 10*3/uL (ref 0.1–1.0)
MONOS PCT: 8 %
NEUTROS PCT: 66 %
Neutro Abs: 5 10*3/uL (ref 1.7–7.7)
Platelets: 195 10*3/uL (ref 150–400)
RBC: 3.24 MIL/uL — ABNORMAL LOW (ref 4.22–5.81)
RDW: 13 % (ref 11.5–15.5)
WBC: 7.6 10*3/uL (ref 4.0–10.5)
nRBC: 0 % (ref 0.0–0.2)

## 2019-02-08 LAB — PHOSPHORUS: PHOSPHORUS: 3.3 mg/dL (ref 2.5–4.6)

## 2019-02-08 LAB — MAGNESIUM: MAGNESIUM: 1.7 mg/dL (ref 1.7–2.4)

## 2019-02-15 DIAGNOSIS — Z940 Kidney transplant status: Principal | ICD-10-CM

## 2019-02-15 MED ORDER — PROGRAF 1 MG CAPSULE
ORAL_CAPSULE | 4 refills | 0 days
Start: 2019-02-15 — End: 2019-03-23

## 2019-03-01 ENCOUNTER — Other Ambulatory Visit
Admission: RE | Admit: 2019-03-01 | Discharge: 2019-03-01 | Disposition: A | Discharge status: Home / Self Care | Payer: BLUE CROSS/BLUE SHIELD | Attending: Nephrology | Admitting: Nephrology

## 2019-03-01 ENCOUNTER — Encounter: Admit: 2019-03-01 | Discharge: 2019-03-02 | Payer: PRIVATE HEALTH INSURANCE

## 2019-03-01 DIAGNOSIS — Z1159 Encounter for screening for other viral diseases: Principal | ICD-10-CM

## 2019-03-01 DIAGNOSIS — Z940 Kidney transplant status: Principal | ICD-10-CM

## 2019-03-01 DIAGNOSIS — Z79899 Other long term (current) drug therapy: Principal | ICD-10-CM

## 2019-03-01 DIAGNOSIS — D631 Anemia in chronic kidney disease: Secondary | ICD-10-CM | POA: Insufficient documentation

## 2019-03-01 DIAGNOSIS — E1021 Type 1 diabetes mellitus with diabetic nephropathy: Secondary | ICD-10-CM | POA: Diagnosis not present

## 2019-03-01 DIAGNOSIS — N189 Chronic kidney disease, unspecified: Secondary | ICD-10-CM | POA: Diagnosis not present

## 2019-03-01 DIAGNOSIS — N390 Urinary tract infection, site not specified: Secondary | ICD-10-CM | POA: Diagnosis not present

## 2019-03-01 DIAGNOSIS — D899 Disorder involving the immune mechanism, unspecified: Secondary | ICD-10-CM | POA: Diagnosis not present

## 2019-03-01 DIAGNOSIS — Z09 Encounter for follow-up examination after completed treatment for conditions other than malignant neoplasm: Secondary | ICD-10-CM | POA: Diagnosis not present

## 2019-03-01 DIAGNOSIS — Z789 Other specified health status: Secondary | ICD-10-CM | POA: Insufficient documentation

## 2019-03-01 DIAGNOSIS — E559 Vitamin D deficiency, unspecified: Secondary | ICD-10-CM | POA: Diagnosis not present

## 2019-03-01 DIAGNOSIS — E1129 Type 2 diabetes mellitus with other diabetic kidney complication: Secondary | ICD-10-CM | POA: Diagnosis not present

## 2019-03-01 DIAGNOSIS — B259 Cytomegaloviral disease, unspecified: Secondary | ICD-10-CM | POA: Diagnosis not present

## 2019-03-01 DIAGNOSIS — Z114 Encounter for screening for human immunodeficiency virus [HIV]: Secondary | ICD-10-CM | POA: Diagnosis not present

## 2019-03-01 DIAGNOSIS — Z9483 Pancreas transplant status: Secondary | ICD-10-CM | POA: Diagnosis not present

## 2019-03-01 DIAGNOSIS — E10641 Type 1 diabetes mellitus with hypoglycemia with coma: Secondary | ICD-10-CM | POA: Diagnosis not present

## 2019-03-01 DIAGNOSIS — E10649 Type 1 diabetes mellitus with hypoglycemia without coma: Secondary | ICD-10-CM | POA: Diagnosis not present

## 2019-03-01 LAB — BASIC METABOLIC PANEL
Anion gap: 7 (ref 5–15)
BUN: 37 mg/dL — ABNORMAL HIGH (ref 6–20)
CHLORIDE: 109 mmol/L (ref 98–111)
CO2: 24 mmol/L (ref 22–32)
CREATININE: 1.27 mg/dL — AB (ref 0.61–1.24)
Calcium: 9.4 mg/dL (ref 8.9–10.3)
GFR calc non Af Amer: >60 mL/min (ref >60)
Glucose, Bld: 97 mg/dL (ref 70–99)
POTASSIUM: 4.7 mmol/L (ref 3.5–5.1)
SODIUM: 140 mmol/L (ref 135–145)

## 2019-03-01 LAB — CBC WITH DIFFERENTIAL/PLATELET
Abs Immature Granulocytes: 0.01 10*3/uL (ref 0.00–0.07)
Basophils Absolute: 0.1 10*3/uL (ref 0.0–0.1)
Basophils Relative: 1 %
EOS ABS: 0.8 10*3/uL — AB (ref 0.0–0.5)
EOS PCT: 11 %
HEMATOCRIT: 34.7 % — AB (ref 39.0–52.0)
HEMOGLOBIN: 10.7 g/dL — AB (ref 13.0–17.0)
Immature Granulocytes: 0 %
LYMPHS ABS: 1.1 10*3/uL (ref 0.7–4.0)
LYMPHS PCT: 15 %
MCH: 31.1 pg (ref 26.0–34.0)
MCHC: 30.8 g/dL (ref 30.0–36.0)
MCV: 100.9 fL — ABNORMAL HIGH (ref 80.0–100.0)
MONO ABS: 0.6 10*3/uL (ref 0.1–1.0)
Monocytes Relative: 8 %
Neutro Abs: 4.8 10*3/uL (ref 1.7–7.7)
Neutrophils Relative %: 65 %
Platelets: 207 10*3/uL (ref 150–400)
RBC: 3.44 MIL/uL — ABNORMAL LOW (ref 4.22–5.81)
RDW: 12.8 % (ref 11.5–15.5)
WBC: 7.4 10*3/uL (ref 4.0–10.5)
nRBC: 0 % (ref 0.0–0.2)

## 2019-03-01 LAB — MAGNESIUM: Magnesium: 1.7 mg/dL (ref 1.7–2.4)

## 2019-03-01 LAB — PHOSPHORUS: PHOSPHORUS: 3.5 mg/dL (ref 2.5–4.6)

## 2019-03-08 DIAGNOSIS — C44722 Squamous cell carcinoma of skin of right lower limb, including hip: Secondary | ICD-10-CM | POA: Diagnosis not present

## 2019-03-13 DIAGNOSIS — E1159 Type 2 diabetes mellitus with other circulatory complications: Secondary | ICD-10-CM | POA: Diagnosis not present

## 2019-03-13 DIAGNOSIS — E1021 Type 1 diabetes mellitus with diabetic nephropathy: Secondary | ICD-10-CM | POA: Diagnosis not present

## 2019-03-13 DIAGNOSIS — E1069 Type 1 diabetes mellitus with other specified complication: Secondary | ICD-10-CM | POA: Diagnosis not present

## 2019-03-13 DIAGNOSIS — E785 Hyperlipidemia, unspecified: Secondary | ICD-10-CM | POA: Diagnosis not present

## 2019-03-15 DIAGNOSIS — I872 Venous insufficiency (chronic) (peripheral): Secondary | ICD-10-CM | POA: Diagnosis not present

## 2019-03-23 MED ORDER — PROGRAF 1 MG CAPSULE
ORAL_CAPSULE | 4 refills | 0 days | Status: CP
Start: 2019-03-23 — End: ?

## 2019-03-28 ENCOUNTER — Encounter
Admit: 2019-03-28 | Discharge: 2019-03-29 | Payer: PRIVATE HEALTH INSURANCE | Attending: Nephrology | Primary: Nephrology

## 2019-03-28 DIAGNOSIS — N2581 Secondary hyperparathyroidism of renal origin: Secondary | ICD-10-CM

## 2019-03-28 DIAGNOSIS — Z940 Kidney transplant status: Principal | ICD-10-CM

## 2019-03-28 DIAGNOSIS — E785 Hyperlipidemia, unspecified: Secondary | ICD-10-CM

## 2019-03-28 DIAGNOSIS — D899 Disorder involving the immune mechanism, unspecified: Secondary | ICD-10-CM

## 2019-03-28 DIAGNOSIS — D631 Anemia in chronic kidney disease: Secondary | ICD-10-CM

## 2019-03-28 DIAGNOSIS — I10 Essential (primary) hypertension: Secondary | ICD-10-CM

## 2019-03-28 DIAGNOSIS — N183 Chronic kidney disease, stage 3 (moderate): Secondary | ICD-10-CM

## 2019-04-01 ENCOUNTER — Other Ambulatory Visit: Payer: Self-pay | Admitting: Family Medicine

## 2019-05-02 DIAGNOSIS — E10641 Type 1 diabetes mellitus with hypoglycemia with coma: Secondary | ICD-10-CM | POA: Diagnosis not present

## 2019-05-02 DIAGNOSIS — E10649 Type 1 diabetes mellitus with hypoglycemia without coma: Secondary | ICD-10-CM | POA: Diagnosis not present

## 2019-05-02 DIAGNOSIS — E1021 Type 1 diabetes mellitus with diabetic nephropathy: Secondary | ICD-10-CM | POA: Diagnosis not present

## 2019-05-30 DIAGNOSIS — E10649 Type 1 diabetes mellitus with hypoglycemia without coma: Secondary | ICD-10-CM | POA: Diagnosis not present

## 2019-05-30 DIAGNOSIS — E1021 Type 1 diabetes mellitus with diabetic nephropathy: Secondary | ICD-10-CM | POA: Diagnosis not present

## 2019-05-30 DIAGNOSIS — E10641 Type 1 diabetes mellitus with hypoglycemia with coma: Secondary | ICD-10-CM | POA: Diagnosis not present

## 2019-06-01 ENCOUNTER — Other Ambulatory Visit: Payer: Self-pay

## 2019-06-01 ENCOUNTER — Other Ambulatory Visit
Admission: RE | Admit: 2019-06-01 | Discharge: 2019-06-01 | Disposition: A | Discharge status: Home / Self Care | Payer: BC Managed Care – PPO | Attending: Nephrology | Admitting: Nephrology

## 2019-06-01 ENCOUNTER — Other Ambulatory Visit: Admit: 2019-06-01 | Discharge: 2019-06-02 | Payer: PRIVATE HEALTH INSURANCE

## 2019-06-01 DIAGNOSIS — Z79899 Other long term (current) drug therapy: Secondary | ICD-10-CM

## 2019-06-01 DIAGNOSIS — Z940 Kidney transplant status: Principal | ICD-10-CM

## 2019-06-01 DIAGNOSIS — Z1159 Encounter for screening for other viral diseases: Secondary | ICD-10-CM

## 2019-06-01 DIAGNOSIS — Z114 Encounter for screening for human immunodeficiency virus [HIV]: Secondary | ICD-10-CM | POA: Diagnosis not present

## 2019-06-01 DIAGNOSIS — E1122 Type 2 diabetes mellitus with diabetic chronic kidney disease: Secondary | ICD-10-CM | POA: Insufficient documentation

## 2019-06-01 DIAGNOSIS — E559 Vitamin D deficiency, unspecified: Secondary | ICD-10-CM | POA: Insufficient documentation

## 2019-06-01 DIAGNOSIS — B259 Cytomegaloviral disease, unspecified: Secondary | ICD-10-CM | POA: Insufficient documentation

## 2019-06-01 DIAGNOSIS — E1129 Type 2 diabetes mellitus with other diabetic kidney complication: Secondary | ICD-10-CM | POA: Diagnosis not present

## 2019-06-01 DIAGNOSIS — Z789 Other specified health status: Secondary | ICD-10-CM | POA: Insufficient documentation

## 2019-06-01 DIAGNOSIS — Z9483 Pancreas transplant status: Secondary | ICD-10-CM | POA: Insufficient documentation

## 2019-06-01 DIAGNOSIS — D899 Disorder involving the immune mechanism, unspecified: Secondary | ICD-10-CM | POA: Insufficient documentation

## 2019-06-01 DIAGNOSIS — D631 Anemia in chronic kidney disease: Secondary | ICD-10-CM | POA: Diagnosis not present

## 2019-06-01 DIAGNOSIS — N390 Urinary tract infection, site not specified: Secondary | ICD-10-CM | POA: Diagnosis not present

## 2019-06-01 LAB — CBC WITH DIFFERENTIAL/PLATELET
Abs Immature Granulocytes: 0.01 10*3/uL (ref 0.00–0.07)
Basophils Absolute: 0.1 10*3/uL (ref 0.0–0.1)
Basophils Relative: 1 %
Eosinophils Absolute: 0.7 10*3/uL — ABNORMAL HIGH (ref 0.0–0.5)
Eosinophils Relative: 12 %
HCT: 31.6 % — ABNORMAL LOW (ref 39.0–52.0)
Hemoglobin: 10 g/dL — ABNORMAL LOW (ref 13.0–17.0)
Immature Granulocytes: 0 %
Lymphocytes Relative: 20 %
Lymphs Abs: 1.2 10*3/uL (ref 0.7–4.0)
MCH: 31.2 pg (ref 26.0–34.0)
MCHC: 31.6 g/dL (ref 30.0–36.0)
MCV: 98.4 fL (ref 80.0–100.0)
Monocytes Absolute: 0.5 10*3/uL (ref 0.1–1.0)
Monocytes Relative: 9 %
Neutro Abs: 3.5 10*3/uL (ref 1.7–7.7)
Neutrophils Relative %: 58 %
Platelets: 169 10*3/uL (ref 150–400)
RBC: 3.21 MIL/uL — ABNORMAL LOW (ref 4.22–5.81)
RDW: 13.1 % (ref 11.5–15.5)
WBC: 6 10*3/uL (ref 4.0–10.5)
nRBC: 0 % (ref 0.0–0.2)

## 2019-06-01 LAB — PHOSPHORUS: Phosphorus: 3.7 mg/dL (ref 2.5–4.6)

## 2019-06-01 LAB — BASIC METABOLIC PANEL
Anion gap: 6 (ref 5–15)
BUN: 43 mg/dL — ABNORMAL HIGH (ref 6–20)
CO2: 23 mmol/L (ref 22–32)
Calcium: 9.1 mg/dL (ref 8.9–10.3)
Chloride: 109 mmol/L (ref 98–111)
Creatinine, Ser: 1.35 mg/dL — ABNORMAL HIGH (ref 0.61–1.24)
GFR calc Af Amer: >60 mL/min (ref >60)
GFR calc non Af Amer: 57 mL/min — ABNORMAL LOW (ref >60)
Glucose, Bld: 171 mg/dL — ABNORMAL HIGH (ref 70–99)
Potassium: 4.8 mmol/L (ref 3.5–5.1)
Sodium: 138 mmol/L (ref 135–145)

## 2019-06-01 LAB — MAGNESIUM: Magnesium: 1.8 mg/dL (ref 1.7–2.4)

## 2019-07-10 ENCOUNTER — Other Ambulatory Visit
Admission: RE | Admit: 2019-07-10 | Discharge: 2019-07-10 | Disposition: A | Discharge status: Home / Self Care | Payer: BC Managed Care – PPO | Attending: Nephrology | Admitting: Nephrology

## 2019-07-10 ENCOUNTER — Other Ambulatory Visit: Payer: Self-pay

## 2019-07-10 ENCOUNTER — Other Ambulatory Visit: Admit: 2019-07-10 | Discharge: 2019-07-11 | Payer: PRIVATE HEALTH INSURANCE

## 2019-07-10 DIAGNOSIS — Z940 Kidney transplant status: Principal | ICD-10-CM

## 2019-07-10 DIAGNOSIS — Z79899 Other long term (current) drug therapy: Secondary | ICD-10-CM

## 2019-07-10 DIAGNOSIS — Z1159 Encounter for screening for other viral diseases: Secondary | ICD-10-CM

## 2019-07-10 DIAGNOSIS — Z09 Encounter for follow-up examination after completed treatment for conditions other than malignant neoplasm: Secondary | ICD-10-CM | POA: Insufficient documentation

## 2019-07-10 DIAGNOSIS — Z789 Other specified health status: Secondary | ICD-10-CM | POA: Insufficient documentation

## 2019-07-10 DIAGNOSIS — Z114 Encounter for screening for human immunodeficiency virus [HIV]: Secondary | ICD-10-CM | POA: Diagnosis not present

## 2019-07-10 DIAGNOSIS — D899 Disorder involving the immune mechanism, unspecified: Secondary | ICD-10-CM | POA: Diagnosis not present

## 2019-07-10 DIAGNOSIS — B259 Cytomegaloviral disease, unspecified: Secondary | ICD-10-CM | POA: Diagnosis not present

## 2019-07-10 DIAGNOSIS — E1129 Type 2 diabetes mellitus with other diabetic kidney complication: Secondary | ICD-10-CM | POA: Insufficient documentation

## 2019-07-10 DIAGNOSIS — Z9483 Pancreas transplant status: Secondary | ICD-10-CM | POA: Insufficient documentation

## 2019-07-10 DIAGNOSIS — N390 Urinary tract infection, site not specified: Secondary | ICD-10-CM | POA: Insufficient documentation

## 2019-07-10 DIAGNOSIS — E559 Vitamin D deficiency, unspecified: Secondary | ICD-10-CM | POA: Insufficient documentation

## 2019-07-10 DIAGNOSIS — D631 Anemia in chronic kidney disease: Secondary | ICD-10-CM | POA: Insufficient documentation

## 2019-07-10 LAB — CBC WITH DIFFERENTIAL/PLATELET
Abs Immature Granulocytes: 0.01 10*3/uL (ref 0.00–0.07)
Basophils Absolute: 0.1 10*3/uL (ref 0.0–0.1)
Basophils Relative: 1 %
Eosinophils Absolute: 0.6 10*3/uL — ABNORMAL HIGH (ref 0.0–0.5)
Eosinophils Relative: 9 %
HCT: 31.9 % — ABNORMAL LOW (ref 39.0–52.0)
Hemoglobin: 10 g/dL — ABNORMAL LOW (ref 13.0–17.0)
Immature Granulocytes: 0 %
Lymphocytes Relative: 18 %
Lymphs Abs: 1.2 10*3/uL (ref 0.7–4.0)
MCH: 31.3 pg (ref 26.0–34.0)
MCHC: 31.3 g/dL (ref 30.0–36.0)
MCV: 100 fL (ref 80.0–100.0)
Monocytes Absolute: 0.5 10*3/uL (ref 0.1–1.0)
Monocytes Relative: 8 %
Neutro Abs: 4.3 10*3/uL (ref 1.7–7.7)
Neutrophils Relative %: 64 %
Platelets: 177 10*3/uL (ref 150–400)
RBC: 3.19 MIL/uL — ABNORMAL LOW (ref 4.22–5.81)
RDW: 13 % (ref 11.5–15.5)
WBC: 6.7 10*3/uL (ref 4.0–10.5)
nRBC: 0 % (ref 0.0–0.2)

## 2019-07-10 LAB — BASIC METABOLIC PANEL
Anion gap: 5 (ref 5–15)
BUN: 52 mg/dL — ABNORMAL HIGH (ref 8–23)
CO2: 23 mmol/L (ref 22–32)
Calcium: 9.5 mg/dL (ref 8.9–10.3)
Chloride: 110 mmol/L (ref 98–111)
Creatinine, Ser: 1.62 mg/dL — ABNORMAL HIGH (ref 0.61–1.24)
GFR calc Af Amer: 52 mL/min — ABNORMAL LOW (ref >60)
GFR calc non Af Amer: 45 mL/min — ABNORMAL LOW (ref >60)
Glucose, Bld: 176 mg/dL — ABNORMAL HIGH (ref 70–99)
Potassium: 5.3 mmol/L — ABNORMAL HIGH (ref 3.5–5.1)
Sodium: 138 mmol/L (ref 135–145)

## 2019-07-10 LAB — PHOSPHORUS: Phosphorus: 3.7 mg/dL (ref 2.5–4.6)

## 2019-07-10 LAB — MAGNESIUM: Magnesium: 1.8 mg/dL (ref 1.7–2.4)

## 2019-07-11 DIAGNOSIS — H401134 Primary open-angle glaucoma, bilateral, indeterminate stage: Secondary | ICD-10-CM | POA: Diagnosis not present

## 2019-07-25 ENCOUNTER — Other Ambulatory Visit: Payer: Self-pay

## 2019-07-25 ENCOUNTER — Encounter: Payer: Self-pay | Admitting: Emergency Medicine

## 2019-07-25 ENCOUNTER — Ambulatory Visit
Admission: EM | Admit: 2019-07-25 | Discharge: 2019-07-25 | Disposition: A | Discharge status: Home / Self Care | Payer: BC Managed Care – PPO | Attending: Family Medicine | Admitting: Family Medicine

## 2019-07-25 DIAGNOSIS — Z20822 Contact with and (suspected) exposure to covid-19: Secondary | ICD-10-CM

## 2019-07-25 DIAGNOSIS — Z20828 Contact with and (suspected) exposure to other viral communicable diseases: Secondary | ICD-10-CM

## 2019-07-25 NOTE — Discharge Instructions (Signed)
We will call with the results.  Take care  Dr. Noble Bodie  

## 2019-07-25 NOTE — ED Provider Notes (Signed)
[facility]    CSN: [id number] Arrival date & time: 07/25/19  1719      History   Chief Complaint Chief Complaint  Patient presents with  . COVID testing   HPI  63 year old male presents for COVID testing.  Patient reports that a coworker recently tested positive for COVID-19.  His employer has recommended that he as well as other individuals get testing.  Patient states that he is currently feeling well.  Denies cough, fever, shortness of breath.  Patient desires testing today.  He is currently excused from work until his testing returns.  No other reported symptoms.  No other complaints.  PMH, Surgical Hx, Family Hx, Social History reviewed and updated as below.  PMH: Patient Active Problem List   Diagnosis Date Noted  . Lymphedema 05/31/2017  . Chronic venous insufficiency 05/31/2017  . Special screening for malignant neoplasms, colon   . Type I diabetes mellitus ([place]) 02/03/2016  . Diabetic retinopathy ([place]) 02/03/2016  . 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 End-stage renal disease ([place]) 02/03/2016  . Glaucoma 02/03/2016  . HLD (hyperlipidemia) 02/03/2016  . Kidney failure 02/03/2016  . Basal cell carcinoma of back 02/25/2015  . H/O thrombosis 02/12/2012  . BP (high blood pressure) 01/13/2011  . H/O kidney transplant 07/08/2010    Past Surgical History:  Procedure Laterality Date  . CATARACT EXTRACTION    . COLONOSCOPY WITH PROPOFOL N/A 04/06/2016   Procedure: COLONOSCOPY WITH PROPOFOL;  Surgeon: Lucilla Lame, MD;  Location: [facility];  Service: Endoscopy;  Laterality: N/A;  . HERNIA REPAIR  7/85/88   umbilical  . PERITONEAL CATHETER INSERTION    . TRANSPLANTATION RENAL Right 09/01/11       Home Medications    Prior to Admission medications   Medication Sig Start Date End Date Taking? Authorizing Provider  aspirin 81 MG tablet Take by mouth. 08/08/13  Yes [provider]  atorvastatin (LIPITOR) 20 MG  tablet Take by mouth. 08/08/13  Yes [provider]  Brinzolamide-Brimonidine [facility]) 1-0.2 % SUSP Apply to eye. 08/08/13  Yes [provider]  docusate sodium (COLACE) 100 MG capsule Take by mouth. 08/08/13  Yes [provider]  ferrous sulfate 325 (65 FE) MG tablet Take by mouth. 08/08/13  Yes [provider]  insulin aspart (NOVOLOG) 100 UNIT/ML injection INJECT 7 UNITS UNDER THE SKIN BEFORE BREAKFAST, 5 UNITS BEFORE LUNCH AND 4 UNITS BEFORE DINNER 04/03/17  Yes Birdie Sons, MD  Insulin Glargine (TOUJEO SOLOSTAR) 300 UNIT/ML SOPN Inject 16 Units into the skin daily.    Yes [provider]  Insulin Syringe-Needle U-100 (BD INSULIN SYRINGE ULTRAFINE) 31G X 5/16" 0.5 ML MISC  10/09/13  Yes [provider]  mycophenolate (MYFORTIC) 180 MG EC tablet Take by mouth. 08/08/13  Yes [provider]  NOVOLOG 100 UNIT/ML injection INJECT 7 UNITS UNDER THE SKIN BEFORE BREAKFAST, 5 UNITS BEFORE LUNCH AND 4 UNITS BEFORE DINNER 04/03/19  Yes Jerrol Banana., MD  [facility] LANCETS FINE [place]  10/09/13  Yes [provider]  Polyethylene Glycol 3350 GRAN Take by mouth. 08/08/13  Yes [provider]  SENSIPAR 60 MG tablet  05/20/17  Yes [provider]  tacrolimus (PROGRAF) 1 MG capsule  05/10/15  Yes [provider]  furosemide (LASIX) 20 MG tablet Take 20 mg by mouth. 07/16/15 07/15/16  [provider]  lisinopril (PRINIVIL,ZESTRIL) 40 MG tablet Take 40 mg by mouth. 08/15/15 08/14/16  [provider]    Family History Family History  Problem Relation Age of Onset  . Diabetes Mother   . Congestive Heart Failure Mother   . Prostate cancer Father   . Diabetes Father   . Kidney disease Father     Social History Social History   Tobacco Use  . Smoking status: Never Smoker  . Smokeless tobacco: Never Used  Substance Use Topics  . Alcohol use: No  . Drug use: No     Allergies    Patient has no known allergies.   Review of Systems Review of Systems  Constitutional: Negative for fever.  Respiratory: Negative.    Physical Exam Triage Vital Signs ED Triage Vitals  Enc Vitals Group     BP 07/25/19 1747 (!) 150/74     Pulse Rate 07/25/19 1747 64     Resp 07/25/19 1747 18     Temp 07/25/19 1747 98.2 F (36.8 C)     Temp Source 07/25/19 1747 Oral     SpO2 07/25/19 1747 100 %     Weight 07/25/19 1745 145 lb (65.8 kg)     Height 07/25/19 1745 5\' 10"  (1.778 m)     Head Circumference --      Peak Flow --      Pain Score 07/25/19 1745 0     Pain Loc --      Pain Edu? --      Excl. in [place]? --    Updated Vital Signs BP (!) 150/74 (BP Location: Right Arm)   Pulse 64   Temp 98.2 F (36.8 C) (Oral)   Resp 18   Ht 5\' 10"  (1.778 m)   Wt 65.8 kg   SpO2 100%   BMI 20.81 kg/m   Visual Acuity Right Eye Distance:   Left Eye Distance:   Bilateral Distance:    Right Eye Near:   Left Eye Near:    Bilateral Near:     Physical Exam Vitals signs and nursing note reviewed.  Constitutional:      General: He is not in acute distress.    Appearance: Normal appearance.  HENT:     Head: Normocephalic and atraumatic.  Eyes:     General:        Right eye: No discharge.        Left eye: No discharge.     Conjunctiva/sclera: Conjunctivae normal.  Cardiovascular:     Rate and Rhythm: Normal rate and regular rhythm.  Pulmonary:     Effort: Pulmonary effort is normal.     Breath sounds: No wheezing, rhonchi or rales.  Neurological:     Mental Status: He is alert.  Psychiatric:        Mood and Affect: Mood normal.        Behavior: Behavior normal.    UC Treatments / Results  Labs (all labs ordered are listed, but only abnormal results are displayed) Labs Reviewed  NOVEL CORONAVIRUS, NAA (HOSPITAL ORDER, SEND-OUT TO REF LAB)    EKG   Radiology No results found.  Procedures Procedures (including critical care time)  Medications Ordered in UC  Medications - No data to display  Initial Impression / Assessment and Plan / UC Course  I have reviewed the triage vital signs and the nursing notes.  Pertinent labs & imaging results that were available during my care of the patient were reviewed by me and considered in my medical decision making (see chart for details).    63 year old male presents  for COVID testing.  Testing done today.  Will call with results.  Final Clinical Impressions(s) / UC Diagnoses   Final diagnoses:  Exposure to Covid-19 Virus     Discharge Instructions     We will call with the results.  Take care  Dr. Lacinda Axon    ED Prescriptions    None     Controlled Substance Prescriptions [place] Controlled Substance Registry consulted? Not Applicable   Coral Spikes, DO 07/25/19 1811

## 2019-07-25 NOTE — ED Triage Notes (Signed)
Patient was around a Art therapist that tested positive over the weekend. Patient has no symptoms.

## 2019-07-27 LAB — NOVEL CORONAVIRUS, NAA (HOSP ORDER, SEND-OUT TO REF LAB; TAT 18-24 HRS): SARS-CoV-2, NAA: NOT DETECTED

## 2019-08-04 DIAGNOSIS — E1021 Type 1 diabetes mellitus with diabetic nephropathy: Secondary | ICD-10-CM | POA: Diagnosis not present

## 2019-08-04 DIAGNOSIS — E10641 Type 1 diabetes mellitus with hypoglycemia with coma: Secondary | ICD-10-CM | POA: Diagnosis not present

## 2019-08-04 DIAGNOSIS — E10649 Type 1 diabetes mellitus with hypoglycemia without coma: Secondary | ICD-10-CM | POA: Diagnosis not present

## 2019-08-10 MED ORDER — MYCOPHENOLATE SODIUM 360 MG TABLET,DELAYED RELEASE
ORAL_TABLET | 3 refills | 0 days | Status: CP
Start: 2019-08-10 — End: ?

## 2019-08-10 MED ORDER — CINACALCET 60 MG TABLET
ORAL_TABLET | Freq: Every day | ORAL | PRN refills | 90.00000 days | Status: CP
Start: 2019-08-10 — End: 2019-08-11

## 2019-08-11 MED ORDER — CINACALCET 60 MG TABLET
Freq: Every day | ORAL | 0 refills | 30.00000 days | Status: CP
Start: 2019-08-11 — End: 2020-08-10

## 2019-08-15 ENCOUNTER — Other Ambulatory Visit: Payer: Self-pay

## 2019-08-15 ENCOUNTER — Other Ambulatory Visit
Admission: RE | Admit: 2019-08-15 | Discharge: 2019-08-15 | Disposition: A | Discharge status: Home / Self Care | Payer: BC Managed Care – PPO | Attending: Nephrology | Admitting: Nephrology

## 2019-08-15 ENCOUNTER — Encounter: Admit: 2019-08-15 | Discharge: 2019-08-16 | Payer: PRIVATE HEALTH INSURANCE

## 2019-08-15 DIAGNOSIS — Z940 Kidney transplant status: Secondary | ICD-10-CM

## 2019-08-15 DIAGNOSIS — Z1159 Encounter for screening for other viral diseases: Secondary | ICD-10-CM

## 2019-08-15 DIAGNOSIS — Z79899 Other long term (current) drug therapy: Secondary | ICD-10-CM

## 2019-08-15 DIAGNOSIS — E1129 Type 2 diabetes mellitus with other diabetic kidney complication: Secondary | ICD-10-CM | POA: Insufficient documentation

## 2019-08-15 DIAGNOSIS — Z789 Other specified health status: Secondary | ICD-10-CM | POA: Insufficient documentation

## 2019-08-15 DIAGNOSIS — B259 Cytomegaloviral disease, unspecified: Secondary | ICD-10-CM | POA: Insufficient documentation

## 2019-08-15 DIAGNOSIS — Z9483 Pancreas transplant status: Secondary | ICD-10-CM | POA: Diagnosis not present

## 2019-08-15 DIAGNOSIS — N390 Urinary tract infection, site not specified: Secondary | ICD-10-CM | POA: Insufficient documentation

## 2019-08-15 DIAGNOSIS — Z09 Encounter for follow-up examination after completed treatment for conditions other than malignant neoplasm: Secondary | ICD-10-CM | POA: Insufficient documentation

## 2019-08-15 DIAGNOSIS — D899 Disorder involving the immune mechanism, unspecified: Secondary | ICD-10-CM | POA: Diagnosis not present

## 2019-08-15 DIAGNOSIS — Z114 Encounter for screening for human immunodeficiency virus [HIV]: Secondary | ICD-10-CM | POA: Insufficient documentation

## 2019-08-15 DIAGNOSIS — E559 Vitamin D deficiency, unspecified: Secondary | ICD-10-CM | POA: Diagnosis not present

## 2019-08-15 DIAGNOSIS — D631 Anemia in chronic kidney disease: Secondary | ICD-10-CM | POA: Diagnosis not present

## 2019-08-15 LAB — CBC WITH DIFFERENTIAL/PLATELET
Abs Immature Granulocytes: 0.02 10*3/uL (ref 0.00–0.07)
Basophils Absolute: 0.1 10*3/uL (ref 0.0–0.1)
Basophils Relative: 1 %
Eosinophils Absolute: 0.5 10*3/uL (ref 0.0–0.5)
Eosinophils Relative: 8 %
HCT: 30.5 % — ABNORMAL LOW (ref 39.0–52.0)
Hemoglobin: 9.6 g/dL — ABNORMAL LOW (ref 13.0–17.0)
Immature Granulocytes: 0 %
Lymphocytes Relative: 17 %
Lymphs Abs: 1 10*3/uL (ref 0.7–4.0)
MCH: 30.9 pg (ref 26.0–34.0)
MCHC: 31.5 g/dL (ref 30.0–36.0)
MCV: 98.1 fL (ref 80.0–100.0)
Monocytes Absolute: 0.4 10*3/uL (ref 0.1–1.0)
Monocytes Relative: 8 %
Neutro Abs: 3.8 10*3/uL (ref 1.7–7.7)
Neutrophils Relative %: 66 %
Platelets: 185 10*3/uL (ref 150–400)
RBC: 3.11 MIL/uL — ABNORMAL LOW (ref 4.22–5.81)
RDW: 13.1 % (ref 11.5–15.5)
WBC: 5.8 10*3/uL (ref 4.0–10.5)
nRBC: 0 % (ref 0.0–0.2)

## 2019-08-15 LAB — BASIC METABOLIC PANEL
Anion gap: 9 (ref 5–15)
BUN: 54 mg/dL — ABNORMAL HIGH (ref 8–23)
CO2: 24 mmol/L (ref 22–32)
Calcium: 9.5 mg/dL (ref 8.9–10.3)
Chloride: 110 mmol/L (ref 98–111)
Creatinine, Ser: 1.43 mg/dL — ABNORMAL HIGH (ref 0.61–1.24)
GFR calc Af Amer: >60 mL/min (ref >60)
GFR calc non Af Amer: 52 mL/min — ABNORMAL LOW (ref >60)
Glucose, Bld: 80 mg/dL (ref 70–99)
Potassium: 4.7 mmol/L (ref 3.5–5.1)
Sodium: 143 mmol/L (ref 135–145)

## 2019-08-15 LAB — PHOSPHORUS: Phosphorus: 3.6 mg/dL (ref 2.5–4.6)

## 2019-08-15 LAB — MAGNESIUM: Magnesium: 1.8 mg/dL (ref 1.7–2.4)

## 2019-08-16 DIAGNOSIS — Z86018 Personal history of other benign neoplasm: Secondary | ICD-10-CM | POA: Diagnosis not present

## 2019-08-16 DIAGNOSIS — Z872 Personal history of diseases of the skin and subcutaneous tissue: Secondary | ICD-10-CM | POA: Diagnosis not present

## 2019-08-16 DIAGNOSIS — Z85828 Personal history of other malignant neoplasm of skin: Secondary | ICD-10-CM | POA: Diagnosis not present

## 2019-08-16 DIAGNOSIS — L578 Other skin changes due to chronic exposure to nonionizing radiation: Secondary | ICD-10-CM | POA: Diagnosis not present

## 2019-08-23 ENCOUNTER — Encounter: Admit: 2019-08-23 | Discharge: 2019-08-23 | Payer: PRIVATE HEALTH INSURANCE

## 2019-08-23 ENCOUNTER — Encounter
Admit: 2019-08-23 | Discharge: 2019-08-23 | Payer: PRIVATE HEALTH INSURANCE | Attending: Nephrology | Primary: Nephrology

## 2019-08-23 DIAGNOSIS — R600 Localized edema: Secondary | ICD-10-CM

## 2019-08-23 DIAGNOSIS — Z794 Long term (current) use of insulin: Secondary | ICD-10-CM

## 2019-08-23 DIAGNOSIS — E109 Type 1 diabetes mellitus without complications: Secondary | ICD-10-CM

## 2019-08-23 DIAGNOSIS — Z79899 Other long term (current) drug therapy: Secondary | ICD-10-CM

## 2019-08-23 DIAGNOSIS — D899 Disorder involving the immune mechanism, unspecified: Secondary | ICD-10-CM

## 2019-08-23 DIAGNOSIS — Z940 Kidney transplant status: Secondary | ICD-10-CM

## 2019-08-23 DIAGNOSIS — E785 Hyperlipidemia, unspecified: Secondary | ICD-10-CM

## 2019-08-23 DIAGNOSIS — I10 Essential (primary) hypertension: Secondary | ICD-10-CM

## 2019-08-23 DIAGNOSIS — D649 Anemia, unspecified: Secondary | ICD-10-CM

## 2019-08-23 DIAGNOSIS — Z1159 Encounter for screening for other viral diseases: Secondary | ICD-10-CM

## 2019-08-23 DIAGNOSIS — Z09 Encounter for follow-up examination after completed treatment for conditions other than malignant neoplasm: Secondary | ICD-10-CM

## 2019-08-23 DIAGNOSIS — R634 Abnormal weight loss: Secondary | ICD-10-CM

## 2019-09-01 DIAGNOSIS — E10649 Type 1 diabetes mellitus with hypoglycemia without coma: Secondary | ICD-10-CM | POA: Diagnosis not present

## 2019-09-01 DIAGNOSIS — E1021 Type 1 diabetes mellitus with diabetic nephropathy: Secondary | ICD-10-CM | POA: Diagnosis not present

## 2019-09-01 DIAGNOSIS — E10641 Type 1 diabetes mellitus with hypoglycemia with coma: Secondary | ICD-10-CM | POA: Diagnosis not present

## 2019-09-15 DIAGNOSIS — E1069 Type 1 diabetes mellitus with other specified complication: Secondary | ICD-10-CM | POA: Diagnosis not present

## 2019-09-15 DIAGNOSIS — E785 Hyperlipidemia, unspecified: Secondary | ICD-10-CM | POA: Diagnosis not present

## 2019-09-15 DIAGNOSIS — E1159 Type 2 diabetes mellitus with other circulatory complications: Secondary | ICD-10-CM | POA: Diagnosis not present

## 2019-09-15 DIAGNOSIS — E1021 Type 1 diabetes mellitus with diabetic nephropathy: Secondary | ICD-10-CM | POA: Diagnosis not present

## 2019-09-22 DIAGNOSIS — Z940 Kidney transplant status: Secondary | ICD-10-CM

## 2019-09-22 MED ORDER — CINACALCET 60 MG TABLET
ORAL_TABLET | Freq: Every day | ORAL | 3 refills | 90.00000 days | Status: CP
Start: 2019-09-22 — End: 2020-09-21

## 2019-10-02 ENCOUNTER — Other Ambulatory Visit: Payer: Self-pay

## 2019-10-02 ENCOUNTER — Other Ambulatory Visit
Admission: RE | Admit: 2019-10-02 | Discharge: 2019-10-02 | Disposition: A | Discharge status: Home / Self Care | Payer: BC Managed Care – PPO | Attending: Nephrology | Admitting: Nephrology

## 2019-10-02 ENCOUNTER — Ambulatory Visit: Admit: 2019-10-02 | Discharge: 2019-10-03 | Payer: PRIVATE HEALTH INSURANCE

## 2019-10-02 DIAGNOSIS — Z114 Encounter for screening for human immunodeficiency virus [HIV]: Secondary | ICD-10-CM | POA: Diagnosis not present

## 2019-10-02 DIAGNOSIS — N390 Urinary tract infection, site not specified: Secondary | ICD-10-CM | POA: Insufficient documentation

## 2019-10-02 DIAGNOSIS — Z79899 Other long term (current) drug therapy: Secondary | ICD-10-CM | POA: Diagnosis not present

## 2019-10-02 DIAGNOSIS — Z9483 Pancreas transplant status: Secondary | ICD-10-CM | POA: Insufficient documentation

## 2019-10-02 DIAGNOSIS — E559 Vitamin D deficiency, unspecified: Secondary | ICD-10-CM | POA: Diagnosis not present

## 2019-10-02 DIAGNOSIS — D899 Disorder involving the immune mechanism, unspecified: Secondary | ICD-10-CM | POA: Diagnosis not present

## 2019-10-02 DIAGNOSIS — D631 Anemia in chronic kidney disease: Secondary | ICD-10-CM | POA: Diagnosis not present

## 2019-10-02 DIAGNOSIS — E1122 Type 2 diabetes mellitus with diabetic chronic kidney disease: Secondary | ICD-10-CM | POA: Insufficient documentation

## 2019-10-02 DIAGNOSIS — E1129 Type 2 diabetes mellitus with other diabetic kidney complication: Secondary | ICD-10-CM | POA: Diagnosis not present

## 2019-10-02 DIAGNOSIS — B259 Cytomegaloviral disease, unspecified: Secondary | ICD-10-CM | POA: Diagnosis not present

## 2019-10-02 DIAGNOSIS — Z1159 Encounter for screening for other viral diseases: Secondary | ICD-10-CM | POA: Diagnosis not present

## 2019-10-02 DIAGNOSIS — Z940 Kidney transplant status: Secondary | ICD-10-CM | POA: Insufficient documentation

## 2019-10-02 LAB — BASIC METABOLIC PANEL
Anion gap: 7 (ref 5–15)
BUN: 42 mg/dL — ABNORMAL HIGH (ref 8–23)
CO2: 23 mmol/L (ref 22–32)
Calcium: 10.2 mg/dL (ref 8.9–10.3)
Chloride: 108 mmol/L (ref 98–111)
Creatinine, Ser: 1.31 mg/dL — ABNORMAL HIGH (ref 0.61–1.24)
GFR calc Af Amer: >60 mL/min (ref >60)
GFR calc non Af Amer: 58 mL/min — ABNORMAL LOW (ref >60)
Glucose, Bld: 128 mg/dL — ABNORMAL HIGH (ref 70–99)
Potassium: 4.8 mmol/L (ref 3.5–5.1)
Sodium: 138 mmol/L (ref 135–145)

## 2019-10-02 LAB — CBC WITH DIFFERENTIAL/PLATELET
Abs Immature Granulocytes: 0.01 10*3/uL (ref 0.00–0.07)
Basophils Absolute: 0.1 10*3/uL (ref 0.0–0.1)
Basophils Relative: 1 %
Eosinophils Absolute: 0.8 10*3/uL — ABNORMAL HIGH (ref 0.0–0.5)
Eosinophils Relative: 15 %
HCT: 32.1 % — ABNORMAL LOW (ref 39.0–52.0)
Hemoglobin: 10.1 g/dL — ABNORMAL LOW (ref 13.0–17.0)
Immature Granulocytes: 0 %
Lymphocytes Relative: 19 %
Lymphs Abs: 1 10*3/uL (ref 0.7–4.0)
MCH: 30.7 pg (ref 26.0–34.0)
MCHC: 31.5 g/dL (ref 30.0–36.0)
MCV: 97.6 fL (ref 80.0–100.0)
Monocytes Absolute: 0.4 10*3/uL (ref 0.1–1.0)
Monocytes Relative: 8 %
Neutro Abs: 3 10*3/uL (ref 1.7–7.7)
Neutrophils Relative %: 57 %
Platelets: 214 10*3/uL (ref 150–400)
RBC: 3.29 MIL/uL — ABNORMAL LOW (ref 4.22–5.81)
RDW: 12.7 % (ref 11.5–15.5)
WBC: 5.2 10*3/uL (ref 4.0–10.5)
nRBC: 0 % (ref 0.0–0.2)

## 2019-10-02 LAB — PHOSPHORUS: Phosphorus: 3 mg/dL (ref 2.5–4.6)

## 2019-10-02 LAB — MAGNESIUM: Magnesium: 1.8 mg/dL (ref 1.7–2.4)

## 2019-10-13 ENCOUNTER — Telehealth: Payer: Self-pay | Admitting: Family Medicine

## 2019-10-13 NOTE — Telephone Encounter (Signed)
Pt needing to have his colonoscapy recheck done.  He is 2 years past due.  He is asking for the same one he was referred to last time.  Thanks, [employer]

## 2019-10-16 NOTE — Telephone Encounter (Signed)
Advised patient that he is not due for colonoscopy, last colonoscopy was done on 04/06/16 and patient was suppose to return for a repeat in 10 yrs

## 2019-11-06 ENCOUNTER — Other Ambulatory Visit: Payer: Self-pay

## 2019-11-06 ENCOUNTER — Other Ambulatory Visit
Admission: RE | Admit: 2019-11-06 | Discharge: 2019-11-06 | Disposition: A | Discharge status: Home / Self Care | Payer: BC Managed Care – PPO | Attending: Nephrology | Admitting: Nephrology

## 2019-11-06 ENCOUNTER — Encounter: Admit: 2019-11-06 | Discharge: 2019-11-07 | Payer: PRIVATE HEALTH INSURANCE

## 2019-11-06 DIAGNOSIS — D899 Disorder involving the immune mechanism, unspecified: Secondary | ICD-10-CM | POA: Insufficient documentation

## 2019-11-06 DIAGNOSIS — Z09 Encounter for follow-up examination after completed treatment for conditions other than malignant neoplasm: Secondary | ICD-10-CM | POA: Diagnosis not present

## 2019-11-06 DIAGNOSIS — E559 Vitamin D deficiency, unspecified: Secondary | ICD-10-CM | POA: Insufficient documentation

## 2019-11-06 DIAGNOSIS — E10641 Type 1 diabetes mellitus with hypoglycemia with coma: Secondary | ICD-10-CM | POA: Diagnosis not present

## 2019-11-06 DIAGNOSIS — Z114 Encounter for screening for human immunodeficiency virus [HIV]: Secondary | ICD-10-CM | POA: Diagnosis not present

## 2019-11-06 DIAGNOSIS — Z940 Kidney transplant status: Secondary | ICD-10-CM | POA: Insufficient documentation

## 2019-11-06 DIAGNOSIS — N390 Urinary tract infection, site not specified: Secondary | ICD-10-CM | POA: Diagnosis not present

## 2019-11-06 DIAGNOSIS — D631 Anemia in chronic kidney disease: Secondary | ICD-10-CM | POA: Diagnosis not present

## 2019-11-06 DIAGNOSIS — E10649 Type 1 diabetes mellitus with hypoglycemia without coma: Secondary | ICD-10-CM | POA: Diagnosis not present

## 2019-11-06 DIAGNOSIS — Z789 Other specified health status: Secondary | ICD-10-CM | POA: Diagnosis not present

## 2019-11-06 DIAGNOSIS — Z1159 Encounter for screening for other viral diseases: Secondary | ICD-10-CM | POA: Diagnosis not present

## 2019-11-06 DIAGNOSIS — Z9483 Pancreas transplant status: Secondary | ICD-10-CM | POA: Diagnosis not present

## 2019-11-06 DIAGNOSIS — E1129 Type 2 diabetes mellitus with other diabetic kidney complication: Secondary | ICD-10-CM | POA: Diagnosis not present

## 2019-11-06 DIAGNOSIS — Z79899 Other long term (current) drug therapy: Secondary | ICD-10-CM | POA: Diagnosis not present

## 2019-11-06 DIAGNOSIS — B259 Cytomegaloviral disease, unspecified: Secondary | ICD-10-CM | POA: Insufficient documentation

## 2019-11-06 DIAGNOSIS — E1021 Type 1 diabetes mellitus with diabetic nephropathy: Secondary | ICD-10-CM | POA: Diagnosis not present

## 2019-11-06 LAB — CBC WITH DIFFERENTIAL/PLATELET
Abs Immature Granulocytes: 0.01 10*3/uL (ref 0.00–0.07)
Basophils Absolute: 0.1 10*3/uL (ref 0.0–0.1)
Basophils Relative: 1 %
Eosinophils Absolute: 0.6 10*3/uL — ABNORMAL HIGH (ref 0.0–0.5)
Eosinophils Relative: 9 %
HCT: 30.9 % — ABNORMAL LOW (ref 39.0–52.0)
Hemoglobin: 9.8 g/dL — ABNORMAL LOW (ref 13.0–17.0)
Immature Granulocytes: 0 %
Lymphocytes Relative: 16 %
Lymphs Abs: 1.1 10*3/uL (ref 0.7–4.0)
MCH: 30.9 pg (ref 26.0–34.0)
MCHC: 31.7 g/dL (ref 30.0–36.0)
MCV: 97.5 fL (ref 80.0–100.0)
Monocytes Absolute: 0.5 10*3/uL (ref 0.1–1.0)
Monocytes Relative: 7 %
Neutro Abs: 4.6 10*3/uL (ref 1.7–7.7)
Neutrophils Relative %: 67 %
Platelets: 181 10*3/uL (ref 150–400)
RBC: 3.17 MIL/uL — ABNORMAL LOW (ref 4.22–5.81)
RDW: 12.9 % (ref 11.5–15.5)
WBC: 6.7 10*3/uL (ref 4.0–10.5)
nRBC: 0 % (ref 0.0–0.2)

## 2019-11-06 LAB — BASIC METABOLIC PANEL
Anion gap: 8 (ref 5–15)
BUN: 49 mg/dL — ABNORMAL HIGH (ref 8–23)
CO2: 23 mmol/L (ref 22–32)
Calcium: 9.5 mg/dL (ref 8.9–10.3)
Chloride: 107 mmol/L (ref 98–111)
Creatinine, Ser: 1.55 mg/dL — ABNORMAL HIGH (ref 0.61–1.24)
GFR calc Af Amer: 55 mL/min — ABNORMAL LOW (ref >60)
GFR calc non Af Amer: 48 mL/min — ABNORMAL LOW (ref >60)
Glucose, Bld: 169 mg/dL — ABNORMAL HIGH (ref 70–99)
Potassium: 5.1 mmol/L (ref 3.5–5.1)
Sodium: 138 mmol/L (ref 135–145)

## 2019-11-06 LAB — PHOSPHORUS: Phosphorus: 4 mg/dL (ref 2.5–4.6)

## 2019-11-06 LAB — MAGNESIUM: Magnesium: 1.7 mg/dL (ref 1.7–2.4)

## 2019-11-07 DIAGNOSIS — E103553 Type 1 diabetes mellitus with stable proliferative diabetic retinopathy, bilateral: Secondary | ICD-10-CM | POA: Diagnosis not present

## 2019-11-07 LAB — HM DIABETES EYE EXAM

## 2019-12-01 MED ORDER — LISINOPRIL 40 MG TABLET
ORAL_TABLET | 3 refills | 0 days | Status: CP
Start: 2019-12-01 — End: ?

## 2019-12-02 DIAGNOSIS — E1021 Type 1 diabetes mellitus with diabetic nephropathy: Secondary | ICD-10-CM | POA: Diagnosis not present

## 2019-12-02 DIAGNOSIS — E10641 Type 1 diabetes mellitus with hypoglycemia with coma: Secondary | ICD-10-CM | POA: Diagnosis not present

## 2019-12-02 DIAGNOSIS — E10649 Type 1 diabetes mellitus with hypoglycemia without coma: Secondary | ICD-10-CM | POA: Diagnosis not present

## 2019-12-14 ENCOUNTER — Other Ambulatory Visit: Payer: Self-pay

## 2019-12-14 ENCOUNTER — Other Ambulatory Visit
Admission: RE | Admit: 2019-12-14 | Discharge: 2019-12-14 | Disposition: A | Discharge status: Home / Self Care | Payer: BC Managed Care – PPO | Attending: Nephrology | Admitting: Nephrology

## 2019-12-14 ENCOUNTER — Encounter
Admit: 2019-12-14 | Discharge: 2019-12-14 | Payer: PRIVATE HEALTH INSURANCE | Attending: Nephrology | Primary: Nephrology

## 2019-12-14 DIAGNOSIS — D631 Anemia in chronic kidney disease: Secondary | ICD-10-CM | POA: Diagnosis not present

## 2019-12-14 DIAGNOSIS — Z940 Kidney transplant status: Secondary | ICD-10-CM | POA: Diagnosis not present

## 2019-12-14 DIAGNOSIS — N189 Chronic kidney disease, unspecified: Secondary | ICD-10-CM | POA: Diagnosis not present

## 2019-12-14 DIAGNOSIS — T8610 Unspecified complication of kidney transplant: Secondary | ICD-10-CM | POA: Diagnosis not present

## 2019-12-14 DIAGNOSIS — D899 Disorder involving the immune mechanism, unspecified: Secondary | ICD-10-CM | POA: Diagnosis present

## 2019-12-14 DIAGNOSIS — E1129 Type 2 diabetes mellitus with other diabetic kidney complication: Secondary | ICD-10-CM | POA: Diagnosis not present

## 2019-12-14 DIAGNOSIS — N390 Urinary tract infection, site not specified: Secondary | ICD-10-CM | POA: Insufficient documentation

## 2019-12-14 DIAGNOSIS — Z114 Encounter for screening for human immunodeficiency virus [HIV]: Secondary | ICD-10-CM | POA: Insufficient documentation

## 2019-12-14 DIAGNOSIS — Z79899 Other long term (current) drug therapy: Secondary | ICD-10-CM | POA: Insufficient documentation

## 2019-12-14 DIAGNOSIS — Z789 Other specified health status: Secondary | ICD-10-CM | POA: Insufficient documentation

## 2019-12-14 DIAGNOSIS — B259 Cytomegaloviral disease, unspecified: Secondary | ICD-10-CM | POA: Diagnosis not present

## 2019-12-14 DIAGNOSIS — Z9483 Pancreas transplant status: Secondary | ICD-10-CM | POA: Diagnosis not present

## 2019-12-14 DIAGNOSIS — E559 Vitamin D deficiency, unspecified: Secondary | ICD-10-CM | POA: Insufficient documentation

## 2019-12-14 LAB — BASIC METABOLIC PANEL
Anion gap: 6 (ref 5–15)
BUN: 40 mg/dL — ABNORMAL HIGH (ref 8–23)
CO2: 25 mmol/L (ref 22–32)
Calcium: 9.1 mg/dL (ref 8.9–10.3)
Chloride: 109 mmol/L (ref 98–111)
Creatinine, Ser: 1.28 mg/dL — ABNORMAL HIGH (ref 0.61–1.24)
GFR calc Af Amer: >60 mL/min (ref >60)
GFR calc non Af Amer: >60 mL/min (ref >60)
Glucose, Bld: 127 mg/dL — ABNORMAL HIGH (ref 70–99)
Potassium: 4.9 mmol/L (ref 3.5–5.1)
Sodium: 140 mmol/L (ref 135–145)

## 2019-12-14 LAB — CBC WITH DIFFERENTIAL/PLATELET
Abs Immature Granulocytes: 0.01 10*3/uL (ref 0.00–0.07)
Basophils Absolute: 0.1 10*3/uL (ref 0.0–0.1)
Basophils Relative: 1 %
Eosinophils Absolute: 0.8 10*3/uL — ABNORMAL HIGH (ref 0.0–0.5)
Eosinophils Relative: 14 %
HCT: 32 % — ABNORMAL LOW (ref 39.0–52.0)
Hemoglobin: 10 g/dL — ABNORMAL LOW (ref 13.0–17.0)
Immature Granulocytes: 0 %
Lymphocytes Relative: 19 %
Lymphs Abs: 1 10*3/uL (ref 0.7–4.0)
MCH: 30.6 pg (ref 26.0–34.0)
MCHC: 31.3 g/dL (ref 30.0–36.0)
MCV: 97.9 fL (ref 80.0–100.0)
Monocytes Absolute: 0.5 10*3/uL (ref 0.1–1.0)
Monocytes Relative: 9 %
Neutro Abs: 3 10*3/uL (ref 1.7–7.7)
Neutrophils Relative %: 57 %
Platelets: 174 10*3/uL (ref 150–400)
RBC: 3.27 MIL/uL — ABNORMAL LOW (ref 4.22–5.81)
RDW: 12.9 % (ref 11.5–15.5)
WBC: 5.3 10*3/uL (ref 4.0–10.5)
nRBC: 0 % (ref 0.0–0.2)

## 2019-12-14 LAB — MAGNESIUM: Magnesium: 1.7 mg/dL (ref 1.7–2.4)

## 2019-12-14 LAB — PHOSPHORUS: Phosphorus: 3.1 mg/dL (ref 2.5–4.6)

## 2019-12-18 DIAGNOSIS — Z940 Kidney transplant status: Principal | ICD-10-CM

## 2019-12-28 DIAGNOSIS — Z940 Kidney transplant status: Principal | ICD-10-CM

## 2019-12-28 MED ORDER — CINACALCET 60 MG TABLET
ORAL_TABLET | Freq: Every day | ORAL | 3 refills | 90 days | Status: CP
Start: 2019-12-28 — End: 2020-12-27
  Filled 2020-02-07: qty 30, 30d supply, fill #0

## 2019-12-29 DIAGNOSIS — Z940 Kidney transplant status: Principal | ICD-10-CM

## 2020-01-02 NOTE — Unmapped (Addendum)
1/29 update: per 1/15 referral denied, clinic was aware. Will reach out to clinic to see if pt should be disenrolled at this time    [facility] cinacalcet, however high copay with no additional assistance available.  Onboarding call set up for this week, reaching out to clinic first to see if we should contact patient.

## 2020-01-02 NOTE — Unmapped (Signed)
[facility] Specialty Medication Onboarding    Specialty Medication: CINACALCET 60MG  TABLETS  Prior Authorization: Not Required   Financial Assistance: No - patient doesn't qualify for additional assistance   Final Copay/Day Supply: $560 / 30 DAYS    Insurance Restrictions: None     Notes to Pharmacist:     The triage team has completed the benefits investigation and has determined that the patient is able to fill this medication at [facility]. Please contact the patient to complete the onboarding or follow up with the prescribing physician as needed.

## 2020-01-12 NOTE — Unmapped (Signed)
1/29: pt states he is receiving a shipment of this med from [facility] today. He would like a call back from [place] in a couple weeks. At that time he would like [place] to test claim and if we are cheaper than [facility] he will get [facility] going forward.-ef    [facility]   Patient Onboarding/Medication Counseling    Mr.Bartnik is a 64 y.o. male with kidney transplant who I am counseling today on continuation of therapy.  I am speaking to the patient.    Was a Nurse, learning disability used for this call? No    Verified patient's date of birth / HIPAA.    Specialty medication(s) to be sent: na      Non-specialty medications/supplies to be sent: na      Medications not needed at this time: na         Sensipar (cinacalcet)    Medication & Administration     Dosage: Take 1 tablet (60mg ) by mouth daily    Administration:   ? Take with food or shortly after a meal.  ? Take whole, do not chew, crush, or divide tablets    Adherence/Missed dose instructions:  ? Take a missed dose as soon as you think about it, with food.  ? If it is close to the time for your next dose, skip the missed dose and go back to your normal time.  ? Do not take 2 doses at the same time or extra doses.    Goals of Therapy     ? Normalize serum calcium levels    Side Effects & Monitoring Parameters     ? Common side effects  ? Feeling dizzy, tired, or weak  ? Nausea, vomiting, diarrhea, and stomach pain  ? Headache  ? Back pain, muscle spasm/pain  ? Cough  ? Signs of a common cold    ? The following side effects should be reported to the provider:  ? Allergic reaction  ? Signs of high blood pressure (very bad headaches or dizziness, passing out, or change in eyesight)  ? Low calcium levels (muscle cramps or spasms, numbness/tingling, or seizures)  ? Electrolyte issues (mood changes, confusion, muscle pain or weakness, abnormal heartbeat, lack of appetite, severe nausea/vomiting)  ? Dehydration (dry skin, dry mouth, dry eyes, increased thirst, fast heartbeat, dizziness, fast breathing, or confusion)  ? Heart issues (cough or shortness of breath, swelling in ankles or legs, abnormal heartbeat, weight gain > 5lbs in 24 hours, dizziness/passing out  ? Chest pain  ? Depression  ? Joint pain  ? Bone pain  ? Severe loss of strength and energy  ? Signs of stomach or bowel bleeding (black, tarry or bloddy stools, throwing up blood, throw up that looks like coffee grounds, very bad stomach pain, very upset stomach or throwing up)    ? Monitoring Parameters  ? Monitor serum calcium and phosphorus levels prior to starting and monthly   ? Monitor intact parathyroid hormone levels prior to starting and throughout therapy    Contraindications, Warnings, & Precautions     ? Adynamic bone disease - may develop if intact parathyroid hormone levels are suppressed <100pg/ml  ? Cardiovascular effects - QT prolongation and ventricular arrhythmia secondary to hypocalcemia may occur  ? GI effects - GI bleeding  ? Hypocalcemia    Drug/Food Interactions     ? Medication list reviewed in Epic. The patient was instructed to inform the care team before taking any  new medications or supplements. no interactions noted that clinic is not already monitoring.     Storage, Handling Precautions, & Disposal   ? Store at room temperature  ? Keep away from children and pets        Current Medications (including OTC/herbals), Comorbidities and Allergies     Current Outpatient Medications   Medication Sig Dispense Refill   ??? amLODIPine (NORVASC) 2.5 MG tablet Take 2 tablets (5 mg total) by mouth daily. 180 tablet 11   ??? aspirin (ECOTRIN) 81 MG tablet Take 81 mg by mouth daily.     ??? atorvastatin (LIPITOR) 20 MG tablet TAKE 1 TABLET NIGHTLY 90 tablet 4   ??? BD INSULIN SYRINGE ULT-FINE II 1/2 mL 31 x 5/16 Syrg      ??? brinzolamide-brimonidine (SIMBRINZA) 1-0.2 % DrpS Apply 1 drop to eye two (2) times a day. Right eye     ??? cinacalcet (SENSIPAR) 60 MG tablet Take 1 tablet (60 mg total) by mouth daily. 90 tablet 3   ??? ferrous sulfate (IRON, FERROUS SULFATE,) 325 (65 FE) MG tablet Take 325 mg by mouth daily with breakfast.      ??? furosemide (LASIX) 20 MG tablet Take 1 tablet (20 mg total) by mouth every other day. 45 tablet 3   ??? insulin ASPART (NOVOLOG) 100 unit/mL injection Inject 1-10 Units under the skin Three (3) times a day before meals.      ??? insulin glargine U-300 conc (TOUJEO MAX U-300 SOLOSTAR) 300 unit/mL (3 mL) InPn Inject 12 Units under the skin daily.      ??? lisinopriL (PRINIVIL,ZESTRIL) 40 MG tablet TAKE 1 TABLET DAILY 90 tablet 3   ??? mycophenolate (MYFORTIC) 360 MG TbEC TAKE 1 TABLET TWICE A DAY 180 tablet 3   ??? netarsudiL (RHOPRESSA) 0.02 % Drop Apply 1 drop to eye nightly. Right eye     ??? omeprazole (PRILOSEC) 20 MG capsule Take 20 mg by mouth every other day.     ??? ONE TOUCH DELICA LANCETS 30 gauge Misc      ??? ONE TOUCH ULTRA TEST Strp      ??? PROGRAF 1 mg capsule Take 3mg  in the AM and 2mg  in the PM 360 capsule 4   ??? VITAMIN D3 2,000 unit cap TAKE 1 CAPSULE DAILY AT 0600 90 capsule 4     No current facility-administered medications for this visit.        No Known Allergies    Patient Active Problem List   Diagnosis   ??? Personal history of venous thrombosis and embolism   ??? Kidney transplant 09/01/2011   ??? Hypertension   ??? Type 1 diabetes mellitus (CMS-HCC)   ??? Hyperlipidemia   ??? Basal cell carcinoma of back       Reviewed and up to date in Epic.    Appropriateness of Therapy     Is medication and dose appropriate based on diagnosis? Yes    Prescription has been clinically reviewed: Yes    Baseline Quality of Life Assessment      How many days over the past month did your transplant keep you from your normal activities? 0    Financial Information     Medication Assistance provided: None Required    Anticipated copay of $560.50 reviewed with patient. Verified delivery address. Pt is ok with this price on future fills.    Delivery Information     Scheduled delivery date: pt declined fill at this time - he said he  is getting a delivery of this med from [facility] today    Expected start date: pt is currently already taking    Medication will be delivered via na to the na address in [place].  This shipment will not require a signature.      Explained the services we provide at [facility] and that each month we would call to set up refills.  Stressed importance of returning phone calls so that we could ensure they receive their medications in time each month.  Informed patient that we should be setting up refills 7-10 days prior to when they will run out of medication.  A pharmacist will reach out to perform a clinical assessment periodically.  Informed patient that a welcome packet and a drug information handout will be sent.      Patient verbalized understanding of the above information as well as how to contact the pharmacy at [phone] option 4 with any questions/concerns.  The pharmacy is open Monday through Friday 8:30am-4:30pm.  A pharmacist is available 24/7 via pager to answer any clinical questions they may have.    Patient Specific Needs     ? Does the patient have any physical, cognitive, or cultural barriers? No    ? Patient prefers to have medications discussed with  Patient     ? Is the patient or caregiver able to read and understand education materials at a high school level or above? Yes    ? Patient's primary language is  English     ? Is the patient high risk? Yes, patient is taking a REMS drug. Medication is dispensed in compliance with REMS program.   But rems drug does not come from [facility]  ? Does the patient require a Care Management Plan? No     ? Does the patient require physician intervention or other additional services (i.e. nutrition, smoking cessation, social work)? No      Thad Ranger  [facility] Specialty Pharmacist

## 2020-01-15 NOTE — Unmapped (Signed)
left messages on both numbers, called to reschedule 3/5 appt to an In Person clinic day for Dr Carlene Coria

## 2020-01-23 MED ORDER — OMEPRAZOLE 20 MG CAPSULE,DELAYED RELEASE
ORAL_CAPSULE | 3 refills | 0 days | Status: CP
Start: 2020-01-23 — End: ?

## 2020-01-31 NOTE — Unmapped (Signed)
[facility] Clinical Assessment & Refill Coordination Note    Steven Cooley, DOB: 1956/10/25  Phone: [phone] (home)     All above HIPAA information was verified with patient.     Was a Nurse, learning disability used for this call? No    Specialty Medication(s):   Transplant: cinacalcet 60mg      Current Outpatient Medications   Medication Sig Dispense Refill   ??? amLODIPine (NORVASC) 2.5 MG tablet Take 2 tablets (5 mg total) by mouth daily. 180 tablet 11   ??? aspirin (ECOTRIN) 81 MG tablet Take 81 mg by mouth daily.     ??? atorvastatin (LIPITOR) 20 MG tablet TAKE 1 TABLET NIGHTLY 90 tablet 4   ??? BD INSULIN SYRINGE ULT-FINE II 1/2 mL 31 x 5/16 Syrg      ??? brinzolamide-brimonidine (SIMBRINZA) 1-0.2 % DrpS Apply 1 drop to eye two (2) times a day. Right eye     ??? cinacalcet (SENSIPAR) 60 MG tablet Take 1 tablet (60 mg total) by mouth daily. 90 tablet 3   ??? ferrous sulfate (IRON, FERROUS SULFATE,) 325 (65 FE) MG tablet Take 325 mg by mouth daily with breakfast.      ??? furosemide (LASIX) 20 MG tablet Take 1 tablet (20 mg total) by mouth every other day. 45 tablet 3   ??? insulin ASPART (NOVOLOG) 100 unit/mL injection Inject 1-10 Units under the skin Three (3) times a day before meals.      ??? insulin glargine U-300 conc (TOUJEO MAX U-300 SOLOSTAR) 300 unit/mL (3 mL) InPn Inject 12 Units under the skin daily.      ??? lisinopriL (PRINIVIL,ZESTRIL) 40 MG tablet TAKE 1 TABLET DAILY 90 tablet 3   ??? mycophenolate (MYFORTIC) 360 MG TbEC TAKE 1 TABLET TWICE A DAY 180 tablet 3   ??? netarsudiL (RHOPRESSA) 0.02 % Drop Apply 1 drop to eye nightly. Right eye     ??? omeprazole (PRILOSEC) 20 MG capsule TAKE 1 CAPSULE DAILY AS NEEDED 90 capsule 3   ??? ONE TOUCH DELICA LANCETS 30 gauge Misc      ??? ONE TOUCH ULTRA TEST Strp      ??? PROGRAF 1 mg capsule Take 3mg  in the AM and 2mg  in the PM 360 capsule 4   ??? VITAMIN D3 2,000 unit cap TAKE 1 CAPSULE DAILY AT 0600 90 capsule 4     No current facility-administered medications for this visit.         Changes to medications: Steven Cooley reports no changes at this time.    No Known Allergies    Changes to allergies: No    SPECIALTY MEDICATION ADHERENCE     cinacalcet 60mg   : 14 days of medicine on hand     Medication Adherence    Patient reported X missed doses in the last month: 0  Specialty Medication: cinacalcet 60mg           Specialty medication(s) dose(s) confirmed: Regimen is correct and unchanged.     Are there any concerns with adherence? No    Adherence counseling provided? Not needed    CLINICAL MANAGEMENT AND INTERVENTION      Clinical Benefit Assessment:    Do you feel the medicine is effective or helping your condition? Yes    Clinical Benefit counseling provided? Not needed    Adverse Effects Assessment:    Are you experiencing any side effects? No    Are you experiencing difficulty administering your medicine? No    Quality of  Life Assessment:    How many days over the past month did your transplant  keep you from your normal activities? For example, brushing your teeth or getting up in the morning. 0    Have you discussed this with your provider? Not needed    Therapy Appropriateness:    Is therapy appropriate? Yes, therapy is appropriate and should be continued    DISEASE/MEDICATION-SPECIFIC INFORMATION      N/A    PATIENT SPECIFIC NEEDS     ? Does the patient have any physical, cognitive, or cultural barriers? No    Is the patient high risk? Yes, patient is taking a REMS drug. Medication is dispensed in compliance with REMS program. however rems med does not come from [facility]  ? Does the patient require a Care Management Plan? No     ? Does the patient require physician intervention or other additional services (i.e. nutrition, smoking cessation, social work)? No      SHIPPING     Specialty Medication(s) to be Shipped:   na    Other medication(s) to be shipped: na     Changes to insurance: No    Delivery Scheduled: Patient declined refill at this time due to is still working with insurance, would like [place] to call back in 1 week.     Medication will be delivered via na to the confirmed na address in [facility].    The patient will receive a drug information handout for each medication shipped and additional FDA Medication Guides as required.  Verified that patient has previously received a Conservation officer, historic buildings.    All of the patient's questions and concerns have been addressed.    Thad Ranger   [facility] Specialty Pharmacist

## 2020-02-07 MED FILL — CINACALCET 60 MG TABLET: 30 days supply | Qty: 30 | Fill #0 | Status: AC

## 2020-02-07 NOTE — Unmapped (Signed)
[facility] Refill Coordination Note    Specialty Medication(s) to be Shipped:   Transplant: Cinacalcet    Other medication(s) to be shipped: N/A     Steven Cooley, DOB: 03-04-1956  Phone: [phone] (home)       All above HIPAA information was verified with patient.     Was a Nurse, learning disability used for this call? No    Completed refill call assessment today to schedule patient's medication shipment from the [facility] [phone]).       Specialty medication(s) and dose(s) confirmed: Regimen is correct and unchanged.   Changes to medications: Steven Cooley reports no changes at this time.  Changes to insurance: No  Questions for the pharmacist: No    Confirmed patient received Welcome Packet with first shipment. The patient will receive a drug information handout for each medication shipped and additional FDA Medication Guides as required.       DISEASE/MEDICATION-SPECIFIC INFORMATION        N/A    SPECIALTY MEDICATION ADHERENCE     Medication Adherence    Patient reported X missed doses in the last month: 0  Specialty Medication: Cinacalcet  Patient is on additional specialty medications: No                Cinacalcet 60 mg: 0 days of medicine on hand         SHIPPING     Shipping address confirmed in Epic.     Delivery Scheduled: Yes, Expected medication delivery date: 02/08/20.     Medication will be delivered via UPS to the prescription address in Epic [place].    Nancy Nordmann [facility] Specialty Technician

## 2020-02-27 DIAGNOSIS — Z940 Kidney transplant status: Principal | ICD-10-CM

## 2020-02-27 DIAGNOSIS — Z79899 Other long term (current) drug therapy: Principal | ICD-10-CM

## 2020-02-27 DIAGNOSIS — Z1159 Encounter for screening for other viral diseases: Principal | ICD-10-CM

## 2020-02-28 ENCOUNTER — Encounter: Admit: 2020-02-28 | Discharge: 2020-02-29 | Payer: PRIVATE HEALTH INSURANCE

## 2020-02-28 ENCOUNTER — Encounter
Admit: 2020-02-28 | Discharge: 2020-02-29 | Payer: PRIVATE HEALTH INSURANCE | Attending: Nephrology | Primary: Nephrology

## 2020-02-28 DIAGNOSIS — D899 Disorder involving the immune mechanism, unspecified: Principal | ICD-10-CM

## 2020-02-28 DIAGNOSIS — Z79899 Other long term (current) drug therapy: Principal | ICD-10-CM

## 2020-02-28 DIAGNOSIS — Z1159 Encounter for screening for other viral diseases: Principal | ICD-10-CM

## 2020-02-28 DIAGNOSIS — Z940 Kidney transplant status: Principal | ICD-10-CM

## 2020-02-28 LAB — CBC W/ AUTO DIFF
BASOPHILS RELATIVE PERCENT: 1.2 %
EOSINOPHILS ABSOLUTE COUNT: 1.2 10*9/L — ABNORMAL HIGH (ref 0.0–0.7)
EOSINOPHILS RELATIVE PERCENT: 17.6 %
HEMATOCRIT: 31.5 % — ABNORMAL LOW (ref 38.0–50.0)
HEMOGLOBIN: 10.1 g/dL — ABNORMAL LOW (ref 13.5–17.5)
LYMPHOCYTES ABSOLUTE COUNT: 1.1 10*9/L (ref 0.7–4.0)
LYMPHOCYTES RELATIVE PERCENT: 15.9 %
MEAN CORPUSCULAR HEMOGLOBIN CONC: 32 g/dL (ref 30.0–36.0)
MEAN CORPUSCULAR HEMOGLOBIN: 30.5 pg (ref 26.0–34.0)
MEAN CORPUSCULAR VOLUME: 95.3 fL — ABNORMAL HIGH (ref 81.0–95.0)
MEAN PLATELET VOLUME: 7.5 fL (ref 7.0–10.0)
MONOCYTES ABSOLUTE COUNT: 0.7 10*9/L (ref 0.1–1.0)
MONOCYTES RELATIVE PERCENT: 9.8 %
NEUTROPHILS ABSOLUTE COUNT: 3.8 10*9/L (ref 1.7–7.7)
NEUTROPHILS RELATIVE PERCENT: 55.5 %
RED BLOOD CELL COUNT: 3.31 10*12/L — ABNORMAL LOW (ref 4.32–5.72)
RED CELL DISTRIBUTION WIDTH: 13.4 % (ref 12.0–15.0)

## 2020-02-28 LAB — COMPREHENSIVE METABOLIC PANEL
ALBUMIN: 3.7 g/dL (ref 3.4–5.0)
ALKALINE PHOSPHATASE: 54 U/L (ref 46–116)
ALT (SGPT): 18 U/L (ref 10–49)
ANION GAP: 3 mmol/L (ref 3–11)
AST (SGOT): 18 U/L (ref <34)
BILIRUBIN TOTAL: 0.5 mg/dL (ref 0.3–1.2)
BLOOD UREA NITROGEN: 53 mg/dL — ABNORMAL HIGH (ref 9–23)
BUN / CREAT RATIO: 36
CALCIUM: 9.4 mg/dL (ref 8.7–10.4)
CO2: 27 mmol/L (ref 20.0–31.0)
CREATININE: 1.46 mg/dL — ABNORMAL HIGH (ref 0.60–1.10)
EGFR CKD-EPI AA MALE: 59 mL/min/{1.73_m2}
EGFR CKD-EPI NON-AA MALE: 51 mL/min/{1.73_m2}
GLUCOSE RANDOM: 81 mg/dL (ref 70–179)
POTASSIUM: 4.9 mmol/L (ref 3.5–5.1)
PROTEIN TOTAL: 6.1 g/dL (ref 5.7–8.2)

## 2020-02-28 LAB — URINALYSIS
BILIRUBIN UA: NEGATIVE
KETONES UA: NEGATIVE
LEUKOCYTE ESTERASE UA: NEGATIVE
NITRITE UA: NEGATIVE
PH UA: 5 (ref 5.0–9.0)
PROTEIN UA: NEGATIVE
RBC UA: 4 /HPF — ABNORMAL HIGH (ref <3)
SPECIFIC GRAVITY UA: 1.02 (ref 1.005–1.030)
SQUAMOUS EPITHELIAL: 7 /HPF — ABNORMAL HIGH (ref 0–5)
UROBILINOGEN UA: 0.2
WBC UA: 1 /HPF (ref <2)

## 2020-02-28 LAB — VITAMIN D, TOTAL (25OH): Lab: 32.7

## 2020-02-28 LAB — MAGNESIUM: Magnesium:MCnc:Pt:Ser/Plas:Qn:: 1.8

## 2020-02-28 LAB — PHOSPHORUS: Phosphate:MCnc:Pt:Ser/Plas:Qn:: 5.3 — ABNORMAL HIGH

## 2020-02-28 LAB — POTASSIUM: Potassium:SCnc:Pt:Ser/Plas:Qn:: 4.9

## 2020-02-28 LAB — MEAN CORPUSCULAR HEMOGLOBIN CONC: Erythrocyte mean corpuscular hemoglobin concentration:MCnc:Pt:RBC:Qn:Automated count: 32

## 2020-02-28 LAB — CREATININE, URINE: Lab: 83

## 2020-02-28 LAB — RBC UA: Erythrocytes:Naric:Pt:Urine sed:Qn:Microscopy.light.HPF: 4 — ABNORMAL HIGH

## 2020-02-28 LAB — PROTEIN / CREATININE RATIO, URINE: CREATININE, URINE: 83 mg/dL

## 2020-02-28 LAB — TACROLIMUS, TROUGH: Lab: 8.3

## 2020-02-28 NOTE — Unmapped (Signed)
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 He presents today for a routine follow up. His post transplant course has been complicated by a DVT and has right lower extremity. he was treated with Coumadin for a duration of time after the clot, but has been off that for quite some time. Patient does not have evidence of donor specific antibodies. His creatinine had trended up from 1.2-1.4 to 1.6-1.8 following initiation of lisinopril and titrating dose upwards, though more recently has been back to 1.2-1.5.    Interval history since last seen 08/23/19    At his last visit, he had concerns about weight loss. He had some unexpected time out from work and some stressful events in his life (his father passed away and his mother is in a nursing home). To cope with this, he started walking and he was walking 3-4 miles daily, sometimes up to 6-7 miles. He had not really increased his eating to account for the additional calorie burn. He had lost about 5 pounds from his previous visit.    He saw endocrinology 09/15/19 and was noted to have an A1c of 6.1, no change to his regimen.    He presents today for follow up. He has not had labs in the system since 12/14/19.  He saw dermatology recently and the day after got a rash on his back.  Using cream and taking benadryl at night for itching.  He is working less hours at work right now. He is drinking about 5-6 bottles of water.  He continues to have edema in BLE.  He is taking Lasix every other day. He received the first Covid vaccine 02/24/20.     He specifically denies fever, myalgias, upper respiratory/GI symptoms, or change in taste/smell.    Last dose of Prograf: 9 pm.    Review of Systems    Otherwise on review of systems patient denies fever or chills, chest pain, SOB, PND or orthopnea.  Denies N/V/abdominal pain. No dysuria, hematuria or difficulty voiding. Bowel movements normal.  Denies joint pain or rash. All other systems are reviewed and are negative.    Medications    Current Outpatient Medications   Medication Sig Dispense Refill   ??? amLODIPine (NORVASC) 2.5 MG tablet Take 2 tablets (5 mg total) by mouth daily. 180 tablet 11   ??? aspirin (ECOTRIN) 81 MG tablet Take 81 mg by mouth daily.     ??? atorvastatin (LIPITOR) 20 MG tablet TAKE 1 TABLET NIGHTLY 90 tablet 4   ??? cinacalcet (SENSIPAR) 60 MG tablet Take 1 tablet (60 mg total) by mouth daily. 90 tablet 3   ??? ferrous sulfate (IRON, FERROUS SULFATE,) 325 (65 FE) MG tablet Take 325 mg by mouth daily with breakfast.      ??? furosemide (LASIX) 20 MG tablet Take 1 tablet (20 mg total) by mouth every other day. 45 tablet 3   ??? insulin ASPART (NOVOLOG) 100 unit/mL injection Inject 1-10 Units under the skin Three (3) times a day before meals.      ??? insulin glargine U-300 conc (TOUJEO MAX U-300 SOLOSTAR) 300 unit/mL (3 mL) InPn Inject 12 Units under the skin daily.      ??? lisinopriL (PRINIVIL,ZESTRIL) 40 MG tablet TAKE 1 TABLET DAILY 90 tablet 3   ??? mycophenolate (MYFORTIC) 360 MG TbEC TAKE 1 TABLET TWICE  A DAY 180 tablet 3   ??? omeprazole (PRILOSEC) 20 MG capsule TAKE 1 CAPSULE DAILY AS NEEDED 90 capsule 3   ??? PROGRAF 1 mg capsule Take 3mg  in the AM and 2mg  in the PM 360 capsule 4   ??? VITAMIN D3 2,000 unit cap TAKE 1 CAPSULE DAILY AT 0600 90 capsule 4   ??? BD INSULIN SYRINGE ULT-FINE II 1/2 mL 31 x 5/16 Syrg      ??? brinzolamide-brimonidine (SIMBRINZA) 1-0.2 % DrpS Apply 1 drop to eye two (2) times a day. Right eye     ??? netarsudiL (RHOPRESSA) 0.02 % Drop Apply 1 drop to eye nightly. Right eye     ??? ONE TOUCH DELICA LANCETS 30 gauge Misc      ??? ONE TOUCH ULTRA TEST Strp      ??? triamcinolone (KENALOG) 0.1 % cream        No current facility-administered medications for this visit.        Physical Exam    BP 145/72 (BP Site: L Arm, BP Position: Sitting, BP Cuff Size: Small)  - Pulse 62  - Temp 36.2 ??C (97.2 ??F) (Temporal)  - Ht 177.8 cm (5' 10)  - Wt 66.5 kg (146 lb 9.6 oz)  - BMI 21.03 kg/m??   General: Patient is a pleasant male in no apparent distress.  Eyes: Sclera anicteric.  ENT: Oropharynx without lesions.   Neck: Supple without LAD/JVD/bruits.  Lungs: Clear to auscultation bilaterally, no wheezes/rales/rhonchi.  Cardiovascular: Regular rate and rhythm without murmurs, rubs or gallops.  Abdomen: Soft, notender/nondistended. Positive bowel sounds. No tenderness over the graft.  Extremities: RLE with compression sock in place; LLE +2 pitting edema of ankle  Skin: Without rash.  Neurological: Grossly nonfocal.  Psychiatric: Mood and affect appropriate.        Laboratory Results    Recent Results (from the past 170 hour(s))   Phosphorus Level    Collection Time: 02/28/20 10:11 AM   Result Value Ref Range    Phosphorus 5.3 (H) 2.4 - 5.1 mg/dL   Magnesium Level    Collection Time: 02/28/20 10:11 AM   Result Value Ref Range    Magnesium 1.8 1.6 - 2.6 mg/dL   Protein/Creatinine Ratio, Urine    Collection Time: 02/28/20 10:11 AM   Result Value Ref Range    Creat U 83.0 Undefined mg/dL    Protein, Ur 7.0 Undefined mg/dL    Protein/Creatinine Ratio, Urine 0.084 Undefined   Comprehensive Metabolic Panel    Collection Time: 02/28/20 10:11 AM   Result Value Ref Range    Sodium 143 136 - 145 mmol/L    Potassium 4.9 3.5 - 5.1 mmol/L    Chloride 113 (H) 98 - 107 mmol/L    Anion Gap 3 3 - 11 mmol/L    CO2 27.0 20.0 - 31.0 mmol/L    BUN 53 (H) 9 - 23 mg/dL    Creatinine 1.91 (H) 0.60 - 1.10 mg/dL    BUN/Creatinine Ratio 36     EGFR CKD-EPI Non-African American, Male 51 mL/min/1.30m2    EGFR CKD-EPI African American, Male 59 mL/min/1.35m2    Glucose 81 70 - 179 mg/dL    Calcium 9.4 8.7 - 47.8 mg/dL    Albumin 3.7 3.4 - 5.0 g/dL    Total Protein 6.1 5.7 - 8.2 g/dL    Total Bilirubin 0.5 0.3 - 1.2 mg/dL    AST 18 <29 U/L    ALT 18 10 - 49 U/L  Alkaline Phosphatase 54 46 - 116 U/L   Urinalysis    Collection Time: 02/28/20 10:11 AM   Result Value Ref Range    Color, UA Yellow     Clarity, UA Clear     Specific Gravity, UA 1.020 1.005 - 1.030    pH, UA 5.0 5.0 - 9.0    Leukocyte Esterase, UA Negative Negative    Nitrite, UA Negative Negative    Protein, UA Negative Negative    Glucose, UA Negative Negative    Ketones, UA Negative Negative    Urobilinogen, UA 0.2 mg/dL 0.2 - 2.0 mg/dL    Bilirubin, UA Negative Negative    Blood, UA Negative Negative    RBC, UA 4 (H) <3 /HPF    WBC, UA 1 <2 /HPF    Squam Epithel, UA 7 (H) 0 - 5 /HPF    Bacteria, UA Rare (A) None Seen /HPF   Tacrolimus Level, Trough    Collection Time: 02/28/20 10:11 AM   Result Value Ref Range    Tacrolimus, Trough 8.3 5.0 - 15.0 ng/mL   Hemoglobin A1c    Collection Time: 02/28/20 10:11 AM   Result Value Ref Range    Hemoglobin A1C 6.2 (H) 4.8 - 5.6 %    Estimated Average Glucose 131 mg/dL   Vitamin D 25 Hydroxy (25OH D2 + D3)    Collection Time: 02/28/20 10:11 AM   Result Value Ref Range    Vitamin D Total (25OH) 32.7 20.0 - 80.0 ng/mL   Vitamin D 1,25 dihydroxy    Collection Time: 02/28/20 10:11 AM   Result Value Ref Range    Vit D, 1,25-Dihydroxy 18 18 - 64 pg/mL   CBC w/ Differential    Collection Time: 02/28/20 10:11 AM   Result Value Ref Range    WBC 6.8 3.5 - 10.5 10*9/L    RBC 3.31 (L) 4.32 - 5.72 10*12/L    HGB 10.1 (L) 13.5 - 17.5 g/dL    HCT 02.7 (L) 25.3 - 50.0 %    MCV 95.3 (H) 81.0 - 95.0 fL    MCH 30.5 26.0 - 34.0 pg    MCHC 32.0 30.0 - 36.0 g/dL    RDW 66.4 40.3 - 47.4 %    MPV 7.5 7.0 - 10.0 fL    Platelet 192 150 - 450 10*9/L    Neutrophils % 55.5 %    Lymphocytes % 15.9 %    Monocytes % 9.8 %    Eosinophils % 17.6 %    Basophils % 1.2 %    Absolute Neutrophils 3.8 1.7 - 7.7 10*9/L    Absolute Lymphocytes 1.1 0.7 - 4.0 10*9/L    Absolute Monocytes 0.7 0.1 - 1.0 10*9/L    Absolute Eosinophils 1.2 (H) 0.0 - 0.7 10*9/L    Absolute Basophils 0.1 0.0 - 0.1 10*9/L         Assessment and Plan    1. Status post renal transplant. Creatinine of 1.46 today is at baseline. Tac trough 8.3 today and within goal of 6-8. Will continue current immunosuppression at this time and will continue to monitor.     2. Weight loss. Suspect secondary to increased calorie expenditure. Now that he is back at work he has gained some weight back. Only lost 1 pound since last visit.  Appetite is good.     3. Hypertension. Blood pressure at goal on current regimen of lisinopril and amlodipine.     4. Type 1 diabetes. He follows  with local endocrinology.     5. Skin cancer. He is followed closely by local dermatology. Will follow up with them if rash also doesn't get better.     6. Bilateral lower extremity edema. Improved when off work and resting at home. Continue Furosemide every other day and compression stockings.     7. Hyperlipidemia. Continue atorvastatin. The 10-year ASCVD risk score Denman George DC Jr., et al., 2013) is: 11.6%    8. Hypercalcemia. Calcium normal at 9.4 on Sensipar.     9. Anemia. Hemoglobin stable, iron panel normal on oral iron. He is up-to-date on his colonoscopy.     10. Health maintenance. He received a flu shot 08/23/2019.  He received pneumovax 13 on 09/2014 and Prevnar 23 on 01/2015. He has received two doses of Shingrix, first here on 09/08/17, second locally.  Moderna Covid-19 vaccine dose #1 02/24/20, with second dose scheduled.     We discussed the importance of social distancing, wearing a mask outside the home, and hand washing/sanitizing. We discussed that this is particularly important for transplant patients who are at risk for complications from coronavirus.     11. Will see patient back in 6 months, or sooner if needed.

## 2020-02-28 NOTE — Unmapped (Signed)
AOBP:Left arm  small cuff   Average:145/72 Pulse:62  1st reading:146/74 Pulse:62  2nd reading:147/72 Pulse:62  3rd reading:143/71 Pulse:62    Per pt urine specimen collected at time of blood draw in the lab.

## 2020-02-28 NOTE — Unmapped (Signed)
Assessment    Met w/ patient in [facility] today. Reviewed meds/symptoms. Any new medications? no                Pt reports no fever/cold/flu symptoms    BP: 145/72 today/ Home BP reported WNL    BG: Dexcom helping to keep control   Headache/Dizziness/Lightheaded   Hand tremors:   Numbness/tingling:    Fevers/chills/sweats:   CP/SOB/palpatations   Nausea/vomiting/heartburn   Diarrhea/constipation   UTI symptoms (burn/pain/itch/frequency/urgency/odor/color/foam):   Moderate visible or palpable edema - wearing compression socks at work. 10hr+ days at work, edema is worse    Appetite good; reports adequate hydration - 5-6bottles/day    Pt reports being well rested and getting adequate exercise despite Covid 19 quarantine. Taking care to mask, hand hygeine and minimal public activity. 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 Offered support and guidance for this process given his immune suppressed state. 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    Last tac taken 2100; held for this morning's labs.    No other complaints or concerns.     Referrals needed: Derm - goes q84mos    Immunization status: 3/13 1st Moderna, 2nd due 4/10      Functional Score: 100     Employment status: works full time

## 2020-02-29 LAB — HLA DS POST TRANSPLANT
ANTI-DONOR HLA-B #1 MFI: 0 MFI
ANTI-DONOR HLA-B #2 MFI: 0 MFI
ANTI-DONOR HLA-C #1 MFI: 0 MFI
ANTI-DONOR HLA-C #2 MFI: 0 MFI
ANTI-DONOR HLA-DQB #1 MFI: 0 MFI
ANTI-DONOR HLA-DQB #2 MFI: 0 MFI
ANTI-DONOR HLA-DR #1 MFI: 0 MFI
ANTI-DONOR HLA-DR #2 MFI: 0 MFI

## 2020-02-29 LAB — HEMOGLOBIN A1C
ESTIMATED AVERAGE GLUCOSE: 131 mg/dL
Hemoglobin A1c/Hemoglobin.total:MFr:Pt:Bld:Qn:: 6.2 — ABNORMAL HIGH

## 2020-02-29 LAB — DONOR HLA-B ANTIGEN #1

## 2020-03-05 LAB — VITAMIN D 1,25-DIHYDROXY: 1,25-Dihydroxyvitamin D:MCnc:Pt:Ser/Plas:Qn:: 18

## 2020-03-07 LAB — HLA DS POST TRANSPLANT
ANTI-DONOR DRW #1 MFI: 0 MFI
ANTI-DONOR HLA-A #1 MFI: 0 MFI
ANTI-DONOR HLA-B #2 MFI: 0 MFI
ANTI-DONOR HLA-C #1 MFI: 0 MFI
ANTI-DONOR HLA-C #2 MFI: 0 MFI
ANTI-DONOR HLA-DQB #1 MFI: 0 MFI
ANTI-DONOR HLA-DQB #2 MFI: 0 MFI
ANTI-DONOR HLA-DR #1 MFI: 0 MFI
ANTI-DONOR HLA-DR #2 MFI: 0 MFI

## 2020-03-07 LAB — HLA CL2 AB RESULT: Lab: NEGATIVE

## 2020-03-07 LAB — FSAB CLASS 2 ANTIBODY SPECIFICITY: HLA CL2 AB RESULT: NEGATIVE

## 2020-03-07 LAB — DONOR DRW ANTIGEN #1

## 2020-03-07 LAB — HLA CLASS 1 ANTIBODY RESULT: Lab: NEGATIVE

## 2020-03-08 MED FILL — CINACALCET 60 MG TABLET: ORAL | 30 days supply | Qty: 30 | Fill #1

## 2020-03-08 MED FILL — CINACALCET 60 MG TABLET: 30 days supply | Qty: 30 | Fill #1 | Status: AC

## 2020-03-08 NOTE — Unmapped (Signed)
[facility] Refill Coordination Note    Specialty Medication(s) to be Shipped:   Transplant: cinacalet 60mg     Other medication(s) to be shipped: n/a     Steven Cooley, DOB: 02-11-1956  Phone: [phone] (home)       All above HIPAA information was verified with patient.     Was a Nurse, learning disability used for this call? No    Completed refill call assessment today to schedule patient's medication shipment from the [facility] [phone]).       Specialty medication(s) and dose(s) confirmed: Regimen is correct and unchanged.   Changes to medications: Khary reports no changes at this time.  Changes to insurance: No  Questions for the pharmacist: No    Confirmed patient received Welcome Packet with first shipment. The patient will receive a drug information handout for each medication shipped and additional FDA Medication Guides as required.       DISEASE/MEDICATION-SPECIFIC INFORMATION        N/A    SPECIALTY MEDICATION ADHERENCE     Medication Adherence    Patient reported X missed doses in the last month: 0  Specialty Medication: cinacalcet 60mg                 cinacalet 60mg   : 5 days of medicine on hand         SHIPPING     Shipping address confirmed in Epic.     Delivery Scheduled: Yes, Expected medication delivery date: 3/29.     Medication will be delivered via UPS to the prescription address in Epic [place].    Westley Gambles   [facility] Specialty Technician

## 2020-04-01 NOTE — Unmapped (Signed)
[facility] Refill Coordination Note    Specialty Medication(s) to be Shipped:   Transplant: Cinacalcet 60mg     Other medication(s) to be shipped: N/A     Steven Cooley, DOB: 01/29/1956  Phone: [phone] (home)       All above HIPAA information was verified with patient.     Was a Nurse, learning disability used for this call? No    Completed refill call assessment today to schedule patient's medication shipment from the [facility] [phone]).       Specialty medication(s) and dose(s) confirmed: Regimen is correct and unchanged.   Changes to medications: Steven Cooley reports no changes at this time.  Changes to insurance: No  Questions for the pharmacist: No    Confirmed patient received Welcome Packet with first shipment. The patient will receive a drug information handout for each medication shipped and additional FDA Medication Guides as required.       DISEASE/MEDICATION-SPECIFIC INFORMATION        N/A    SPECIALTY MEDICATION ADHERENCE     Medication Adherence    Patient reported X missed doses in the last month: 0  Specialty Medication: Cinacalcet 60mg   Patient is on additional specialty medications: No          Cinacalcet 60 mg: 9 days of medicine on hand     SHIPPING     Shipping address confirmed in Epic.     Delivery Scheduled: Yes, Expected medication delivery date: 04/08/2020.     Medication will be delivered via UPS to the prescription address in Epic WAM.    Steven Cooley [facility] Specialty Technician

## 2020-04-05 MED ORDER — AMLODIPINE 2.5 MG TABLET
ORAL_TABLET | 3 refills | 0 days | Status: CP
Start: 2020-04-05 — End: ?

## 2020-04-05 MED FILL — CINACALCET 60 MG TABLET: ORAL | 30 days supply | Qty: 30 | Fill #2

## 2020-04-05 MED FILL — CINACALCET 60 MG TABLET: 30 days supply | Qty: 30 | Fill #2 | Status: AC

## 2020-04-05 NOTE — Unmapped (Signed)
Patient has requested a medication refill via EPIC

## 2020-04-28 DIAGNOSIS — Z940 Kidney transplant status: Principal | ICD-10-CM

## 2020-04-28 MED ORDER — ATORVASTATIN 20 MG TABLET
ORAL_TABLET | 3 refills | 0 days
Start: 2020-04-28 — End: ?

## 2020-04-29 NOTE — Unmapped (Signed)
Patient has requested a medication refill via EPIC

## 2020-05-10 NOTE — Unmapped (Signed)
[facility] Refill Coordination Note    Specialty Medication(s) to be Shipped:   Transplant: Cinacalcet 60mg     Other medication(s) to be shipped: n/a     Steven Cooley, DOB: 1956-02-10  Phone: [phone] (home)       All above HIPAA information was verified with patient.     Was a Nurse, learning disability used for this call? No    Completed refill call assessment today to schedule patient's medication shipment from the [facility] [phone]).       Specialty medication(s) and dose(s) confirmed: Regimen is correct and unchanged.   Changes to medications: Steven Cooley reports no changes at this time.  Changes to insurance: No  Questions for the pharmacist: No    Confirmed patient received Welcome Packet with first shipment. The patient will receive a drug information handout for each medication shipped and additional FDA Medication Guides as required.       DISEASE/MEDICATION-SPECIFIC INFORMATION        N/A    SPECIALTY MEDICATION ADHERENCE     Medication Adherence    Patient reported X missed doses in the last month: 0  Specialty Medication: Cinacalcet 60mg   Patient is on additional specialty medications: No  Informant: patient                Cincalcet 60 mg: 7 days of medicine on hand         SHIPPING     Shipping address confirmed in Epic.     Delivery Scheduled: Yes, Expected medication delivery date: 05/15/20.     Medication will be delivered via Next Day Courier to the prescription address in Epic [place].    Steven Cooley   [facility] Specialty Technician

## 2020-05-11 DIAGNOSIS — Z940 Kidney transplant status: Principal | ICD-10-CM

## 2020-05-11 MED ORDER — PROGRAF 1 MG CAPSULE
ORAL_CAPSULE | 4 refills | 0 days | Status: CP
Start: 2020-05-11 — End: ?

## 2020-05-14 MED FILL — CINACALCET 60 MG TABLET: 30 days supply | Qty: 30 | Fill #3 | Status: AC

## 2020-05-14 MED FILL — CINACALCET 60 MG TABLET: ORAL | 30 days supply | Qty: 30 | Fill #3

## 2020-05-27 ENCOUNTER — Encounter
Admit: 2020-05-27 | Discharge: 2020-05-27 | Payer: PRIVATE HEALTH INSURANCE | Attending: Nephrology | Primary: Nephrology

## 2020-05-29 ENCOUNTER — Other Ambulatory Visit
Admission: RE | Admit: 2020-05-29 | Discharge: 2020-05-29 | Disposition: A | Discharge status: Home / Self Care | Payer: BC Managed Care – PPO | Attending: Nephrology | Admitting: Nephrology

## 2020-05-29 ENCOUNTER — Other Ambulatory Visit: Payer: Self-pay

## 2020-05-29 DIAGNOSIS — Z789 Other specified health status: Secondary | ICD-10-CM | POA: Diagnosis not present

## 2020-05-29 DIAGNOSIS — N189 Chronic kidney disease, unspecified: Secondary | ICD-10-CM | POA: Insufficient documentation

## 2020-05-29 DIAGNOSIS — E1122 Type 2 diabetes mellitus with diabetic chronic kidney disease: Secondary | ICD-10-CM | POA: Diagnosis not present

## 2020-05-29 DIAGNOSIS — B259 Cytomegaloviral disease, unspecified: Secondary | ICD-10-CM | POA: Insufficient documentation

## 2020-05-29 DIAGNOSIS — E559 Vitamin D deficiency, unspecified: Secondary | ICD-10-CM | POA: Insufficient documentation

## 2020-05-29 DIAGNOSIS — Z9483 Pancreas transplant status: Secondary | ICD-10-CM | POA: Diagnosis not present

## 2020-05-29 DIAGNOSIS — Z79899 Other long term (current) drug therapy: Secondary | ICD-10-CM | POA: Diagnosis not present

## 2020-05-29 DIAGNOSIS — N390 Urinary tract infection, site not specified: Secondary | ICD-10-CM | POA: Diagnosis not present

## 2020-05-29 DIAGNOSIS — D899 Disorder involving the immune mechanism, unspecified: Secondary | ICD-10-CM | POA: Insufficient documentation

## 2020-05-29 DIAGNOSIS — Z940 Kidney transplant status: Secondary | ICD-10-CM | POA: Insufficient documentation

## 2020-05-29 DIAGNOSIS — E1129 Type 2 diabetes mellitus with other diabetic kidney complication: Secondary | ICD-10-CM | POA: Insufficient documentation

## 2020-05-29 DIAGNOSIS — D631 Anemia in chronic kidney disease: Secondary | ICD-10-CM | POA: Diagnosis not present

## 2020-05-29 DIAGNOSIS — Z114 Encounter for screening for human immunodeficiency virus [HIV]: Secondary | ICD-10-CM | POA: Diagnosis not present

## 2020-05-29 LAB — BASIC METABOLIC PANEL
Anion gap: 5 (ref 5–15)
BUN: 42 mg/dL — ABNORMAL HIGH (ref 8–23)
CO2: 24 mmol/L (ref 22–32)
Calcium: 9.4 mg/dL (ref 8.9–10.3)
Chloride: 110 mmol/L (ref 98–111)
Creatinine, Ser: 1.46 mg/dL — ABNORMAL HIGH (ref 0.61–1.24)
GFR calc Af Amer: 59 mL/min — ABNORMAL LOW (ref >60)
GFR calc non Af Amer: 51 mL/min — ABNORMAL LOW (ref >60)
Glucose, Bld: 86 mg/dL (ref 70–99)
Potassium: 4.8 mmol/L (ref 3.5–5.1)
Sodium: 139 mmol/L (ref 135–145)

## 2020-05-29 LAB — CBC WITH DIFFERENTIAL/PLATELET
Abs Immature Granulocytes: 0.02 10*3/uL (ref 0.00–0.07)
Basophils Absolute: 0.1 10*3/uL (ref 0.0–0.1)
Basophils Relative: 1 %
Eosinophils Absolute: 0.7 10*3/uL — ABNORMAL HIGH (ref 0.0–0.5)
Eosinophils Relative: 12 %
HCT: 31 % — ABNORMAL LOW (ref 39.0–52.0)
Hemoglobin: 9.7 g/dL — ABNORMAL LOW (ref 13.0–17.0)
Immature Granulocytes: 0 %
Lymphocytes Relative: 21 %
Lymphs Abs: 1.2 10*3/uL (ref 0.7–4.0)
MCH: 30.4 pg (ref 26.0–34.0)
MCHC: 31.3 g/dL (ref 30.0–36.0)
MCV: 97.2 fL (ref 80.0–100.0)
Monocytes Absolute: 0.5 10*3/uL (ref 0.1–1.0)
Monocytes Relative: 8 %
Neutro Abs: 3.4 10*3/uL (ref 1.7–7.7)
Neutrophils Relative %: 58 %
Platelets: 168 10*3/uL (ref 150–400)
RBC: 3.19 MIL/uL — ABNORMAL LOW (ref 4.22–5.81)
RDW: 13 % (ref 11.5–15.5)
WBC: 5.8 10*3/uL (ref 4.0–10.5)
nRBC: 0 % (ref 0.0–0.2)

## 2020-05-29 LAB — MAGNESIUM: Magnesium: 1.7 mg/dL (ref 1.7–2.4)

## 2020-05-29 LAB — PHOSPHORUS: Phosphorus: 3.6 mg/dL (ref 2.5–4.6)

## 2020-05-31 DIAGNOSIS — Z940 Kidney transplant status: Principal | ICD-10-CM

## 2020-05-31 LAB — TACROLIMUS, TROUGH: Lab: 6.3

## 2020-06-04 NOTE — Unmapped (Signed)
[facility] Clinical Assessment & Refill Coordination Note    Steven Cooley, DOB: 04-25-1956  Phone: [phone] (home)     All above HIPAA information was verified with patient.     Was a Nurse, learning disability used for this call? No    Specialty Medication(s):   Transplant: cinacalcet 60mg      Current Outpatient Medications   Medication Sig Dispense Refill   ??? amLODIPine (NORVASC) 2.5 MG tablet TAKE 2 TABLETS DAILY 180 tablet 3   ??? aspirin (ECOTRIN) 81 MG tablet Take 81 mg by mouth daily.     ??? atorvastatin (LIPITOR) 20 MG tablet TAKE 1 TABLET NIGHTLY 90 tablet 3   ??? BD INSULIN SYRINGE ULT-FINE II 1/2 mL 31 x 5/16 Syrg      ??? brinzolamide-brimonidine (SIMBRINZA) 1-0.2 % DrpS Apply 1 drop to eye two (2) times a day. Right eye     ??? cinacalcet (SENSIPAR) 60 MG tablet Take 1 tablet (60 mg total) by mouth daily. 90 tablet 3   ??? ferrous sulfate (IRON, FERROUS SULFATE,) 325 (65 FE) MG tablet Take 325 mg by mouth daily with breakfast.      ??? furosemide (LASIX) 20 MG tablet Take 1 tablet (20 mg total) by mouth every other day. 45 tablet 3   ??? insulin ASPART (NOVOLOG) 100 unit/mL injection Inject 1-10 Units under the skin Three (3) times a day before meals.      ??? insulin glargine U-300 conc (TOUJEO MAX U-300 SOLOSTAR) 300 unit/mL (3 mL) InPn Inject 12 Units under the skin daily.      ??? lisinopriL (PRINIVIL,ZESTRIL) 40 MG tablet TAKE 1 TABLET DAILY 90 tablet 3   ??? mycophenolate (MYFORTIC) 360 MG TbEC TAKE 1 TABLET TWICE A DAY 180 tablet 3   ??? netarsudiL (RHOPRESSA) 0.02 % Drop Apply 1 drop to eye nightly. Right eye     ??? omeprazole (PRILOSEC) 20 MG capsule TAKE 1 CAPSULE DAILY AS NEEDED 90 capsule 3   ??? ONE TOUCH DELICA LANCETS 30 gauge Misc      ??? ONE TOUCH ULTRA TEST Strp      ??? PROGRAF 1 mg capsule TAKE 3 CAPSULES (3 MG) IN THE MORNING AND 2 CAPSULES (2 MG) IN THE EVENING 360 capsule 4   ??? triamcinolone (KENALOG) 0.1 % cream      ??? VITAMIN D3 2,000 unit cap TAKE 1 CAPSULE DAILY AT 0600 90 capsule 4     No current facility-administered medications for this visit.        Changes to medications: did not have med list with him but said no changes in last several months    No Known Allergies    Changes to allergies: No    SPECIALTY MEDICATION ADHERENCE     cinacalcet 60mg   : 12 days of medicine on hand     Medication Adherence    Patient reported X missed doses in the last month: 0  Specialty Medication: cinacalcet 60mg           Specialty medication(s) dose(s) confirmed: Regimen is correct and unchanged.     Are there any concerns with adherence? No    Adherence counseling provided? Not needed    CLINICAL MANAGEMENT AND INTERVENTION      Clinical Benefit Assessment:    Do you feel the medicine is effective or helping your condition? Yes    Clinical Benefit counseling provided? Not needed    Adverse Effects Assessment:    Are you experiencing any  side effects? No    Are you experiencing difficulty administering your medicine? No    Quality of Life Assessment:    How many days over the past month did your transplant  keep you from your normal activities? For example, brushing your teeth or getting up in the morning. 0    Have you discussed this with your provider? Not needed    Therapy Appropriateness:    Is therapy appropriate? Yes, therapy is appropriate and should be continued    DISEASE/MEDICATION-SPECIFIC INFORMATION      N/A    PATIENT SPECIFIC NEEDS     - Does the patient have any physical, cognitive, or cultural barriers? No    - Is the patient high risk? Yes, patient is taking a REMS drug. Medication is dispensed in compliance with REMS program. but not from ssc    - Does the patient require a Care Management Plan? No     - Does the patient require physician intervention or other additional services (i.e. nutrition, smoking cessation, social work)? No      SHIPPING     Specialty Medication(s) to be Shipped:   Transplant: cinacalcet 60mg     Other medication(s) to be shipped: na     Changes to insurance: No    Delivery Scheduled: Yes, Expected medication delivery date: 06/12/2020.     Medication will be delivered via UPS to the confirmed prescription address in [facility].    The patient will receive a drug information handout for each medication shipped and additional FDA Medication Guides as required.  Verified that patient has previously received a Conservation officer, historic buildings.    All of the patient's questions and concerns have been addressed.    Thad Ranger   [facility] Specialty Pharmacist

## 2020-06-11 MED FILL — CINACALCET 60 MG TABLET: ORAL | 30 days supply | Qty: 30 | Fill #4

## 2020-06-11 MED FILL — CINACALCET 60 MG TABLET: 30 days supply | Qty: 30 | Fill #4 | Status: AC

## 2020-06-23 ENCOUNTER — Other Ambulatory Visit: Payer: Self-pay | Admitting: Family Medicine

## 2020-06-24 NOTE — Telephone Encounter (Signed)
Requested medication (s) are due for refill today: no  Requested medication (s) are on the active medication list: yes  Last refill:  03/05/2020  Future visit scheduled: no  Notes to clinic:  Patient overdue for labs and follow up appointment    Requested Prescriptions  Pending Prescriptions Disp Refills   NOVOLOG 100 UNIT/ML injection [Pharmacy Med Name: NOVOLOG VIAL 10ML 100U/ML] 30 mL 3    Sig: INJECT 7 UNITS UNDER THE SKIN BEFORE BREAKFAST, 5 UNITS BEFORE LUNCH AND 4 UNITS BEFORE DINNER      Endocrinology:  Diabetes - Insulins Failed - 06/23/2020  9:17 PM      Failed - HBA1C is between 0 and 7.9 and within 180 days    Hemoglobin A1C  Date Value Ref Range Status  11/26/2016 6.5  Final          Failed - Valid encounter within last 6 months    Recent Outpatient Visits           2 years ago Need for shingles vaccine   [facility] Trinna Post, [place]   3 years ago [place] Carles Collet M, [place]   4 years ago Annual physical exam   [facility] Jerrol Banana., [place]   4 years ago Syncope and collapse   [facility] Jerrol Banana., [place]

## 2020-07-03 NOTE — Unmapped (Signed)
[facility] Refill Coordination Note    Specialty Medication(s) to be Shipped:   Transplant: Cinacalcet 60mg     Other medication(s) to be shipped: N/A     Steven Cooley, DOB: 1956-06-28  Phone: [phone] (home)       All above HIPAA information was verified with patient.     Was a Nurse, learning disability used for this call? No    Completed refill call assessment today to schedule patient's medication shipment from the [facility] [phone]).       Specialty medication(s) and dose(s) confirmed: Regimen is correct and unchanged.   Changes to medications: [name] reports no changes at this time.  Changes to insurance: No  Questions for the pharmacist: No    Confirmed patient received Welcome Packet with first shipment. The patient will receive a drug information handout for each medication shipped and additional FDA Medication Guides as required.       DISEASE/MEDICATION-SPECIFIC INFORMATION        N/A    SPECIALTY MEDICATION ADHERENCE     Medication Adherence    Patient reported X missed doses in the last month: 0  Specialty Medication: Cinacalcet 60mg   Patient is on additional specialty medications: No        Cinacalcet 60 mg: 9 days of medicine on hand     SHIPPING     Shipping address confirmed in Epic.     Delivery Scheduled: Yes, Expected medication delivery date: 07/11/2020.     Medication will be delivered via Next Day Courier to the prescription address in Epic WAM.    [name]   [facility] Specialty Technician

## 2020-07-09 DIAGNOSIS — Z940 Kidney transplant status: Principal | ICD-10-CM

## 2020-07-09 DIAGNOSIS — Z79899 Other long term (current) drug therapy: Principal | ICD-10-CM

## 2020-07-09 DIAGNOSIS — Z1159 Encounter for screening for other viral diseases: Principal | ICD-10-CM

## 2020-07-09 DIAGNOSIS — D631 Anemia in chronic kidney disease: Principal | ICD-10-CM

## 2020-07-09 DIAGNOSIS — N189 Chronic kidney disease, unspecified: Principal | ICD-10-CM

## 2020-07-09 DIAGNOSIS — Z114 Encounter for screening for human immunodeficiency virus [HIV]: Principal | ICD-10-CM

## 2020-07-09 NOTE — Unmapped (Signed)
left voicemail and sent MyChart message, called to schedule follow up appt with Dr. Carlene Coria.  Can be scheduled from the Recall tab. 7/27 EW

## 2020-07-10 MED FILL — CINACALCET 60 MG TABLET: 30 days supply | Qty: 30 | Fill #5 | Status: AC

## 2020-07-10 MED FILL — CINACALCET 60 MG TABLET: ORAL | 30 days supply | Qty: 30 | Fill #5

## 2020-07-29 NOTE — Unmapped (Signed)
Pt called inquiring about [place] stance on 3rd covid vaccine. I let him know we strongly encourage a 3rd vaccine - educated him it should be the same as his prior vaccines, confirmed those dates and updated Epic. He also let me know that he will be going to a family wedding soon with many unvaccinated people - I advised him that going to the wedding masked and distanced might be ok, but that if he could avoid the reception where people will be eating and talking, etc, it would be best. He had the same idea and just wanted support from our office to use for his family. He appreciated the support and confirmed he'll be here for clinic on 08/21/20.

## 2020-08-02 LAB — CBC W/ DIFFERENTIAL
BASOPHILS ABSOLUTE COUNT: 0.1 10*9/L
BASOPHILS RELATIVE PERCENT: 1 %
EOSINOPHILS ABSOLUTE COUNT: 0.7 10*9/L — ABNORMAL HIGH
EOSINOPHILS RELATIVE PERCENT: 12 %
HEMATOCRIT: 31 % — ABNORMAL LOW
HEMOGLOBIN: 9.7 g/dL — ABNORMAL LOW
MEAN CORPUSCULAR HEMOGLOBIN CONC: 31.3 g/dL
MEAN CORPUSCULAR HEMOGLOBIN: 30.4 pg
MEAN CORPUSCULAR VOLUME: 97.2 fL
MONOCYTES ABSOLUTE COUNT: 0.5 10*9/L
MONOCYTES RELATIVE PERCENT: 8 %
NEUTROPHILS ABSOLUTE COUNT: 3.4 10*9/L
NEUTROPHILS RELATIVE PERCENT: 58 %
NUCLEATED RED BLOOD CELLS: 0 /100{WBCs}
PLATELET COUNT: 168 10*9/L
RED BLOOD CELL COUNT: 3.19 10*12/L — ABNORMAL LOW
RED CELL DISTRIBUTION WIDTH: 13 %
WBC ADJUSTED: 5.8 10*9/L

## 2020-08-02 LAB — EGFR CKD-EPI NON-AA FEMALE: Lab: 0

## 2020-08-02 LAB — BASIC METABOLIC PANEL
BLOOD UREA NITROGEN: 42 mg/dL — ABNORMAL HIGH
CALCIUM: 9.4 mg/dL
CHLORIDE: 110 mmol/L
CO2: 24 mmol/L
CREATININE: 1.46 mg/dL — ABNORMAL HIGH
EGFR CKD-EPI NON-AA MALE: 51 mL/min/{1.73_m2} — ABNORMAL LOW
GLUCOSE RANDOM: 86 mg/dL
POTASSIUM: 4.8 mmol/L

## 2020-08-02 LAB — MONOCYTES ABSOLUTE COUNT: Lab: 0.5

## 2020-08-02 NOTE — Unmapped (Signed)
[facility] Refill Coordination Note    Specialty Medication(s) to be Shipped:   Transplant: Cinacalcet 60mg     Other medication(s) to be shipped: No additional medications requested for fill at this time     Markanthony Gedney, DOB: [birth year]  Phone: [phone] (home)       All above HIPAA information was verified with patient.     Was a Nurse, learning disability used for this call? No    Completed refill call assessment today to schedule patient's medication shipment from the [facility] [phone]).       Specialty medication(s) and dose(s) confirmed: Regimen is correct and unchanged.   Changes to medications: Cinsere reports no changes at this time.  Changes to insurance: No  Questions for the pharmacist: No    Confirmed patient received Welcome Packet with first shipment. The patient will receive a drug information handout for each medication shipped and additional FDA Medication Guides as required.       DISEASE/MEDICATION-SPECIFIC INFORMATION        N/A    SPECIALTY MEDICATION ADHERENCE     Medication Adherence    Patient reported X missed doses in the last month: 0  Specialty Medication: Cinacalcet 60mg   Patient is on additional specialty medications: No        Cinacalcet 60 mg: 10 days of medicine on hand     SHIPPING     Shipping address confirmed in Epic.     Delivery Scheduled: Yes, Expected medication delivery date: 08/09/2020.     Medication will be delivered via Next Day Courier to the prescription address in Epic WAM.    [name]   [facility] Specialty Technician

## 2020-08-08 MED FILL — CINACALCET 60 MG TABLET: ORAL | 30 days supply | Qty: 30 | Fill #6

## 2020-08-08 MED FILL — CINACALCET 60 MG TABLET: 30 days supply | Qty: 30 | Fill #6 | Status: AC

## 2020-08-13 ENCOUNTER — Other Ambulatory Visit: Payer: Self-pay

## 2020-08-13 ENCOUNTER — Ambulatory Visit
Admission: EM | Admit: 2020-08-13 | Discharge: 2020-08-13 | Disposition: A | Discharge status: Home / Self Care | Payer: BC Managed Care – PPO | Attending: Internal Medicine | Admitting: Internal Medicine

## 2020-08-13 ENCOUNTER — Encounter: Payer: Self-pay | Admitting: Emergency Medicine

## 2020-08-13 ENCOUNTER — Ambulatory Visit: Payer: BC Managed Care – PPO

## 2020-08-13 DIAGNOSIS — I83893 Varicose veins of bilateral lower extremities with other complications: Secondary | ICD-10-CM

## 2020-08-13 DIAGNOSIS — M722 Plantar fascial fibromatosis: Secondary | ICD-10-CM

## 2020-08-13 NOTE — ED Triage Notes (Signed)
Patient c/o left foot pain and swelling that started over 1 month ago. Patient denies injury.

## 2020-08-15 NOTE — ED Provider Notes (Signed)
[facility]    CSN: [id number] Arrival date & time: 08/13/20  1555      History   Chief Complaint Chief Complaint  Patient presents with  . Appointment  . Foot Pain    HPI Rick Wang is a 64 y.o. male comes to the urgent care with complaint of left foot pain off over 1 month duration. Patient describes the pain as involving the undersurface of the left foot and also with swelling of the left foot. No injuries to the foot recently. Patient also complains of swelling of the left foot which has not improved with wearing compression stockings. No calf pain or calf tenderness. No recent travel. No shortness of breath, cough or sputum production.Marland Kitchen   HPI  Past Medical History:  Diagnosis Date  . Chronic kidney disease    Bilateral kidney failure. Rcvd new RIGHT kidney.  . Diabetes mellitus without complication ([place])   . Heart murmur    followed by PCP  . Hypercholesteremia   . Hypertension     Patient Active Problem List   Diagnosis Date Noted  . Lymphedema 05/31/2017  . Chronic venous insufficiency 05/31/2017  . Special screening for malignant neoplasms, colon   . Type I diabetes mellitus ([place]) 02/03/2016  . Diabetic retinopathy ([place]) 02/03/2016  . 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 End-stage renal disease ([place]) 02/03/2016  . Glaucoma 02/03/2016  . HLD (hyperlipidemia) 02/03/2016  . Kidney failure 02/03/2016  . Basal cell carcinoma of back 02/25/2015  . H/O thrombosis 02/12/2012  . BP (high blood pressure) 01/13/2011  . H/O kidney transplant 07/08/2010    Past Surgical History:  Procedure Laterality Date  . CATARACT EXTRACTION    . COLONOSCOPY WITH PROPOFOL N/A 04/06/2016   Procedure: COLONOSCOPY WITH PROPOFOL;  Surgeon: Lucilla Lame, MD;  Location: [place];  Service: Endoscopy;  Laterality: N/A;  . HERNIA REPAIR  7/62/83   umbilical  . PERITONEAL CATHETER INSERTION    . TRANSPLANTATION RENAL Right 09/01/11        Home Medications    Prior to Admission medications   Medication Sig Start Date End Date Taking? Authorizing Provider  aspirin 81 MG tablet Take by mouth. 08/08/13  Yes [provider]  atorvastatin (LIPITOR) 20 MG tablet Take by mouth. 08/08/13  Yes [provider]  Brinzolamide-Brimonidine [facility]) 1-0.2 % SUSP Apply to eye. 08/08/13  Yes [provider]  docusate sodium (COLACE) 100 MG capsule Take by mouth. 08/08/13  Yes [provider]  ferrous sulfate 325 (65 FE) MG tablet Take by mouth. 08/08/13  Yes [provider]  insulin aspart (NOVOLOG) 100 UNIT/ML injection INJECT 7 UNITS UNDER THE SKIN BEFORE BREAKFAST, 5 UNITS BEFORE LUNCH AND 4 UNITS BEFORE DINNER 04/03/17  Yes Birdie Sons, MD  Insulin Glargine (TOUJEO SOLOSTAR) 300 UNIT/ML SOPN Inject 16 Units into the skin daily.    Yes [provider]  Insulin Syringe-Needle U-100 (BD INSULIN SYRINGE ULTRAFINE) 31G X 5/16" 0.5 ML MISC  10/09/13  Yes [provider]  mycophenolate (MYFORTIC) 180 MG EC tablet Take by mouth. 08/08/13  Yes [provider]  NOVOLOG 100 UNIT/ML injection INJECT 7 UNITS UNDER THE SKIN BEFORE BREAKFAST, 5 UNITS BEFORE LUNCH AND 4 UNITS BEFORE DINNER 04/03/19  Yes Jerrol Banana., MD  [facility] LANCETS FINE [place]  10/09/13  Yes [provider]  Polyethylene Glycol 3350 GRAN Take by mouth. 08/08/13  Yes [provider]  SENSIPAR 60 MG  tablet  05/20/17  Yes [provider]  tacrolimus (PROGRAF) 1 MG capsule  05/10/15  Yes [provider]  furosemide (LASIX) 20 MG tablet Take 20 mg by mouth. 07/16/15 07/15/16  [provider]  lisinopril (PRINIVIL,ZESTRIL) 40 MG tablet Take 40 mg by mouth. 08/15/15 08/14/16  [provider]    Family History Family History  Problem Relation Age of Onset  . Diabetes Mother   . Congestive Heart Failure Mother   . Prostate cancer Father   . Diabetes  Father   . Kidney disease Father     Social History Social History   Tobacco Use  . Smoking status: Never Smoker  . Smokeless tobacco: Never Used  Substance Use Topics  . Alcohol use: No  . Drug use: No     Allergies   Patient has no known allergies.   Review of Systems Review of Systems  Constitutional: Negative.   Respiratory: Negative.  Negative for chest tightness and shortness of breath.   Cardiovascular: Negative for chest pain.  Gastrointestinal: Negative.   Musculoskeletal: Negative for back pain, joint swelling, neck pain and neck stiffness.  Neurological: Negative.      Physical Exam Triage Vital Signs ED Triage Vitals  Enc Vitals Group     BP 08/13/20 1627 (!) 147/72     Pulse Rate 08/13/20 1627 66     Resp 08/13/20 1627 18     Temp 08/13/20 1627 98.1 F (36.7 C)     Temp Source 08/13/20 1627 Oral     SpO2 08/13/20 1627 100 %     Weight 08/13/20 1625 140 lb (63.5 kg)     Height 08/13/20 1625 5\' 10"  (1.778 m)     Head Circumference --      Peak Flow --      Pain Score 08/13/20 1625 2     Pain Loc --      Pain Edu? --      Excl. in [place]? --    No data found.  Updated Vital Signs BP (!) 147/72 (BP Location: Right Arm)   Pulse 66   Temp 98.1 F (36.7 C) (Oral)   Resp 18   Ht 5\' 10"  (1.778 m)   Wt 63.5 kg   SpO2 100%   BMI 20.09 kg/m   Visual Acuity Right Eye Distance:   Left Eye Distance:   Bilateral Distance:    Right Eye Near:   Left Eye Near:    Bilateral Near:     Physical Exam Vitals and nursing note reviewed.  Constitutional:      General: He is not in acute distress.    Appearance: Normal appearance. He is not ill-appearing.  Cardiovascular:     Rate and Rhythm: Normal rate and regular rhythm.     Pulses: Normal pulses.  Abdominal:     General: Bowel sounds are normal.     Palpations: Abdomen is soft.  Musculoskeletal:        General: Tenderness present. Normal range of motion.     Left lower leg: Edema present.      Comments: Tenderness over the plantar surface of the left foot. Varicose veins involving the left lower extremity. 1+ swelling in the left lower extremity.  Skin:    Capillary Refill: Capillary refill takes less than 2 seconds.  Neurological:     Mental Status: He is alert.      UC Treatments / Results  Labs (all labs ordered are listed, but only abnormal  results are displayed) Labs Reviewed - No data to display  EKG   Radiology No results found.  Procedures Procedures (including critical care time)  Medications Ordered in UC Medications - No data to display  Initial Impression / Assessment and Plan / UC Course  I have reviewed the triage vital signs and the nursing notes.  Pertinent labs & imaging results that were available during my care of the patient were reviewed by me and considered in my medical decision making (see chart for details).     1. Plantar fasciitis of the left leg: Podiatrist information given to patient for follow-up NSAIDs as needed for pain Left shoe inserts Return precautions given  2. Varicose veins in lower extremities: Compression stockings Elevation of lower extremities Patient may see vascular surgeon if conservative measures does not improve. Final Clinical Impressions(s) / UC Diagnoses   Final diagnoses:  Plantar fasciitis of left foot  Varicose veins of bilateral lower extremities with other complications   Discharge Instructions   None    ED Prescriptions    None     PDMP not reviewed this encounter.   Chase Picket, MD 08/15/20 1343

## 2020-08-21 ENCOUNTER — Ambulatory Visit
Admit: 2020-08-21 | Discharge: 2020-08-22 | Payer: PRIVATE HEALTH INSURANCE | Attending: Nephrology | Primary: Nephrology

## 2020-08-21 ENCOUNTER — Encounter: Admit: 2020-08-21 | Discharge: 2020-08-22 | Payer: PRIVATE HEALTH INSURANCE

## 2020-08-21 ENCOUNTER — Ambulatory Visit: Admit: 2020-08-21 | Discharge: 2020-08-22 | Payer: PRIVATE HEALTH INSURANCE

## 2020-08-21 DIAGNOSIS — Z79899 Other long term (current) drug therapy: Principal | ICD-10-CM

## 2020-08-21 DIAGNOSIS — Z1159 Encounter for screening for other viral diseases: Principal | ICD-10-CM

## 2020-08-21 DIAGNOSIS — R634 Abnormal weight loss: Principal | ICD-10-CM

## 2020-08-21 DIAGNOSIS — Z86718 Personal history of other venous thrombosis and embolism: Principal | ICD-10-CM

## 2020-08-21 DIAGNOSIS — D631 Anemia in chronic kidney disease: Principal | ICD-10-CM

## 2020-08-21 DIAGNOSIS — N189 Chronic kidney disease, unspecified: Secondary | ICD-10-CM

## 2020-08-21 DIAGNOSIS — Z940 Kidney transplant status: Principal | ICD-10-CM

## 2020-08-21 DIAGNOSIS — D849 Immunodeficiency, unspecified: Principal | ICD-10-CM

## 2020-08-21 DIAGNOSIS — Z114 Encounter for screening for human immunodeficiency virus [HIV]: Principal | ICD-10-CM

## 2020-08-21 LAB — COMPREHENSIVE METABOLIC PANEL
ALBUMIN: 3.4 g/dL (ref 3.4–5.0)
ALKALINE PHOSPHATASE: 76 U/L (ref 46–116)
ALT (SGPT): 12 U/L (ref 10–49)
ANION GAP: 5 mmol/L (ref 5–14)
AST (SGOT): 14 U/L (ref <=34)
BILIRUBIN TOTAL: 0.5 mg/dL (ref 0.3–1.2)
BLOOD UREA NITROGEN: 36 mg/dL — ABNORMAL HIGH (ref 9–23)
BUN / CREAT RATIO: 26
CALCIUM: 10 mg/dL (ref 8.7–10.4)
CHLORIDE: 110 mmol/L — ABNORMAL HIGH (ref 98–107)
CO2: 28.5 mmol/L (ref 20.0–31.0)
CREATININE: 1.41 mg/dL — ABNORMAL HIGH
EGFR CKD-EPI AA MALE: 61 mL/min/{1.73_m2} (ref >=60)
EGFR CKD-EPI NON-AA MALE: 53 mL/min/{1.73_m2} — ABNORMAL LOW (ref >=60)
GLUCOSE RANDOM: 89 mg/dL (ref 70–99)
POTASSIUM: 4.6 mmol/L — ABNORMAL HIGH (ref 3.4–4.5)
SODIUM: 143 mmol/L (ref 135–145)

## 2020-08-21 LAB — CBC W/ AUTO DIFF
BASOPHILS ABSOLUTE COUNT: 0.1 10*9/L (ref 0.0–0.1)
BASOPHILS RELATIVE PERCENT: 1.8 %
EOSINOPHILS ABSOLUTE COUNT: 0.6 10*9/L (ref 0.0–0.7)
EOSINOPHILS RELATIVE PERCENT: 11.9 %
HEMATOCRIT: 30 % — ABNORMAL LOW (ref 38.0–50.0)
HEMOGLOBIN: 9.8 g/dL — ABNORMAL LOW (ref 13.5–17.5)
LYMPHOCYTES ABSOLUTE COUNT: 1.1 10*9/L (ref 0.7–4.0)
LYMPHOCYTES RELATIVE PERCENT: 21 %
MEAN CORPUSCULAR HEMOGLOBIN: 30.6 pg (ref 26.0–34.0)
MEAN CORPUSCULAR VOLUME: 93.9 fL (ref 81.0–95.0)
MEAN PLATELET VOLUME: 7.5 fL (ref 7.0–10.0)
MONOCYTES ABSOLUTE COUNT: 0.5 10*9/L (ref 0.1–1.0)
MONOCYTES RELATIVE PERCENT: 9.2 %
NEUTROPHILS ABSOLUTE COUNT: 2.8 10*9/L (ref 1.7–7.7)
NUCLEATED RED BLOOD CELLS: 0 /100{WBCs} (ref <=4)
PLATELET COUNT: 262 10*9/L (ref 150–450)
RED BLOOD CELL COUNT: 3.19 10*12/L — ABNORMAL LOW (ref 4.32–5.72)
RED CELL DISTRIBUTION WIDTH: 13.3 % (ref 12.0–15.0)

## 2020-08-21 LAB — URINALYSIS
BACTERIA: NONE SEEN /HPF
BILIRUBIN UA: NEGATIVE
BLOOD UA: NEGATIVE
GLUCOSE UA: NEGATIVE
KETONES UA: NEGATIVE
LEUKOCYTE ESTERASE UA: NEGATIVE
PH UA: 6 (ref 5.0–9.0)
PROTEIN UA: NEGATIVE
SPECIFIC GRAVITY UA: 1.015 (ref 1.005–1.030)
SQUAMOUS EPITHELIAL: <1 /HPF (ref 0–5)
UROBILINOGEN UA: 0.2
WBC UA: <1 /HPF (ref <2)

## 2020-08-21 LAB — PHOSPHORUS
PHOSPHORUS: 3.5 mg/dL (ref 2.4–5.1)
Phosphate:MCnc:Pt:Ser/Plas:Qn:: 3.5

## 2020-08-21 LAB — BILIRUBIN DIRECT: Bilirubin.glucuronidated+Bilirubin.albumin bound:MCnc:Pt:Ser/Plas:Qn:: 0.2

## 2020-08-21 LAB — IRON & TIBC
IRON SATURATION: 26 %
IRON: 70 ug/dL

## 2020-08-21 LAB — AST (SGOT): Aspartate aminotransferase:CCnc:Pt:Ser/Plas:Qn:: 14

## 2020-08-21 LAB — LIPID PANEL
CHOLESTEROL/HDL RATIO SCREEN: 1.9 (ref 1.0–4.5)
CHOLESTEROL: 91 mg/dL (ref <=200)
LDL CHOLESTEROL CALCULATED: 35 mg/dL — ABNORMAL LOW (ref 40–99)
NON-HDL CHOLESTEROL: 43 mg/dL — ABNORMAL LOW (ref 70–130)
TRIGLYCERIDES: 39 mg/dL (ref 0–150)
VLDL CHOLESTEROL CAL: 7.8 mg/dL — ABNORMAL LOW (ref 12–42)

## 2020-08-21 LAB — PROTEIN / CREATININE RATIO, URINE: CREATININE, URINE: 52.3 mg/dL

## 2020-08-21 LAB — HDL CHOLESTEROL: Cholesterol.in HDL:MCnc:Pt:Ser/Plas:Qn:: 48

## 2020-08-21 LAB — HEPATITIS B SURFACE ANTIGEN
HEPATITIS B SURFACE ANTIGEN: NONREACTIVE
Hepatitis B virus surface Ag:PrThr:Pt:Ser:Ord:: NONREACTIVE

## 2020-08-21 LAB — HEPATITIS C ANTIBODY: Hepatitis C virus Ab:PrThr:Pt:Ser:Ord:: NONREACTIVE

## 2020-08-21 LAB — BILIRUBIN UA: Bilirubin:PrThr:Pt:Urine:Ord:Test strip: NEGATIVE

## 2020-08-21 LAB — HIV ANTIGEN/ANTIBODY COMBO: HIV 1+2 Ab+HIV1 p24 Ag:PrThr:Pt:Ser/Plas:Ord:IA: NONREACTIVE

## 2020-08-21 LAB — HEPATITIS B CORE TOTAL ANTIBODY: Hepatitis B virus core Ab:PrThr:Pt:Ser/Plas:Ord:IA: NONREACTIVE

## 2020-08-21 LAB — IRON: Iron:MCnc:Pt:Ser/Plas:Qn:: 70

## 2020-08-21 LAB — TACROLIMUS, TROUGH: Lab: 6.7

## 2020-08-21 LAB — HEMOGLOBIN A1C: Hemoglobin A1c/Hemoglobin.total:MFr:Pt:Bld:Qn:: 6.3 — ABNORMAL HIGH

## 2020-08-21 LAB — WBC ADJUSTED: Leukocytes:NCnc:Pt:Bld:Qn:: 5.1

## 2020-08-21 LAB — PROTEIN/CREAT RATIO, URINE: Protein/Creatinine:MRto:Pt:Urine:Qn:: 0.207

## 2020-08-21 LAB — MAGNESIUM: Magnesium:MCnc:Pt:Ser/Plas:Qn:: 1.6

## 2020-08-21 NOTE — Unmapped (Signed)
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 He presents today for a routine follow up. His post transplant course has been complicated by a DVT and has right lower extremity. he was treated with Coumadin for a duration of time after the clot, but has been off that for quite some time. Patient does not have evidence of donor specific antibodies as of 02/28/20. His creatinine had trended up from 1.2-1.4 to 1.6-1.8 following initiation of lisinopril and titrating dose upwards, though more recently has been back to 1.2-1.5.    At his visit 08/23/19, he had concerns about weight loss. He had some unexpected time out from work and some stressful events in his life (his father passed away and his mother is in a nursing home). To cope with this, he started walking and he was walking 3-4 miles daily, sometimes up to 6-7 miles. He had not really increased his eating to account for the additional calorie burn. He had lost about 5 pounds from his previous visit.    Interval history since last seen 02/28/2020    His weight at his last visit was stable.    He saw endocrinology 03/25/20 with no changes to his regimen.    Seen in urgent care 08/13/20 for left foot pain for a month. Felt due to plantar fasciitis, recommended left shoe inserts and NSAIDs as needed. Given info for podiatry. Also noted to have varicose veins bilaterally.    He presents today for follow up.     He has noted some left foot swelling for the last 1.5 months. It was initially intermittent but is now constant. It improves some with elevation, and has progressed into his calf. The last couple of weeks it has been associated with pain over the top of his foot and occasionally on the bottom. More of a burning pain. He reports pain 0-2 but occasionally gets up to 8. Notes that it is worse at the end of the day. He has had some chronic right LE edema after a DVT that is unchanged. Denies SOB.    He continues to lose some weight in spite of a normal appetite and intake.    He specifically denies fever, myalgias, upper respiratory/GI symptoms, or change in taste/smell.    Last dose of Prograf: 9 pm.    Review of Systems    Otherwise on review of systems patient denies fever or chills, chest pain, SOB, PND or orthopnea.  Denies N/V/abdominal pain. No dysuria, hematuria or difficulty voiding. Bowel movements normal.  Denies joint pain or rash. All other systems are reviewed and are negative.    Medications    Current Outpatient Medications   Medication Sig Dispense Refill   ??? amLODIPine (NORVASC) 2.5 MG tablet TAKE 2 TABLETS DAILY 180 tablet 3   ??? aspirin (ECOTRIN) 81 MG tablet Take 81 mg by mouth daily.     ??? atorvastatin (LIPITOR) 20 MG tablet TAKE 1 TABLET NIGHTLY 90 tablet 3   ??? BD INSULIN SYRINGE ULT-FINE II 1/2 mL 31 x 5/16 Syrg      ??? brinzolamide-brimonidine (SIMBRINZA) 1-0.2 % DrpS Apply 1 drop to eye two (2) times a day. Right eye     ??? cinacalcet (SENSIPAR) 60 MG tablet Take 1 tablet (60 mg total) by mouth daily.  90 tablet 3   ??? ferrous sulfate (IRON, FERROUS SULFATE,) 325 (65 FE) MG tablet Take 325 mg by mouth daily with breakfast.      ??? insulin ASPART (NOVOLOG) 100 unit/mL injection Inject 1-10 Units under the skin Three (3) times a day before meals.      ??? insulin glargine U-300 conc (TOUJEO MAX U-300 SOLOSTAR) 300 unit/mL (3 mL) InPn Inject 14 Units under the skin daily.      ??? lisinopriL (PRINIVIL,ZESTRIL) 40 MG tablet TAKE 1 TABLET DAILY 90 tablet 3   ??? mycophenolate (MYFORTIC) 360 MG TbEC TAKE 1 TABLET TWICE A DAY 180 tablet 3   ??? netarsudiL (RHOPRESSA) 0.02 % Drop Apply 1 drop to eye nightly. Right eye     ??? omeprazole (PRILOSEC) 20 MG capsule TAKE 1 CAPSULE DAILY AS NEEDED 90 capsule 3   ??? ONE TOUCH DELICA LANCETS 30 gauge Misc      ??? ONE TOUCH ULTRA TEST Strp      ??? PROGRAF 1 mg capsule TAKE 3 CAPSULES (3 MG) IN THE MORNING AND 2 CAPSULES (2 MG) IN THE EVENING 360 capsule 4   ??? triamcinolone (KENALOG) 0.1 % cream      ??? VITAMIN D3 2,000 unit cap TAKE 1 CAPSULE DAILY AT 0600 90 capsule 4   ??? furosemide (LASIX) 20 MG tablet Take 1 tablet (20 mg total) by mouth every other day. 45 tablet 3     No current facility-administered medications for this visit.       Physical Exam    BP 135/70 (BP Site: R Arm, BP Position: Sitting, BP Cuff Size: Small)  - Pulse 63  - Temp 36.2 ??C (97.1 ??F) (Temporal)  - Wt 64.9 kg (143 lb)  - BMI 20.52 kg/m??   General: Patient is a pleasant male in no apparent distress.  Eyes: Sclera anicteric.  ENT: Oropharynx without lesions.   Neck: Supple without LAD/JVD/bruits.  Lungs: Clear to auscultation bilaterally, no wheezes/rales/rhonchi.  Cardiovascular: Regular rate and rhythm without murmurs, rubs or gallops.  Abdomen: Soft, notender/nondistended. Positive bowel sounds. No tenderness over the graft.  Extremities: RLE with compression sock in place; LLE +2 pitting edema of ankle  Skin: Without rash.  Neurological: Grossly nonfocal.  Psychiatric: Mood and affect appropriate.      Laboratory Results    Recent Results (from the past 170 hour(s))   Phosphorus Level    Collection Time: 08/21/20  8:27 AM   Result Value Ref Range    Phosphorus 3.5 2.4 - 5.1 mg/dL   Magnesium Level    Collection Time: 08/21/20  8:27 AM   Result Value Ref Range    Magnesium 1.6 1.6 - 2.6 mg/dL   Urinalysis    Collection Time: 08/21/20  8:27 AM   Result Value Ref Range    Color, UA Yellow     Clarity, UA Clear     Specific Gravity, UA 1.015 1.005 - 1.030    pH, UA 6.0 5.0 - 9.0    Leukocyte Esterase, UA Negative Negative    Nitrite, UA Negative Negative    Protein, UA Negative Negative    Glucose, UA Negative Negative    Ketones, UA Negative Negative    Urobilinogen, UA 0.2 mg/dL 0.2 - 2.0 mg/dL    Bilirubin, UA Negative Negative    Blood, UA Negative Negative    RBC, UA <1 <3 /HPF WBC, UA <1 <2 /HPF    Squam Epithel, UA <1 0 - 5 /HPF  Bacteria, UA None Seen None Seen /HPF   Urine Culture    Collection Time: 08/21/20  8:27 AM    Specimen: Clean Catch; Urine   Result Value Ref Range    Urine Culture, Comprehensive NO GROWTH    Protein/Creatinine Ratio, Urine    Collection Time: 08/21/20  8:27 AM   Result Value Ref Range    Creat U 52.3 Undefined mg/dL    Protein, Ur 38.1 mg/dL    Protein/Creatinine Ratio, Urine 0.207 Undefined   Comprehensive Metabolic Panel    Collection Time: 08/21/20  8:27 AM   Result Value Ref Range    Sodium 143 135 - 145 mmol/L    Potassium 4.6 (H) 3.4 - 4.5 mmol/L    Chloride 110 (H) 98 - 107 mmol/L    Anion Gap 5 5 - 14 mmol/L    CO2 28.5 20.0 - 31.0 mmol/L    BUN 36 (H) 9 - 23 mg/dL    Creatinine 8.29 (H) 0.70 - 1.30 mg/dL    BUN/Creatinine Ratio 26     EGFR CKD-EPI Non-African American, Male 53 (L) >=60 mL/min/1.51m2    EGFR CKD-EPI African American, Male 62 >=60 mL/min/1.48m2    Glucose 89 70 - 99 mg/dL    Calcium 93.7 8.7 - 16.9 mg/dL    Albumin 3.4 3.4 - 5.0 g/dL    Total Protein 6.4 5.7 - 8.2 g/dL    Total Bilirubin 0.5 0.3 - 1.2 mg/dL    AST 14 <=67 U/L    ALT 12 10 - 49 U/L    Alkaline Phosphatase 76 46 - 116 U/L   Cytology-Urine    Collection Time: 08/21/20  8:27 AM   Result Value Ref Range    Final Diagnosis       A.  Urine for decoy cells:  -Negative for high-grade urothelial carcinoma  -No decoy cells identified    This electronic signature is attestation that the pathologist personally reviewed the submitted material(s) and the final diagnosis reflects that evaluation.      Clinical History       Urine for Decoy cells, s/p Kidney Transplant      Gross Description            Urine clean catch voided decoy    60mL clear, yellow fluid, unfixed    Materials Prepared & Examined    Smear Slides.......0  Monolayers..........1  Cytospins.............0  Cell Blocks...........0  Core Biopsy.........0  Touch Prep..........0          Microscopic Description Microscopic examination substantiates the above diagnosis.    Resident Physician: None Assigned      EMBEDDED IMAGES      Specimen Adequacy Satisfactory for evaluation     Disclaimer       Unless otherwise specified, specimens are preserved using 10% neutral buffered formalin. For cases in which immunohistochemical and/or in-situ hybridization stains are performed, the following statement applies: Appropriate controls for each stain (positive controls with or without negative controls) have been evaluated and stain as expected. These stains have not been separately validated for use on decalcified specimens and should be interpreted with caution in that setting. Some of the reagents used for these stains may be classified as analyte specific reagents (ASR). Tests using ASRs were developed, and their performance characteristics were determined, by the Anatomic Pathology Department [facility]). They have not been cleared or approved by the [place] Food and Drug Administration (FDA). The FDA does not require these tests to go through  premarket FDA review. These tests are used for clinical purposes. They should not be regarded as investigational or for research. This laboratory is certified under the Clinical Laboratory Improvement Amendments (CLIA) as qualified to perform high complexity clinical laboratory testing.     Tacrolimus Level, Trough    Collection Time: 08/21/20  8:27 AM   Result Value Ref Range    Tacrolimus, Trough 6.7 5.0 - 15.0 ng/mL   CMV DNA, quantitative, PCR    Collection Time: 08/21/20  8:27 AM   Result Value Ref Range    CMV Viral Ld Not Detected Not Detected    CMV Quant      CMV Quant Log10      CMV Comment     Hemoglobin A1c    Collection Time: 08/21/20  8:27 AM   Result Value Ref Range    Hemoglobin A1C 6.3 (H) 4.8 - 5.6 %    Estimated Average Glucose 134 mg/dL   Iron Profile    Collection Time: 08/21/20  8:27 AM   Result Value Ref Range    Iron 70 65 - 175 ug/dL    TIBC 161 096 - 045 ug/dL    Iron Saturation (%) 26 %   Lipid panel    Collection Time: 08/21/20  8:27 AM   Result Value Ref Range    Triglycerides 39 0 - 150 mg/dL    Cholesterol 91 <=409 mg/dL    HDL 48 40 - 60 mg/dL    LDL Calculated 35 (L) 40 - 99 mg/dL    VLDL Cholesterol Cal 7.8 (L) 12 - 42 mg/dL    Chol/HDL Ratio 1.9 1.0 - 4.5    Non-HDL Cholesterol 43 (L) 70 - 130 mg/dL    FASTING Yes    Hepatitis B Core Antibody, total    Collection Time: 08/21/20  8:27 AM   Result Value Ref Range    Hep B Core Total Ab Nonreactive Nonreactive   Hepatitis B Surface Antigen    Collection Time: 08/21/20  8:27 AM   Result Value Ref Range    Hep B Surface Ag Nonreactive Nonreactive   Hepatitis C Antibody    Collection Time: 08/21/20  8:27 AM   Result Value Ref Range    Hepatitis C Ab Nonreactive Nonreactive   HIV Antigen/Antibody Combo    Collection Time: 08/21/20  8:27 AM   Result Value Ref Range    HIV Antigen/Antibody Combo Nonreactive Nonreactive   EBV Viral Load  (Epstein-Barr Virus DNA)    Collection Time: 08/21/20  8:27 AM   Result Value Ref Range    EBV Viral Load Result Not Detected Not Detected   CBC w/ Differential    Collection Time: 08/21/20  8:27 AM   Result Value Ref Range    WBC 5.1 3.5 - 10.5 10*9/L    RBC 3.19 (L) 4.32 - 5.72 10*12/L    HGB 9.8 (L) 13.5 - 17.5 g/dL    HCT 81.1 (L) 91.4 - 50.0 %    MCV 93.9 81.0 - 95.0 fL    MCH 30.6 26.0 - 34.0 pg    MCHC 32.6 30.0 - 36.0 g/dL    RDW 78.2 95.6 - 21.3 %    MPV 7.5 7.0 - 10.0 fL    Platelet 262 150 - 450 10*9/L    nRBC 0 <=4 /100 WBCs    Neutrophils % 56.1 %    Lymphocytes % 21.0 %    Monocytes % 9.2 %    Eosinophils %  11.9 %    Basophils % 1.8 %    Absolute Neutrophils 2.8 1.7 - 7.7 10*9/L    Absolute Lymphocytes 1.1 0.7 - 4.0 10*9/L    Absolute Monocytes 0.5 0.1 - 1.0 10*9/L    Absolute Eosinophils 0.6 0.0 - 0.7 10*9/L    Absolute Basophils 0.1 0.0 - 0.1 10*9/L   Bilirubin, Direct    Collection Time: 08/21/20  8:27 AM   Result Value Ref Range    Bilirubin, Direct 0.20 0.00 - 0.30 mg/dL         Assessment and Plan    1. Status post renal transplant. Creatinine of 1.41 today is at baseline. Tac trough 6.7 today and within goal of 6-8. Will continue current immunosuppression at this time.     2. Left calf and foot edema. Obviously concerned for DVT given his history so sent for PVLs today that were negative bilaterally. Will refer to vascular surgery/podiatry. ?secondary to venous insufficiency, though seems to have developed a bit rapidly for that. Encourage compression stockings in the meantime.    3. Weight loss. Has lost 3.5 pounds since last visit. Albumin technically normal at 3.4, but has trended down from 4.0 on 08/23/19 and 3.7 on 02/28/20. Hgb low but stable, iron stores normal. Will schedule colonoscopy. He had one 04/06/16, but given weight loss I think it is reasonable to repeat. EBV PCR negative today.    4. Hypertension. Blood pressure at goal on current regimen of lisinopril and amlodipine.     5. Type 1 diabetes. He follows with local endocrinology.     6. Skin cancer. He is followed closely by local dermatology.     7. Hyperlipidemia. Continue atorvastatin. Cholesterol panel today looks good. The 10-year ASCVD risk score Denman George DC Jr., et al., 2013) is: 13%    8. Hypercalcemia. Calcium normal at 10.0 on Sensipar.     9. Anemia. Hemoglobin low but stable, iron panel normal on oral iron. Plan for colonoscopy as above.    10. Health maintenance. He received a flu shot 08/23/2019.  He received pneumovax 13 on 09/2014 and Prevnar 23 on 01/2015. He has received two doses of Shingrix, first here on 09/08/17, second locally.  Moderna Covid-19 vaccine complete, got booster today.    We discussed that while we continue to recommend the COVID vaccine for all eligible patients, we know that kidney transplant patients do not respond as strongly due to their immunosuppression, so he should continue to follow the CDC guidelines for patients who are not vaccinated. I would also encourage all household members and close contacts that are eligible for vaccination to get vaccinated. We discussed the importance of ongoing social distancing, wearing a mask outside the home, and hand washing/sanitizing.     11. Will see patient back in 6 months, or sooner if needed.

## 2020-08-21 NOTE — Unmapped (Signed)
Assessment    Met w/ patient in [facility] today. Reviewed meds/symptoms. Any new medications? no                Pt reports no fever/cold/flu symptoms    BP: 135/70 today/ Home BP reported stable   BG: labile - increased to 14u Tujeo   Headache/Dizziness/Lightheaded: denies   Hand tremors: denies   Numbness/tingling: significant sharp pain in L foot - dorsal radiating to sole. Swelling 2+   Fevers/chills/sweats: denies   CP/SOB/palpatations: denies   Nausea/vomiting/heartburn: denies   Diarrhea/constipation: denies   UTI symptoms (burn/pain/itch/frequency/urgency/odor/color/foam): denies   Significant 2+ palpable edema - PVL orders placed and pt advised to go to Main hospital for imaging    Appetite good; reports adequate hydration. 60 oz/fluid per day.    Pt reports being well rested and getting adequate exercise despite Covid 19 quarantine. Taking care to mask, hand hygeine and minimal public activity. Offered support and guidance for this process given his immune suppressed state. Also discussed reduced covid vaccine coverage for transplant patients and importance of continuing to mask and practice safe distancing. Commented that a booster vaccine is advised.    Last Prograf taken 2100; held for this morning's labs.    No other complaints or concerns.     Referrals needed: podiatry?    Immunization status: utd - covid vaxx x2. Needs booster      Functional Score: 100     Employment/work status: works full time at [employer], but furloughed at times d/t covid pandemic

## 2020-08-21 NOTE — Unmapped (Signed)
Pt reports providing urine specimen during today's visit in lab.for blood draw.    AOBPs     Ps  127/66      63  138/71      63  139/73      64    Avg bp 135/70, 63

## 2020-08-22 DIAGNOSIS — M79672 Pain in left foot: Principal | ICD-10-CM

## 2020-08-22 DIAGNOSIS — Z940 Kidney transplant status: Principal | ICD-10-CM

## 2020-08-22 DIAGNOSIS — R600 Localized edema: Principal | ICD-10-CM

## 2020-08-22 LAB — EBV VIRAL LOAD RESULT: Lab: NOT DETECTED

## 2020-08-23 LAB — CMV DNA, QUANTITATIVE, PCR

## 2020-08-23 LAB — CMV VIRAL LD: Lab: NOT DETECTED

## 2020-09-02 NOTE — Unmapped (Signed)
[facility] Refill Coordination Note    Specialty Medication(s) to be Shipped:   Transplant: Sensipar 60mg     Other medication(s) to be shipped: No additional medications requested for fill at this time     Kollen Armenti, DOB: 07/16/1956  Phone: [phone] (home)       All above HIPAA information was verified with patient.     Was a Nurse, learning disability used for this call? No    Completed refill call assessment today to schedule patient's medication shipment from the [facility] [phone]).       Specialty medication(s) and dose(s) confirmed: Regimen is correct and unchanged.   Changes to medications: Benney reports no changes at this time.  Changes to insurance: No  Questions for the pharmacist: No    Confirmed patient received Welcome Packet with first shipment. The patient will receive a drug information handout for each medication shipped and additional FDA Medication Guides as required.       DISEASE/MEDICATION-SPECIFIC INFORMATION        N/A    SPECIALTY MEDICATION ADHERENCE     Medication Adherence    Patient reported X missed doses in the last month: 0            Sensipar 60mg : 10 days worth of medication on hand.        SHIPPING     Shipping address confirmed in Epic.     Delivery Scheduled: Yes, Expected medication delivery date: 09/09/20.     Medication will be delivered via UPS to the prescription address in Epic WAM.    [place] A Nahzir Pohle   [facility] Specialty Technician

## 2020-09-05 ENCOUNTER — Ambulatory Visit: Admit: 2020-09-05 | Discharge: 2020-09-06 | Payer: PRIVATE HEALTH INSURANCE

## 2020-09-05 ENCOUNTER — Encounter: Admit: 2020-09-05 | Discharge: 2020-09-06 | Payer: PRIVATE HEALTH INSURANCE

## 2020-09-05 DIAGNOSIS — L84 Corns and callosities: Principal | ICD-10-CM

## 2020-09-05 DIAGNOSIS — M25372 Other instability, left ankle: Principal | ICD-10-CM

## 2020-09-05 DIAGNOSIS — M25375 Other instability, left foot: Principal | ICD-10-CM

## 2020-09-05 DIAGNOSIS — B353 Tinea pedis: Principal | ICD-10-CM

## 2020-09-05 DIAGNOSIS — R600 Localized edema: Principal | ICD-10-CM

## 2020-09-05 DIAGNOSIS — B351 Tinea unguium: Principal | ICD-10-CM

## 2020-09-05 DIAGNOSIS — Z940 Kidney transplant status: Principal | ICD-10-CM

## 2020-09-05 DIAGNOSIS — M79672 Pain in left foot: Principal | ICD-10-CM

## 2020-09-05 DIAGNOSIS — M19072 Primary osteoarthritis, left ankle and foot: Principal | ICD-10-CM

## 2020-09-05 MED ORDER — KETOCONAZOLE 2 % TOPICAL CREAM
Freq: Two times a day (BID) | TOPICAL | 5 refills | 60.00000 days | Status: CP
Start: 2020-09-05 — End: 2021-09-05

## 2020-09-05 NOTE — Unmapped (Signed)
Podiatry Clinic Note:    Assessment:  Left foot second and third metatarsal base fracture, nondisplaced  Left foot and ankle instability  LLE edema  Left foot pain  Bilateral tinea pedis  Right second toe onychomycosis  T1DM, renal transplant in 2012, h/o DVT to RLE, and multiple comorbidities      Plan:  Patient was seen and evaluated in detail.  Patient's condition was discussed with the patient in detail.  Patient was given opportunity to ask questions.     Bilateral PVL venous duplex from 08/21/20 reviewed in detail showing no evidence of DVT.    Left foot XR ordered and reviewed in detail showing calcified arteries.  Patient also has subtle nondisplaced second and third metatarsal base fractures.  Once radiology report became available, I had attempted to call the patient and family multiple times but it went to voicemail and I had also left a voicemail with the patient.  Finally I was able to to discuss the x-ray results with the patient on 09/05/2020 at 6 PM.  I informed the patient to remain in the cam boot for 4 weeks until the fracture heals.  We will reevaluate with x-rays prior to next visit.    Bilateral PVL arterial duplex ordered due to XR showing calcified arteries.    Eliquis 2.5 BID ordered for prevention of DVT in leg as pt will be in cam boot.      I had prescribed left cam boot, left ankle brace and custom functional orthotics.  We will continue with cam boot for now until healed then transition to ankle brace and orthotics with supportive shoes.    Continue compression stocking to LLE.    Ketoconazole 2% prescribed to be applied to affected areas of feet and right second toenail BID.     RTC in 4 weeks.    All questions were answered and no guarantees were given or implied.  Patient expressed understanding      History of Present Illness:   Patient presents to the clinic as a referral by Dr. Lisbeth Ply. Patient is a 64 y/o male with a history of chronic venous insufficiency, HLD, kidney failure, HTN, T1DM, basal cell carcinoma of back, DVT who presents to the clinic today with a chief complaint of left foot pain and swelling that has persisted for 1.5 to 2 months. He rates the pain at a 6 or 7 out of 10. He notes that the swelling initially began in his foot but has spread to his lower leg. He is unable to wear a compression sock. 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 He has not ever tried custom orthotics. He denies any injury or trauma to his left foot. Patient denies any other complaints. Patient denies nausea, vomiting, fever, chills, SOB and chest pain.     A1c was 6.3% on 08/21/20.      Past Medical History:   Diagnosis Date   ??? Hypertension 01/13/2011   ??? Kidney transplant 09/01/2011 07/08/2010       Past Surgical History:   Procedure Laterality Date   ??? NEPHRECTOMY TRANSPLANTED ORGAN               No Known Allergies     ROS:   Constitutional:  Denies fever or chills   Eyes:  Denies change in visual acuity   HENT:  Denies nasal congestion or sore throat  Respiratory:  Denies cough or shortness of breath   Cardiovascular:  Denies chest pain or edema   GI:  Denies abdominal pain, nausea, vomiting, bloody stools or diarrhea   GU:  Denies dysuria   Musculoskeletal:  Denies back pain or joint pain   Integument:  Denies rash   Neurologic:  Denies headache, focal weakness or sensory changes   Endocrine:  Denies polyuria or polydipsia   Lymphatic:  Denies swollen glands   Psychiatric:  Denies depression or anxiety       Test Results  Imaging: Radiology studies were personally reviewed      Physical:   General Appearance: well nourished, Aox3    Vascular: DP/PT 2/4 bilaterally. CFT <3s x 10. Pedal hair is present to the dorsal foot and digits bilaterally. Varicosities noted bilaterally. Edema noted to left foot and lower extremity.  no pain with calf compression left lower extremity.    Skin is warm, smooth, and supple bilaterally. No erythema noted to the foot and ankle bilaterally.   Left foot sub 4th metatarsal head callus noted.  Second toenail of right foot is discolored and dystrophic with subungual debris.  White, macerated skin noted to bilateral web spaces.    Neurological: Gross sensation intact bilaterally. Protective sensation is intact bilaterally. Proprioception is intact to hallux bilaterally. No paresthesia noted on percussion of Tibial Nerve in the Tarsal Tunnel bilaterally. Sharp/dull sensation is intact bilaterally. Babinsky is normal bilaterally. Clonus is negative bilaterally at the ankle joint     Muscle strength is 5/5 and active motion is pain-free and symmetrical bilaterally with plantarflexion, dorsiflexion, abduction, adduction, inversion, and eversion against resistance.  Pain on palpation at the tarsometatarsal joints left foot.  Pain with dorsiflexion plantarflexion abduction and abduction of midfoot tarsometatarsal joints.      I personally spent 30 minutes face-to-face and non-face-to-face in the care of this patient, which includes all pre, intra, and post visit time on the date of service.      Scribe's Attestation: Otho Perl, DPM obtained and performed the history, physical exam and medical decision making elements that were  entered into the chart. Documentation assistance was provided by me personally, a scribe. Signed by Azalee Course, Scribe, on September 05, 2020 at 8:02 AM.     Documentation assistance provided by the Scribe. I was present during the time the encounter was recorded. The information recorded by the Scribe was done at my direction and has been reviewed and validated by me.

## 2020-09-05 NOTE — Unmapped (Addendum)
Patient Education            [facility] Locations  (accept [facility] Financial Assistance)    1.) [facility]  [address] [place].  [place], [place] [address]    2.) P: [phone] [facility]  [address]  [place], [place] [address]  P: [phone]    3.) Prosthetics and Orthotics at [facility]  [address]  [place], [place] [address]  P: [phone]    4.) Prosthetics and Orthotics across from [facility]  [address]  [address]   [place], [place] [address]  P: [phone]

## 2020-09-06 MED ORDER — APIXABAN 2.5 MG TABLET
ORAL_TABLET | Freq: Two times a day (BID) | ORAL | 0 refills | 30 days | Status: CP
Start: 2020-09-06 — End: 2020-10-06

## 2020-09-06 MED FILL — CINACALCET 60 MG TABLET: 30 days supply | Qty: 30 | Fill #7 | Status: AC

## 2020-09-06 MED FILL — CINACALCET 60 MG TABLET: ORAL | 30 days supply | Qty: 30 | Fill #7

## 2020-09-10 NOTE — Unmapped (Signed)
UNOS form

## 2020-09-11 DIAGNOSIS — R634 Abnormal weight loss: Principal | ICD-10-CM

## 2020-09-11 DIAGNOSIS — Z940 Kidney transplant status: Principal | ICD-10-CM

## 2020-09-11 NOTE — Unmapped (Signed)
Pt called to report foot pain is officially a fracture. He's been ordered to be in a boot anytime he is standing up (can take off at night).

## 2020-09-19 DIAGNOSIS — Z940 Kidney transplant status: Principal | ICD-10-CM

## 2020-09-19 DIAGNOSIS — D369 Benign neoplasm, unspecified site: Principal | ICD-10-CM

## 2020-09-19 MED ORDER — MYCOPHENOLATE SODIUM 360 MG TABLET,DELAYED RELEASE
ORAL_TABLET | Freq: Two times a day (BID) | ORAL | 3 refills | 90 days | Status: CP
Start: 2020-09-19 — End: 2021-09-19

## 2020-09-26 NOTE — Unmapped (Signed)
[facility] Refill Coordination Note    Specialty Medication(s) to be Shipped:   Transplant: Cinacalcet 60mg     Other medication(s) to be shipped: No additional medications requested for fill at this time     Steven Cooley, DOB: Apr 09, 1956  Phone: [phone] (home)       All above HIPAA information was verified with patient.     Was a Nurse, learning disability used for this call? No    Completed refill call assessment today to schedule patient's medication shipment from the [facility] [phone]).       Specialty medication(s) and dose(s) confirmed: Regimen is correct and unchanged.   Changes to medications: [name] reports no changes at this time.  Changes to insurance: No  Questions for the pharmacist: No    Confirmed patient received Welcome Packet with first shipment. The patient will receive a drug information handout for each medication shipped and additional FDA Medication Guides as required.       DISEASE/MEDICATION-SPECIFIC INFORMATION        N/A    SPECIALTY MEDICATION ADHERENCE     Medication Adherence    Patient reported X missed doses in the last month: 0  Specialty Medication: Cinacalcet 60mg   Patient is on additional specialty medications: No                Cinacalcet 60 mg: 13 days of medicine on hand        SHIPPING     Shipping address confirmed in Epic.     Delivery Scheduled: Yes, Expected medication delivery date: 10/04/20.     Medication will be delivered via Same Day Courier to the prescription address in Epic WAM.    Tera Helper   [facility] Specialty Pharmacist

## 2020-10-01 ENCOUNTER — Encounter: Admit: 2020-10-01 | Discharge: 2020-10-30 | Payer: PRIVATE HEALTH INSURANCE

## 2020-10-01 ENCOUNTER — Encounter: Admit: 2020-10-01 | Discharge: 2020-10-02 | Payer: PRIVATE HEALTH INSURANCE

## 2020-10-02 ENCOUNTER — Ambulatory Visit
Admit: 2020-10-02 | Discharge: 2020-10-03 | Payer: PRIVATE HEALTH INSURANCE | Attending: Registered" | Primary: Registered"

## 2020-10-02 NOTE — Unmapped (Signed)
[facility]   Medical Nutrition Therapy Consultation       Visit Type:    Initial Assessment    Referral Reason: :  Medical nutrition therapy for post renal transplant       Steven Cooley is a 64 y.o. male seen for medical nutrition therapy for post renal transplant with weight loss. His active problem list, medication list, allergies, family history, notes from last several encounters and lab results were reviewed.     His interim medical history is significant for renal transplant 08/22/11, type 1 diabetes, hypertension, hyperlipidemia, DVT.    Anthropometrics   Height: 176.5 cm (5' 9.5)   Weight: 64 kg (141 lb 3.2 oz)  Body mass index is 20.55 kg/m??.  Normal weight  Wt Readings from Last 5 Encounters:   10/02/20 64 kg (141 lb 3.2 oz)   08/21/20 64.9 kg (143 lb)   02/28/20 66.5 kg (146 lb 9.6 oz)   08/23/19 66.8 kg (147 lb 3.2 oz)   09/21/18 69 kg (152 lb 3.2 oz)        Usual body weight:141-147 pounds   Ideal Body Weight: 74 kg  Adjusted Body Weight: 141 pounds      Nutrition Risk Screening:       Nutrition Focused Physical Exam:     Patient does not meet AND/ASPEN criteria for malnutrition at this time (10/02/20 1307)    Frailty Index Score:   None provided    Malnutrition Screening:   Patient does not meet AND/ASPEN criteria for malnutrition at this time (10/02/20 1307)    Biochemical Data, Medical Tests and Procedures:  All pertinent labs and imaging reviewed by Piedad Climes at 11:42 AM 10/07/2020.    Lab Results   Component Value Date    CHOL 91 08/21/2020    HDL 48 08/21/2020    LDL 35 (L) 08/21/2020    NONHDL 43 (L) 08/21/2020    TRIG 39 08/21/2020       Lab Results   Component Value Date    CREATININE 1.41 (H) 08/21/2020    GFRNONAA 60 03/01/2019    GFRAA  02/08/2019      Comment:      >60       Lab Results   Component Value Date    NA 143 08/21/2020    K 4.6 (H) 08/21/2020    CL 110 (H) 08/21/2020    CO2 28.5 08/21/2020       Lab Results   Component Value Date    A1C 6.3 (H) 08/21/2020    GLU 89 08/21/2020    TSH 2.150 08/23/2019       Lab Results   Component Value Date    VITDTOTAL 32.7 02/28/2020    and   Lab Results   Component Value Date    BUN 36 (H) 08/21/2020    CREATININE 1.41 (H) 08/21/2020    GFRAA  02/08/2019      Comment:      >60    GFRNONAA 60 03/01/2019    NA 143 08/21/2020    K 4.6 (H) 08/21/2020    CL 110 (H) 08/21/2020    CO2 28.5 08/21/2020    CALCIUM 10.0 08/21/2020    PHOS 3.5 08/21/2020    GLUCOSE 98 09/01/2011    ALBUMIN 3.4 08/21/2020    PTH 148.2 (H) 03/02/2018       No results found for: [facility]    Lab Results   Component Value  Date    MG 1.6 08/21/2020    MG 1.8 02/28/2020    MG 1.5 (L) 08/23/2019    PHOS 3.5 08/21/2020    PHOS 5.3 (H) 02/28/2020    PHOS 3.6 08/23/2019       Lab Results   Component Value Date    CHOL 91 08/21/2020    HDL 48 08/21/2020    LDL 35 (L) 08/21/2020    TRIG 39 08/21/2020       Lab Results   Component Value Date    A1C 6.3 (H) 08/21/2020    A1C 6.2 (H) 02/28/2020    A1C 6.6 (H) 09/21/2018       Lab Results   Component Value Date    PROTEINUA Negative 08/21/2020    GLUCOSEU Negative 08/21/2020       Lab Results   Component Value Date    HGB 9.8 (L) 08/21/2020    HCT 30.0 (L) 08/21/2020       Lab Results   Component Value Date    IRON 70 08/21/2020    TIBC 268 08/21/2020    FERRITIN 107.0 08/12/2016     Fasting blood sugar 80 mg/dL  Typical fasting blood sugar 100-125 mg/dL  [id number] on 0/45/40 9.8% mg/dL    Medications and Vitamin/Mineral Supplementation:   All nutritionally pertinent medications reviewed on 10/02/2020.   Nutritionally pertinent medications reviewed with patient  He is  taking nutrition supplements listed below:     Current Outpatient Medications   Medication Sig Dispense Refill   ??? amLODIPine (NORVASC) 2.5 MG tablet TAKE 2 TABLETS DAILY 180 tablet 3   ??? apixaban (ELIQUIS) 2.5 mg Tab Take 1 tablet (2.5 mg total) by mouth Two (2) times a day. 60 tablet 0   ??? aspirin (ECOTRIN) 81 MG tablet Take 81 mg by mouth daily.     ??? atorvastatin (LIPITOR) 20 MG tablet TAKE 1 TABLET NIGHTLY 90 tablet 3   ??? BD INSULIN SYRINGE ULT-FINE II 1/2 mL 31 x 5/16 Syrg      ??? brinzolamide-brimonidine (SIMBRINZA) 1-0.2 % DrpS Apply 1 drop to eye two (2) times a day. Right eye     ??? cinacalcet (SENSIPAR) 60 MG tablet Take 1 tablet (60 mg total) by mouth daily. 90 tablet 3   ??? ferrous sulfate (IRON, FERROUS SULFATE,) 325 (65 FE) MG tablet Take 325 mg by mouth daily with breakfast.      ??? furosemide (LASIX) 20 MG tablet Take 1 tablet (20 mg total) by mouth every other day. 45 tablet 3   ??? insulin ASPART (NOVOLOG) 100 unit/mL injection Inject 1-10 Units under the skin Three (3) times a day before meals.      ??? insulin glargine U-300 conc (TOUJEO MAX U-300 SOLOSTAR) 300 unit/mL (3 mL) InPn Inject 14 Units under the skin daily.      ??? ketoconazole (NIZORAL) 2 % cream Apply 1 application topically Two (2) times a day. 60 g 5   ??? lisinopriL (PRINIVIL,ZESTRIL) 40 MG tablet TAKE 1 TABLET DAILY 90 tablet 3   ??? mycophenolate (MYFORTIC) 360 MG TbEC Take 1 tablet (360 mg total) by mouth Two (2) times a day. 180 tablet 3   ??? netarsudiL (RHOPRESSA) 0.02 % Drop Apply 1 drop to eye nightly. Right eye     ??? omeprazole (PRILOSEC) 20 MG capsule TAKE 1 CAPSULE DAILY AS NEEDED 90 capsule 3   ??? ONE TOUCH DELICA LANCETS 30 gauge Misc      ??? ONE TOUCH  ULTRA TEST Strp      ??? PROGRAF 1 mg capsule TAKE 3 CAPSULES (3 MG) IN THE MORNING AND 2 CAPSULES (2 MG) IN THE EVENING 360 capsule 4   ??? triamcinolone (KENALOG) 0.1 % cream      ??? VITAMIN D3 2,000 unit cap TAKE 1 CAPSULE DAILY AT 0600 90 capsule 4     No current facility-administered medications for this visit.       Nutrition History:     Dietary Restrictions: He avoids intake of cantaloupe, melon, banand  due to tongue itching.     Gastrointestinal Issues: Heartburn treated with medication    Hunger and Satiety: Endorses good appetite    Food Safety and Access: No to little issues noted.     Diet Recall:   Time Intake Breakfast Egg x 2 with grits and toast (butter) with water with flavoring or grilled cheese sandwich with chips and SF jello with water   Snack (AM) Cracker or apple or grapes   Lunch Leftovers (stew beef with potatoes or chicken and rice with cream of chicken soup or grilled pork chops +/- bar be que sauce or hamburger on bun with mustard onion ketchup with dill pickle or air fry french fries or onion rings with water or CF diet mt dew)   Snack (PM) SF jello   Dinner stew beef with potatoes or chicken and rice with cream of chicken soup or grilled pork chops +/- bar be que sauce or hamburger on bun with mustard onion ketchup with dill pickle or air fry french fries or onion rings with water or CF diet mt dew or stouffer's meals with additional cheese or arby's prime rib sandwich or rib sandwich with CF diet Mt Dew or water   Snack (HS) Skips      Food-Related History:  Snacks:  Cracker or apple or grapes  Beverages:  Water or CF diet mt dew   Dining Out: 2/21  Cooking Methods: baked   Usual Food Choices: listed above   Meal Schedule:  yes    Eating Behaviors:  Overeating: Lacks portion control with meals and snacks.         Physical Activity:  Physical activity level is light with some exercise. walking about 10,000 steps daily    Daily Estimated Nutrient Needs:  Energy:  1600-1920 kcals [  25-30 kcal using 64 kg  , actual weight  ]  Protein:   64-77 gm [  1-1.2 grams using   64 kg  , actual weight  ]  Carbohydrate:   180-216 grams based on 45% total calories  Fluid:   per MD or 2000 mL  Sodium:  <2300 mg    Nutrition Goals & Evaluation      Weight gain of at least 10 pounds by February, 2022  (New)  Continue to eat 3 meals daily with lean protein  (Met and Ongoing)  Continue to exercise at least 150 minutes weekly  (Met)  Glycemic control < 7%  (Ongoing)    Nutrition goals reviewed, and relevant barriers identified and addressed: knowledge deficit . He is evaluated to have good willingness and ability to achieve nutrition goals.     Nutrition Assessment       Current diet is not adequate or appropriate at this time for desired weight gain for post renal transplant. Current diet may be low in lean protein. Patient would benefit from nutritional changes to assist with nutrition goals.     Patient is  normal weight. Patient has been weight stable for the past month with slight weight gain during the past 3 months. Patient is aware of weight gain goals  is willing to work towards weight gain.    Blood sugar are good control at this time. Patient may benefit from glucose monitoring to determine if medication, nutrition and/or lifestyle changes are needed to manage blood sugar for glycemic goal. Patient is working hard to keep his HgbA1c < 7%.     Patient assessed to have nutrition knowledge deficit for post renal transplant   medical nutrition therapy and would benefit from on-going nutrition education and counseling.            Nutrition Intervention      - Nutrition Education: post renal transplant medical nutrition therapy diet/eating style. Education resources provided include: handouts promoting nutrition intervention , after visit summary with patient instructions  and contact information   - Meals and Snacks    Nutrition Plan:   Add peanut butter back to your daily intake  Drink at least 80 ounces of fluids daily  Eat 3 meals daily with appropriate lean protein  Exercise for 150 minutes weekly  Follow up will occur when consulted   Glycemic control < 7%    Food/Nutrition-related history, Anthropometric measurements, Biochemical data, medical tests, procedures, Nutrition-focused physical findings, Patient understanding or compliance with intervention and recommendations  and Effectiveness of nutrition interventions will be assessed at time of follow-up.         Recommendations for Clinical Team to encourage patient to:  Add peanut butter back to your daily intake  Drink at least 80 ounces of fluids daily  Eat 3 meals daily with appropriate lean protein  Exercise for 150 minutes weekly  Glycemic control < 7%       Time spent 30 minutes      Piedad Climes, RD/LDN   Clinical Dietitian  [phone]  [email or web address]

## 2020-10-02 NOTE — Unmapped (Addendum)
Add peanut butter back to your daily intake  Drink at least 80 ounces of fluids daily  Eat 3 meals daily with appropriate lean protein  Exercise for 150 minutes weekly

## 2020-10-02 NOTE — Unmapped (Signed)
Called pt regarding his FMLA paperwork.  He has an appt tomorrow.  Dr. Allena Katz wishes to reevaluate him before filling out his paperwork.  He understood and I asked him to remind Dr. Allena Katz tomorrow while he is in the clinic about his paperwork.

## 2020-10-03 ENCOUNTER — Encounter: Admit: 2020-10-03 | Discharge: 2020-10-04 | Payer: PRIVATE HEALTH INSURANCE

## 2020-10-03 ENCOUNTER — Ambulatory Visit: Admit: 2020-10-03 | Discharge: 2020-10-04 | Payer: PRIVATE HEALTH INSURANCE

## 2020-10-03 DIAGNOSIS — M25375 Other instability, left foot: Principal | ICD-10-CM

## 2020-10-03 DIAGNOSIS — M25372 Other instability, left ankle: Principal | ICD-10-CM

## 2020-10-03 DIAGNOSIS — S92325D Nondisplaced fracture of second metatarsal bone, left foot, subsequent encounter for fracture with routine healing: Principal | ICD-10-CM

## 2020-10-03 DIAGNOSIS — B353 Tinea pedis: Principal | ICD-10-CM

## 2020-10-03 DIAGNOSIS — M19072 Primary osteoarthritis, left ankle and foot: Principal | ICD-10-CM

## 2020-10-03 DIAGNOSIS — M79672 Pain in left foot: Principal | ICD-10-CM

## 2020-10-03 DIAGNOSIS — Z940 Kidney transplant status: Principal | ICD-10-CM

## 2020-10-03 NOTE — Unmapped (Signed)
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  Patient's condition was discussed with the patient in detail.  Patient was given opportunity to ask questions.     Patient will obtain BLE PVL arterial duplexes as ordered a prior visit.     Left foot XRs ordered in clinic to evaluate healing of left second and third metatarsal base nondisplaced fracture. XRs reviewed in detail with patient, demonstrating some callus formation but continued healing left foot second and third metatarsal base fracture, nondisplaced.     Given patient's delayed fracture healing, Vitamin D levels ordered today. Discussed that if his Vitamin D level continues to be low, I will recommend increased vitamin D dose. Discussed that if he continues to have a chronic nonhealing fractures, other management options include bone stimulator.     Continue on Eliquis 2.5 BID until course is completed for prevention of DVT in leg as pt will be in CAM boot.      Offloading: left CAM boot, plan to transition to left ankle brace and custom functional orthotics with supportive shoes once healed.     Continue compression stocking to LLE.    Continue applying ketoconazole 2% to affected areas of feet and right second toenail BID.     FMLA paperwork will be completed to recommend 4 additional weeks of rest.     RTC in 3-4 weeks.     All questions were answered and no guarantees were given or implied.  Patient expressed understanding      History of Present Illness:   Patient is a 64 y/o male with a history of chronic venous insufficiency, HLD, kidney failure, HTN, T1DM, basal cell carcinoma of back, DVT who returns to clinic for follow up subtle nondisplaced second and third metatarsal base fractures. He is doing well and continuing to ambulate with the CAM boot. He reports that when wearing the boot, the shooting pains up his leg have resolved and he is able to ambulate comfortably with the boot. He reports this morning that his feet were not swollen, but 1-2 hours after getting up he began to notice edema to the left foot. In terms of the initial events leading to fracture, the patient reports that he has know definitive traumatic event but that he has limited depth perception which caused him to miss step offs the curb, at which times he sustained minor injuries mostly to the back. He reports that his fracture symptoms began approximately in August. Denies a history of cancer. He takes Vitamin D 2000 mg daily. Patient denies any other complaints. Patient denies nausea, vomiting, fever, chills, SOB and chest pain.     A1c was 6.3% on 08/21/20.      Past Medical History:   Diagnosis Date   ??? Hypertension 01/13/2011   ??? Kidney transplant 09/01/2011 07/08/2010       Past Surgical History:   Procedure Laterality Date   ??? NEPHRECTOMY TRANSPLANTED ORGAN               No Known Allergies     ROS:   Constitutional:  Denies fever or chills   Eyes:  Denies change  in visual acuity   HENT:  Denies nasal congestion or sore throat   Respiratory:  Denies cough or shortness of breath   Cardiovascular:  Denies chest pain or edema   GI:  Denies abdominal pain, nausea, vomiting, bloody stools or diarrhea   GU:  Denies dysuria   Musculoskeletal:  Denies back pain or joint pain   Integument:  Denies rash   Neurologic:  Denies headache, focal weakness or sensory changes   Endocrine:  Denies polyuria or polydipsia   Lymphatic:  Denies swollen glands   Psychiatric:  Denies depression or anxiety       Test Results  Imaging: Radiology studies were personally reviewed      Physical:   General Appearance: well nourished, Aox3    Vascular: Stable and unchanged from prior.     Skin is warm, smooth, and supple bilaterally. No erythema noted to the foot and ankle bilaterally.   Left foot sub 4th metatarsal head callus noted.  Second toenail of right foot is discolored and dystrophic with subungual debris.  White, macerated skin noted to bilateral web spaces.    Neurological: Intact and unchanged from prior.     Muscle strength is 5/5 and active motion is pain-free and symmetrical bilaterally with plantarflexion, dorsiflexion, abduction, adduction, inversion, and eversion against resistance.  Pain on palpation at the tarsometatarsal joints left foot.  Pain with dorsiflexion plantarflexion abduction and abduction of midfoot tarsometatarsal joints.    I personally spent 30 minutes face-to-face and non-face-to-face in the care of this patient, which includes all pre, intra, and post visit time on the date of service.    Scribe's Attestation: Otho Perl, DPM obtained and performed the history, physical exam and medical decision making elements that were  entered into the chart. Documentation assistance was provided by me personally, a scribe. Signed by Cinda Quest, Scribe, on October 03, 2020 at 3:23 PM.     Documentation assistance provided by the Scribe. I was present during the time the encounter was recorded. The information recorded by the Scribe was done at my direction and has been reviewed and validated by me.

## 2020-10-04 MED FILL — CINACALCET 60 MG TABLET: 30 days supply | Qty: 30 | Fill #8 | Status: AC

## 2020-10-04 MED FILL — CINACALCET 60 MG TABLET: ORAL | 30 days supply | Qty: 30 | Fill #8

## 2020-10-07 NOTE — Unmapped (Signed)
Talked to pt regarding his FMLA PW.  He stated he was going to call them because he did receive an email from them on Friday stated there was an update to his case.  He will return my phone call after speaking to them today.

## 2020-10-08 NOTE — Unmapped (Signed)
Pt called asking if we could fax his FMLA paper work to [phone]. This was done

## 2020-10-25 DIAGNOSIS — K5730 Diverticulosis of large intestine without perforation or abscess without bleeding: Principal | ICD-10-CM

## 2020-10-25 DIAGNOSIS — K529 Noninfective gastroenteritis and colitis, unspecified: Principal | ICD-10-CM

## 2020-10-25 DIAGNOSIS — R634 Abnormal weight loss: Principal | ICD-10-CM

## 2020-10-25 DIAGNOSIS — Z86718 Personal history of other venous thrombosis and embolism: Principal | ICD-10-CM

## 2020-10-25 DIAGNOSIS — Z940 Kidney transplant status: Principal | ICD-10-CM

## 2020-10-25 DIAGNOSIS — I10 Essential (primary) hypertension: Principal | ICD-10-CM

## 2020-10-25 DIAGNOSIS — K6389 Other specified diseases of intestine: Principal | ICD-10-CM

## 2020-10-25 DIAGNOSIS — Z6820 Body mass index (BMI) 20.0-20.9, adult: Principal | ICD-10-CM

## 2020-10-25 DIAGNOSIS — Z7982 Long term (current) use of aspirin: Principal | ICD-10-CM

## 2020-10-25 DIAGNOSIS — Z79899 Other long term (current) drug therapy: Principal | ICD-10-CM

## 2020-10-25 DIAGNOSIS — E109 Type 1 diabetes mellitus without complications: Principal | ICD-10-CM

## 2020-10-25 MED ORDER — PEG 3350-ELECTROLYTES 236 GRAM-22.74 GRAM-6.74 GRAM-5.86 GRAM SOLUTION
Freq: Once | ORAL | 0 refills | 1.00000 days | Status: CP
Start: 2020-10-25 — End: 2020-10-25

## 2020-10-30 MED FILL — PEG 3350-ELECTROLYTES 236 GRAM-22.74 GRAM-6.74 GRAM-5.86 GRAM SOLUTION: ORAL | 1 days supply | Qty: 4000 | Fill #0

## 2020-10-30 MED FILL — PEG 3350-ELECTROLYTES 236 GRAM-22.74 GRAM-6.74 GRAM-5.86 GRAM SOLUTION: 1 days supply | Qty: 4000 | Fill #0 | Status: AC

## 2020-10-30 NOTE — Unmapped (Signed)
Podiatry Clinic Note:    Assessment:  Left foot second and third metatarsal base fracture, nondisplaced, closed  Left foot and ankle instability  LLE edema  Left foot pain  Bilateral tinea pedis  Right second toe onychomycosis  T1DM, renal transplant in 2012, h/o DVT to RLE, and multiple comorbidities      Plan:  Patient was seen and evaluated in detail.  Patient's condition was discussed with the patient in detail.  Patient was given opportunity to ask questions.     Left foot XR ordered and reviewed in detail with patient showing healed fractures. Radiology impression still pending at time of patient's check-out.    Clinically, patient has no pain in his left foot with walking barefoot. No pain with palpation in the area as well.      Offloading: Transition from left CAM boot to supportive shoe with custom functional orthotic with carbon fiber plate. Patient to wear carbon fiber plate with orthotic in his work boot as well. Patient understood and agreed.     Continue compression stocking to LLE.    Continue applying ketoconazole 2% to affected areas of feet and right second toenail BID.     RTC PRN.    All questions were answered and no guarantees were given or implied.  Patient expressed understanding    Addendum 11/05/20- telephone call- xray report reviewed with patient in detail. He had his work exam yesterday and he passed. He will go back to work from 11/22. He understands he is at high risk for refracture in the same area. He will wear carbon fiber plate in his shoes to protect his foot. All questions were answered.       History of Present Illness:   Patient is a 64 y/o male with a history of chronic venous insufficiency, HLD, kidney failure, HTN, T1DM, basal cell carcinoma of back, DVT who returns to clinic regarding left foot second and third metatarsal base fracture, nondisplaced with slow healing. Patient has not begun walking without a CAM boot. He denies any pain since using the CAM boot.t. Mr. Widmayer endorses wearing compression stockings. He also notes that he has off from work during this next week due to needing to undergo colonoscopy. Patient denies any other complaints. Patient denies nausea, vomiting, fever, chills, SOB and chest pain. No pain in his left foot at rest either.     A1c was 6.3% on 08/21/20.      Past Medical History:   Diagnosis Date   ??? Hypertension 01/13/2011   ??? Kidney transplant 09/01/2011 07/08/2010       Past Surgical History:   Procedure Laterality Date   ??? NEPHRECTOMY TRANSPLANTED ORGAN               No Known Allergies     ROS:   Constitutional:  Denies fever or chills   Eyes:  Denies change in visual acuity   HENT:  Denies nasal congestion or sore throat   Respiratory:  Denies cough or shortness of breath   Cardiovascular:  Denies chest pain or edema   GI:  Denies abdominal pain, nausea, vomiting, bloody stools or diarrhea   GU:  Denies dysuria   Musculoskeletal:  Denies back pain or joint pain   Integument:  Denies rash   Neurologic:  Denies headache, focal weakness or sensory changes   Endocrine:  Denies polyuria or polydipsia   Lymphatic:  Denies swollen glands   Psychiatric:  Denies depression or anxiety  Test Results  Imaging: Radiology studies were personally reviewed. Almost healed fracture at base 2nd and 3rd metatarsal left foot.       Physical:   General Appearance: well nourished, Aox3    Vascular: Stable and unchanged.     Skin is warm, smooth, and supple bilaterally. No erythema noted to the foot and ankle bilaterally.   Second toenail of right foot is discolored and dystrophic with subungual debris.  White, macerated skin noted to bilateral web spaces.    Neurological: Stable and intact.    MSK: 5/5 strength to the major muscle groups of the bilateral lower extremities.  No pain on palpation at the tarsometatarsal joints left foot.   No pain at base of 2nd and 3rd metatarsal left foot.   No pain with range of motion of TMTJ       I personally spent 20 minutes face-to-face and non-face-to-face in the care of this patient, which includes all pre, intra, and post visit time on the date of service.      Scribe's Attestation: Otho Perl, DPM obtained and performed the history, physical exam and medical decision making elements that were  entered into the chart. Documentation assistance was provided by me personally, a scribe. Signed by Azalee Course, Scribe, on October 31, 2020 at 7:58 AM.     Documentation assistance provided by the Scribe. I was present during the time the encounter was recorded. The information recorded by the Scribe was done at my direction and has been reviewed and validated by me.Marland Kitchen

## 2020-10-31 ENCOUNTER — Ambulatory Visit: Admit: 2020-10-31 | Discharge: 2020-11-01 | Payer: PRIVATE HEALTH INSURANCE

## 2020-10-31 ENCOUNTER — Encounter: Admit: 2020-10-31 | Discharge: 2020-11-01 | Payer: PRIVATE HEALTH INSURANCE

## 2020-10-31 DIAGNOSIS — S92325D Nondisplaced fracture of second metatarsal bone, left foot, subsequent encounter for fracture with routine healing: Principal | ICD-10-CM

## 2020-10-31 DIAGNOSIS — S92335D Nondisplaced fracture of third metatarsal bone, left foot, subsequent encounter for fracture with routine healing: Principal | ICD-10-CM

## 2020-11-04 ENCOUNTER — Other Ambulatory Visit: Payer: Self-pay

## 2020-11-04 ENCOUNTER — Other Ambulatory Visit
Admission: RE | Admit: 2020-11-04 | Discharge: 2020-11-04 | Disposition: A | Discharge status: Home / Self Care | Payer: BC Managed Care – PPO | Attending: Nephrology | Admitting: Nephrology

## 2020-11-04 ENCOUNTER — Encounter
Admit: 2020-11-04 | Discharge: 2020-11-04 | Payer: PRIVATE HEALTH INSURANCE | Attending: Nephrology | Primary: Nephrology

## 2020-11-04 DIAGNOSIS — X58XXXA Exposure to other specified factors, initial encounter: Secondary | ICD-10-CM | POA: Diagnosis not present

## 2020-11-04 DIAGNOSIS — E559 Vitamin D deficiency, unspecified: Secondary | ICD-10-CM | POA: Diagnosis not present

## 2020-11-04 DIAGNOSIS — Z789 Other specified health status: Secondary | ICD-10-CM | POA: Insufficient documentation

## 2020-11-04 DIAGNOSIS — T8610 Unspecified complication of kidney transplant: Secondary | ICD-10-CM | POA: Diagnosis not present

## 2020-11-04 DIAGNOSIS — B259 Cytomegaloviral disease, unspecified: Secondary | ICD-10-CM | POA: Diagnosis not present

## 2020-11-04 DIAGNOSIS — D899 Disorder involving the immune mechanism, unspecified: Secondary | ICD-10-CM | POA: Insufficient documentation

## 2020-11-04 DIAGNOSIS — D631 Anemia in chronic kidney disease: Secondary | ICD-10-CM | POA: Diagnosis not present

## 2020-11-04 DIAGNOSIS — Z114 Encounter for screening for human immunodeficiency virus [HIV]: Secondary | ICD-10-CM | POA: Insufficient documentation

## 2020-11-04 DIAGNOSIS — Z79899 Other long term (current) drug therapy: Secondary | ICD-10-CM | POA: Insufficient documentation

## 2020-11-04 DIAGNOSIS — Z9483 Pancreas transplant status: Secondary | ICD-10-CM | POA: Diagnosis not present

## 2020-11-04 DIAGNOSIS — Z940 Kidney transplant status: Secondary | ICD-10-CM | POA: Diagnosis present

## 2020-11-04 DIAGNOSIS — N390 Urinary tract infection, site not specified: Secondary | ICD-10-CM | POA: Insufficient documentation

## 2020-11-04 LAB — BASIC METABOLIC PANEL
Anion gap: 8 (ref 5–15)
BUN: 42 mg/dL — ABNORMAL HIGH (ref 8–23)
CO2: 26 mmol/L (ref 22–32)
Calcium: 9.4 mg/dL (ref 8.9–10.3)
Chloride: 104 mmol/L (ref 98–111)
Creatinine, Ser: 1.39 mg/dL — ABNORMAL HIGH (ref 0.61–1.24)
GFR, Estimated: 57 mL/min — ABNORMAL LOW (ref >60)
Glucose, Bld: 93 mg/dL (ref 70–99)
Potassium: 4.2 mmol/L (ref 3.5–5.1)
Sodium: 138 mmol/L (ref 135–145)

## 2020-11-04 LAB — MAGNESIUM: Magnesium: 1.5 mg/dL — ABNORMAL LOW (ref 1.7–2.4)

## 2020-11-04 LAB — CBC WITH DIFFERENTIAL/PLATELET
Abs Immature Granulocytes: 0.02 10*3/uL (ref 0.00–0.07)
Basophils Absolute: 0.1 10*3/uL (ref 0.0–0.1)
Basophils Relative: 1 %
Eosinophils Absolute: 0.6 10*3/uL — ABNORMAL HIGH (ref 0.0–0.5)
Eosinophils Relative: 11 %
HCT: 32.9 % — ABNORMAL LOW (ref 39.0–52.0)
Hemoglobin: 10.6 g/dL — ABNORMAL LOW (ref 13.0–17.0)
Immature Granulocytes: 0 %
Lymphocytes Relative: 26 %
Lymphs Abs: 1.5 10*3/uL (ref 0.7–4.0)
MCH: 31.2 pg (ref 26.0–34.0)
MCHC: 32.2 g/dL (ref 30.0–36.0)
MCV: 96.8 fL (ref 80.0–100.0)
Monocytes Absolute: 0.5 10*3/uL (ref 0.1–1.0)
Monocytes Relative: 9 %
Neutro Abs: 3 10*3/uL (ref 1.7–7.7)
Neutrophils Relative %: 53 %
Platelets: 213 10*3/uL (ref 150–400)
RBC: 3.4 MIL/uL — ABNORMAL LOW (ref 4.22–5.81)
RDW: 13.1 % (ref 11.5–15.5)
WBC: 5.7 10*3/uL (ref 4.0–10.5)
nRBC: 0 % (ref 0.0–0.2)

## 2020-11-04 LAB — PHOSPHORUS: Phosphorus: 3.7 mg/dL (ref 2.5–4.6)

## 2020-11-04 NOTE — Unmapped (Signed)
Patient called to inform us that he received prep solution with instructions on Saturday morning but had eaten a Salad with cucumbers on Friday night but have strictly followed, the low residue diet since Saturday.  Informed patient to continue to follow low residue diet and start Clear liquid diet tomorrow. Patient states he is at grocery store as we speak trying to buy items for his diet tomorrow. Is white grape juice O.K.?  Reviewed with patient liquid diet that they should follow on the day prior to procedure. What I can Eat/Drink on a Liquid Diet   ??? Gelatin/Jello, but not red, blue or purple in color.  ??? Fat-free milk, fat-free yogurt, fat-free pudding (plain/vanilla flavors only)  ??? Fat-free, clear broth (strain all pulp and fat, no beans or veggies)  ??? Clear Liquids (nothing that is red, blue, or purple color): water, sports drinks, sodas (Coke or Pepsi are okay), clear juice with no pulp, coffee and tea (sugar is okay), [place] ice    What I need to AVOID on a Liquid Diet  ??? Do NOT drink anything with particles, oil or fat. Strain first.  ??? No red-, blue-, or purple-colored drinks (e.g., sports drinks, sodas, juices)  ??? No orange or pineapple juice, or any juice with pulp  ??? No fiber supplements    Reviewed with patient that prior to procedure, on day of procedure, should only have water, prep solution, black coffee, and black tea. Patient advised OK to have sugar in back coffee and black tea.     Due to early appointment time of 0930 , advised starting prep at 3 pm instead of 5 pm and continuing as tolerated until complete. Can take a break halfway through for an hour. Nothing to eat or drink following the prep other than black coffee, black tea, or water.     This Nurse reviewed medication list with patient and informed him to hold supplements (D 3 and Iron) the day before and the day off his procedure. Not to take lisinopril the day off his procedure and to take 1/2 of his long acting diabetes medication the night before his procedure and do not take long or short acting the day of his procedure. He informed me that he takes his long acting insulin in the morning and from prior colonoscopy experience he would be taking 1/2 of his long acting insulin to prevent a low blood sugar and the need for IV glucose prior to starting his procedure which is what happen at the last colonoscopy, per patient.    Patient verbalized understanding and has no further questions at this time.    This nurse discussed this insulin issue with triage nurse [place]. [place] reports she will call patient to discuss pre-proceudre insulin management.

## 2020-11-04 NOTE — Unmapped (Signed)
Standing orders sent per pt request and mailers request sent to [facility] via [employer] and email

## 2020-11-04 NOTE — Unmapped (Signed)
Patient has colonoscopy scheduled on 11/06/2020 at 0930. Reviewed with patient that [facility] GI Procedures recommends patients take one-half normal dose of long-acting insulin on the day prior to procedure. Patient reports that he plans to take one-half normal dose of Toujeo on 11/05/2020. Reviewed with patient that he should hold diabetic medications, including Novolog and Toujeo, on the morning of 11/06/2020. Reviewed with patient that endoscopist will provide instructions on when to resume daily medications after colonoscopy completed.     Reviewed with patient OK to take Myfortic and Prograf by 0730 on day of procedure. Patient reports that he usually takes AM medications around 0900 and will take Myfortic and Prograf after procedure completed on 11/06/2020.    Reviewed with patient OK to have water, black coffee, and black tea in AM on day of procedure until 0730, then NPO. Patient advised OK to have sugar in black coffee and black tea.     Patient reports that when he arrived for previous colonoscopy, at facility in [place], his blood sugar was 50. Reviewed with patient OK to take glucose tablets or apple juice if needed in AM on day of procedure until 0730. Patient reports that he does not have apple juice but has white grape juice. Patient advised OK to substitute white grape juice for apple juice.    Patient reports that he has not taken Eliquis in over one month.    Patient verbalized understanding and denies further questions at this time. Patient advised to call back with questions/concerns.

## 2020-11-05 NOTE — Unmapped (Signed)
Patient reports that his wife used lemonade powder to flavor bowel prep with her colonoscopy. Reviewed with patient OK to add two pitcher packs of Crystal Light in lemonade, orange, or iced tea flavors if flavoring preferred. Patient advised that these instructions were sent to his myChart. Patient verbalized understanding and denies further questions at this time. Patient advised to call back with questions/concerns.

## 2020-11-06 ENCOUNTER — Encounter: Admit: 2020-11-06 | Discharge: 2020-11-06 | Payer: PRIVATE HEALTH INSURANCE

## 2020-11-06 DIAGNOSIS — Z940 Kidney transplant status: Principal | ICD-10-CM

## 2020-11-06 DIAGNOSIS — Z7982 Long term (current) use of aspirin: Principal | ICD-10-CM

## 2020-11-06 DIAGNOSIS — K6389 Other specified diseases of intestine: Principal | ICD-10-CM

## 2020-11-06 DIAGNOSIS — K5730 Diverticulosis of large intestine without perforation or abscess without bleeding: Principal | ICD-10-CM

## 2020-11-06 DIAGNOSIS — Z6820 Body mass index (BMI) 20.0-20.9, adult: Principal | ICD-10-CM

## 2020-11-06 DIAGNOSIS — Z86718 Personal history of other venous thrombosis and embolism: Principal | ICD-10-CM

## 2020-11-06 DIAGNOSIS — I10 Essential (primary) hypertension: Principal | ICD-10-CM

## 2020-11-06 DIAGNOSIS — Z79899 Other long term (current) drug therapy: Principal | ICD-10-CM

## 2020-11-06 DIAGNOSIS — K529 Noninfective gastroenteritis and colitis, unspecified: Principal | ICD-10-CM

## 2020-11-06 DIAGNOSIS — R634 Abnormal weight loss: Principal | ICD-10-CM

## 2020-11-06 DIAGNOSIS — E109 Type 1 diabetes mellitus without complications: Principal | ICD-10-CM

## 2020-11-06 LAB — TACROLIMUS LEVEL, TROUGH: TACROLIMUS, TROUGH: 7.9 ng/mL (ref 5.0–15.0)

## 2020-11-06 MED ADMIN — propofol (DIPRIVAN) infusion 10 mg/mL: INTRAVENOUS | @ 14:00:00 | Stop: 2020-11-06

## 2020-11-06 MED ADMIN — propofoL (DIPRIVAN) injection: INTRAVENOUS | @ 15:00:00 | Stop: 2020-11-06

## 2020-11-06 MED ADMIN — propofoL (DIPRIVAN) injection: INTRAVENOUS | @ 14:00:00 | Stop: 2020-11-06

## 2020-11-06 MED ADMIN — lactated Ringers infusion: 10 mL/h | INTRAVENOUS | @ 14:00:00 | Stop: 2020-11-06

## 2020-11-06 MED ADMIN — lidocaine (XYLOCAINE) 20 mg/mL (2 %) injection: INTRAVENOUS | @ 14:00:00 | Stop: 2020-11-06

## 2020-11-07 MED ORDER — LISINOPRIL 40 MG TABLET
ORAL_TABLET | 3 refills | 0 days
Start: 2020-11-07 — End: ?

## 2020-11-11 NOTE — Unmapped (Signed)
11/29: disregard below note, spoke with patient today -ef    The [facility] has made a third and final attempt to reach this patient to refill the following medication:cinacalcet.      We have left voicemails on the following phone numbers: [phone], [phone], [phone] and have left voicemail with patient's wife at the following phone numbers: [phone].    Dates contacted: 11/15, 11/19, 11/29  Last scheduled delivery: 10/22    The patient may be at risk of non-compliance with this medication. The patient should call the [facility] at [phone] (option 4) to refill medication.    Thad Ranger   [facility] Specialty Pharmacist

## 2020-11-11 NOTE — Unmapped (Signed)
[facility] Clinical Assessment & Refill Coordination Note    Steven Cooley, DOB: Oct 29, 1956  Phone: [phone] (home)     All above HIPAA information was verified with patient.     Was a Nurse, learning disability used for this call? No    Specialty Medication(s):   cinacalcet 60mg      Current Outpatient Medications   Medication Sig Dispense Refill   ??? amLODIPine (NORVASC) 2.5 MG tablet TAKE 2 TABLETS DAILY 180 tablet 3   ??? aspirin (ECOTRIN) 81 MG tablet Take 81 mg by mouth daily.     ??? atorvastatin (LIPITOR) 20 MG tablet TAKE 1 TABLET NIGHTLY 90 tablet 3   ??? BD INSULIN SYRINGE ULT-FINE II 1/2 mL 31 x 5/16 Syrg      ??? brinzolamide-brimonidine (SIMBRINZA) 1-0.2 % DrpS Apply 1 drop to eye two (2) times a day. Right eye     ??? cinacalcet (SENSIPAR) 60 MG tablet Take 1 tablet (60 mg total) by mouth daily. 90 tablet 3   ??? ferrous sulfate (IRON, FERROUS SULFATE,) 325 (65 FE) MG tablet Take 325 mg by mouth daily with breakfast.      ??? furosemide (LASIX) 20 MG tablet Take 1 tablet (20 mg total) by mouth every other day. 45 tablet 3   ??? insulin ASPART (NOVOLOG) 100 unit/mL injection Inject 1-10 Units under the skin Three (3) times a day before meals.      ??? insulin glargine U-300 conc (TOUJEO MAX U-300 SOLOSTAR) 300 unit/mL (3 mL) InPn Inject 14 Units under the skin daily.      ??? ketoconazole (NIZORAL) 2 % cream Apply 1 application topically Two (2) times a day. 60 g 5   ??? lisinopriL (PRINIVIL,ZESTRIL) 40 MG tablet TAKE 1 TABLET DAILY 90 tablet 3   ??? mycophenolate (MYFORTIC) 360 MG TbEC Take 1 tablet (360 mg total) by mouth Two (2) times a day. 180 tablet 3   ??? netarsudiL (RHOPRESSA) 0.02 % Drop Apply 1 drop to eye nightly. Right eye     ??? omeprazole (PRILOSEC) 20 MG capsule TAKE 1 CAPSULE DAILY AS NEEDED 90 capsule 3   ??? ONE TOUCH DELICA LANCETS 30 gauge Misc      ??? ONE TOUCH ULTRA TEST Strp      ??? PROGRAF 1 mg capsule TAKE 3 CAPSULES (3 MG) IN THE MORNING AND 2 CAPSULES (2 MG) IN THE EVENING 360 capsule 4   ??? triamcinolone (KENALOG) 0.1 % cream      ??? VITAMIN D3 2,000 unit cap TAKE 1 CAPSULE DAILY AT 0600 90 capsule 4     No current facility-administered medications for this visit.        Changes to medications: Maijor reports no changes at this time.    No Known Allergies    Changes to allergies: No    SPECIALTY MEDICATION ADHERENCE     cinacalcet 60mg   : 5 days of medicine on hand       Medication Adherence    Patient reported X missed doses in the last month: 0  Specialty Medication: cinacalcet 60mg           Specialty medication(s) dose(s) confirmed: Regimen is correct and unchanged.     Are there any concerns with adherence? No    Adherence counseling provided? Not needed    CLINICAL MANAGEMENT AND INTERVENTION      Clinical Benefit Assessment:    Do you feel the medicine is effective or helping your condition? Yes  Clinical Benefit counseling provided? Not needed    Adverse Effects Assessment:    Are you experiencing any side effects? No    Are you experiencing difficulty administering your medicine? No    Quality of Life Assessment:    How many days over the past month did your transplant  keep you from your normal activities? For example, brushing your teeth or getting up in the morning. 0    Have you discussed this with your provider? Not needed    Therapy Appropriateness:    Is therapy appropriate? Yes, therapy is appropriate and should be continued    DISEASE/MEDICATION-SPECIFIC INFORMATION      N/A    PATIENT SPECIFIC NEEDS     - Does the patient have any physical, cognitive, or cultural barriers? No    - Is the patient high risk? Yes, patient is taking a REMS drug. Medication is dispensed in compliance with REMS program but not from [facility]sc    - Does the patient require a Care Management Plan? No     - Does the patient require physician intervention or other additional services (i.e. nutrition, smoking cessation, social work)? No      SHIPPING     Specialty Medication(s) to be Shipped:   cinacalcet 60mg     Other medication(s) to be shipped: no other medications requested     Changes to insurance: No    Delivery Scheduled: Yes, Expected medication delivery date: 11/13/2020.     Medication will be delivered via Next Day Courier to the confirmed prescription address in [facility].    The patient will receive a drug information handout for each medication shipped and additional FDA Medication Guides as required.  Verified that patient has previously received a Conservation officer, historic buildings.    All of the patient's questions and concerns have been addressed.    Thad Ranger   [facility] Specialty Pharmacist

## 2020-11-12 MED FILL — CINACALCET 60 MG TABLET: 30 days supply | Qty: 30 | Fill #9 | Status: AC

## 2020-11-12 MED FILL — CINACALCET 60 MG TABLET: ORAL | 30 days supply | Qty: 30 | Fill #9

## 2020-12-03 NOTE — Unmapped (Signed)
[facility] Refill Coordination Note    Specialty Medication(s) to be Shipped:   Transplant: Cinacalcet 60mg     Other medication(s) to be shipped: No additional medications requested for fill at this time     Steven Cooley, DOB: Dec 03, 1956  Phone: [phone] (home)       All above HIPAA information was verified with patient.     Was a Nurse, learning disability used for this call? No    Completed refill call assessment today to schedule patient's medication shipment from the [facility] [phone]).       Specialty medication(s) and dose(s) confirmed: Regimen is correct and unchanged.   Changes to medications: Wesley reports no changes at this time.  Changes to insurance: No  Questions for the pharmacist: No    Confirmed patient received Welcome Packet with first shipment. The patient will receive a drug information handout for each medication shipped and additional FDA Medication Guides as required.       DISEASE/MEDICATION-SPECIFIC INFORMATION        N/A    SPECIALTY MEDICATION ADHERENCE     Medication Adherence    Patient reported X missed doses in the last month: 0  Specialty Medication: Cinacalcet 60mg   Patient is on additional specialty medications: No        Cinacalcet 60 mg: 8 days of medicine on hand     SHIPPING     Shipping address confirmed in Epic.     Delivery Scheduled: Yes, Expected medication delivery date: 12/10/2020.     Medication will be delivered via Next Day Courier to the prescription address in Epic [facility].    [name]   [facility] Specialty Technician

## 2020-12-09 MED FILL — CINACALCET 60 MG TABLET: ORAL | 30 days supply | Qty: 30 | Fill #10

## 2020-12-09 MED FILL — CINACALCET 60 MG TABLET: 30 days supply | Qty: 30 | Fill #10 | Status: AC

## 2020-12-11 NOTE — Unmapped (Signed)
rec'd call from pt that he is coming up short on his Rx for prograf due to his 5mg  daily Rx.  He said specialty pharmacy delivery not coming until 12/21/20 but he will be out of meds this weekend.    I called [facility] for Rx to fill in, in discussion with pharmacist and being on the phone with the for 28 minutes, pt was approved for a month supply, over ride applied, no cost to pt.    Rx called in under Dr. Carlene Coria for prograf, 3mg  am, 2mg  pm #150 , no refill.

## 2020-12-18 DIAGNOSIS — E1021 Type 1 diabetes mellitus with diabetic nephropathy: Secondary | ICD-10-CM | POA: Diagnosis not present

## 2020-12-18 DIAGNOSIS — E10649 Type 1 diabetes mellitus with hypoglycemia without coma: Secondary | ICD-10-CM | POA: Diagnosis not present

## 2020-12-18 DIAGNOSIS — E10641 Type 1 diabetes mellitus with hypoglycemia with coma: Secondary | ICD-10-CM | POA: Diagnosis not present

## 2021-01-08 DIAGNOSIS — Z940 Kidney transplant status: Principal | ICD-10-CM

## 2021-01-08 MED ORDER — CINACALCET 60 MG TABLET
ORAL_TABLET | Freq: Every day | ORAL | 3 refills | 90.00000 days | Status: CP
Start: 2021-01-08 — End: 2022-01-08
  Filled 2021-01-09: qty 30, 30d supply, fill #0

## 2021-01-08 NOTE — Unmapped (Signed)
[facility] Refill Coordination Note    Specialty Medication(s) to be Shipped:   Transplant: Sensipar 60mg     Other medication(s) to be shipped: No additional medications requested for fill at this time     Steven Cooley, DOB: 1956/05/19  Phone: [phone] (home)       All above HIPAA information was verified with patient.     Was a Nurse, learning disability used for this call? No    Completed refill call assessment today to schedule patient's medication shipment from the [facility] [phone]).       Specialty medication(s) and dose(s) confirmed: Regimen is correct and unchanged.   Changes to medications: [name] reports no changes at this time.  Changes to insurance: No  Questions for the pharmacist: No    Confirmed patient received Welcome Packet with first shipment. The patient will receive a drug information handout for each medication shipped and additional FDA Medication Guides as required.       DISEASE/MEDICATION-SPECIFIC INFORMATION        N/A    SPECIALTY MEDICATION ADHERENCE     Medication Adherence    Patient reported X missed doses in the last month: 0  Specialty Medication: SENSIPAR (CINACALCET) 60MG    Patient is on additional specialty medications: No  Informant: patient  Confirmed plan for next specialty medication refill: delivery by pharmacy  Refills needed for supportive medications: yes, ordered or provider notified          Refill Coordination    Has the Patients' Contact Information Changed: No  Is the Shipping Address Different: No           SENSIPAR 60 mg: 7 days of medicine on hand         SHIPPING     Shipping address confirmed in Epic.     Delivery Scheduled: Yes, Expected medication delivery date: 1/28.  However, Rx request for refills was sent to the provider as there are none remaining.     Medication will be delivered via Next Day Courier to the prescription address in Epic WAM.    Jolene Schimke   [facility] Specialty Technician

## 2021-01-17 MED ORDER — OMEPRAZOLE 20 MG CAPSULE,DELAYED RELEASE
ORAL_CAPSULE | 3 refills | 0 days | Status: CP
Start: 2021-01-17 — End: ?

## 2021-01-23 DIAGNOSIS — D2339 Other benign neoplasm of skin of other parts of face: Secondary | ICD-10-CM | POA: Diagnosis not present

## 2021-01-23 DIAGNOSIS — L578 Other skin changes due to chronic exposure to nonionizing radiation: Secondary | ICD-10-CM | POA: Diagnosis not present

## 2021-01-23 DIAGNOSIS — D485 Neoplasm of uncertain behavior of skin: Secondary | ICD-10-CM | POA: Diagnosis not present

## 2021-01-23 DIAGNOSIS — Z872 Personal history of diseases of the skin and subcutaneous tissue: Secondary | ICD-10-CM | POA: Diagnosis not present

## 2021-01-23 DIAGNOSIS — Z86018 Personal history of other benign neoplasm: Secondary | ICD-10-CM | POA: Diagnosis not present

## 2021-01-23 DIAGNOSIS — Z85828 Personal history of other malignant neoplasm of skin: Secondary | ICD-10-CM | POA: Diagnosis not present

## 2021-02-04 ENCOUNTER — Other Ambulatory Visit
Admission: RE | Admit: 2021-02-04 | Discharge: 2021-02-04 | Disposition: A | Discharge status: Home / Self Care | Payer: BC Managed Care – PPO | Attending: Nephrology | Admitting: Nephrology

## 2021-02-04 ENCOUNTER — Encounter
Admit: 2021-02-04 | Discharge: 2021-02-04 | Payer: PRIVATE HEALTH INSURANCE | Attending: Nephrology | Primary: Nephrology

## 2021-02-04 DIAGNOSIS — Z9483 Pancreas transplant status: Secondary | ICD-10-CM | POA: Insufficient documentation

## 2021-02-04 DIAGNOSIS — E559 Vitamin D deficiency, unspecified: Secondary | ICD-10-CM | POA: Diagnosis not present

## 2021-02-04 DIAGNOSIS — D899 Disorder involving the immune mechanism, unspecified: Secondary | ICD-10-CM | POA: Insufficient documentation

## 2021-02-04 DIAGNOSIS — Z940 Kidney transplant status: Secondary | ICD-10-CM | POA: Diagnosis not present

## 2021-02-04 DIAGNOSIS — Z79899 Other long term (current) drug therapy: Secondary | ICD-10-CM | POA: Insufficient documentation

## 2021-02-04 DIAGNOSIS — Z09 Encounter for follow-up examination after completed treatment for conditions other than malignant neoplasm: Secondary | ICD-10-CM | POA: Insufficient documentation

## 2021-02-04 DIAGNOSIS — Z114 Encounter for screening for human immunodeficiency virus [HIV]: Secondary | ICD-10-CM | POA: Insufficient documentation

## 2021-02-04 DIAGNOSIS — Z789 Other specified health status: Secondary | ICD-10-CM | POA: Diagnosis not present

## 2021-02-04 DIAGNOSIS — D631 Anemia in chronic kidney disease: Secondary | ICD-10-CM | POA: Diagnosis not present

## 2021-02-04 DIAGNOSIS — N390 Urinary tract infection, site not specified: Secondary | ICD-10-CM | POA: Insufficient documentation

## 2021-02-04 DIAGNOSIS — E1129 Type 2 diabetes mellitus with other diabetic kidney complication: Secondary | ICD-10-CM | POA: Diagnosis not present

## 2021-02-04 DIAGNOSIS — B259 Cytomegaloviral disease, unspecified: Secondary | ICD-10-CM | POA: Insufficient documentation

## 2021-02-04 LAB — CBC WITH DIFFERENTIAL/PLATELET
Abs Immature Granulocytes: 0.02 10*3/uL (ref 0.00–0.07)
Basophils Absolute: 0.1 10*3/uL (ref 0.0–0.1)
Basophils Relative: 2 %
Eosinophils Absolute: 0.8 10*3/uL — ABNORMAL HIGH (ref 0.0–0.5)
Eosinophils Relative: 14 %
HCT: 31.2 % — ABNORMAL LOW (ref 39.0–52.0)
Hemoglobin: 9.4 g/dL — ABNORMAL LOW (ref 13.0–17.0)
Immature Granulocytes: 0 %
Lymphocytes Relative: 22 %
Lymphs Abs: 1.3 10*3/uL (ref 0.7–4.0)
MCH: 30.1 pg (ref 26.0–34.0)
MCHC: 30.1 g/dL (ref 30.0–36.0)
MCV: 100 fL (ref 80.0–100.0)
Monocytes Absolute: 0.6 10*3/uL (ref 0.1–1.0)
Monocytes Relative: 10 %
Neutro Abs: 3 10*3/uL (ref 1.7–7.7)
Neutrophils Relative %: 52 %
Platelets: 195 10*3/uL (ref 150–400)
RBC: 3.12 MIL/uL — ABNORMAL LOW (ref 4.22–5.81)
RDW: 13.1 % (ref 11.5–15.5)
WBC: 5.8 10*3/uL (ref 4.0–10.5)
nRBC: 0 % (ref 0.0–0.2)

## 2021-02-04 LAB — BASIC METABOLIC PANEL
Anion gap: 7 (ref 5–15)
BUN: 44 mg/dL — ABNORMAL HIGH (ref 8–23)
CO2: 26 mmol/L (ref 22–32)
Calcium: 9.3 mg/dL (ref 8.9–10.3)
Chloride: 107 mmol/L (ref 98–111)
Creatinine, Ser: 1.44 mg/dL — ABNORMAL HIGH (ref 0.61–1.24)
GFR, Estimated: 55 mL/min — ABNORMAL LOW (ref >60)
Glucose, Bld: 118 mg/dL — ABNORMAL HIGH (ref 70–99)
Potassium: 4.7 mmol/L (ref 3.5–5.1)
Sodium: 140 mmol/L (ref 135–145)

## 2021-02-04 LAB — MAGNESIUM: Magnesium: 1.7 mg/dL (ref 1.7–2.4)

## 2021-02-04 LAB — PHOSPHORUS: Phosphorus: 3.1 mg/dL (ref 2.5–4.6)

## 2021-02-05 NOTE — Unmapped (Signed)
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     Was a Nurse, learning disability used for this call? No    Completed refill call assessment today to schedule patient's medication shipment from the [facility] [phone]).       Specialty medication(s) and dose(s) confirmed: Regimen is correct and unchanged.   Changes to medications: Steven Cooley reports no changes at this time.  Changes to insurance: No  Questions for the pharmacist: No    Confirmed patient received Welcome Packet with first shipment. The patient will receive a drug information handout for each medication shipped and additional FDA Medication Guides as required.       DISEASE/MEDICATION-SPECIFIC INFORMATION        N/A    SPECIALTY MEDICATION ADHERENCE     Medication Adherence    Patient reported X missed doses in the last month: 0        Sensipar 60mg  10 days worth of medication on hand.        SHIPPING     Shipping address confirmed in Epic.     Delivery Scheduled: Yes, Expected medication delivery date: 02/12/21.     Medication will be delivered via Same Day Courier to the prescription address in Epic WAM.    Steven Cooley   [facility] Specialty Technician

## 2021-02-07 DIAGNOSIS — Z940 Kidney transplant status: Principal | ICD-10-CM

## 2021-02-08 LAB — TACROLIMUS LEVEL, TROUGH: TACROLIMUS, TROUGH: 9.3 ng/mL (ref 5.0–15.0)

## 2021-02-11 MED FILL — CINACALCET 60 MG TABLET: ORAL | 30 days supply | Qty: 30 | Fill #1

## 2021-02-14 DIAGNOSIS — Z1159 Encounter for screening for other viral diseases: Principal | ICD-10-CM

## 2021-02-14 DIAGNOSIS — Z79899 Other long term (current) drug therapy: Principal | ICD-10-CM

## 2021-02-14 DIAGNOSIS — Z940 Kidney transplant status: Principal | ICD-10-CM

## 2021-02-14 DIAGNOSIS — D84821 Immunocompromised state due to drug therapy (CMS-HCC): Principal | ICD-10-CM

## 2021-02-18 DIAGNOSIS — C4491 Basal cell carcinoma of skin, unspecified: Secondary | ICD-10-CM | POA: Diagnosis not present

## 2021-02-18 NOTE — Unmapped (Signed)
Returned call to patient to remind him of appt this Friday 3/11 at 8:30a.

## 2021-02-20 NOTE — Unmapped (Signed)
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 He presents today for a routine follow up. His post transplant course has been complicated by a DVT and has right lower extremity. he was treated with Coumadin for a duration of time after the clot, but has been off that for quite some time. Patient does not have evidence of donor specific antibodies as of 02/28/20. His creatinine had trended up from 1.2-1.4 to 1.6-1.8 following initiation of lisinopril and titrating dose upwards, though more recently has been back to 1.2-1.5.    At his visit 08/23/19, he had concerns about weight loss (was 147 down from 152 on 09/21/18). He had some unexpected time out from work and some stressful events in his life (his father passed away and his mother is in a nursing home). To cope with this, he started walking and he was walking 3-4 miles daily, sometimes up to 6-7 miles. He had not really increased his eating to account for the additional calorie burn. At visit 02/28/19, weight stable at 146.    Interval history since last seen 08/21/2020    At his visit 08/21/20 he noted some left foot swelling for the last 1.5 months. It was initially intermittent but was now constant. It improved some with elevation, and had progressed into his calf. The previous couple of weeks it had been associated with pain over the top of his foot and occasionally on the bottom. More of a burning pain. He reported pain 0-2 but occasionally got up to 8. Noted that it was worse at the end of the day. He had some chronic right LE edema after a DVT that was unchanged. Denied SOB. Sent for PVLs, negative for DVT bilaterally. Had lost an additional 3 pounds from previous visit.    He saw podiatry 09/05/20 for further evaluation of the left foot swelling, was found to have per their note subtle nondisplaced second and third metatarsal base fractures. Recommended that he remain in the cam boot for 4 weeks. Also ordered PVL arterial duplex due to calcified arteries seen on plain film. Prescribed Eliquis 2.5 mg BID for DVT prevention while in the boot. At follow up 10/03/20 was noted to have delayed fracture healing and vitamin D level ordered. He was to remain in the cam boot until healed, recommended an additional 4 weeks of rest. At visit 10/31/20 X-ray showed fractures healed and he was transitioned from the boot into supportive shoe with a custom orthotic with plans to follow up prn. I don't see that the arterial duplex was ever performed.    Saw Greta Doom 10/02/20 for dietary recommendations for weight gain. He had a colonoscopy 11/06/20 that showed diverticulosis, congested mucosa in the terminal ileum was biopsied and showed mild chronic active ileitis. Recommended repeat colonoscopy in 5 years. Following the colonoscopy I did get a message from Dr. 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    He presents today for follow up.     He has not had labs in our system since 08/21/20. Do see labs in Care Everywhere from 11/04/20 and 02/04/21, but no tacrolimus levels.    His left foot is much better and he is walking on it without pain. He is not doing 10-12 hour shifts right now  which is helping with that. He tried the shoe inserts, but felt like those made it worse so he stopped using them and is just wearing work boots at his job.    He had a skin cancer removed from his nose/face 02/18/21, and is having a precancerous lesion removed from his back 03/04/21.     He has not been checking his blood pressure at home, has not yet taken his BP meds today. Mild lower extremity edema is stable.    He specifically denies fever, myalgias, upper respiratory/GI symptoms, or change in taste/smell.    Last dose of Prograf: 9 pm.    Review of Systems    Otherwise on review of systems patient denies fever or chills, chest pain, SOB, PND or orthopnea.  Denies N/V/abdominal pain. No dysuria, hematuria or difficulty voiding. Bowel movements normal.  Denies joint pain or rash. All other systems are reviewed and are negative.    Medications    Current Outpatient Medications   Medication Sig Dispense Refill   ??? amLODIPine (NORVASC) 2.5 MG tablet TAKE 2 TABLETS DAILY 180 tablet 3   ??? aspirin (ECOTRIN) 81 MG tablet Take 81 mg by mouth daily.     ??? atorvastatin (LIPITOR) 20 MG tablet TAKE 1 TABLET NIGHTLY 90 tablet 3   ??? BD INSULIN SYRINGE ULT-FINE II 1/2 mL 31 x 5/16 Syrg      ??? brinzolamide-brimonidine (SIMBRINZA) 1-0.2 % DrpS Apply 1 drop to eye two (2) times a day. Right eye     ??? cinacalcet (SENSIPAR) 60 MG tablet Take 1 tablet (60 mg total) by mouth daily. 90 tablet 3   ??? ferrous sulfate (IRON, FERROUS SULFATE,) 325 (65 FE) MG tablet Take 325 mg by mouth daily with breakfast.      ??? furosemide (LASIX) 20 MG tablet Take 1 tablet (20 mg total) by mouth every other day. 45 tablet 3   ??? insulin ASPART (NOVOLOG) 100 unit/mL injection Inject 1-10 Units under the skin Three (3) times a day before meals.      ??? insulin glargine U-300 conc (TOUJEO MAX U-300 SOLOSTAR) 300 unit/mL (3 mL) InPn Inject 14 Units under the skin daily.      ??? ketoconazole (NIZORAL) 2 % cream Apply 1 application topically Two (2) times a day. 60 g 5   ??? lisinopriL (PRINIVIL,ZESTRIL) 40 MG tablet TAKE 1 TABLET DAILY 90 tablet 3   ??? mycophenolate (MYFORTIC) 360 MG TbEC Take 1 tablet (360 mg total) by mouth Two (2) times a day. 180 tablet 3   ??? netarsudiL (RHOPRESSA) 0.02 % Drop Apply 1 drop to eye nightly. Right eye     ??? omeprazole (PRILOSEC) 20 MG capsule TAKE 1 CAPSULE DAILY AS NEEDED 90 capsule 3   ??? ONE TOUCH DELICA LANCETS 30 gauge Misc      ??? ONE TOUCH ULTRA TEST Strp      ??? PROGRAF 1 mg capsule TAKE 3 CAPSULES (3 MG) IN THE MORNING AND 2 CAPSULES (2 MG) IN THE EVENING 360 capsule 4   ??? triamcinolone (KENALOG) 0.1 % cream      ??? VITAMIN D3 2,000 unit cap TAKE 1 CAPSULE DAILY AT 0600 90 capsule 4     No current facility-administered medications for this visit.       Physical Exam    BP 159/77 (BP Site: R Arm, BP Position: Sitting, BP Cuff Size: Small)  - Pulse 63  - Temp 36.3 ??C (97.3 ??F) (Temporal)  - Ht 177.8  cm (5' 10)  - Wt 65.5 kg (144 lb 6.4 oz)  - BMI 20.72 kg/m??   General: Patient is a pleasant male in no apparent distress.  Eyes: Sclera anicteric.  ENT: Mask in place.  Neck: Supple without LAD/JVD/bruits.  Lungs: Clear to auscultation bilaterally, no wheezes/rales/rhonchi.  Cardiovascular: Regular rate and rhythm without murmurs, rubs or gallops.  Abdomen: Soft, notender/nondistended. Positive bowel sounds. No tenderness over the graft.  Extremities: 1+ bilateral lower extremity edema.  Skin: Without rash.  Neurological: Grossly nonfocal.  Psychiatric: Mood and affect appropriate.      Laboratory Results    Recent Results (from the past 170 hour(s))   Phosphorus Level    Collection Time: 02/21/21  8:07 AM   Result Value Ref Range    Phosphorus 3.7 2.4 - 5.1 mg/dL   Magnesium Level    Collection Time: 02/21/21  8:07 AM   Result Value Ref Range    Magnesium 2.0 1.6 - 2.6 mg/dL   Urinalysis    Collection Time: 02/21/21  8:07 AM   Result Value Ref Range    Color, UA Yellow     Clarity, UA Clear     Specific Gravity, UA 1.015 1.005 - 1.030    pH, UA 6.0 5.0 - 9.0    Leukocyte Esterase, UA Negative Negative    Nitrite, UA Negative Negative    Protein, UA Trace (A) Negative    Glucose, UA Negative Negative    Ketones, UA Negative Negative    Urobilinogen, UA 0.2 mg/dL 0.2 - 2.0 mg/dL    Bilirubin, UA Negative Negative    Blood, UA Small (A) Negative    RBC, UA 4 (H) <3 /HPF    WBC, UA <1 <2 /HPF    Squam Epithel, UA <1 0 - 5 /HPF    Bacteria, UA None Seen None Seen /HPF   Protein/Creatinine Ratio, Urine    Collection Time: 02/21/21  8:07 AM   Result Value Ref Range    Creat U 68.4 Undefined mg/dL    Protein, Ur 16.1 mg/dL Protein/Creatinine Ratio, Urine 0.675 Undefined   Comprehensive Metabolic Panel    Collection Time: 02/21/21  8:07 AM   Result Value Ref Range    Sodium 144 135 - 145 mmol/L    Potassium 5.3 (H) 3.4 - 4.8 mmol/L    Chloride 114 (H) 98 - 107 mmol/L    Anion Gap 3 (L) 5 - 14 mmol/L    CO2 27.0 20.0 - 31.0 mmol/L    BUN 39 (H) 9 - 23 mg/dL    Creatinine 0.96 (H) 0.60 - 1.10 mg/dL    BUN/Creatinine Ratio 29     EGFR CKD-EPI Non-African American, Male 56 (L) >=60 mL/min/1.21m2    EGFR CKD-EPI African American, Male 54 >=60 mL/min/1.44m2    Glucose 162 70 - 179 mg/dL    Calcium 04.5 8.7 - 40.9 mg/dL    Albumin 3.6 3.4 - 5.0 g/dL    Total Protein 6.5 5.7 - 8.2 g/dL    Total Bilirubin 0.4 0.3 - 1.2 mg/dL    AST 19 <=81 U/L    ALT 14 10 - 49 U/L    Alkaline Phosphatase 76 46 - 116 U/L   Tacrolimus Level, Trough    Collection Time: 02/21/21  8:07 AM   Result Value Ref Range    Tacrolimus, Trough 8.5 5.0 - 15.0 ng/mL   Hemoglobin A1c    Collection Time: 02/21/21  8:07 AM   Result Value Ref  Range    Hemoglobin A1C 6.3 (H) 4.8 - 5.6 %    Estimated Average Glucose 134 mg/dL   COVID Spike IgG    Collection Time: 02/21/21  8:07 AM   Result Value Ref Range    SARS-COV-2 IgG Semi-Quant <1.85 <13.00 AU/mL    SARS-CoV-2 SPIKE IgG ANTIBODY Negative Negative   CBC w/ Differential    Collection Time: 02/21/21  8:07 AM   Result Value Ref Range    WBC 6.8 3.6 - 11.2 10*9/L    RBC 3.48 (L) 4.26 - 5.60 10*12/L    HGB 10.7 (L) 12.9 - 16.5 g/dL    HCT 25.9 (L) 56.3 - 48.0 %    MCV 94.3 77.6 - 95.7 fL    MCH 30.7 25.9 - 32.4 pg    MCHC 32.5 32.0 - 36.0 g/dL    RDW 87.5 64.3 - 32.9 %    MPV 8.1 6.8 - 10.7 fL    Platelet 174 150 - 450 10*9/L    nRBC 0 <=4 /100 WBCs    Neutrophils % 48.9 %    Lymphocytes % 19.2 %    Monocytes % 9.1 %    Eosinophils % 21.7 %    Basophils % 1.1 %    Absolute Neutrophils 3.3 1.8 - 7.8 10*9/L    Absolute Lymphocytes 1.3 1.1 - 3.6 10*9/L    Absolute Monocytes 0.6 0.3 - 0.8 10*9/L    Absolute Eosinophils 1.5 (H) 0.0 - 0.5 10*9/L    Absolute Basophils 0.1 0.0 - 0.1 10*9/L         Assessment and Plan    1. Status post renal transplant. Creatinine of 1.35 today is at baseline. His tacrolimus trough of 8.5 is an 11 hour trough and likely within goal of 6-8. 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 He has no symptoms and I am hesitant to put him on azathioprine given skin cancer. In fact I am recommending that he convert from tacrolimus to sirolimus. Once he is stable on sirolimus could consider reducing dose of Myfortic from 360 to 180 mg BID.    2. Skin cancer. Discussed risk/benefit of converting from tacrolimus to sirolimus and patient willing to make the switch. He understands he will need to get frequent drug levels until he is stable. Will consult with transplant pharmacy regarding plan.    3. Left foot fracture. Clinically improved.    4. Weight loss. Recent colonoscopy reassuring without evidence of malignancy. Weight stable today.    5. Hypertension. Blood pressure elevated in clinic though he has not yet taken his meds. Continue lisinopril and amlodipine, will have him monitor at home and let us know if it remains elevated.     6. Type 1 diabetes. He follows with local endocrinology. Hgb A1c today is at goal, 6.3%.    7. Hyperlipidemia. Continue atorvastatin. Lipid panel at goal 08/21/20. The 10-year ASCVD risk score Denman George DC Montez Hageman., et al., 2013) is: 17%.    8. Hypercalcemia. Calcium normal at 10.1 on Sensipar.     9. Anemia. Hemoglobin low but stable at 10.7, iron panel normal on 08/21/20, continue oral iron.     10. Health maintenance. He received a flu shot 08/23/2019.  He received pneumovax 13 on 09/2014 and Prevnar 23 on 01/2015. He has received two doses of Shingrix, first here on 09/08/17, second locally.  He has had 3 doses of the Moderna Covid-19 vaccine, given fourth dose  today 02/21/21. His spike IgG antibody is negative so will offer him Evusheld (2 weeks after fourth dose).    We discussed that while we continue to recommend the COVID vaccine for all eligible patients, we know that kidney transplant patients do not respond as strongly due to their immunosuppression, so he should continue to follow the CDC guidelines for patients who are not vaccinated. I would also encourage all household members and close contacts that are eligible for vaccination to get vaccinated. We discussed the importance of ongoing social distancing, wearing a mask outside the home, and hand washing/sanitizing.     11. Will see patient back in 6 months, or sooner if needed. Follow labs closely while transitioning from tacrolimus to sirolimus.

## 2021-02-21 ENCOUNTER — Encounter
Admit: 2021-02-21 | Discharge: 2021-02-21 | Payer: PRIVATE HEALTH INSURANCE | Attending: Nephrology | Primary: Nephrology

## 2021-02-21 ENCOUNTER — Encounter: Admit: 2021-02-21 | Discharge: 2021-02-21 | Payer: PRIVATE HEALTH INSURANCE

## 2021-02-21 DIAGNOSIS — Z1159 Encounter for screening for other viral diseases: Principal | ICD-10-CM

## 2021-02-21 DIAGNOSIS — Z940 Kidney transplant status: Principal | ICD-10-CM

## 2021-02-21 DIAGNOSIS — D84821 Immunocompromised state due to drug therapy (CMS-HCC): Principal | ICD-10-CM

## 2021-02-21 DIAGNOSIS — Z79899 Other long term (current) drug therapy: Principal | ICD-10-CM

## 2021-02-21 DIAGNOSIS — D849 Immunodeficiency, unspecified: Principal | ICD-10-CM

## 2021-02-21 DIAGNOSIS — N1831 Stage 3a chronic kidney disease (CMS-HCC): Principal | ICD-10-CM

## 2021-02-21 DIAGNOSIS — I10 Essential (primary) hypertension: Principal | ICD-10-CM

## 2021-02-21 DIAGNOSIS — R634 Abnormal weight loss: Principal | ICD-10-CM

## 2021-02-21 DIAGNOSIS — N2581 Secondary hyperparathyroidism of renal origin: Principal | ICD-10-CM

## 2021-02-21 DIAGNOSIS — Z4822 Encounter for aftercare following kidney transplant: Secondary | ICD-10-CM | POA: Diagnosis not present

## 2021-02-21 DIAGNOSIS — Z7982 Long term (current) use of aspirin: Secondary | ICD-10-CM | POA: Diagnosis not present

## 2021-02-21 DIAGNOSIS — Z86718 Personal history of other venous thrombosis and embolism: Secondary | ICD-10-CM | POA: Diagnosis not present

## 2021-02-21 DIAGNOSIS — R600 Localized edema: Secondary | ICD-10-CM | POA: Diagnosis not present

## 2021-02-21 DIAGNOSIS — Z794 Long term (current) use of insulin: Secondary | ICD-10-CM | POA: Diagnosis not present

## 2021-02-21 DIAGNOSIS — Z23 Encounter for immunization: Secondary | ICD-10-CM | POA: Diagnosis not present

## 2021-02-21 DIAGNOSIS — E785 Hyperlipidemia, unspecified: Secondary | ICD-10-CM | POA: Diagnosis not present

## 2021-02-21 DIAGNOSIS — Z8781 Personal history of (healed) traumatic fracture: Secondary | ICD-10-CM | POA: Diagnosis not present

## 2021-02-21 DIAGNOSIS — Z0184 Encounter for antibody response examination: Secondary | ICD-10-CM | POA: Diagnosis not present

## 2021-02-21 DIAGNOSIS — D649 Anemia, unspecified: Secondary | ICD-10-CM | POA: Diagnosis not present

## 2021-02-21 DIAGNOSIS — C4490 Unspecified malignant neoplasm of skin, unspecified: Secondary | ICD-10-CM | POA: Diagnosis not present

## 2021-02-21 DIAGNOSIS — E1021 Type 1 diabetes mellitus with diabetic nephropathy: Secondary | ICD-10-CM | POA: Diagnosis not present

## 2021-02-21 LAB — COMPREHENSIVE METABOLIC PANEL
ALBUMIN: 3.6 g/dL (ref 3.4–5.0)
ALKALINE PHOSPHATASE: 76 U/L (ref 46–116)
ALT (SGPT): 14 U/L (ref 10–49)
ANION GAP: 3 mmol/L — ABNORMAL LOW (ref 5–14)
AST (SGOT): 19 U/L (ref <=34)
BILIRUBIN TOTAL: 0.4 mg/dL (ref 0.3–1.2)
BLOOD UREA NITROGEN: 39 mg/dL — ABNORMAL HIGH (ref 9–23)
BUN / CREAT RATIO: 29
CALCIUM: 10.1 mg/dL (ref 8.7–10.4)
CHLORIDE: 114 mmol/L — ABNORMAL HIGH (ref 98–107)
CO2: 27 mmol/L (ref 20.0–31.0)
CREATININE: 1.35 mg/dL — ABNORMAL HIGH
EGFR CKD-EPI AA MALE: 65 mL/min/{1.73_m2} (ref >=60)
EGFR CKD-EPI NON-AA MALE: 56 mL/min/{1.73_m2} — ABNORMAL LOW (ref >=60)
GLUCOSE RANDOM: 162 mg/dL (ref 70–179)
POTASSIUM: 5.3 mmol/L — ABNORMAL HIGH (ref 3.4–4.8)
PROTEIN TOTAL: 6.5 g/dL (ref 5.7–8.2)
SODIUM: 144 mmol/L (ref 135–145)

## 2021-02-21 LAB — HEMOGLOBIN A1C
ESTIMATED AVERAGE GLUCOSE: 134 mg/dL
HEMOGLOBIN A1C: 6.3 % — ABNORMAL HIGH (ref 4.8–5.6)

## 2021-02-21 LAB — MAGNESIUM: MAGNESIUM: 2 mg/dL (ref 1.6–2.6)

## 2021-02-21 LAB — URINALYSIS
BACTERIA: NONE SEEN /HPF
BILIRUBIN UA: NEGATIVE
GLUCOSE UA: NEGATIVE
KETONES UA: NEGATIVE
LEUKOCYTE ESTERASE UA: NEGATIVE
NITRITE UA: NEGATIVE
PH UA: 6 (ref 5.0–9.0)
RBC UA: 4 /HPF — ABNORMAL HIGH (ref <3)
SPECIFIC GRAVITY UA: 1.015 (ref 1.005–1.030)
SQUAMOUS EPITHELIAL: <1 /HPF (ref 0–5)
UROBILINOGEN UA: 0.2
WBC UA: <1 /HPF (ref <2)

## 2021-02-21 LAB — PROTEIN / CREATININE RATIO, URINE
CREATININE, URINE: 68.4 mg/dL
PROTEIN URINE: 46.2 mg/dL
PROTEIN/CREAT RATIO, URINE: 0.675

## 2021-02-21 LAB — PHOSPHORUS: PHOSPHORUS: 3.7 mg/dL (ref 2.4–5.1)

## 2021-02-21 LAB — CBC W/ AUTO DIFF
BASOPHILS ABSOLUTE COUNT: 0.1 10*9/L (ref 0.0–0.1)
BASOPHILS RELATIVE PERCENT: 1.1 %
EOSINOPHILS ABSOLUTE COUNT: 1.5 10*9/L — ABNORMAL HIGH (ref 0.0–0.5)
EOSINOPHILS RELATIVE PERCENT: 21.7 %
HEMATOCRIT: 32.8 % — ABNORMAL LOW (ref 39.0–48.0)
HEMOGLOBIN: 10.7 g/dL — ABNORMAL LOW (ref 12.9–16.5)
LYMPHOCYTES ABSOLUTE COUNT: 1.3 10*9/L (ref 1.1–3.6)
LYMPHOCYTES RELATIVE PERCENT: 19.2 %
MEAN CORPUSCULAR HEMOGLOBIN CONC: 32.5 g/dL (ref 32.0–36.0)
MEAN CORPUSCULAR HEMOGLOBIN: 30.7 pg (ref 25.9–32.4)
MEAN CORPUSCULAR VOLUME: 94.3 fL (ref 77.6–95.7)
MEAN PLATELET VOLUME: 8.1 fL (ref 6.8–10.7)
MONOCYTES ABSOLUTE COUNT: 0.6 10*9/L (ref 0.3–0.8)
MONOCYTES RELATIVE PERCENT: 9.1 %
NEUTROPHILS ABSOLUTE COUNT: 3.3 10*9/L (ref 1.8–7.8)
NEUTROPHILS RELATIVE PERCENT: 48.9 %
NUCLEATED RED BLOOD CELLS: 0 /100{WBCs} (ref <=4)
PLATELET COUNT: 174 10*9/L (ref 150–450)
RED BLOOD CELL COUNT: 3.48 10*12/L — ABNORMAL LOW (ref 4.26–5.60)
RED CELL DISTRIBUTION WIDTH: 13.4 % (ref 12.2–15.2)
WBC ADJUSTED: 6.8 10*9/L (ref 3.6–11.2)

## 2021-02-21 LAB — COVID SPIKE IGG
SARS-COV-2 IGG SEMI-QUANT: <1.85 [AU]/ml (ref <13.00)
SARS-COV-2 SPIKE IGG ANTIBODY: NEGATIVE

## 2021-02-21 LAB — TACROLIMUS LEVEL, TROUGH: TACROLIMUS, TROUGH: 8.5 ng/mL (ref 5.0–15.0)

## 2021-02-21 NOTE — Unmapped (Addendum)
Assessment    Met w/ patient and wife in [facility] today. Reviewed meds/symptoms. Any new medications? no                Pt reports no fever/cold/flu symptoms    BP: 159/77 today/ Home BP reported stable   BG: stable   Headache/Dizziness/Lightheaded: denies   Hand tremors: denies   Numbness/tingling: denies   Fevers/chills/sweats: denies   CP/SOB/palpatations: denies   Nausea/vomiting/heartburn: denies    Diarrhea/constipation: denies   UTI symptoms (burn/pain/itch/frequency/urgency/odor/color/foam): denies   No visible or palpable edema    Appetite good; reports adequate hydration.     Pt reports being well rested and getting adequate exercise despite Covid 19 quarantine. Taking care to mask, hand hygeine and minimal public activity. Offered support and guidance for this process given his immune suppressed state. Also discussed reduced covid vaccine coverage for transplant patients and importance of continuing to mask and practice safe distancing. Commented that a booster vaccine may be advised in the near future.    Last Tac taken 2100; held for this morning's labs.    No other complaints or concerns.     Referrals needed: none     Pt Follow up w/ Derm - remove precancerous concern on back, complete therapy     Immunization status: covid x 3, flu, needs covid #4, Pneumo23 - Added Evusheld therapy plan r/t Covid Spike IgG result      Functional Score: 100     Employment/work status: works full time

## 2021-02-21 NOTE — Unmapped (Signed)
AOBP:Right arm  small cuff   Average:159/77 Pulse:63  1st reading:160/79 Pulse:64  2nd reading:158/77 Pulse:62  3rd reading:158/76 Pulse:63

## 2021-02-22 LAB — CMV DNA, QUANTITATIVE, PCR: CMV VIRAL LD: NOT DETECTED

## 2021-02-22 NOTE — Unmapped (Signed)
Addended by: Devona Konig on: 02/21/2021 04:48 PM     Modules accepted: Orders

## 2021-02-24 DIAGNOSIS — E10649 Type 1 diabetes mellitus with hypoglycemia without coma: Secondary | ICD-10-CM | POA: Diagnosis not present

## 2021-02-24 DIAGNOSIS — E10641 Type 1 diabetes mellitus with hypoglycemia with coma: Secondary | ICD-10-CM | POA: Diagnosis not present

## 2021-02-24 DIAGNOSIS — E1021 Type 1 diabetes mellitus with diabetic nephropathy: Secondary | ICD-10-CM | POA: Diagnosis not present

## 2021-02-27 DIAGNOSIS — Z940 Kidney transplant status: Principal | ICD-10-CM

## 2021-02-27 MED ORDER — SIROLIMUS 1 MG TABLET
ORAL_TABLET | Freq: Every day | ORAL | 11 refills | 30 days | Status: CP
Start: 2021-02-27 — End: 2022-02-27

## 2021-02-27 NOTE — Unmapped (Signed)
Planned switch from Tacrolimus to Sirolimus - called pt with plan for script to be sent to [facility] and transition from Tac->Rapa as follows:    1) Decrease Tacrolimus to 1mg  BID  2) Start Sirolimus 2mg  DAILY  3) Check Sirolimus level in 3-5 days.  4) Stop Tacrolimus when Sirolimus levels >3    Pt acknowledged change and will call me when he receives Sirolimus script in the mail. These instructions were also sent to the patient via MyChart for clarification. He let me know he will take the medication at 4pm daily so that he can more easily get labs drawn around his work schedule. Updated lab orders sent to [facility] per protocol.

## 2021-03-04 DIAGNOSIS — D235 Other benign neoplasm of skin of trunk: Secondary | ICD-10-CM | POA: Diagnosis not present

## 2021-03-04 NOTE — Unmapped (Signed)
This patient has been disenrolled from the [facility] specialty pharmacy services due to Patient no longer has any specialty medications being filled at [facility]. Tera Helper  [facility] Specialty Pharmacist

## 2021-03-11 MED FILL — CINACALCET 60 MG TABLET: ORAL | 30 days supply | Qty: 30 | Fill #2

## 2021-03-12 ENCOUNTER — Other Ambulatory Visit
Admission: RE | Admit: 2021-03-12 | Discharge: 2021-03-12 | Disposition: A | Discharge status: Home / Self Care | Payer: BC Managed Care – PPO | Attending: Nephrology | Admitting: Nephrology

## 2021-03-12 ENCOUNTER — Other Ambulatory Visit: Payer: Self-pay

## 2021-03-12 ENCOUNTER — Encounter
Admit: 2021-03-12 | Discharge: 2021-03-12 | Payer: PRIVATE HEALTH INSURANCE | Attending: Nephrology | Primary: Nephrology

## 2021-03-12 DIAGNOSIS — B259 Cytomegaloviral disease, unspecified: Secondary | ICD-10-CM | POA: Diagnosis not present

## 2021-03-12 DIAGNOSIS — Z09 Encounter for follow-up examination after completed treatment for conditions other than malignant neoplasm: Secondary | ICD-10-CM | POA: Insufficient documentation

## 2021-03-12 DIAGNOSIS — Z940 Kidney transplant status: Secondary | ICD-10-CM | POA: Insufficient documentation

## 2021-03-12 DIAGNOSIS — E559 Vitamin D deficiency, unspecified: Secondary | ICD-10-CM | POA: Diagnosis not present

## 2021-03-12 DIAGNOSIS — D631 Anemia in chronic kidney disease: Secondary | ICD-10-CM | POA: Diagnosis not present

## 2021-03-12 DIAGNOSIS — Z114 Encounter for screening for human immunodeficiency virus [HIV]: Secondary | ICD-10-CM | POA: Insufficient documentation

## 2021-03-12 DIAGNOSIS — Z789 Other specified health status: Secondary | ICD-10-CM | POA: Insufficient documentation

## 2021-03-12 DIAGNOSIS — Z9483 Pancreas transplant status: Secondary | ICD-10-CM | POA: Diagnosis not present

## 2021-03-12 DIAGNOSIS — E1129 Type 2 diabetes mellitus with other diabetic kidney complication: Secondary | ICD-10-CM | POA: Insufficient documentation

## 2021-03-12 DIAGNOSIS — N390 Urinary tract infection, site not specified: Secondary | ICD-10-CM | POA: Insufficient documentation

## 2021-03-12 DIAGNOSIS — D899 Disorder involving the immune mechanism, unspecified: Secondary | ICD-10-CM | POA: Insufficient documentation

## 2021-03-12 DIAGNOSIS — Z79899 Other long term (current) drug therapy: Secondary | ICD-10-CM | POA: Diagnosis not present

## 2021-03-12 LAB — CBC WITH DIFFERENTIAL/PLATELET
Abs Immature Granulocytes: 0.02 10*3/uL (ref 0.00–0.07)
Basophils Absolute: 0.1 10*3/uL (ref 0.0–0.1)
Basophils Relative: 1 %
Eosinophils Absolute: 0.8 10*3/uL — ABNORMAL HIGH (ref 0.0–0.5)
Eosinophils Relative: 13 %
HCT: 28.4 % — ABNORMAL LOW (ref 39.0–52.0)
Hemoglobin: 8.7 g/dL — ABNORMAL LOW (ref 13.0–17.0)
Immature Granulocytes: 0 %
Lymphocytes Relative: 19 %
Lymphs Abs: 1.1 10*3/uL (ref 0.7–4.0)
MCH: 30.6 pg (ref 26.0–34.0)
MCHC: 30.6 g/dL (ref 30.0–36.0)
MCV: 100 fL (ref 80.0–100.0)
Monocytes Absolute: 0.6 10*3/uL (ref 0.1–1.0)
Monocytes Relative: 10 %
Neutro Abs: 3.2 10*3/uL (ref 1.7–7.7)
Neutrophils Relative %: 57 %
Platelets: 193 10*3/uL (ref 150–400)
RBC: 2.84 MIL/uL — ABNORMAL LOW (ref 4.22–5.81)
RDW: 13.4 % (ref 11.5–15.5)
WBC: 5.7 10*3/uL (ref 4.0–10.5)
nRBC: 0 % (ref 0.0–0.2)

## 2021-03-12 LAB — BASIC METABOLIC PANEL
Anion gap: 5 (ref 5–15)
BUN: 43 mg/dL — ABNORMAL HIGH (ref 8–23)
CO2: 23 mmol/L (ref 22–32)
Calcium: 8.4 mg/dL — ABNORMAL LOW (ref 8.9–10.3)
Chloride: 110 mmol/L (ref 98–111)
Creatinine, Ser: 1.43 mg/dL — ABNORMAL HIGH (ref 0.61–1.24)
GFR, Estimated: 55 mL/min — ABNORMAL LOW (ref >60)
Glucose, Bld: 231 mg/dL — ABNORMAL HIGH (ref 70–99)
Potassium: 5.1 mmol/L (ref 3.5–5.1)
Sodium: 138 mmol/L (ref 135–145)

## 2021-03-12 LAB — PHOSPHORUS: Phosphorus: 3.8 mg/dL (ref 2.5–4.6)

## 2021-03-12 LAB — MAGNESIUM: Magnesium: 1.8 mg/dL (ref 1.7–2.4)

## 2021-03-17 DIAGNOSIS — Z940 Kidney transplant status: Principal | ICD-10-CM

## 2021-03-18 MED ORDER — SIROLIMUS 1 MG TABLET
ORAL_TABLET | Freq: Every day | ORAL | 11 refills | 30 days | Status: CP
Start: 2021-03-18 — End: 2022-03-18

## 2021-03-24 DIAGNOSIS — E10649 Type 1 diabetes mellitus with hypoglycemia without coma: Secondary | ICD-10-CM | POA: Diagnosis not present

## 2021-03-24 DIAGNOSIS — E10641 Type 1 diabetes mellitus with hypoglycemia with coma: Secondary | ICD-10-CM | POA: Diagnosis not present

## 2021-03-24 DIAGNOSIS — E109 Type 1 diabetes mellitus without complications: Secondary | ICD-10-CM | POA: Diagnosis not present

## 2021-03-24 DIAGNOSIS — E1021 Type 1 diabetes mellitus with diabetic nephropathy: Secondary | ICD-10-CM | POA: Diagnosis not present

## 2021-03-25 ENCOUNTER — Other Ambulatory Visit
Admission: RE | Admit: 2021-03-25 | Discharge: 2021-03-25 | Disposition: A | Discharge status: Home / Self Care | Payer: BC Managed Care – PPO | Attending: Nephrology | Admitting: Nephrology

## 2021-03-25 ENCOUNTER — Other Ambulatory Visit: Payer: Self-pay

## 2021-03-25 ENCOUNTER — Encounter: Admit: 2021-03-25 | Discharge: 2021-03-26 | Payer: PRIVATE HEALTH INSURANCE

## 2021-03-25 DIAGNOSIS — D899 Disorder involving the immune mechanism, unspecified: Secondary | ICD-10-CM | POA: Diagnosis not present

## 2021-03-25 DIAGNOSIS — T8610 Unspecified complication of kidney transplant: Secondary | ICD-10-CM | POA: Insufficient documentation

## 2021-03-25 DIAGNOSIS — Z9483 Pancreas transplant status: Secondary | ICD-10-CM | POA: Diagnosis not present

## 2021-03-25 DIAGNOSIS — Z789 Other specified health status: Secondary | ICD-10-CM | POA: Diagnosis not present

## 2021-03-25 DIAGNOSIS — Z79899 Other long term (current) drug therapy: Secondary | ICD-10-CM | POA: Insufficient documentation

## 2021-03-25 DIAGNOSIS — D631 Anemia in chronic kidney disease: Secondary | ICD-10-CM | POA: Diagnosis not present

## 2021-03-25 DIAGNOSIS — N390 Urinary tract infection, site not specified: Secondary | ICD-10-CM | POA: Insufficient documentation

## 2021-03-25 DIAGNOSIS — Z1159 Encounter for screening for other viral diseases: Secondary | ICD-10-CM | POA: Diagnosis not present

## 2021-03-25 DIAGNOSIS — E1129 Type 2 diabetes mellitus with other diabetic kidney complication: Secondary | ICD-10-CM | POA: Diagnosis not present

## 2021-03-25 DIAGNOSIS — B259 Cytomegaloviral disease, unspecified: Secondary | ICD-10-CM | POA: Insufficient documentation

## 2021-03-25 DIAGNOSIS — X58XXXA Exposure to other specified factors, initial encounter: Secondary | ICD-10-CM | POA: Insufficient documentation

## 2021-03-25 DIAGNOSIS — D849 Immunodeficiency, unspecified: Secondary | ICD-10-CM | POA: Diagnosis not present

## 2021-03-25 DIAGNOSIS — Z940 Kidney transplant status: Secondary | ICD-10-CM | POA: Diagnosis not present

## 2021-03-25 DIAGNOSIS — Z114 Encounter for screening for human immunodeficiency virus [HIV]: Secondary | ICD-10-CM | POA: Insufficient documentation

## 2021-03-25 LAB — CBC WITH DIFFERENTIAL/PLATELET
Abs Immature Granulocytes: 0.03 10*3/uL (ref 0.00–0.07)
Basophils Absolute: 0.1 10*3/uL (ref 0.0–0.1)
Basophils Relative: 1 %
Eosinophils Absolute: 0.6 10*3/uL — ABNORMAL HIGH (ref 0.0–0.5)
Eosinophils Relative: 9 %
HCT: 28 % — ABNORMAL LOW (ref 39.0–52.0)
Hemoglobin: 8.8 g/dL — ABNORMAL LOW (ref 13.0–17.0)
Immature Granulocytes: 1 %
Lymphocytes Relative: 12 %
Lymphs Abs: 0.8 10*3/uL (ref 0.7–4.0)
MCH: 30.6 pg (ref 26.0–34.0)
MCHC: 31.4 g/dL (ref 30.0–36.0)
MCV: 97.2 fL (ref 80.0–100.0)
Monocytes Absolute: 0.9 10*3/uL (ref 0.1–1.0)
Monocytes Relative: 14 %
Neutro Abs: 4.1 10*3/uL (ref 1.7–7.7)
Neutrophils Relative %: 63 %
Platelets: 158 10*3/uL (ref 150–400)
RBC: 2.88 MIL/uL — ABNORMAL LOW (ref 4.22–5.81)
RDW: 12.7 % (ref 11.5–15.5)
WBC: 6.4 10*3/uL (ref 4.0–10.5)
nRBC: 0 % (ref 0.0–0.2)

## 2021-03-25 LAB — BASIC METABOLIC PANEL
Anion gap: 5 (ref 5–15)
BUN: 33 mg/dL — ABNORMAL HIGH (ref 8–23)
CO2: 27 mmol/L (ref 22–32)
Calcium: 8.5 mg/dL — ABNORMAL LOW (ref 8.9–10.3)
Chloride: 108 mmol/L (ref 98–111)
Creatinine, Ser: 1.41 mg/dL — ABNORMAL HIGH (ref 0.61–1.24)
GFR, Estimated: 56 mL/min — ABNORMAL LOW (ref >60)
Glucose, Bld: 103 mg/dL — ABNORMAL HIGH (ref 70–99)
Potassium: 4.4 mmol/L (ref 3.5–5.1)
Sodium: 140 mmol/L (ref 135–145)

## 2021-03-25 LAB — MAGNESIUM: Magnesium: 1.7 mg/dL (ref 1.7–2.4)

## 2021-03-25 LAB — PHOSPHORUS: Phosphorus: 3.6 mg/dL (ref 2.5–4.6)

## 2021-03-26 DIAGNOSIS — D631 Anemia in chronic kidney disease: Principal | ICD-10-CM

## 2021-03-26 DIAGNOSIS — Z940 Kidney transplant status: Principal | ICD-10-CM

## 2021-03-26 DIAGNOSIS — N1831 Anemia in stage 3a chronic kidney disease (CMS-HCC): Principal | ICD-10-CM

## 2021-03-31 MED ORDER — AMLODIPINE 2.5 MG TABLET
ORAL_TABLET | 3 refills | 0 days | Status: CP
Start: 2021-03-31 — End: ?

## 2021-04-02 ENCOUNTER — Other Ambulatory Visit
Admission: RE | Admit: 2021-04-02 | Discharge: 2021-04-02 | Disposition: A | Discharge status: Home / Self Care | Payer: BC Managed Care – PPO | Attending: Nephrology | Admitting: Nephrology

## 2021-04-02 ENCOUNTER — Other Ambulatory Visit: Payer: Self-pay

## 2021-04-02 ENCOUNTER — Encounter: Admit: 2021-04-02 | Discharge: 2021-04-03 | Payer: PRIVATE HEALTH INSURANCE

## 2021-04-02 DIAGNOSIS — N390 Urinary tract infection, site not specified: Secondary | ICD-10-CM | POA: Insufficient documentation

## 2021-04-02 DIAGNOSIS — Z789 Other specified health status: Secondary | ICD-10-CM | POA: Diagnosis not present

## 2021-04-02 DIAGNOSIS — E1129 Type 2 diabetes mellitus with other diabetic kidney complication: Secondary | ICD-10-CM | POA: Insufficient documentation

## 2021-04-02 DIAGNOSIS — D631 Anemia in chronic kidney disease: Secondary | ICD-10-CM | POA: Insufficient documentation

## 2021-04-02 DIAGNOSIS — Z09 Encounter for follow-up examination after completed treatment for conditions other than malignant neoplasm: Secondary | ICD-10-CM | POA: Insufficient documentation

## 2021-04-02 DIAGNOSIS — Z9483 Pancreas transplant status: Secondary | ICD-10-CM | POA: Diagnosis not present

## 2021-04-02 DIAGNOSIS — D899 Disorder involving the immune mechanism, unspecified: Secondary | ICD-10-CM | POA: Diagnosis not present

## 2021-04-02 DIAGNOSIS — D849 Immunodeficiency, unspecified: Secondary | ICD-10-CM | POA: Diagnosis not present

## 2021-04-02 DIAGNOSIS — Z940 Kidney transplant status: Secondary | ICD-10-CM | POA: Diagnosis not present

## 2021-04-02 DIAGNOSIS — Z79899 Other long term (current) drug therapy: Secondary | ICD-10-CM | POA: Diagnosis not present

## 2021-04-02 DIAGNOSIS — Z1159 Encounter for screening for other viral diseases: Secondary | ICD-10-CM | POA: Diagnosis not present

## 2021-04-02 LAB — BASIC METABOLIC PANEL
Anion gap: 8 (ref 5–15)
BUN: 30 mg/dL — ABNORMAL HIGH (ref 8–23)
CO2: 28 mmol/L (ref 22–32)
Calcium: 8.6 mg/dL — ABNORMAL LOW (ref 8.9–10.3)
Chloride: 103 mmol/L (ref 98–111)
Creatinine, Ser: 1.5 mg/dL — ABNORMAL HIGH (ref 0.61–1.24)
GFR, Estimated: 52 mL/min — ABNORMAL LOW (ref >60)
Glucose, Bld: 199 mg/dL — ABNORMAL HIGH (ref 70–99)
Potassium: 3.9 mmol/L (ref 3.5–5.1)
Sodium: 139 mmol/L (ref 135–145)

## 2021-04-02 LAB — CBC WITH DIFFERENTIAL/PLATELET
Abs Immature Granulocytes: 0.02 10*3/uL (ref 0.00–0.07)
Basophils Absolute: 0.1 10*3/uL (ref 0.0–0.1)
Basophils Relative: 1 %
Eosinophils Absolute: 0.6 10*3/uL — ABNORMAL HIGH (ref 0.0–0.5)
Eosinophils Relative: 8 %
HCT: 29.2 % — ABNORMAL LOW (ref 39.0–52.0)
Hemoglobin: 9 g/dL — ABNORMAL LOW (ref 13.0–17.0)
Immature Granulocytes: 0 %
Lymphocytes Relative: 17 %
Lymphs Abs: 1.2 10*3/uL (ref 0.7–4.0)
MCH: 29.5 pg (ref 26.0–34.0)
MCHC: 30.8 g/dL (ref 30.0–36.0)
MCV: 95.7 fL (ref 80.0–100.0)
Monocytes Absolute: 1 10*3/uL (ref 0.1–1.0)
Monocytes Relative: 15 %
Neutro Abs: 4.1 10*3/uL (ref 1.7–7.7)
Neutrophils Relative %: 59 %
Platelets: 245 10*3/uL (ref 150–400)
RBC: 3.05 MIL/uL — ABNORMAL LOW (ref 4.22–5.81)
RDW: 12.2 % (ref 11.5–15.5)
WBC: 6.9 10*3/uL (ref 4.0–10.5)
nRBC: 0 % (ref 0.0–0.2)

## 2021-04-02 LAB — MAGNESIUM: Magnesium: 1.8 mg/dL (ref 1.7–2.4)

## 2021-04-02 LAB — PHOSPHORUS: Phosphorus: 3.3 mg/dL (ref 2.5–4.6)

## 2021-04-07 LAB — SIROLIMUS LEVEL: Sirolimus (Rapamycin): 7.8 ng/mL (ref 3.0–20.0)

## 2021-04-09 ENCOUNTER — Other Ambulatory Visit
Admission: RE | Admit: 2021-04-09 | Discharge: 2021-04-09 | Disposition: A | Discharge status: Home / Self Care | Payer: BC Managed Care – PPO | Attending: Nephrology | Admitting: Nephrology

## 2021-04-09 ENCOUNTER — Other Ambulatory Visit: Payer: Self-pay

## 2021-04-09 ENCOUNTER — Encounter
Admit: 2021-04-09 | Discharge: 2021-04-09 | Payer: PRIVATE HEALTH INSURANCE | Attending: Nephrology | Primary: Nephrology

## 2021-04-09 DIAGNOSIS — Z79899 Other long term (current) drug therapy: Secondary | ICD-10-CM | POA: Diagnosis not present

## 2021-04-09 DIAGNOSIS — Z114 Encounter for screening for human immunodeficiency virus [HIV]: Secondary | ICD-10-CM | POA: Insufficient documentation

## 2021-04-09 DIAGNOSIS — Z09 Encounter for follow-up examination after completed treatment for conditions other than malignant neoplasm: Secondary | ICD-10-CM | POA: Diagnosis not present

## 2021-04-09 DIAGNOSIS — Z789 Other specified health status: Secondary | ICD-10-CM | POA: Diagnosis not present

## 2021-04-09 DIAGNOSIS — N390 Urinary tract infection, site not specified: Secondary | ICD-10-CM | POA: Insufficient documentation

## 2021-04-09 DIAGNOSIS — E1129 Type 2 diabetes mellitus with other diabetic kidney complication: Secondary | ICD-10-CM | POA: Diagnosis not present

## 2021-04-09 DIAGNOSIS — Z940 Kidney transplant status: Secondary | ICD-10-CM | POA: Diagnosis not present

## 2021-04-09 DIAGNOSIS — D899 Disorder involving the immune mechanism, unspecified: Secondary | ICD-10-CM | POA: Diagnosis not present

## 2021-04-09 DIAGNOSIS — B259 Cytomegaloviral disease, unspecified: Secondary | ICD-10-CM | POA: Insufficient documentation

## 2021-04-09 DIAGNOSIS — Z9483 Pancreas transplant status: Secondary | ICD-10-CM | POA: Diagnosis not present

## 2021-04-09 DIAGNOSIS — E559 Vitamin D deficiency, unspecified: Secondary | ICD-10-CM | POA: Diagnosis not present

## 2021-04-09 DIAGNOSIS — D631 Anemia in chronic kidney disease: Secondary | ICD-10-CM | POA: Diagnosis not present

## 2021-04-09 LAB — CBC WITH DIFFERENTIAL/PLATELET
Abs Immature Granulocytes: 0.02 10*3/uL (ref 0.00–0.07)
Basophils Absolute: 0.1 10*3/uL (ref 0.0–0.1)
Basophils Relative: 1 %
Eosinophils Absolute: 0.6 10*3/uL — ABNORMAL HIGH (ref 0.0–0.5)
Eosinophils Relative: 10 %
HCT: 27.7 % — ABNORMAL LOW (ref 39.0–52.0)
Hemoglobin: 8.6 g/dL — ABNORMAL LOW (ref 13.0–17.0)
Immature Granulocytes: 0 %
Lymphocytes Relative: 20 %
Lymphs Abs: 1.2 10*3/uL (ref 0.7–4.0)
MCH: 30.1 pg (ref 26.0–34.0)
MCHC: 31 g/dL (ref 30.0–36.0)
MCV: 96.9 fL (ref 80.0–100.0)
Monocytes Absolute: 0.8 10*3/uL (ref 0.1–1.0)
Monocytes Relative: 12 %
Neutro Abs: 3.4 10*3/uL (ref 1.7–7.7)
Neutrophils Relative %: 57 %
Platelets: 216 10*3/uL (ref 150–400)
RBC: 2.86 MIL/uL — ABNORMAL LOW (ref 4.22–5.81)
RDW: 12.6 % (ref 11.5–15.5)
WBC: 6.1 10*3/uL (ref 4.0–10.5)
nRBC: 0 % (ref 0.0–0.2)

## 2021-04-09 LAB — BASIC METABOLIC PANEL
Anion gap: 8 (ref 5–15)
BUN: 40 mg/dL — ABNORMAL HIGH (ref 8–23)
CO2: 26 mmol/L (ref 22–32)
Calcium: 8.3 mg/dL — ABNORMAL LOW (ref 8.9–10.3)
Chloride: 105 mmol/L (ref 98–111)
Creatinine, Ser: 1.7 mg/dL — ABNORMAL HIGH (ref 0.61–1.24)
GFR, Estimated: 45 mL/min — ABNORMAL LOW (ref >60)
Glucose, Bld: 99 mg/dL (ref 70–99)
Potassium: 4.1 mmol/L (ref 3.5–5.1)
Sodium: 139 mmol/L (ref 135–145)

## 2021-04-09 LAB — PHOSPHORUS: Phosphorus: 3.8 mg/dL (ref 2.5–4.6)

## 2021-04-09 LAB — MAGNESIUM: Magnesium: 1.9 mg/dL (ref 1.7–2.4)

## 2021-04-11 DIAGNOSIS — Z940 Kidney transplant status: Principal | ICD-10-CM

## 2021-04-14 LAB — SIROLIMUS LEVEL: Sirolimus (Rapamycin): 7.2 ng/mL (ref 3.0–20.0)

## 2021-04-17 MED FILL — CINACALCET 60 MG TABLET: ORAL | 30 days supply | Qty: 30 | Fill #3

## 2021-04-21 DIAGNOSIS — E1069 Type 1 diabetes mellitus with other specified complication: Secondary | ICD-10-CM | POA: Diagnosis not present

## 2021-04-21 DIAGNOSIS — E1021 Type 1 diabetes mellitus with diabetic nephropathy: Secondary | ICD-10-CM | POA: Diagnosis not present

## 2021-04-21 DIAGNOSIS — E1159 Type 2 diabetes mellitus with other circulatory complications: Secondary | ICD-10-CM | POA: Diagnosis not present

## 2021-04-21 DIAGNOSIS — E785 Hyperlipidemia, unspecified: Secondary | ICD-10-CM | POA: Diagnosis not present

## 2021-04-24 DIAGNOSIS — Z940 Kidney transplant status: Principal | ICD-10-CM

## 2021-04-24 MED ORDER — ATORVASTATIN 20 MG TABLET
ORAL_TABLET | 3 refills | 0 days | Status: CP
Start: 2021-04-24 — End: ?

## 2021-04-30 ENCOUNTER — Other Ambulatory Visit: Payer: Self-pay

## 2021-04-30 ENCOUNTER — Other Ambulatory Visit
Admission: RE | Admit: 2021-04-30 | Discharge: 2021-04-30 | Disposition: A | Discharge status: Home / Self Care | Payer: BC Managed Care – PPO | Attending: Nephrology | Admitting: Nephrology

## 2021-04-30 ENCOUNTER — Encounter
Admit: 2021-04-30 | Discharge: 2021-04-30 | Payer: PRIVATE HEALTH INSURANCE | Attending: Nephrology | Primary: Nephrology

## 2021-04-30 DIAGNOSIS — Z114 Encounter for screening for human immunodeficiency virus [HIV]: Secondary | ICD-10-CM | POA: Diagnosis not present

## 2021-04-30 DIAGNOSIS — E1129 Type 2 diabetes mellitus with other diabetic kidney complication: Secondary | ICD-10-CM | POA: Insufficient documentation

## 2021-04-30 DIAGNOSIS — E559 Vitamin D deficiency, unspecified: Secondary | ICD-10-CM | POA: Insufficient documentation

## 2021-04-30 DIAGNOSIS — Z79899 Other long term (current) drug therapy: Secondary | ICD-10-CM | POA: Insufficient documentation

## 2021-04-30 DIAGNOSIS — Z9483 Pancreas transplant status: Secondary | ICD-10-CM | POA: Insufficient documentation

## 2021-04-30 DIAGNOSIS — D899 Disorder involving the immune mechanism, unspecified: Secondary | ICD-10-CM | POA: Diagnosis not present

## 2021-04-30 DIAGNOSIS — B259 Cytomegaloviral disease, unspecified: Secondary | ICD-10-CM | POA: Insufficient documentation

## 2021-04-30 DIAGNOSIS — Z940 Kidney transplant status: Secondary | ICD-10-CM | POA: Diagnosis not present

## 2021-04-30 DIAGNOSIS — N390 Urinary tract infection, site not specified: Secondary | ICD-10-CM | POA: Diagnosis not present

## 2021-04-30 DIAGNOSIS — D631 Anemia in chronic kidney disease: Secondary | ICD-10-CM | POA: Insufficient documentation

## 2021-04-30 DIAGNOSIS — Z789 Other specified health status: Secondary | ICD-10-CM | POA: Insufficient documentation

## 2021-04-30 DIAGNOSIS — Z09 Encounter for follow-up examination after completed treatment for conditions other than malignant neoplasm: Secondary | ICD-10-CM | POA: Insufficient documentation

## 2021-04-30 LAB — BASIC METABOLIC PANEL
Anion gap: 8 (ref 5–15)
BUN: 35 mg/dL — ABNORMAL HIGH (ref 8–23)
CO2: 24 mmol/L (ref 22–32)
Calcium: 9.1 mg/dL (ref 8.9–10.3)
Chloride: 109 mmol/L (ref 98–111)
Creatinine, Ser: 1.5 mg/dL — ABNORMAL HIGH (ref 0.61–1.24)
GFR, Estimated: 52 mL/min — ABNORMAL LOW (ref >60)
Glucose, Bld: 63 mg/dL — ABNORMAL LOW (ref 70–99)
Potassium: 4.4 mmol/L (ref 3.5–5.1)
Sodium: 141 mmol/L (ref 135–145)

## 2021-04-30 LAB — PHOSPHORUS: Phosphorus: 3.5 mg/dL (ref 2.5–4.6)

## 2021-04-30 LAB — CBC WITH DIFFERENTIAL/PLATELET
Abs Immature Granulocytes: 0.03 10*3/uL (ref 0.00–0.07)
Basophils Absolute: 0.1 10*3/uL (ref 0.0–0.1)
Basophils Relative: 1 %
Eosinophils Absolute: 1.4 10*3/uL — ABNORMAL HIGH (ref 0.0–0.5)
Eosinophils Relative: 15 %
HCT: 29.3 % — ABNORMAL LOW (ref 39.0–52.0)
Hemoglobin: 9.1 g/dL — ABNORMAL LOW (ref 13.0–17.0)
Immature Granulocytes: 0 %
Lymphocytes Relative: 15 %
Lymphs Abs: 1.3 10*3/uL (ref 0.7–4.0)
MCH: 29.5 pg (ref 26.0–34.0)
MCHC: 31.1 g/dL (ref 30.0–36.0)
MCV: 95.1 fL (ref 80.0–100.0)
Monocytes Absolute: 0.7 10*3/uL (ref 0.1–1.0)
Monocytes Relative: 8 %
Neutro Abs: 5.5 10*3/uL (ref 1.7–7.7)
Neutrophils Relative %: 61 %
Platelets: 229 10*3/uL (ref 150–400)
RBC: 3.08 MIL/uL — ABNORMAL LOW (ref 4.22–5.81)
RDW: 13.8 % (ref 11.5–15.5)
WBC: 9.1 10*3/uL (ref 4.0–10.5)
nRBC: 0 % (ref 0.0–0.2)

## 2021-04-30 LAB — MAGNESIUM: Magnesium: 1.6 mg/dL — ABNORMAL LOW (ref 1.7–2.4)

## 2021-05-02 DIAGNOSIS — Z940 Kidney transplant status: Principal | ICD-10-CM

## 2021-05-19 MED FILL — CINACALCET 60 MG TABLET: ORAL | 30 days supply | Qty: 30 | Fill #4

## 2021-05-22 DIAGNOSIS — E1021 Type 1 diabetes mellitus with diabetic nephropathy: Secondary | ICD-10-CM | POA: Diagnosis not present

## 2021-05-22 DIAGNOSIS — E10649 Type 1 diabetes mellitus with hypoglycemia without coma: Secondary | ICD-10-CM | POA: Diagnosis not present

## 2021-05-22 DIAGNOSIS — E10641 Type 1 diabetes mellitus with hypoglycemia with coma: Secondary | ICD-10-CM | POA: Diagnosis not present

## 2021-06-18 DIAGNOSIS — Z940 Kidney transplant status: Principal | ICD-10-CM

## 2021-06-19 MED FILL — CINACALCET 60 MG TABLET: ORAL | 30 days supply | Qty: 30 | Fill #5

## 2021-06-25 DIAGNOSIS — E10649 Type 1 diabetes mellitus with hypoglycemia without coma: Secondary | ICD-10-CM | POA: Diagnosis not present

## 2021-06-25 DIAGNOSIS — E10641 Type 1 diabetes mellitus with hypoglycemia with coma: Secondary | ICD-10-CM | POA: Diagnosis not present

## 2021-06-25 DIAGNOSIS — E1021 Type 1 diabetes mellitus with diabetic nephropathy: Secondary | ICD-10-CM | POA: Diagnosis not present

## 2021-06-25 DIAGNOSIS — E109 Type 1 diabetes mellitus without complications: Secondary | ICD-10-CM | POA: Diagnosis not present

## 2021-06-26 DIAGNOSIS — Z940 Kidney transplant status: Principal | ICD-10-CM

## 2021-06-26 MED ORDER — PROGRAF 1 MG CAPSULE
ORAL_CAPSULE | 3 refills | 0 days | Status: CP
Start: 2021-06-26 — End: ?

## 2021-07-22 MED FILL — CINACALCET 60 MG TABLET: ORAL | 30 days supply | Qty: 30 | Fill #6

## 2021-07-24 DIAGNOSIS — D2372 Other benign neoplasm of skin of left lower limb, including hip: Secondary | ICD-10-CM | POA: Diagnosis not present

## 2021-07-24 DIAGNOSIS — E109 Type 1 diabetes mellitus without complications: Secondary | ICD-10-CM | POA: Diagnosis not present

## 2021-07-24 DIAGNOSIS — M79672 Pain in left foot: Secondary | ICD-10-CM | POA: Diagnosis not present

## 2021-07-30 ENCOUNTER — Other Ambulatory Visit: Payer: Self-pay

## 2021-07-30 ENCOUNTER — Other Ambulatory Visit
Admission: RE | Admit: 2021-07-30 | Discharge: 2021-07-30 | Disposition: A | Discharge status: Home / Self Care | Payer: BC Managed Care – PPO | Attending: Nephrology | Admitting: Nephrology

## 2021-07-30 ENCOUNTER — Encounter
Admit: 2021-07-30 | Discharge: 2021-07-30 | Payer: PRIVATE HEALTH INSURANCE | Attending: Nephrology | Primary: Nephrology

## 2021-07-30 DIAGNOSIS — B259 Cytomegaloviral disease, unspecified: Secondary | ICD-10-CM | POA: Diagnosis not present

## 2021-07-30 DIAGNOSIS — Z79899 Other long term (current) drug therapy: Secondary | ICD-10-CM | POA: Diagnosis not present

## 2021-07-30 DIAGNOSIS — Z09 Encounter for follow-up examination after completed treatment for conditions other than malignant neoplasm: Secondary | ICD-10-CM | POA: Diagnosis not present

## 2021-07-30 DIAGNOSIS — Z789 Other specified health status: Secondary | ICD-10-CM | POA: Insufficient documentation

## 2021-07-30 DIAGNOSIS — D631 Anemia in chronic kidney disease: Secondary | ICD-10-CM | POA: Insufficient documentation

## 2021-07-30 DIAGNOSIS — E1129 Type 2 diabetes mellitus with other diabetic kidney complication: Secondary | ICD-10-CM | POA: Diagnosis not present

## 2021-07-30 DIAGNOSIS — N390 Urinary tract infection, site not specified: Secondary | ICD-10-CM | POA: Diagnosis not present

## 2021-07-30 DIAGNOSIS — D899 Disorder involving the immune mechanism, unspecified: Secondary | ICD-10-CM | POA: Diagnosis not present

## 2021-07-30 DIAGNOSIS — Z9483 Pancreas transplant status: Secondary | ICD-10-CM | POA: Insufficient documentation

## 2021-07-30 DIAGNOSIS — Z114 Encounter for screening for human immunodeficiency virus [HIV]: Secondary | ICD-10-CM | POA: Diagnosis not present

## 2021-07-30 DIAGNOSIS — Z940 Kidney transplant status: Secondary | ICD-10-CM | POA: Insufficient documentation

## 2021-07-30 DIAGNOSIS — E559 Vitamin D deficiency, unspecified: Secondary | ICD-10-CM | POA: Insufficient documentation

## 2021-07-30 LAB — CBC WITH DIFFERENTIAL/PLATELET
Abs Immature Granulocytes: 0.02 10*3/uL (ref 0.00–0.07)
Basophils Absolute: 0.1 10*3/uL (ref 0.0–0.1)
Basophils Relative: 1 %
Eosinophils Absolute: 0.6 10*3/uL — ABNORMAL HIGH (ref 0.0–0.5)
Eosinophils Relative: 11 %
HCT: 28.9 % — ABNORMAL LOW (ref 39.0–52.0)
Hemoglobin: 9.2 g/dL — ABNORMAL LOW (ref 13.0–17.0)
Immature Granulocytes: 0 %
Lymphocytes Relative: 19 %
Lymphs Abs: 1 10*3/uL (ref 0.7–4.0)
MCH: 31.3 pg (ref 26.0–34.0)
MCHC: 31.8 g/dL (ref 30.0–36.0)
MCV: 98.3 fL (ref 80.0–100.0)
Monocytes Absolute: 0.5 10*3/uL (ref 0.1–1.0)
Monocytes Relative: 8 %
Neutro Abs: 3.4 10*3/uL (ref 1.7–7.7)
Neutrophils Relative %: 61 %
Platelets: 179 10*3/uL (ref 150–400)
RBC: 2.94 MIL/uL — ABNORMAL LOW (ref 4.22–5.81)
RDW: 13.8 % (ref 11.5–15.5)
WBC: 5.6 10*3/uL (ref 4.0–10.5)
nRBC: 0 % (ref 0.0–0.2)

## 2021-07-30 LAB — BASIC METABOLIC PANEL
Anion gap: 5 (ref 5–15)
BUN: 54 mg/dL — ABNORMAL HIGH (ref 8–23)
CO2: 25 mmol/L (ref 22–32)
Calcium: 9.3 mg/dL (ref 8.9–10.3)
Chloride: 108 mmol/L (ref 98–111)
Creatinine, Ser: 1.52 mg/dL — ABNORMAL HIGH (ref 0.61–1.24)
GFR, Estimated: 51 mL/min — ABNORMAL LOW (ref >60)
Glucose, Bld: 101 mg/dL — ABNORMAL HIGH (ref 70–99)
Potassium: 4.8 mmol/L (ref 3.5–5.1)
Sodium: 138 mmol/L (ref 135–145)

## 2021-07-30 LAB — MAGNESIUM: Magnesium: 1.8 mg/dL (ref 1.7–2.4)

## 2021-07-30 LAB — PHOSPHORUS: Phosphorus: 3.6 mg/dL (ref 2.5–4.6)

## 2021-08-01 DIAGNOSIS — Z940 Kidney transplant status: Principal | ICD-10-CM

## 2021-08-04 DIAGNOSIS — Z872 Personal history of diseases of the skin and subcutaneous tissue: Secondary | ICD-10-CM | POA: Diagnosis not present

## 2021-08-04 DIAGNOSIS — L578 Other skin changes due to chronic exposure to nonionizing radiation: Secondary | ICD-10-CM | POA: Diagnosis not present

## 2021-08-04 DIAGNOSIS — Z85828 Personal history of other malignant neoplasm of skin: Secondary | ICD-10-CM | POA: Diagnosis not present

## 2021-08-04 DIAGNOSIS — Z859 Personal history of malignant neoplasm, unspecified: Secondary | ICD-10-CM | POA: Diagnosis not present

## 2021-08-07 DIAGNOSIS — E109 Type 1 diabetes mellitus without complications: Secondary | ICD-10-CM | POA: Diagnosis not present

## 2021-08-07 DIAGNOSIS — D2372 Other benign neoplasm of skin of left lower limb, including hip: Secondary | ICD-10-CM | POA: Diagnosis not present

## 2021-08-14 DIAGNOSIS — H401113 Primary open-angle glaucoma, right eye, severe stage: Secondary | ICD-10-CM | POA: Diagnosis not present

## 2021-08-14 LAB — HM DIABETES EYE EXAM

## 2021-08-20 MED FILL — CINACALCET 60 MG TABLET: ORAL | 30 days supply | Qty: 30 | Fill #7

## 2021-09-02 DIAGNOSIS — D84821 Immunocompromised state due to drug therapy (CMS-HCC): Principal | ICD-10-CM

## 2021-09-02 DIAGNOSIS — N189 Chronic kidney disease, unspecified: Principal | ICD-10-CM

## 2021-09-02 DIAGNOSIS — Z79899 Other long term (current) drug therapy: Principal | ICD-10-CM

## 2021-09-02 DIAGNOSIS — Z940 Kidney transplant status: Principal | ICD-10-CM

## 2021-09-02 DIAGNOSIS — Z1159 Encounter for screening for other viral diseases: Principal | ICD-10-CM

## 2021-09-02 DIAGNOSIS — D631 Anemia in chronic kidney disease: Principal | ICD-10-CM

## 2021-09-04 ENCOUNTER — Ambulatory Visit: Admit: 2021-09-04 | Discharge: 2021-09-04 | Payer: PRIVATE HEALTH INSURANCE

## 2021-09-04 ENCOUNTER — Ambulatory Visit
Admit: 2021-09-04 | Discharge: 2021-09-04 | Payer: PRIVATE HEALTH INSURANCE | Attending: Nephrology | Primary: Nephrology

## 2021-09-04 DIAGNOSIS — Z79899 Other long term (current) drug therapy: Principal | ICD-10-CM

## 2021-09-04 DIAGNOSIS — Z1159 Encounter for screening for other viral diseases: Principal | ICD-10-CM

## 2021-09-04 DIAGNOSIS — N1831 Anemia in stage 3a chronic kidney disease (CMS-HCC): Principal | ICD-10-CM

## 2021-09-04 DIAGNOSIS — Z940 Kidney transplant status: Principal | ICD-10-CM

## 2021-09-04 DIAGNOSIS — N189 Chronic kidney disease, unspecified: Principal | ICD-10-CM

## 2021-09-04 DIAGNOSIS — D84821 Immunocompromised state due to drug therapy (CMS-HCC): Principal | ICD-10-CM

## 2021-09-04 DIAGNOSIS — D849 Immunodeficiency, unspecified: Principal | ICD-10-CM

## 2021-09-04 DIAGNOSIS — D631 Anemia in chronic kidney disease: Principal | ICD-10-CM

## 2021-09-04 DIAGNOSIS — N2581 Secondary hyperparathyroidism of renal origin: Secondary | ICD-10-CM | POA: Diagnosis not present

## 2021-09-04 DIAGNOSIS — Z20822 Contact with and (suspected) exposure to covid-19: Secondary | ICD-10-CM | POA: Diagnosis not present

## 2021-09-04 DIAGNOSIS — Z23 Encounter for immunization: Secondary | ICD-10-CM | POA: Diagnosis not present

## 2021-09-05 DIAGNOSIS — Z940 Kidney transplant status: Principal | ICD-10-CM

## 2021-09-05 DIAGNOSIS — D369 Benign neoplasm, unspecified site: Principal | ICD-10-CM

## 2021-09-05 DIAGNOSIS — E10641 Type 1 diabetes mellitus with hypoglycemia with coma: Secondary | ICD-10-CM | POA: Diagnosis not present

## 2021-09-05 DIAGNOSIS — E10649 Type 1 diabetes mellitus with hypoglycemia without coma: Secondary | ICD-10-CM | POA: Diagnosis not present

## 2021-09-05 DIAGNOSIS — E1021 Type 1 diabetes mellitus with diabetic nephropathy: Secondary | ICD-10-CM | POA: Diagnosis not present

## 2021-09-05 MED ORDER — MYCOPHENOLATE SODIUM 360 MG TABLET,DELAYED RELEASE
ORAL_TABLET | 3 refills | 0 days
Start: 2021-09-05 — End: ?

## 2021-09-11 DIAGNOSIS — Z940 Kidney transplant status: Principal | ICD-10-CM

## 2021-09-11 DIAGNOSIS — D369 Benign neoplasm, unspecified site: Principal | ICD-10-CM

## 2021-09-11 MED ORDER — MYCOPHENOLATE SODIUM 360 MG TABLET,DELAYED RELEASE
ORAL_TABLET | Freq: Two times a day (BID) | ORAL | 3 refills | 90.00000 days | Status: CP
Start: 2021-09-11 — End: 2022-09-11

## 2021-09-23 MED FILL — CINACALCET 60 MG TABLET: ORAL | 30 days supply | Qty: 30 | Fill #8

## 2021-09-26 DIAGNOSIS — E1159 Type 2 diabetes mellitus with other circulatory complications: Secondary | ICD-10-CM | POA: Diagnosis not present

## 2021-09-26 DIAGNOSIS — E785 Hyperlipidemia, unspecified: Secondary | ICD-10-CM | POA: Diagnosis not present

## 2021-09-26 DIAGNOSIS — E1021 Type 1 diabetes mellitus with diabetic nephropathy: Secondary | ICD-10-CM | POA: Diagnosis not present

## 2021-09-26 DIAGNOSIS — E1069 Type 1 diabetes mellitus with other specified complication: Secondary | ICD-10-CM | POA: Diagnosis not present

## 2021-10-03 DIAGNOSIS — E109 Type 1 diabetes mellitus without complications: Secondary | ICD-10-CM | POA: Diagnosis not present

## 2021-10-03 DIAGNOSIS — E10641 Type 1 diabetes mellitus with hypoglycemia with coma: Secondary | ICD-10-CM | POA: Diagnosis not present

## 2021-10-03 DIAGNOSIS — E10649 Type 1 diabetes mellitus with hypoglycemia without coma: Secondary | ICD-10-CM | POA: Diagnosis not present

## 2021-10-03 DIAGNOSIS — E1021 Type 1 diabetes mellitus with diabetic nephropathy: Secondary | ICD-10-CM | POA: Diagnosis not present

## 2021-10-17 DIAGNOSIS — H401133 Primary open-angle glaucoma, bilateral, severe stage: Secondary | ICD-10-CM | POA: Diagnosis not present

## 2021-10-21 MED FILL — CINACALCET 60 MG TABLET: ORAL | 30 days supply | Qty: 30 | Fill #9

## 2021-11-05 ENCOUNTER — Other Ambulatory Visit
Admission: RE | Admit: 2021-11-05 | Discharge: 2021-11-05 | Disposition: A | Discharge status: Home / Self Care | Payer: BC Managed Care – PPO | Attending: Nephrology | Admitting: Nephrology

## 2021-11-05 ENCOUNTER — Encounter
Admit: 2021-11-05 | Discharge: 2021-11-05 | Payer: PRIVATE HEALTH INSURANCE | Attending: Nephrology | Primary: Nephrology

## 2021-11-05 DIAGNOSIS — Z940 Kidney transplant status: Secondary | ICD-10-CM | POA: Insufficient documentation

## 2021-11-05 DIAGNOSIS — Z114 Encounter for screening for human immunodeficiency virus [HIV]: Secondary | ICD-10-CM | POA: Diagnosis not present

## 2021-11-05 DIAGNOSIS — N390 Urinary tract infection, site not specified: Secondary | ICD-10-CM | POA: Diagnosis not present

## 2021-11-05 DIAGNOSIS — E1129 Type 2 diabetes mellitus with other diabetic kidney complication: Secondary | ICD-10-CM | POA: Diagnosis not present

## 2021-11-05 DIAGNOSIS — Z9483 Pancreas transplant status: Secondary | ICD-10-CM | POA: Diagnosis not present

## 2021-11-05 DIAGNOSIS — D6489 Other specified anemias: Secondary | ICD-10-CM | POA: Insufficient documentation

## 2021-11-05 DIAGNOSIS — Z09 Encounter for follow-up examination after completed treatment for conditions other than malignant neoplasm: Secondary | ICD-10-CM | POA: Diagnosis not present

## 2021-11-05 DIAGNOSIS — Z79899 Other long term (current) drug therapy: Secondary | ICD-10-CM | POA: Diagnosis not present

## 2021-11-05 DIAGNOSIS — D899 Disorder involving the immune mechanism, unspecified: Secondary | ICD-10-CM | POA: Diagnosis not present

## 2021-11-05 DIAGNOSIS — Z789 Other specified health status: Secondary | ICD-10-CM | POA: Insufficient documentation

## 2021-11-05 DIAGNOSIS — B259 Cytomegaloviral disease, unspecified: Secondary | ICD-10-CM | POA: Insufficient documentation

## 2021-11-05 DIAGNOSIS — E559 Vitamin D deficiency, unspecified: Secondary | ICD-10-CM | POA: Diagnosis not present

## 2021-11-05 LAB — CBC WITH DIFFERENTIAL/PLATELET
Abs Immature Granulocytes: 0.09 10*3/uL — ABNORMAL HIGH (ref 0.00–0.07)
Basophils Absolute: 0.1 10*3/uL (ref 0.0–0.1)
Basophils Relative: 2 %
Eosinophils Absolute: 0.7 10*3/uL — ABNORMAL HIGH (ref 0.0–0.5)
Eosinophils Relative: 12 %
HCT: 31.5 % — ABNORMAL LOW (ref 39.0–52.0)
Hemoglobin: 9.7 g/dL — ABNORMAL LOW (ref 13.0–17.0)
Immature Granulocytes: 2 %
Lymphocytes Relative: 20 %
Lymphs Abs: 1.2 10*3/uL (ref 0.7–4.0)
MCH: 31.4 pg (ref 26.0–34.0)
MCHC: 30.8 g/dL (ref 30.0–36.0)
MCV: 101.9 fL — ABNORMAL HIGH (ref 80.0–100.0)
Monocytes Absolute: 0.5 10*3/uL (ref 0.1–1.0)
Monocytes Relative: 8 %
Neutro Abs: 3.3 10*3/uL (ref 1.7–7.7)
Neutrophils Relative %: 56 %
Platelets: 187 10*3/uL (ref 150–400)
RBC: 3.09 MIL/uL — ABNORMAL LOW (ref 4.22–5.81)
RDW: 13.1 % (ref 11.5–15.5)
WBC: 5.9 10*3/uL (ref 4.0–10.5)
nRBC: 0 % (ref 0.0–0.2)

## 2021-11-05 LAB — BASIC METABOLIC PANEL
Anion gap: 6 (ref 5–15)
BUN: 41 mg/dL — ABNORMAL HIGH (ref 8–23)
CO2: 25 mmol/L (ref 22–32)
Calcium: 9.2 mg/dL (ref 8.9–10.3)
Chloride: 108 mmol/L (ref 98–111)
Creatinine, Ser: 1.24 mg/dL (ref 0.61–1.24)
GFR, Estimated: >60 mL/min (ref >60)
Glucose, Bld: 138 mg/dL — ABNORMAL HIGH (ref 70–99)
Potassium: 4.4 mmol/L (ref 3.5–5.1)
Sodium: 139 mmol/L (ref 135–145)

## 2021-11-05 LAB — MAGNESIUM: Magnesium: 1.6 mg/dL — ABNORMAL LOW (ref 1.7–2.4)

## 2021-11-05 LAB — PHOSPHORUS: Phosphorus: 3.7 mg/dL (ref 2.5–4.6)

## 2021-11-06 MED ORDER — LISINOPRIL 40 MG TABLET
ORAL_TABLET | 3 refills | 0 days
Start: 2021-11-06 — End: ?

## 2021-11-10 DIAGNOSIS — Z940 Kidney transplant status: Principal | ICD-10-CM

## 2021-11-20 MED FILL — CINACALCET 60 MG TABLET: ORAL | 30 days supply | Qty: 30 | Fill #10

## 2021-12-19 DIAGNOSIS — H401133 Primary open-angle glaucoma, bilateral, severe stage: Secondary | ICD-10-CM | POA: Diagnosis not present

## 2021-12-25 DIAGNOSIS — Z940 Kidney transplant status: Principal | ICD-10-CM

## 2021-12-26 MED FILL — CINACALCET 60 MG TABLET: ORAL | 30 days supply | Qty: 30 | Fill #11

## 2022-01-23 MED ORDER — LISINOPRIL 40 MG TABLET
ORAL_TABLET | Freq: Every day | ORAL | 3 refills | 90 days | Status: CP
Start: 2022-01-23 — End: 2023-01-23

## 2022-01-30 DIAGNOSIS — Z940 Kidney transplant status: Principal | ICD-10-CM

## 2022-01-30 MED ORDER — CINACALCET 60 MG TABLET
ORAL_TABLET | Freq: Every day | ORAL | 3 refills | 90.00000 days | Status: CN
Start: 2022-01-30 — End: 2023-01-30

## 2022-02-03 ENCOUNTER — Other Ambulatory Visit
Admission: RE | Admit: 2022-02-03 | Discharge: 2022-02-03 | Disposition: A | Discharge status: Home / Self Care | Payer: BC Managed Care – PPO | Attending: Nephrology | Admitting: Nephrology

## 2022-02-03 ENCOUNTER — Other Ambulatory Visit: Payer: Self-pay

## 2022-02-03 DIAGNOSIS — Z940 Kidney transplant status: Principal | ICD-10-CM

## 2022-02-03 DIAGNOSIS — B259 Cytomegaloviral disease, unspecified: Secondary | ICD-10-CM | POA: Diagnosis not present

## 2022-02-03 DIAGNOSIS — Z789 Other specified health status: Secondary | ICD-10-CM | POA: Diagnosis not present

## 2022-02-03 DIAGNOSIS — Z79899 Other long term (current) drug therapy: Secondary | ICD-10-CM | POA: Diagnosis not present

## 2022-02-03 DIAGNOSIS — E1129 Type 2 diabetes mellitus with other diabetic kidney complication: Secondary | ICD-10-CM | POA: Insufficient documentation

## 2022-02-03 DIAGNOSIS — D6489 Other specified anemias: Secondary | ICD-10-CM | POA: Insufficient documentation

## 2022-02-03 DIAGNOSIS — D899 Disorder involving the immune mechanism, unspecified: Secondary | ICD-10-CM | POA: Insufficient documentation

## 2022-02-03 DIAGNOSIS — E559 Vitamin D deficiency, unspecified: Secondary | ICD-10-CM | POA: Diagnosis not present

## 2022-02-03 DIAGNOSIS — Z114 Encounter for screening for human immunodeficiency virus [HIV]: Secondary | ICD-10-CM | POA: Diagnosis not present

## 2022-02-03 DIAGNOSIS — Z9483 Pancreas transplant status: Secondary | ICD-10-CM | POA: Insufficient documentation

## 2022-02-03 DIAGNOSIS — Z09 Encounter for follow-up examination after completed treatment for conditions other than malignant neoplasm: Secondary | ICD-10-CM | POA: Diagnosis not present

## 2022-02-03 LAB — CBC WITH DIFFERENTIAL/PLATELET
Abs Immature Granulocytes: 0.1 10*3/uL — ABNORMAL HIGH (ref 0.00–0.07)
Basophils Absolute: 0.1 10*3/uL (ref 0.0–0.1)
Basophils Relative: 1 %
Eosinophils Absolute: 0.8 10*3/uL — ABNORMAL HIGH (ref 0.0–0.5)
Eosinophils Relative: 12 %
HCT: 29.6 % — ABNORMAL LOW (ref 39.0–52.0)
Hemoglobin: 9.5 g/dL — ABNORMAL LOW (ref 13.0–17.0)
Immature Granulocytes: 2 %
Lymphocytes Relative: 12 %
Lymphs Abs: 0.8 10*3/uL (ref 0.7–4.0)
MCH: 31.8 pg (ref 26.0–34.0)
MCHC: 32.1 g/dL (ref 30.0–36.0)
MCV: 99 fL (ref 80.0–100.0)
Monocytes Absolute: 0.5 10*3/uL (ref 0.1–1.0)
Monocytes Relative: 7 %
Neutro Abs: 4.6 10*3/uL (ref 1.7–7.7)
Neutrophils Relative %: 66 %
Platelets: 110 10*3/uL — ABNORMAL LOW (ref 150–400)
RBC: 2.99 MIL/uL — ABNORMAL LOW (ref 4.22–5.81)
RDW: 13.5 % (ref 11.5–15.5)
WBC: 6.9 10*3/uL (ref 4.0–10.5)
nRBC: 0 % (ref 0.0–0.2)

## 2022-02-03 LAB — BASIC METABOLIC PANEL
Anion gap: 5 (ref 5–15)
BUN: 50 mg/dL — ABNORMAL HIGH (ref 8–23)
CO2: 22 mmol/L (ref 22–32)
Calcium: 10.3 mg/dL (ref 8.9–10.3)
Chloride: 106 mmol/L (ref 98–111)
Creatinine, Ser: 1.45 mg/dL — ABNORMAL HIGH (ref 0.61–1.24)
GFR, Estimated: 54 mL/min — ABNORMAL LOW (ref >60)
Glucose, Bld: 305 mg/dL — ABNORMAL HIGH (ref 70–99)
Potassium: 4.7 mmol/L (ref 3.5–5.1)
Sodium: 133 mmol/L — ABNORMAL LOW (ref 135–145)

## 2022-02-03 LAB — PHOSPHORUS: Phosphorus: 2.7 mg/dL (ref 2.5–4.6)

## 2022-02-03 LAB — MAGNESIUM: Magnesium: 1.6 mg/dL — ABNORMAL LOW (ref 1.7–2.4)

## 2022-02-03 MED ORDER — CINACALCET 60 MG TABLET
ORAL_TABLET | Freq: Every day | ORAL | 3 refills | 90 days | Status: CP
Start: 2022-02-03 — End: 2023-02-03
  Filled 2022-02-04: qty 30, 30d supply, fill #0

## 2022-02-04 MED ORDER — LISINOPRIL 40 MG TABLET
ORAL_TABLET | 3 refills | 0 days
Start: 2022-02-04 — End: ?

## 2022-02-05 DIAGNOSIS — Z872 Personal history of diseases of the skin and subcutaneous tissue: Secondary | ICD-10-CM | POA: Diagnosis not present

## 2022-02-05 DIAGNOSIS — Z85828 Personal history of other malignant neoplasm of skin: Secondary | ICD-10-CM | POA: Diagnosis not present

## 2022-02-05 DIAGNOSIS — L570 Actinic keratosis: Secondary | ICD-10-CM | POA: Diagnosis not present

## 2022-02-05 DIAGNOSIS — Z859 Personal history of malignant neoplasm, unspecified: Secondary | ICD-10-CM | POA: Diagnosis not present

## 2022-02-05 DIAGNOSIS — Z86018 Personal history of other benign neoplasm: Secondary | ICD-10-CM | POA: Diagnosis not present

## 2022-02-19 DIAGNOSIS — Z940 Kidney transplant status: Principal | ICD-10-CM

## 2022-02-19 DIAGNOSIS — Z79899 Other long term (current) drug therapy: Principal | ICD-10-CM

## 2022-02-19 DIAGNOSIS — N189 Chronic kidney disease, unspecified: Principal | ICD-10-CM

## 2022-02-19 DIAGNOSIS — D631 Anemia in chronic kidney disease: Principal | ICD-10-CM

## 2022-02-19 DIAGNOSIS — Z1159 Encounter for screening for other viral diseases: Principal | ICD-10-CM

## 2022-02-20 DIAGNOSIS — H401133 Primary open-angle glaucoma, bilateral, severe stage: Secondary | ICD-10-CM | POA: Diagnosis not present

## 2022-03-03 MED FILL — CINACALCET 60 MG TABLET: ORAL | 30 days supply | Qty: 30 | Fill #1

## 2022-03-04 ENCOUNTER — Ambulatory Visit: Admit: 2022-03-04 | Discharge: 2022-03-04 | Payer: PRIVATE HEALTH INSURANCE

## 2022-03-04 ENCOUNTER — Ambulatory Visit
Admit: 2022-03-04 | Discharge: 2022-03-05 | Payer: PRIVATE HEALTH INSURANCE | Attending: Nephrology | Primary: Nephrology

## 2022-03-04 DIAGNOSIS — Z940 Kidney transplant status: Principal | ICD-10-CM

## 2022-03-04 DIAGNOSIS — N2581 Secondary hyperparathyroidism of renal origin: Principal | ICD-10-CM

## 2022-03-04 DIAGNOSIS — Z1159 Encounter for screening for other viral diseases: Principal | ICD-10-CM

## 2022-03-04 DIAGNOSIS — N1831 Anemia in stage 3a chronic kidney disease (CMS-HCC): Principal | ICD-10-CM

## 2022-03-04 DIAGNOSIS — D631 Anemia in chronic kidney disease: Principal | ICD-10-CM

## 2022-03-04 DIAGNOSIS — I10 Essential (primary) hypertension: Principal | ICD-10-CM

## 2022-03-04 DIAGNOSIS — Z79899 Other long term (current) drug therapy: Principal | ICD-10-CM

## 2022-03-04 DIAGNOSIS — N189 Chronic kidney disease, unspecified: Principal | ICD-10-CM

## 2022-03-04 DIAGNOSIS — D849 Immunodeficiency, unspecified: Principal | ICD-10-CM

## 2022-03-04 DIAGNOSIS — R634 Abnormal weight loss: Principal | ICD-10-CM

## 2022-03-04 DIAGNOSIS — R600 Localized edema: Secondary | ICD-10-CM | POA: Diagnosis not present

## 2022-03-04 DIAGNOSIS — K529 Noninfective gastroenteritis and colitis, unspecified: Secondary | ICD-10-CM | POA: Diagnosis not present

## 2022-03-04 DIAGNOSIS — I129 Hypertensive chronic kidney disease with stage 1 through stage 4 chronic kidney disease, or unspecified chronic kidney disease: Secondary | ICD-10-CM | POA: Diagnosis not present

## 2022-03-04 DIAGNOSIS — E785 Hyperlipidemia, unspecified: Secondary | ICD-10-CM | POA: Diagnosis not present

## 2022-03-04 DIAGNOSIS — E1022 Type 1 diabetes mellitus with diabetic chronic kidney disease: Secondary | ICD-10-CM | POA: Diagnosis not present

## 2022-03-04 DIAGNOSIS — I7090 Unspecified atherosclerosis: Secondary | ICD-10-CM | POA: Diagnosis not present

## 2022-03-04 DIAGNOSIS — Z7982 Long term (current) use of aspirin: Secondary | ICD-10-CM | POA: Diagnosis not present

## 2022-03-04 DIAGNOSIS — Z9641 Presence of insulin pump (external) (internal): Secondary | ICD-10-CM | POA: Diagnosis not present

## 2022-03-04 DIAGNOSIS — K6389 Other specified diseases of intestine: Secondary | ICD-10-CM | POA: Diagnosis not present

## 2022-03-04 DIAGNOSIS — K5790 Diverticulosis of intestine, part unspecified, without perforation or abscess without bleeding: Secondary | ICD-10-CM | POA: Diagnosis not present

## 2022-03-06 DIAGNOSIS — E785 Hyperlipidemia, unspecified: Secondary | ICD-10-CM | POA: Diagnosis not present

## 2022-03-06 DIAGNOSIS — E1159 Type 2 diabetes mellitus with other circulatory complications: Secondary | ICD-10-CM | POA: Diagnosis not present

## 2022-03-06 DIAGNOSIS — E1021 Type 1 diabetes mellitus with diabetic nephropathy: Secondary | ICD-10-CM | POA: Diagnosis not present

## 2022-03-06 DIAGNOSIS — E1069 Type 1 diabetes mellitus with other specified complication: Secondary | ICD-10-CM | POA: Diagnosis not present

## 2022-03-26 MED ORDER — AMLODIPINE 2.5 MG TABLET
ORAL_TABLET | 3 refills | 0 days | Status: CP
Start: 2022-03-26 — End: ?

## 2022-04-02 MED FILL — CINACALCET 60 MG TABLET: ORAL | 30 days supply | Qty: 30 | Fill #2

## 2022-04-13 DIAGNOSIS — Z940 Kidney transplant status: Principal | ICD-10-CM

## 2022-04-13 MED ORDER — PROGRAF 1 MG CAPSULE
ORAL_CAPSULE | 1 refills | 0 days | Status: CP
Start: 2022-04-13 — End: ?

## 2022-04-16 DIAGNOSIS — Z940 Kidney transplant status: Principal | ICD-10-CM

## 2022-04-16 MED ORDER — TACROLIMUS 1 MG CAPSULE, IMMEDIATE-RELEASE
ORAL_CAPSULE | 1 refills | 0 days | Status: CP
Start: 2022-04-16 — End: 2022-04-16

## 2022-04-20 DIAGNOSIS — Z940 Kidney transplant status: Principal | ICD-10-CM

## 2022-04-20 MED ORDER — ATORVASTATIN 20 MG TABLET
ORAL_TABLET | 3 refills | 0 days | Status: CP
Start: 2022-04-20 — End: ?

## 2022-05-01 MED FILL — CINACALCET 60 MG TABLET: ORAL | 30 days supply | Qty: 30 | Fill #3

## 2022-05-07 MED ORDER — OMEPRAZOLE 20 MG CAPSULE,DELAYED RELEASE
ORAL_CAPSULE | 3 refills | 0 days
Start: 2022-05-07 — End: ?

## 2022-06-04 MED FILL — CINACALCET 60 MG TABLET: ORAL | 30 days supply | Qty: 30 | Fill #4

## 2022-06-08 DIAGNOSIS — Z940 Kidney transplant status: Principal | ICD-10-CM

## 2022-06-15 ENCOUNTER — Other Ambulatory Visit
Admission: RE | Admit: 2022-06-15 | Discharge: 2022-06-15 | Disposition: A | Discharge status: Home / Self Care | Payer: BC Managed Care – PPO | Attending: Nephrology | Admitting: Nephrology

## 2022-06-15 DIAGNOSIS — Z09 Encounter for follow-up examination after completed treatment for conditions other than malignant neoplasm: Secondary | ICD-10-CM | POA: Diagnosis not present

## 2022-06-15 DIAGNOSIS — Z79899 Other long term (current) drug therapy: Secondary | ICD-10-CM | POA: Diagnosis not present

## 2022-06-15 DIAGNOSIS — D631 Anemia in chronic kidney disease: Secondary | ICD-10-CM | POA: Diagnosis not present

## 2022-06-15 DIAGNOSIS — D899 Disorder involving the immune mechanism, unspecified: Secondary | ICD-10-CM | POA: Diagnosis not present

## 2022-06-15 DIAGNOSIS — N390 Urinary tract infection, site not specified: Secondary | ICD-10-CM | POA: Diagnosis not present

## 2022-06-15 DIAGNOSIS — E559 Vitamin D deficiency, unspecified: Secondary | ICD-10-CM | POA: Insufficient documentation

## 2022-06-15 DIAGNOSIS — E1129 Type 2 diabetes mellitus with other diabetic kidney complication: Secondary | ICD-10-CM | POA: Insufficient documentation

## 2022-06-15 DIAGNOSIS — Z114 Encounter for screening for human immunodeficiency virus [HIV]: Secondary | ICD-10-CM | POA: Diagnosis not present

## 2022-06-15 DIAGNOSIS — B259 Cytomegaloviral disease, unspecified: Secondary | ICD-10-CM | POA: Insufficient documentation

## 2022-06-15 DIAGNOSIS — Z789 Other specified health status: Secondary | ICD-10-CM | POA: Diagnosis not present

## 2022-06-15 DIAGNOSIS — Z940 Kidney transplant status: Secondary | ICD-10-CM | POA: Diagnosis not present

## 2022-06-15 DIAGNOSIS — Z9483 Pancreas transplant status: Secondary | ICD-10-CM | POA: Insufficient documentation

## 2022-06-15 LAB — CBC WITH DIFFERENTIAL/PLATELET
Abs Immature Granulocytes: 0.09 10*3/uL — ABNORMAL HIGH (ref 0.00–0.07)
Basophils Absolute: 0.1 10*3/uL (ref 0.0–0.1)
Basophils Relative: 2 %
Eosinophils Absolute: 0.7 10*3/uL — ABNORMAL HIGH (ref 0.0–0.5)
Eosinophils Relative: 11 %
HCT: 33 % — ABNORMAL LOW (ref 39.0–52.0)
Hemoglobin: 10.3 g/dL — ABNORMAL LOW (ref 13.0–17.0)
Immature Granulocytes: 2 %
Lymphocytes Relative: 18 %
Lymphs Abs: 1.1 10*3/uL (ref 0.7–4.0)
MCH: 31.2 pg (ref 26.0–34.0)
MCHC: 31.2 g/dL (ref 30.0–36.0)
MCV: 100 fL (ref 80.0–100.0)
Monocytes Absolute: 0.5 10*3/uL (ref 0.1–1.0)
Monocytes Relative: 8 %
Neutro Abs: 3.6 10*3/uL (ref 1.7–7.7)
Neutrophils Relative %: 59 %
Platelets: 84 10*3/uL — ABNORMAL LOW (ref 150–400)
RBC: 3.3 MIL/uL — ABNORMAL LOW (ref 4.22–5.81)
RDW: 13.1 % (ref 11.5–15.5)
WBC: 6.1 10*3/uL (ref 4.0–10.5)
nRBC: 0 % (ref 0.0–0.2)

## 2022-06-15 LAB — BASIC METABOLIC PANEL WITH GFR
Anion gap: 6 (ref 5–15)
BUN: 48 mg/dL — ABNORMAL HIGH (ref 8–23)
CO2: 22 mmol/L (ref 22–32)
Calcium: 9.7 mg/dL (ref 8.9–10.3)
Chloride: 112 mmol/L — ABNORMAL HIGH (ref 98–111)
Creatinine, Ser: 1.51 mg/dL — ABNORMAL HIGH (ref 0.61–1.24)
GFR, Estimated: 52 mL/min — ABNORMAL LOW
Glucose, Bld: 118 mg/dL — ABNORMAL HIGH (ref 70–99)
Potassium: 4.8 mmol/L (ref 3.5–5.1)
Sodium: 140 mmol/L (ref 135–145)

## 2022-06-15 LAB — PHOSPHORUS: Phosphorus: 3.5 mg/dL (ref 2.5–4.6)

## 2022-06-15 LAB — MAGNESIUM: Magnesium: 1.7 mg/dL (ref 1.7–2.4)

## 2022-06-22 DIAGNOSIS — H401133 Primary open-angle glaucoma, bilateral, severe stage: Secondary | ICD-10-CM | POA: Diagnosis not present

## 2022-06-26 DIAGNOSIS — H401133 Primary open-angle glaucoma, bilateral, severe stage: Secondary | ICD-10-CM | POA: Diagnosis not present

## 2022-07-10 DIAGNOSIS — Z940 Kidney transplant status: Principal | ICD-10-CM

## 2022-07-10 MED ORDER — PROGRAF 1 MG CAPSULE
ORAL_CAPSULE | 3 refills | 0 days
Start: 2022-07-10 — End: ?

## 2022-07-10 MED ORDER — OMEPRAZOLE 20 MG CAPSULE,DELAYED RELEASE
ORAL_CAPSULE | Freq: Every day | ORAL | 3 refills | 90 days | Status: CP
Start: 2022-07-10 — End: 2023-07-10

## 2022-07-10 MED FILL — CINACALCET 60 MG TABLET: ORAL | 30 days supply | Qty: 30 | Fill #5

## 2022-07-16 DIAGNOSIS — Z940 Kidney transplant status: Principal | ICD-10-CM

## 2022-07-16 MED ORDER — TACROLIMUS 1 MG CAPSULE, IMMEDIATE-RELEASE
ORAL_CAPSULE | ORAL | 3 refills | 0.00000 days | Status: CP
Start: 2022-07-16 — End: 2022-07-16

## 2022-07-21 DIAGNOSIS — Z940 Kidney transplant status: Principal | ICD-10-CM

## 2022-07-21 MED ORDER — TACROLIMUS 1 MG CAPSULE, IMMEDIATE-RELEASE
ORAL_CAPSULE | Freq: Two times a day (BID) | ORAL | 0 refills | 30 days | Status: CP
Start: 2022-07-21 — End: 2022-08-20

## 2022-08-05 MED ORDER — OMEPRAZOLE 20 MG CAPSULE,DELAYED RELEASE
ORAL_CAPSULE | 3 refills | 0 days
Start: 2022-08-05 — End: ?

## 2022-08-10 DIAGNOSIS — E785 Hyperlipidemia, unspecified: Secondary | ICD-10-CM | POA: Diagnosis not present

## 2022-08-10 DIAGNOSIS — E1021 Type 1 diabetes mellitus with diabetic nephropathy: Secondary | ICD-10-CM | POA: Diagnosis not present

## 2022-08-10 DIAGNOSIS — I152 Hypertension secondary to endocrine disorders: Secondary | ICD-10-CM | POA: Diagnosis not present

## 2022-08-10 DIAGNOSIS — E1069 Type 1 diabetes mellitus with other specified complication: Secondary | ICD-10-CM | POA: Diagnosis not present

## 2022-08-11 DIAGNOSIS — Z85828 Personal history of other malignant neoplasm of skin: Secondary | ICD-10-CM | POA: Diagnosis not present

## 2022-08-11 DIAGNOSIS — Z86018 Personal history of other benign neoplasm: Secondary | ICD-10-CM | POA: Diagnosis not present

## 2022-08-11 DIAGNOSIS — Z859 Personal history of malignant neoplasm, unspecified: Secondary | ICD-10-CM | POA: Diagnosis not present

## 2022-08-11 DIAGNOSIS — L570 Actinic keratosis: Secondary | ICD-10-CM | POA: Diagnosis not present

## 2022-08-11 DIAGNOSIS — L578 Other skin changes due to chronic exposure to nonionizing radiation: Secondary | ICD-10-CM | POA: Diagnosis not present

## 2022-08-12 MED FILL — CINACALCET 60 MG TABLET: ORAL | 30 days supply | Qty: 30 | Fill #6

## 2022-08-14 ENCOUNTER — Other Ambulatory Visit
Admission: RE | Admit: 2022-08-14 | Discharge: 2022-08-14 | Disposition: A | Discharge status: Home / Self Care | Payer: BC Managed Care – PPO | Attending: Nephrology | Admitting: Nephrology

## 2022-08-14 DIAGNOSIS — D899 Disorder involving the immune mechanism, unspecified: Secondary | ICD-10-CM | POA: Diagnosis not present

## 2022-08-14 DIAGNOSIS — N189 Chronic kidney disease, unspecified: Secondary | ICD-10-CM | POA: Insufficient documentation

## 2022-08-14 DIAGNOSIS — N390 Urinary tract infection, site not specified: Secondary | ICD-10-CM | POA: Diagnosis not present

## 2022-08-14 DIAGNOSIS — D631 Anemia in chronic kidney disease: Secondary | ICD-10-CM | POA: Diagnosis not present

## 2022-08-14 DIAGNOSIS — Z940 Kidney transplant status: Secondary | ICD-10-CM | POA: Diagnosis not present

## 2022-08-14 DIAGNOSIS — Z9483 Pancreas transplant status: Secondary | ICD-10-CM | POA: Insufficient documentation

## 2022-08-14 DIAGNOSIS — Z114 Encounter for screening for human immunodeficiency virus [HIV]: Secondary | ICD-10-CM | POA: Insufficient documentation

## 2022-08-14 DIAGNOSIS — Z79899 Other long term (current) drug therapy: Secondary | ICD-10-CM | POA: Insufficient documentation

## 2022-08-14 DIAGNOSIS — B259 Cytomegaloviral disease, unspecified: Secondary | ICD-10-CM | POA: Diagnosis not present

## 2022-08-14 DIAGNOSIS — E1129 Type 2 diabetes mellitus with other diabetic kidney complication: Secondary | ICD-10-CM | POA: Diagnosis not present

## 2022-08-14 LAB — CBC WITH DIFFERENTIAL/PLATELET
Abs Immature Granulocytes: 0.15 10*3/uL — ABNORMAL HIGH (ref 0.00–0.07)
Basophils Absolute: 0.1 10*3/uL (ref 0.0–0.1)
Basophils Relative: 1 %
Eosinophils Absolute: 0.9 10*3/uL — ABNORMAL HIGH (ref 0.0–0.5)
Eosinophils Relative: 16 %
HCT: 30.1 % — ABNORMAL LOW (ref 39.0–52.0)
Hemoglobin: 9.3 g/dL — ABNORMAL LOW (ref 13.0–17.0)
Immature Granulocytes: 3 %
Lymphocytes Relative: 22 %
Lymphs Abs: 1.2 10*3/uL (ref 0.7–4.0)
MCH: 31 pg (ref 26.0–34.0)
MCHC: 30.9 g/dL (ref 30.0–36.0)
MCV: 100.3 fL — ABNORMAL HIGH (ref 80.0–100.0)
Monocytes Absolute: 0.5 10*3/uL (ref 0.1–1.0)
Monocytes Relative: 9 %
Neutro Abs: 2.7 10*3/uL (ref 1.7–7.7)
Neutrophils Relative %: 49 %
Platelets: 104 10*3/uL — ABNORMAL LOW (ref 150–400)
RBC: 3 MIL/uL — ABNORMAL LOW (ref 4.22–5.81)
RDW: 13.2 % (ref 11.5–15.5)
WBC: 5.5 10*3/uL (ref 4.0–10.5)
nRBC: 0 % (ref 0.0–0.2)

## 2022-08-14 LAB — BASIC METABOLIC PANEL
Anion gap: 3 — ABNORMAL LOW (ref 5–15)
BUN: 45 mg/dL — ABNORMAL HIGH (ref 8–23)
CO2: 23 mmol/L (ref 22–32)
Calcium: 9.3 mg/dL (ref 8.9–10.3)
Chloride: 113 mmol/L — ABNORMAL HIGH (ref 98–111)
Creatinine, Ser: 1.36 mg/dL — ABNORMAL HIGH (ref 0.61–1.24)
GFR, Estimated: 58 mL/min — ABNORMAL LOW (ref >60)
Glucose, Bld: 110 mg/dL — ABNORMAL HIGH (ref 70–99)
Potassium: 5.1 mmol/L (ref 3.5–5.1)
Sodium: 139 mmol/L (ref 135–145)

## 2022-08-14 LAB — MAGNESIUM: Magnesium: 1.8 mg/dL (ref 1.7–2.4)

## 2022-08-14 LAB — PHOSPHORUS: Phosphorus: 3.6 mg/dL (ref 2.5–4.6)

## 2022-08-25 MED ORDER — PROGRAF 1 MG CAPSULE
ORAL_CAPSULE | 3 refills | 0 days
Start: 2022-08-25 — End: ?

## 2022-09-04 MED ORDER — FUROSEMIDE 20 MG TABLET
ORAL_TABLET | ORAL | 3 refills | 90 days | Status: CP
Start: 2022-09-04 — End: 2023-09-04

## 2022-09-08 MED FILL — CINACALCET 60 MG TABLET: ORAL | 30 days supply | Qty: 30 | Fill #7

## 2022-09-26 DIAGNOSIS — Z940 Kidney transplant status: Principal | ICD-10-CM

## 2022-09-26 DIAGNOSIS — D369 Benign neoplasm, unspecified site: Principal | ICD-10-CM

## 2022-09-26 MED ORDER — MYCOPHENOLATE SODIUM 360 MG TABLET,DELAYED RELEASE
ORAL_TABLET | ORAL | 3 refills | 0 days
Start: 2022-09-26 — End: ?

## 2022-09-29 MED ORDER — MYCOPHENOLATE SODIUM 360 MG TABLET,DELAYED RELEASE
ORAL_TABLET | ORAL | 3 refills | 0 days | Status: CP
Start: 2022-09-29 — End: ?

## 2022-10-05 DIAGNOSIS — Z1159 Encounter for screening for other viral diseases: Principal | ICD-10-CM

## 2022-10-05 DIAGNOSIS — N186 End stage renal disease: Principal | ICD-10-CM

## 2022-10-05 DIAGNOSIS — Z940 Kidney transplant status: Principal | ICD-10-CM

## 2022-10-05 DIAGNOSIS — Z79899 Other long term (current) drug therapy: Principal | ICD-10-CM

## 2022-10-07 ENCOUNTER — Ambulatory Visit: Admit: 2022-10-07 | Discharge: 2022-10-08 | Payer: PRIVATE HEALTH INSURANCE

## 2022-10-07 ENCOUNTER — Ambulatory Visit
Admit: 2022-10-07 | Discharge: 2022-10-08 | Payer: PRIVATE HEALTH INSURANCE | Attending: Nephrology | Primary: Nephrology

## 2022-10-07 DIAGNOSIS — Z940 Kidney transplant status: Principal | ICD-10-CM

## 2022-10-07 DIAGNOSIS — N186 End stage renal disease: Principal | ICD-10-CM

## 2022-10-07 DIAGNOSIS — Z1159 Encounter for screening for other viral diseases: Principal | ICD-10-CM

## 2022-10-07 DIAGNOSIS — Z79899 Other long term (current) drug therapy: Principal | ICD-10-CM

## 2022-10-07 DIAGNOSIS — D849 Immunodeficiency, unspecified: Secondary | ICD-10-CM | POA: Diagnosis not present

## 2022-10-07 DIAGNOSIS — N189 Chronic kidney disease, unspecified: Secondary | ICD-10-CM | POA: Diagnosis not present

## 2022-10-07 DIAGNOSIS — N200 Calculus of kidney: Secondary | ICD-10-CM | POA: Diagnosis not present

## 2022-10-07 MED ORDER — OMEPRAZOLE 20 MG CAPSULE,DELAYED RELEASE
ORAL_CAPSULE | Freq: Every day | ORAL | 3 refills | 90 days | Status: CP | PRN
Start: 2022-10-07 — End: 2023-10-07

## 2022-10-09 MED FILL — CINACALCET 60 MG TABLET: ORAL | 30 days supply | Qty: 30 | Fill #8

## 2022-10-23 DIAGNOSIS — Z0100 Encounter for examination of eyes and vision without abnormal findings: Secondary | ICD-10-CM | POA: Diagnosis not present

## 2022-10-23 DIAGNOSIS — H401133 Primary open-angle glaucoma, bilateral, severe stage: Secondary | ICD-10-CM | POA: Diagnosis not present

## 2022-11-11 MED FILL — CINACALCET 60 MG TABLET: ORAL | 30 days supply | Qty: 30 | Fill #9

## 2022-11-25 ENCOUNTER — Ambulatory Visit (INDEPENDENT_AMBULATORY_CARE_PROVIDER_SITE_OTHER): Payer: BC Managed Care – PPO | Admitting: Family Medicine

## 2022-11-25 ENCOUNTER — Encounter: Payer: Self-pay | Admitting: Family Medicine

## 2022-11-25 VITALS — BP 137/72 | HR 68 | Temp 98.4°F | Resp 16 | Ht 70.0 in | Wt 151.0 lb

## 2022-11-25 DIAGNOSIS — J0140 Acute pansinusitis, unspecified: Secondary | ICD-10-CM | POA: Insufficient documentation

## 2022-11-25 MED ORDER — AMOXICILLIN-POT CLAVULANATE 875-125 MG PO TABS: 1.0000 | ORAL_TABLET | Freq: Two times a day (BID) | ORAL | 0 refills | Status: DC

## 2022-11-25 NOTE — Progress Notes (Signed)
Established patient visit   Patient: Rick Wang   DOB: [birth year]   66 y.o. Male  MRN: [id number] Visit Date: 11/25/2022  Today's healthcare provider: Gwyneth Sprout, FNP  Introduced to nurse practitioner role and practice setting.  All questions answered.  Discussed provider/patient relationship and expectations.  I,Tiffany J Bragg,acting as a scribe for Gwyneth Sprout, FNP.,have documented all relevant documentation on the behalf of Gwyneth Sprout, FNP,as directed by  Gwyneth Sprout, FNP while in the presence of Gwyneth Sprout, FNP.   Chief Complaint  Patient presents with   Cough    Patient complains of cough, chest congestion, sinus drainage, fever for 4 days. Did at home covid test with negative results. States people at work have been sick. Does not want flu test.    Subjective    HPI HPI     Cough    Additional comments: Patient complains of cough, chest congestion, sinus drainage, fever for 4 days. Did at home covid test with negative results. States people at work have been sick. Does not want flu test.       Last edited by Smitty Knudsen, CMA on 11/25/2022  3:13 PM.      Medications: Outpatient Medications Prior to Visit  Medication Sig   apixaban (ELIQUIS) 2.5 MG TABS tablet Take by mouth.   aspirin 81 MG tablet Take by mouth.   atorvastatin (LIPITOR) 20 MG tablet Take by mouth.   Brinzolamide-Brimonidine (SIMBRINZA) 1-0.2 % SUSP Apply to eye.   docusate sodium (COLACE) 100 MG capsule Take by mouth.   ferrous sulfate 325 (65 FE) MG tablet Take by mouth.   insulin aspart (NOVOLOG) 100 UNIT/ML injection INJECT 7 UNITS UNDER THE SKIN BEFORE BREAKFAST, 5 UNITS BEFORE LUNCH AND 4 UNITS BEFORE DINNER   Insulin Disposable Pump (OMNIPOD 5 G6 INTRO, GEN 5,) KIT Inject into the skin.   Insulin Glargine (TOUJEO SOLOSTAR) 300 UNIT/ML SOPN Inject 16 Units into the skin daily.    Insulin Pen Needle (BD PEN NEEDLE NANO U/F) 32G X 4 MM MISC USE 1 EACH ONCE DAILY    Insulin Syringe-Needle U-100 (BD INSULIN SYRINGE ULTRAFINE) 31G X 5/16" 0.5 ML MISC    mycophenolate (MYFORTIC) 180 MG EC tablet Take by mouth.   NOVOLOG 100 UNIT/ML injection INJECT 7 UNITS UNDER THE SKIN BEFORE BREAKFAST, 5 UNITS BEFORE LUNCH AND 4 UNITS BEFORE DINNER   ONETOUCH DELICA LANCETS FINE MISC    Polyethylene Glycol 3350 GRAN Take by mouth.   SENSIPAR 60 MG tablet    tacrolimus (PROGRAF) 1 MG capsule    furosemide (LASIX) 20 MG tablet Take 20 mg by mouth.   lisinopril (PRINIVIL,ZESTRIL) 40 MG tablet Take 40 mg by mouth.   No facility-administered medications prior to visit.    Review of Systems   Objective    BP 137/72 (BP Location: Right Arm, Patient Position: Sitting, Cuff Size: Normal)   Pulse 68   Temp 98.4 F (36.9 C) (Oral)   Resp 16   Ht _0  (1.778 m)   Wt 151 lb (68.5 kg)   SpO2 98%   BMI 21.67 kg/m   Physical Exam Constitutional:      Appearance: Normal appearance. He is normal weight.  HENT:     Head: Normocephalic and atraumatic.     Right Ear: Tympanic membrane, ear canal and external ear normal.     Left Ear: Tympanic membrane, ear canal and external ear normal.  Nose: Congestion present.     Mouth/Throat:     Mouth: Mucous membranes are moist.     Pharynx: Oropharynx is clear. No oropharyngeal exudate or posterior oropharyngeal erythema.  Eyes:     Conjunctiva/sclera: Conjunctivae normal.     Pupils: Pupils are equal, round, and reactive to light.  Cardiovascular:     Rate and Rhythm: Normal rate and regular rhythm.     Pulses: Normal pulses.     Heart sounds: Normal heart sounds.  Musculoskeletal:        General: Normal range of motion.     Cervical back: Normal range of motion and neck supple. No rigidity or tenderness.  Skin:    General: Skin is warm and dry.     Coloration: Skin is not pale.  Neurological:     General: No focal deficit present.     Mental Status: He is alert and oriented to person, place, and time.   Psychiatric:        Mood and Affect: Mood normal.        Behavior: Behavior normal.        Thought Content: Thought content normal.        Judgment: Judgment normal.     No results found for any visits on 11/25/22.  Assessment & Plan     Problem List Items Addressed This Visit       Respiratory   Acute non-recurrent pansinusitis - Primary    Acute, stable 4 days of symptoms No known sick contacts Hx of kidney transplant- will treat <7 day of s/s with temps 101.57F Continue supportive medications as discussed with transplant coordinator RTC or seek emergent care if needed      Relevant Medications   amoxicillin-clavulanate (AUGMENTIN) 875-125 MG tablet   Return if symptoms worsen or fail to improve.     Vonna Kotyk, FNP, have reviewed all documentation for this visit. The documentation on 11/25/22 for the exam, diagnosis, procedures, and orders are all accurate and complete.  Gwyneth Sprout, [place] [phone] (phone) [phone] (fax)  [place]

## 2022-11-25 NOTE — Assessment & Plan Note (Signed)
Acute, stable 4 days of symptoms No known sick contacts Hx of kidney transplant- will treat <7 day of s/s with temps 101.61F Continue supportive medications as discussed with transplant coordinator RTC or seek emergent care if needed

## 2022-12-04 ENCOUNTER — Other Ambulatory Visit
Admission: RE | Admit: 2022-12-04 | Discharge: 2022-12-04 | Disposition: A | Discharge status: Home / Self Care | Payer: BC Managed Care – PPO | Attending: Nephrology | Admitting: Nephrology

## 2022-12-04 DIAGNOSIS — Z9483 Pancreas transplant status: Secondary | ICD-10-CM | POA: Insufficient documentation

## 2022-12-04 DIAGNOSIS — Z940 Kidney transplant status: Secondary | ICD-10-CM | POA: Diagnosis not present

## 2022-12-04 DIAGNOSIS — Z79899 Other long term (current) drug therapy: Secondary | ICD-10-CM | POA: Insufficient documentation

## 2022-12-04 DIAGNOSIS — E1129 Type 2 diabetes mellitus with other diabetic kidney complication: Secondary | ICD-10-CM | POA: Diagnosis not present

## 2022-12-04 DIAGNOSIS — B259 Cytomegaloviral disease, unspecified: Secondary | ICD-10-CM | POA: Diagnosis not present

## 2022-12-04 DIAGNOSIS — D631 Anemia in chronic kidney disease: Secondary | ICD-10-CM | POA: Insufficient documentation

## 2022-12-04 DIAGNOSIS — N186 End stage renal disease: Secondary | ICD-10-CM | POA: Diagnosis not present

## 2022-12-04 DIAGNOSIS — Z789 Other specified health status: Secondary | ICD-10-CM | POA: Diagnosis not present

## 2022-12-04 DIAGNOSIS — Z1159 Encounter for screening for other viral diseases: Secondary | ICD-10-CM | POA: Diagnosis not present

## 2022-12-04 DIAGNOSIS — D899 Disorder involving the immune mechanism, unspecified: Secondary | ICD-10-CM | POA: Insufficient documentation

## 2022-12-04 LAB — CBC WITH DIFFERENTIAL/PLATELET
Abs Immature Granulocytes: 0.1 10*3/uL — ABNORMAL HIGH (ref 0.00–0.07)
Basophils Absolute: 0.1 10*3/uL (ref 0.0–0.1)
Basophils Relative: 1 %
Eosinophils Absolute: 0.5 10*3/uL (ref 0.0–0.5)
Eosinophils Relative: 9 %
HCT: 31.6 % — ABNORMAL LOW (ref 39.0–52.0)
Hemoglobin: 9.8 g/dL — ABNORMAL LOW (ref 13.0–17.0)
Immature Granulocytes: 2 %
Lymphocytes Relative: 23 %
Lymphs Abs: 1.3 10*3/uL (ref 0.7–4.0)
MCH: 30.9 pg (ref 26.0–34.0)
MCHC: 31 g/dL (ref 30.0–36.0)
MCV: 99.7 fL (ref 80.0–100.0)
Monocytes Absolute: 0.5 10*3/uL (ref 0.1–1.0)
Monocytes Relative: 9 %
Neutro Abs: 3.3 10*3/uL (ref 1.7–7.7)
Neutrophils Relative %: 56 %
Platelets: 297 10*3/uL (ref 150–400)
RBC: 3.17 MIL/uL — ABNORMAL LOW (ref 4.22–5.81)
RDW: 13.3 % (ref 11.5–15.5)
WBC: 5.8 10*3/uL (ref 4.0–10.5)
nRBC: 0 % (ref 0.0–0.2)

## 2022-12-04 LAB — BASIC METABOLIC PANEL
Anion gap: 5 (ref 5–15)
BUN: 32 mg/dL — ABNORMAL HIGH (ref 8–23)
CO2: 26 mmol/L (ref 22–32)
Calcium: 9.2 mg/dL (ref 8.9–10.3)
Chloride: 108 mmol/L (ref 98–111)
Creatinine, Ser: 1.41 mg/dL — ABNORMAL HIGH (ref 0.61–1.24)
GFR, Estimated: 56 mL/min — ABNORMAL LOW (ref >60)
Glucose, Bld: 164 mg/dL — ABNORMAL HIGH (ref 70–99)
Potassium: 5 mmol/L (ref 3.5–5.1)
Sodium: 139 mmol/L (ref 135–145)

## 2022-12-04 LAB — MAGNESIUM: Magnesium: 1.6 mg/dL — ABNORMAL LOW (ref 1.7–2.4)

## 2022-12-04 LAB — PHOSPHORUS: Phosphorus: 3.2 mg/dL (ref 2.5–4.6)

## 2022-12-11 MED FILL — CINACALCET 60 MG TABLET: ORAL | 30 days supply | Qty: 30 | Fill #10

## 2022-12-21 ENCOUNTER — Other Ambulatory Visit
Admission: RE | Admit: 2022-12-21 | Discharge: 2022-12-21 | Disposition: A | Discharge status: Home / Self Care | Payer: BC Managed Care – PPO | Attending: Nephrology | Admitting: Nephrology

## 2022-12-21 ENCOUNTER — Ambulatory Visit: Admit: 2022-12-21 | Discharge: 2022-12-22 | Payer: PRIVATE HEALTH INSURANCE

## 2022-12-21 DIAGNOSIS — D631 Anemia in chronic kidney disease: Secondary | ICD-10-CM | POA: Diagnosis not present

## 2022-12-21 DIAGNOSIS — Z1159 Encounter for screening for other viral diseases: Secondary | ICD-10-CM | POA: Diagnosis not present

## 2022-12-21 DIAGNOSIS — Z940 Kidney transplant status: Secondary | ICD-10-CM | POA: Diagnosis not present

## 2022-12-21 DIAGNOSIS — D899 Disorder involving the immune mechanism, unspecified: Secondary | ICD-10-CM | POA: Insufficient documentation

## 2022-12-21 DIAGNOSIS — Z114 Encounter for screening for human immunodeficiency virus [HIV]: Secondary | ICD-10-CM | POA: Insufficient documentation

## 2022-12-21 DIAGNOSIS — Z9483 Pancreas transplant status: Secondary | ICD-10-CM | POA: Insufficient documentation

## 2022-12-21 DIAGNOSIS — E559 Vitamin D deficiency, unspecified: Secondary | ICD-10-CM | POA: Insufficient documentation

## 2022-12-21 DIAGNOSIS — Z09 Encounter for follow-up examination after completed treatment for conditions other than malignant neoplasm: Secondary | ICD-10-CM | POA: Diagnosis not present

## 2022-12-21 DIAGNOSIS — E1129 Type 2 diabetes mellitus with other diabetic kidney complication: Secondary | ICD-10-CM | POA: Insufficient documentation

## 2022-12-21 DIAGNOSIS — N390 Urinary tract infection, site not specified: Secondary | ICD-10-CM | POA: Diagnosis not present

## 2022-12-21 DIAGNOSIS — Z789 Other specified health status: Secondary | ICD-10-CM | POA: Diagnosis not present

## 2022-12-21 DIAGNOSIS — Z79899 Other long term (current) drug therapy: Secondary | ICD-10-CM | POA: Insufficient documentation

## 2022-12-21 DIAGNOSIS — B259 Cytomegaloviral disease, unspecified: Secondary | ICD-10-CM | POA: Diagnosis not present

## 2022-12-21 LAB — BASIC METABOLIC PANEL
Anion gap: 7 (ref 5–15)
BUN: 39 mg/dL — ABNORMAL HIGH (ref 8–23)
CO2: 21 mmol/L — ABNORMAL LOW (ref 22–32)
Calcium: 9.1 mg/dL (ref 8.9–10.3)
Chloride: 110 mmol/L (ref 98–111)
Creatinine, Ser: 1.31 mg/dL — ABNORMAL HIGH (ref 0.61–1.24)
GFR, Estimated: >60 mL/min (ref >60)
Glucose, Bld: 147 mg/dL — ABNORMAL HIGH (ref 70–99)
Potassium: 4.4 mmol/L (ref 3.5–5.1)
Sodium: 138 mmol/L (ref 135–145)

## 2022-12-21 LAB — CBC WITH DIFFERENTIAL/PLATELET
Abs Immature Granulocytes: 0.1 10*3/uL — ABNORMAL HIGH (ref 0.00–0.07)
Basophils Absolute: 0.1 10*3/uL (ref 0.0–0.1)
Basophils Relative: 2 %
Eosinophils Absolute: 1.3 10*3/uL — ABNORMAL HIGH (ref 0.0–0.5)
Eosinophils Relative: 18 %
HCT: 32.1 % — ABNORMAL LOW (ref 39.0–52.0)
Hemoglobin: 9.9 g/dL — ABNORMAL LOW (ref 13.0–17.0)
Immature Granulocytes: 2 %
Lymphocytes Relative: 23 %
Lymphs Abs: 1.6 10*3/uL (ref 0.7–4.0)
MCH: 31 pg (ref 26.0–34.0)
MCHC: 30.8 g/dL (ref 30.0–36.0)
MCV: 100.6 fL — ABNORMAL HIGH (ref 80.0–100.0)
Monocytes Absolute: 0.6 10*3/uL (ref 0.1–1.0)
Monocytes Relative: 9 %
Neutro Abs: 3.2 10*3/uL (ref 1.7–7.7)
Neutrophils Relative %: 46 %
Platelets: 128 10*3/uL — ABNORMAL LOW (ref 150–400)
RBC: 3.19 MIL/uL — ABNORMAL LOW (ref 4.22–5.81)
RDW: 13.8 % (ref 11.5–15.5)
WBC: 6.9 10*3/uL (ref 4.0–10.5)
nRBC: 0 % (ref 0.0–0.2)

## 2022-12-21 LAB — PHOSPHORUS: Phosphorus: 3.5 mg/dL (ref 2.5–4.6)

## 2022-12-21 LAB — MAGNESIUM: Magnesium: 1.6 mg/dL — ABNORMAL LOW (ref 1.7–2.4)

## 2022-12-24 DIAGNOSIS — Z940 Kidney transplant status: Principal | ICD-10-CM

## 2022-12-24 DIAGNOSIS — Z1159 Encounter for screening for other viral diseases: Principal | ICD-10-CM

## 2022-12-24 DIAGNOSIS — Z79899 Other long term (current) drug therapy: Principal | ICD-10-CM

## 2022-12-31 MED ORDER — LISINOPRIL 40 MG TABLET
ORAL_TABLET | Freq: Every day | ORAL | 3 refills | 90 days | Status: CP
Start: 2022-12-31 — End: ?

## 2023-01-11 DIAGNOSIS — E1021 Type 1 diabetes mellitus with diabetic nephropathy: Secondary | ICD-10-CM | POA: Diagnosis not present

## 2023-01-11 DIAGNOSIS — E785 Hyperlipidemia, unspecified: Secondary | ICD-10-CM | POA: Diagnosis not present

## 2023-01-11 DIAGNOSIS — I152 Hypertension secondary to endocrine disorders: Secondary | ICD-10-CM | POA: Diagnosis not present

## 2023-01-11 DIAGNOSIS — E1069 Type 1 diabetes mellitus with other specified complication: Secondary | ICD-10-CM | POA: Diagnosis not present

## 2023-01-11 MED FILL — CINACALCET 60 MG TABLET: ORAL | 30 days supply | Qty: 30 | Fill #11

## 2023-01-19 DIAGNOSIS — D369 Benign neoplasm, unspecified site: Principal | ICD-10-CM

## 2023-01-19 DIAGNOSIS — Z940 Kidney transplant status: Principal | ICD-10-CM

## 2023-01-19 MED ORDER — ATORVASTATIN 20 MG TABLET
ORAL_TABLET | Freq: Every evening | ORAL | 0 refills | 30 days | Status: CP
Start: 2023-01-19 — End: 2023-02-18
  Filled 2023-01-21: qty 30, 30d supply, fill #0

## 2023-01-19 MED ORDER — TACROLIMUS 1 MG CAPSULE, IMMEDIATE-RELEASE
ORAL_CAPSULE | Freq: Two times a day (BID) | ORAL | 0 refills | 30 days | Status: CP
Start: 2023-01-19 — End: 2023-02-18
  Filled 2023-01-21: qty 180, 30d supply, fill #0

## 2023-01-20 NOTE — Unmapped (Signed)
[facility] Shared [facility] Clinical Intervention    Type of intervention: Narrow therapeutic index drug    Medication involved: tacrolimus    Problem identified: Patient has been getting tacrolimus from another pharmacy and we use a different mfg than he has been taking.    Intervention performed: Spoke to Steven Cooley and told him to finish up the tacrolimus he has been getting from the other pharmacy first then start taking the new mfg of tacrolimus he is receiving from shared services.   Told him to give the clinic a call when he starts the new mfg to schedule labs.  Pt acknowledges understanding.    Follow-up needed: Set up for clinical call in 3 weeks to see how things are going.    Approximate time spent: 5-10 minutes    Clinical evidence used to support intervention: FDA Orange Book    Result of the intervention: Improved therapy effectiveness    Tera Helper, [facility]   [facility] Shared [facility] Specialty Pharmacist

## 2023-01-20 NOTE — Unmapped (Addendum)
Spoke to Steven Cooley and told him to finish up the tacrolimus he has been getting from the other pharmacy first then start taking the new mfg of tacrolimus he is receiving from shared services.   Told him to give the clinic a call when he starts the new mfg to schedule labs.  Pt acknowledges understanding.        This onboarding is for the following medication:  1) Prograf        [facility] Shared [facility]   Patient Onboarding/Medication Counseling    Steven Cooley is a 67 y.o. male with a kidney transplant who I am counseling today on continuation of therapy.  I am speaking to the patient.    Was a Nurse, learning disability used for this call? No    Verified patient's date of birth / HIPAA.    Specialty medication(s) to be sent: Transplant: tacrolimus 1mg       Non-specialty medications/supplies to be sent: atorvastatin      Medications not needed at this time: none       The patient declined counseling on missed dose instructions, goals of therapy, side effects and monitoring parameters, warnings and precautions, drug/food interactions, and storage, handling precautions, and disposal because they have taken the medication previously. The information in the declined sections below are for informational purposes only and was not discussed with patient.     Prograf (tacrolimus)    Medication & Administration     Dosage: Take 3 capsules (3mg  total) two times a day.     Administration:   May take with or without food  Take 12 hours apart    Adherence/Missed dose instructions:  Take a missed dose as soon as you think about it.  If it is close to the time for your next dose, skip the missed dose and go back to your normal time.  Do not take 2 doses at the same time or extra doses.    Goals of Therapy     To prevent organ rejection    Side Effects & Monitoring Parameters     Common side effects  Dizziness  Fatigue  Headache  Stuffy nose or sore throat  Nausea, vomiting, stomach pain, diarrhea, constipation  Heartburn  Back or joint pain  Increased risk of infection    The following side effects should be reported to the provider:  Allergic reaction  Kidney issues (change in quantity or urine passed, blood in urine, or weight gain)  High blood pressure (dizziness, change in eyesight, headache)  Electrolyte issues (change in mood, confusion, muscle pain, or weakness)  Abnormal breathing  Shakiness  Unexplained bleeding or bruising (gums bleeding, blood in urine, nosebleeds, any abnormal bleeding)  Signs of infection (fever, cough, wounds that will not heal)  Skin changes (sores, paleness, new or changed bumps or moles)    Monitoring Parameters  Renal function  Liver function  Glucose levels  Blood pressure  Tacrolimus trough levels  Cardiac monitoring (for QT prolongation)      Contraindications, Warnings, & Precautions     Black Box Warning: Infections - immunosuppressant agents increase the risk of infection that may lead to hospitalization or death  Black Box Warning: Malignancy - immunosuppressant agents may be associated with the development of malignancies that may lead to hospitalization or death  Limit or avoid sun and ultraviolet light exposure, use appropriate sun protection  Myocardial hypertrophy -avoid use in patients with congenital long QT syndrome  Diabetes mellitus - the risk for new-onset diabetes  and insulin-dependent post-transplant diabetes mellitus is increased with tacrolimus use after transplantation  GI perforation  Hyperkalemia  Hypertension  Nephrotoxicity  Neurotoxicity  This is a narrow therapeutic index drug. Do not switch manufacturers without first talking to the provider.    Drug/Food Interactions     Medication list reviewed in Epic. The patient was instructed to inform the care team before taking any new medications or supplements. No drug interactions identified.   Avoid alcohol  Avoid grapefruit or grapefruit juice  Avoid live vaccines    Storage, Handling Precautions, & Disposal     Store at room temperature  Keep away from children and pets      Current Medications (including OTC/herbals), Comorbidities and Allergies     Current Outpatient Medications   Medication Sig Dispense Refill    amLODIPine (NORVASC) 2.5 MG tablet TAKE 2 TABLETS DAILY 180 tablet 3    aspirin (ECOTRIN) 81 MG tablet Take 81 mg by mouth daily.      atorvastatin (LIPITOR) 20 MG tablet Take 1 tablet (20 mg total) by mouth nightly. 30 tablet 0    BD INSULIN SYRINGE ULT-FINE II 1/2 mL 31 x 5/16 Syrg       brinzolamide-brimonidine (SIMBRINZA) 1-0.2 % DrpS Apply 1 drop to eye two (2) times a day. Right eye      cinacalcet (SENSIPAR) 60 MG tablet Take 1 tablet (60 mg total) by mouth daily. 90 tablet 3    ferrous sulfate 325 (65 FE) MG tablet Take 325 mg by mouth daily with breakfast.       furosemide (LASIX) 20 MG tablet Take 1 tablet (20 mg total) by mouth every other day. 45 tablet 3    lisinopril (PRINIVIL,ZESTRIL) 40 MG tablet TAKE 1 TABLET DAILY 90 tablet 3    mycophenolate (MYFORTIC) 360 MG TbEC TAKE 1 TABLET TWICE A DAY 180 tablet 3    netarsudiL (RHOPRESSA) 0.02 % Drop Apply 1 drop to eye nightly. Right eye      omeprazole (PRILOSEC) 20 MG capsule Take 1 capsule (20 mg total) by mouth daily as needed. 90 capsule 3    ONE TOUCH DELICA LANCETS 30 gauge Misc       ONE TOUCH ULTRA TEST Strp       tacrolimus (PROGRAF) 1 MG capsule Take 3 capsules (3 mg total) by mouth two (2) times a day. 180 capsule 0    triamcinolone (KENALOG) 0.1 % cream       VITAMIN D3 2,000 unit cap TAKE 1 CAPSULE DAILY AT 0600 90 capsule 4     No current facility-administered medications for this visit.       No Known Allergies    Patient Active Problem List   Diagnosis    Personal history of venous thrombosis and embolism    Kidney transplant 09/01/2011    Hypertension    Type 1 diabetes mellitus (CMS-HCC)    Hyperlipidemia    Basal cell carcinoma of back    Kidney transplant recipient    Immunocompromised (CMS-HCC)       Reviewed and up to date in Epic.    Appropriateness of Therapy     Acute infections noted within Epic:  No active infections  Patient reported infection: None    Is medication and dose appropriate based on diagnosis and infection status? Yes    Prescription has been clinically reviewed: Yes      Baseline Quality of Life Assessment      How many days over the  past month did your kidney transplant  keep you from your normal activities? For example, brushing your teeth or getting up in the morning. 0    Financial Information     Medication Assistance provided: TEMP PAP    Anticipated copay of $0 reviewed with patient. Verified delivery address.    Delivery Information     Scheduled delivery date: 01/21/23    Expected start date: 01/21/23    Medication will be delivered via Same Day Courier to the prescription address in [facility].  This shipment will not require a signature.      Explained the services we provide at [facility] and that each month we would call to set up refills.  Stressed importance of returning phone calls so that we could ensure they receive their medications in time each month.  Informed patient that we should be setting up refills 7-10 days prior to when they will run out of medication.  A pharmacist will reach out to perform a clinical assessment periodically.  Informed patient that a welcome packet, containing information about our pharmacy and other support services, a Notice of Privacy Practices, and a drug information handout will be sent.      The patient or caregiver noted above participated in the development of this care plan and knows that they can request review of or adjustments to the care plan at any time.      Patient or caregiver verbalized understanding of the above information as well as how to contact the pharmacy at [phone] option 4 with any questions/concerns.  The pharmacy is open Monday through Friday 8:30am-4:30pm.  A pharmacist is available 24/7 via pager to answer any clinical questions they may have.    Patient Specific Needs     Does the patient have any physical, cognitive, or cultural barriers? No    Does the patient have adequate living arrangements? (i.e. the ability to store and take their medication appropriately) Yes    Did you identify any home environmental safety or security hazards? No    Patient prefers to have medications discussed with  Patient     Is the patient or caregiver able to read and understand education materials at a high school level or above? Yes    Patient's primary language is  English     Is the patient high risk? Yes, patient is taking a REMS drug. Medication is dispensed in compliance with REMS program    SOCIAL DETERMINANTS OF HEALTH     At the [facility], we have learned that life circumstances - like trouble affording food, housing, utilities, or transportation can affect the health of many of our patients.   That is why we wanted to ask: are you currently experiencing any life circumstances that are negatively impacting your health and/or quality of life? No    Social Determinants of Psychologist, prison and probation services Strain: Not on file   Internet Connectivity: Not on file   Food Insecurity: Not on file   Tobacco Use: Low Risk  (10/07/2022)    Patient History     Smoking Tobacco Use: Never     Smokeless Tobacco Use: Never     Passive Exposure: Never   Housing/Utilities: Not on file   Alcohol Use: Not on file   Transportation Needs: Not on file   Substance Use: Not on file   Health Literacy: Not on file   Physical Activity: Not on file   Interpersonal  Safety: Not on file   Stress: Not on file   Intimate Partner Violence: Not on file   Depression: Not on file   Social Connections: Not on file       Would you be willing to receive help with any of the needs that you have identified today? Not applicable       Tera Helper, [facility]  [facility] Shared [facility] Specialty Pharmacist

## 2023-01-20 NOTE — Unmapped (Signed)
[facility] Specialty Medication Onboarding    Specialty Medication: Tacrolimus 1mg  capsule  Prior Authorization: Not Required   Financial Assistance: No - copay  <$25  Final Copay/Day Supply: $0 / 30 days    Insurance Restrictions: Yes - max 1 month supply     Notes to Pharmacist: PAP Temp on file. Pt recently retired and is waiting for new insurance card. Needs 30 day short supply of this med please.    Atorvastatin - $0 for 30ds    The triage team has completed the benefits investigation and has determined that the patient is able to fill this medication at [facility]. Please contact the patient to complete the onboarding or follow up with the prescribing physician as needed.

## 2023-02-09 DIAGNOSIS — Z940 Kidney transplant status: Principal | ICD-10-CM

## 2023-02-09 MED ORDER — CINACALCET 60 MG TABLET
ORAL_TABLET | Freq: Every day | ORAL | 3 refills | 90 days | Status: CP
Start: 2023-02-09 — End: 2024-02-09

## 2023-02-09 NOTE — Unmapped (Signed)
Pt called asking for 90 day supply of his [facility]ipar [facility], he said he is not out but will be this week. He did not leave his TNC a message yet. I let him know I will relay message to her. He said [facility] now.

## 2023-02-10 NOTE — Unmapped (Signed)
Patient has retired and has Manufacturing engineer.   He is in the process of getting scripts filled at his usual pharmacy but is unsure if they can get him meds to him before he runs out.  He is calling the insurance to check on delivery and will call us back if he needs anything.      [facility] Shared [facility] Clinical Assessment & Refill Coordination Note    Steven Cooley, DOB: May 23, 1956  Phone: [phone] (home)     All above HIPAA information was verified with patient.     Was a Nurse, learning disability used for this call? No    Specialty Medication(s):   Transplant: tacrolimus 1mg      Current Outpatient Medications   Medication Sig Dispense Refill    amLODIPine (NORVASC) 2.5 MG tablet TAKE 2 TABLETS DAILY 180 tablet 3    aspirin (ECOTRIN) 81 MG tablet Take 81 mg by mouth daily.      atorvastatin (LIPITOR) 20 MG tablet Take 1 tablet (20 mg total) by mouth nightly. 30 tablet 0    BD INSULIN SYRINGE ULT-FINE II 1/2 mL 31 x 5/16 Syrg       brinzolamide-brimonidine (SIMBRINZA) 1-0.2 % DrpS Apply 1 drop to eye two (2) times a day. Right eye      cinacalcet (SENSIPAR) 60 MG tablet Take 1 tablet (60 mg total) by mouth daily. 90 tablet 3    ferrous sulfate 325 (65 FE) MG tablet Take 325 mg by mouth daily with breakfast.       furosemide (LASIX) 20 MG tablet Take 1 tablet (20 mg total) by mouth every other day. 45 tablet 3    lisinopril (PRINIVIL,ZESTRIL) 40 MG tablet TAKE 1 TABLET DAILY 90 tablet 3    mycophenolate (MYFORTIC) 360 MG TbEC TAKE 1 TABLET TWICE A DAY 180 tablet 3    netarsudiL (RHOPRESSA) 0.02 % Drop Apply 1 drop to eye nightly. Right eye      omeprazole (PRILOSEC) 20 MG capsule Take 1 capsule (20 mg total) by mouth daily as needed. 90 capsule 3    ONE TOUCH DELICA LANCETS 30 gauge Misc       ONE TOUCH ULTRA TEST Strp       tacrolimus (PROGRAF) 1 MG capsule Take 3 capsules (3 mg total) by mouth two (2) times a day. 180 capsule 0    triamcinolone (KENALOG) 0.1 % cream       VITAMIN D3 2,000 unit cap TAKE 1 CAPSULE DAILY AT 0600 90 capsule 4     No current facility-administered medications for this visit.        Changes to medications: Jakarion reports no changes at this time.    No Known Allergies    Changes to allergies: No    SPECIALTY MEDICATION ADHERENCE     Tacrolimus 1 mg: 10 days of medicine on hand   Medication Adherence    Patient reported X missed doses in the last month: 0  Specialty Medication: Tacrolimus 1mg   Patient is on additional specialty medications: No          Specialty medication(s) dose(s) confirmed: Regimen is correct and unchanged.     Are there any concerns with adherence? No    Adherence counseling provided? Not needed    CLINICAL MANAGEMENT AND INTERVENTION      Clinical Benefit Assessment:    Do you feel the medicine is effective or helping your condition? Yes    Clinical Benefit counseling provided? Not needed  Adverse Effects Assessment:    Are you experiencing any side effects? No    Are you experiencing difficulty administering your medicine? No    Quality of Life Assessment:    Quality of Life    Rheumatology  Oncology  Dermatology  Cystic Fibrosis          How many days over the past month did your kidney transplant  keep you from your normal activities? For example, brushing your teeth or getting up in the morning. 0    Have you discussed this with your provider? Not needed    Acute Infection Status:    Acute infections noted within Epic:  No active infections  Patient reported infection: None    Therapy Appropriateness:    Is therapy appropriate and patient progressing towards therapeutic goals? Yes, therapy is appropriate and should be continued    DISEASE/MEDICATION-SPECIFIC INFORMATION      N/A    Solid Organ Transplant: Not Applicable    PATIENT SPECIFIC NEEDS     Does the patient have any physical, cognitive, or cultural barriers? No    Is the patient high risk? Yes, patient is taking a REMS drug. Medication is dispensed in compliance with REMS program    Did the patient require a clinical intervention? No    Does the patient require physician intervention or other additional services (i.e., nutrition, smoking cessation, social work)? No    SOCIAL DETERMINANTS OF HEALTH     At the [facility], we have learned that life circumstances - like trouble affording food, housing, utilities, or transportation can affect the health of many of our patients.   That is why we wanted to ask: are you currently experiencing any life circumstances that are negatively impacting your health and/or quality of life? No    Social Determinants of Psychologist, prison and probation services Strain: Not on file   Internet Connectivity: Not on file   Food Insecurity: Not on file   Tobacco Use: Low Risk  (11/25/2022)    Received from [facility]    Patient History     Smoking Tobacco Use: Never     Smokeless Tobacco Use: Never     Passive Exposure: Not on file   Housing/Utilities: Not on file   Alcohol Use: Not on file   Transportation Needs: Not on file   Substance Use: Not on file   Health Literacy: Not on file   Physical Activity: Not on file   Interpersonal Safety: Not on file   Stress: Not on file   Intimate Partner Violence: Not on file   Depression: Not on file   Social Connections: Not on file       Would you be willing to receive help with any of the needs that you have identified today? Not applicable       SHIPPING     Specialty Medication(s) to be Shipped:   Transplant: None- patient is filling at another pharmacy.    Other medication(s) to be shipped: No additional medications requested for fill at this time     Changes to insurance: No    Patient was informed of new phone menu: Yes    Delivery Scheduled: Patient declined refill at this time due to filling at a different pharmacy..     Medication will be delivered via UPS to the confirmed prescription address in [facility].    The patient will receive a drug information handout for each medication shipped and additional FDA  Medication Guides as required. Verified that patient has previously received a Conservation officer, historic buildings and a Surveyor, mining.    The patient or caregiver noted above participated in the development of this care plan and knows that they can request review of or adjustments to the care plan at any time.      All of the patient's questions and concerns have been addressed.    Tera Helper, [facility]   [facility] Shared [facility] Specialty Pharmacist

## 2023-02-12 DIAGNOSIS — H401121 Primary open-angle glaucoma, left eye, mild stage: Secondary | ICD-10-CM | POA: Diagnosis not present

## 2023-02-16 DIAGNOSIS — Z940 Kidney transplant status: Principal | ICD-10-CM

## 2023-02-16 NOTE — Unmapped (Signed)
Pt did not receive Lisinopril from [facility]. Called to inquire and there was an ineligible flag on the script. Talked with pharmacist at [facility] and cleared up confusion, new 90 day shipment will go out today. Called pt to let him know and he also requested 90 day supply of Cinacalcet which has now been sent.

## 2023-02-19 DIAGNOSIS — H401133 Primary open-angle glaucoma, bilateral, severe stage: Secondary | ICD-10-CM | POA: Diagnosis not present

## 2023-02-20 DIAGNOSIS — Z79899 Other long term (current) drug therapy: Principal | ICD-10-CM

## 2023-02-20 DIAGNOSIS — Z940 Kidney transplant status: Principal | ICD-10-CM

## 2023-02-20 DIAGNOSIS — Z1159 Encounter for screening for other viral diseases: Principal | ICD-10-CM

## 2023-02-23 DIAGNOSIS — L570 Actinic keratosis: Secondary | ICD-10-CM | POA: Diagnosis not present

## 2023-02-23 DIAGNOSIS — L578 Other skin changes due to chronic exposure to nonionizing radiation: Secondary | ICD-10-CM | POA: Diagnosis not present

## 2023-02-23 DIAGNOSIS — D485 Neoplasm of uncertain behavior of skin: Secondary | ICD-10-CM | POA: Diagnosis not present

## 2023-02-23 DIAGNOSIS — L738 Other specified follicular disorders: Secondary | ICD-10-CM | POA: Diagnosis not present

## 2023-02-23 DIAGNOSIS — Z86018 Personal history of other benign neoplasm: Secondary | ICD-10-CM | POA: Diagnosis not present

## 2023-03-16 ENCOUNTER — Other Ambulatory Visit
Admission: RE | Admit: 2023-03-16 | Discharge: 2023-03-16 | Disposition: A | Discharge status: Home / Self Care | Payer: BC Managed Care – PPO | Attending: Nephrology | Admitting: Nephrology

## 2023-03-16 DIAGNOSIS — N390 Urinary tract infection, site not specified: Secondary | ICD-10-CM | POA: Insufficient documentation

## 2023-03-16 DIAGNOSIS — E559 Vitamin D deficiency, unspecified: Secondary | ICD-10-CM | POA: Insufficient documentation

## 2023-03-16 DIAGNOSIS — Z114 Encounter for screening for human immunodeficiency virus [HIV]: Secondary | ICD-10-CM | POA: Diagnosis not present

## 2023-03-16 DIAGNOSIS — Z789 Other specified health status: Secondary | ICD-10-CM | POA: Diagnosis not present

## 2023-03-16 DIAGNOSIS — D899 Disorder involving the immune mechanism, unspecified: Secondary | ICD-10-CM | POA: Diagnosis not present

## 2023-03-16 DIAGNOSIS — D631 Anemia in chronic kidney disease: Secondary | ICD-10-CM | POA: Insufficient documentation

## 2023-03-16 DIAGNOSIS — B259 Cytomegaloviral disease, unspecified: Secondary | ICD-10-CM | POA: Insufficient documentation

## 2023-03-16 DIAGNOSIS — E1129 Type 2 diabetes mellitus with other diabetic kidney complication: Secondary | ICD-10-CM | POA: Insufficient documentation

## 2023-03-16 DIAGNOSIS — Z9483 Pancreas transplant status: Secondary | ICD-10-CM | POA: Insufficient documentation

## 2023-03-16 DIAGNOSIS — Z79899 Other long term (current) drug therapy: Secondary | ICD-10-CM | POA: Diagnosis not present

## 2023-03-16 DIAGNOSIS — Z940 Kidney transplant status: Secondary | ICD-10-CM | POA: Insufficient documentation

## 2023-03-16 DIAGNOSIS — Z09 Encounter for follow-up examination after completed treatment for conditions other than malignant neoplasm: Secondary | ICD-10-CM | POA: Diagnosis not present

## 2023-03-16 LAB — BASIC METABOLIC PANEL
ANION GAP: 5 (ref 5–15)
Anion gap: 5 (ref 5–15)
BLOOD UREA NITROGEN: 33 mg/dL — ABNORMAL HIGH (ref 8–23)
BUN: 33 mg/dL — ABNORMAL HIGH (ref 8–23)
CALCIUM: 8.8 mg/dL — ABNORMAL LOW (ref 8.9–10.3)
CHLORIDE: 109 (ref 98–111)
CO2: 23 mmol/L (ref 22–32)
CO2: 23 mmol/L (ref 22–32)
CREATININE: 1.31 mg/dL — ABNORMAL HIGH (ref 0.61–1.24)
Calcium: 8.8 mg/dL — ABNORMAL LOW (ref 8.9–10.3)
Chloride: 109 mmol/L (ref 98–111)
Creatinine, Ser: 1.31 mg/dL — ABNORMAL HIGH (ref 0.61–1.24)
EGFR CKD-EPI NON-AA MALE: >60 mL/min
GFR, Estimated: >60 mL/min (ref >60)
GLUCOSE RANDOM: 119 mg/dL — ABNORMAL HIGH (ref 70–99)
Glucose, Bld: 119 mg/dL — ABNORMAL HIGH (ref 70–99)
MAGNESIUM: 1.6 mg/dL — ABNORMAL LOW (ref 1.7–2.4)
PHOSPHORUS: 3 mg/dL (ref 2.5–4.6)
POTASSIUM: 4.4 mmol/L (ref 3.5–5.1)
Potassium: 4.4 mmol/L (ref 3.5–5.1)
SODIUM: 137 mmol/L (ref 135–145)
Sodium: 137 mmol/L (ref 135–145)

## 2023-03-16 LAB — CBC WITH DIFFERENTIAL/PLATELET
Abs Immature Granulocytes: 0.04 10*3/uL (ref 0.00–0.07)
Basophils Absolute: 0.1 10*3/uL (ref 0.0–0.1)
Basophils Relative: 2 %
Eosinophils Absolute: 0.7 10*3/uL — ABNORMAL HIGH (ref 0.0–0.5)
Eosinophils Relative: 14 %
HCT: 30.9 % — ABNORMAL LOW (ref 39.0–52.0)
Hemoglobin: 9.6 g/dL — ABNORMAL LOW (ref 13.0–17.0)
Immature Granulocytes: 1 %
Lymphocytes Relative: 31 %
Lymphs Abs: 1.6 10*3/uL (ref 0.7–4.0)
MCH: 31.1 pg (ref 26.0–34.0)
MCHC: 31.1 g/dL (ref 30.0–36.0)
MCV: 100 fL (ref 80.0–100.0)
Monocytes Absolute: 0.4 10*3/uL (ref 0.1–1.0)
Monocytes Relative: 8 %
Neutro Abs: 2.4 10*3/uL (ref 1.7–7.7)
Neutrophils Relative %: 44 %
Platelets: 156 10*3/uL (ref 150–400)
RBC: 3.09 MIL/uL — ABNORMAL LOW (ref 4.22–5.81)
RDW: 13.6 % (ref 11.5–15.5)
WBC: 5.3 10*3/uL (ref 4.0–10.5)
nRBC: 0 % (ref 0.0–0.2)

## 2023-03-16 LAB — MAGNESIUM: Magnesium: 1.6 mg/dL — ABNORMAL LOW (ref 1.7–2.4)

## 2023-03-16 LAB — PHOSPHORUS: Phosphorus: 3 mg/dL (ref 2.5–4.6)

## 2023-03-16 LAB — CBC W/ DIFFERENTIAL
BASOPHILS ABSOLUTE COUNT: 0.1 10*3/uL (ref 0.0–0.1)
BASOPHILS RELATIVE PERCENT: 2 %
EOSINOPHILS ABSOLUTE COUNT: 0.7 10*3/uL — ABNORMAL HIGH (ref 0.0–0.5)
EOSINOPHILS RELATIVE PERCENT: 14 %
HEMATOCRIT: 30.9 — ABNORMAL LOW (ref 39.0–52.0)
HEMOGLOBIN: 9.6 — ABNORMAL LOW (ref 13.0–17.0)
IMMATURE CELLS: 1
LYMPHOCYTES ABSOLUTE COUNT: 1.6 10*3/uL (ref 0.7–4.0)
LYMPHOCYTES RELATIVE PERCENT: 31 %
MEAN CORPUSCULAR HEMOGLOBIN CONC: 31.1 g/dL (ref 30.0–36.0)
MEAN CORPUSCULAR HEMOGLOBIN: 31.1
MEAN CORPUSCULAR VOLUME: 100 (ref 80.0–100.0)
MONOCYTES ABSOLUTE COUNT: 0.4
MONOCYTES RELATIVE PERCENT: 8 %
NEUTROPHILS RELATIVE PERCENT: 44 %
NUCLEATED RED BLOOD CELLS: 0 (ref 0.0–0.2)
PLATELET COUNT: 156 10*3/uL (ref 150–400)
RED BLOOD CELL COUNT: 3.09 MIL/uL — ABNORMAL LOW (ref 4.22–5.81)
RED CELL DISTRIBUTION WIDTH: 13.6 % (ref 11.5–15.5)
WHITE BLOOD CELL COUNT: 5.3 10*3/uL (ref 4.0–10.5)

## 2023-03-17 DIAGNOSIS — Z940 Kidney transplant status: Principal | ICD-10-CM

## 2023-03-17 DIAGNOSIS — N189 Chronic kidney disease, unspecified: Principal | ICD-10-CM

## 2023-03-17 DIAGNOSIS — Z1159 Encounter for screening for other viral diseases: Principal | ICD-10-CM

## 2023-03-17 DIAGNOSIS — Z79899 Other long term (current) drug therapy: Principal | ICD-10-CM

## 2023-03-17 DIAGNOSIS — D631 Anemia in chronic kidney disease: Principal | ICD-10-CM

## 2023-03-17 DIAGNOSIS — C44329 Squamous cell carcinoma of skin of other parts of face: Secondary | ICD-10-CM | POA: Diagnosis not present

## 2023-03-18 LAB — TACROLIMUS LEVEL, TROUGH: TACROLIMUS, TROUGH: 12.3 ng/mL (ref 5.0–15.0)

## 2023-03-18 NOTE — Unmapped (Signed)
Pt's Tac 12.3. Called and he confirmed it is a good trough, no hand tremors, no headaches or GI upset. He will repeat labs when he comes to clinic for his next appt next Friday. He will ensure a good trough as this is very much an outlier from his usual levels. (This is the second time a spike like this has happened in the last few months.).

## 2023-03-19 MED ORDER — AMLODIPINE 2.5 MG TABLET
ORAL_TABLET | 3 refills | 0 days | Status: CP
Start: 2023-03-19 — End: ?

## 2023-03-19 NOTE — Unmapped (Signed)
The patient is requesting a medication refill

## 2023-03-24 NOTE — Unmapped (Signed)
Specialty Medication(s): tacrolimus    [name] has been dis-enrolled from the [facility] specialty pharmacy services due to a pharmacy change. [facility]    Additional information provided to the patient: n/a    Tera Helper, [facility]  [facility] Shared [facility] Specialty Pharmacist

## 2023-04-02 ENCOUNTER — Ambulatory Visit
Admit: 2023-04-02 | Discharge: 2023-04-03 | Payer: PRIVATE HEALTH INSURANCE | Attending: Nephrology | Primary: Nephrology

## 2023-04-02 ENCOUNTER — Ambulatory Visit: Admit: 2023-04-02 | Discharge: 2023-04-03 | Payer: PRIVATE HEALTH INSURANCE

## 2023-04-02 DIAGNOSIS — Z1159 Encounter for screening for other viral diseases: Principal | ICD-10-CM

## 2023-04-02 DIAGNOSIS — Z79899 Other long term (current) drug therapy: Principal | ICD-10-CM

## 2023-04-02 DIAGNOSIS — Z940 Kidney transplant status: Principal | ICD-10-CM

## 2023-04-02 DIAGNOSIS — D849 Immunodeficiency, unspecified: Secondary | ICD-10-CM | POA: Diagnosis not present

## 2023-04-02 MED ORDER — CINACALCET 60 MG TABLET
ORAL_TABLET | Freq: Every day | ORAL | 3 refills | 90 days | Status: CP
Start: 2023-04-02 — End: 2024-04-01

## 2023-04-02 MED ORDER — TACROLIMUS 1 MG CAPSULE, IMMEDIATE-RELEASE
ORAL_CAPSULE | Freq: Two times a day (BID) | ORAL | 0 refills | 30 days | Status: CP
Start: 2023-04-02 — End: 2023-05-02

## 2023-04-14 ENCOUNTER — Other Ambulatory Visit
Admission: RE | Admit: 2023-04-14 | Discharge: 2023-04-14 | Disposition: A | Discharge status: Home / Self Care | Payer: BC Managed Care – PPO | Attending: Nephrology | Admitting: Nephrology

## 2023-04-14 DIAGNOSIS — D899 Disorder involving the immune mechanism, unspecified: Secondary | ICD-10-CM | POA: Insufficient documentation

## 2023-04-14 DIAGNOSIS — Z114 Encounter for screening for human immunodeficiency virus [HIV]: Secondary | ICD-10-CM | POA: Insufficient documentation

## 2023-04-14 DIAGNOSIS — Z79899 Other long term (current) drug therapy: Secondary | ICD-10-CM | POA: Diagnosis not present

## 2023-04-14 DIAGNOSIS — D631 Anemia in chronic kidney disease: Secondary | ICD-10-CM | POA: Insufficient documentation

## 2023-04-14 DIAGNOSIS — N390 Urinary tract infection, site not specified: Secondary | ICD-10-CM | POA: Diagnosis not present

## 2023-04-14 DIAGNOSIS — Z09 Encounter for follow-up examination after completed treatment for conditions other than malignant neoplasm: Secondary | ICD-10-CM | POA: Diagnosis not present

## 2023-04-14 DIAGNOSIS — B259 Cytomegaloviral disease, unspecified: Secondary | ICD-10-CM | POA: Diagnosis not present

## 2023-04-14 DIAGNOSIS — E1129 Type 2 diabetes mellitus with other diabetic kidney complication: Secondary | ICD-10-CM | POA: Insufficient documentation

## 2023-04-14 DIAGNOSIS — B9689 Other specified bacterial agents as the cause of diseases classified elsewhere: Secondary | ICD-10-CM | POA: Diagnosis not present

## 2023-04-14 DIAGNOSIS — Z789 Other specified health status: Secondary | ICD-10-CM | POA: Insufficient documentation

## 2023-04-14 DIAGNOSIS — Z940 Kidney transplant status: Secondary | ICD-10-CM | POA: Diagnosis not present

## 2023-04-14 DIAGNOSIS — E559 Vitamin D deficiency, unspecified: Secondary | ICD-10-CM | POA: Diagnosis not present

## 2023-04-14 LAB — CBC WITH DIFFERENTIAL/PLATELET
Abs Immature Granulocytes: 0.03 10*3/uL (ref 0.00–0.07)
Basophils Absolute: 0.1 10*3/uL (ref 0.0–0.1)
Basophils Relative: 2 %
Eosinophils Absolute: 0.9 10*3/uL — ABNORMAL HIGH (ref 0.0–0.5)
Eosinophils Relative: 16 %
HCT: 29.9 % — ABNORMAL LOW (ref 39.0–52.0)
Hemoglobin: 9.4 g/dL — ABNORMAL LOW (ref 13.0–17.0)
Immature Granulocytes: 1 %
Lymphocytes Relative: 35 %
Lymphs Abs: 2.1 10*3/uL (ref 0.7–4.0)
MCH: 31.3 pg (ref 26.0–34.0)
MCHC: 31.4 g/dL (ref 30.0–36.0)
MCV: 99.7 fL (ref 80.0–100.0)
Monocytes Absolute: 0.6 10*3/uL (ref 0.1–1.0)
Monocytes Relative: 10 %
Neutro Abs: 2.2 10*3/uL (ref 1.7–7.7)
Neutrophils Relative %: 36 %
Platelets: 157 10*3/uL (ref 150–400)
RBC: 3 MIL/uL — ABNORMAL LOW (ref 4.22–5.81)
RDW: 13.1 % (ref 11.5–15.5)
WBC: 5.9 10*3/uL (ref 4.0–10.5)
nRBC: 0 % (ref 0.0–0.2)

## 2023-04-14 LAB — PHOSPHORUS: Phosphorus: 4 mg/dL (ref 2.5–4.6)

## 2023-04-14 LAB — BASIC METABOLIC PANEL
Anion gap: 5 (ref 5–15)
BUN: 61 mg/dL — ABNORMAL HIGH (ref 8–23)
CO2: 20 mmol/L — ABNORMAL LOW (ref 22–32)
Calcium: 9.2 mg/dL (ref 8.9–10.3)
Chloride: 111 mmol/L (ref 98–111)
Creatinine, Ser: 1.91 mg/dL — ABNORMAL HIGH (ref 0.61–1.24)
GFR, Estimated: 39 mL/min — ABNORMAL LOW (ref >60)
Glucose, Bld: 92 mg/dL (ref 70–99)
Potassium: 4.5 mmol/L (ref 3.5–5.1)
Sodium: 136 mmol/L (ref 135–145)

## 2023-04-14 LAB — MAGNESIUM: Magnesium: 1.9 mg/dL (ref 1.7–2.4)

## 2023-04-15 DIAGNOSIS — Z940 Kidney transplant status: Principal | ICD-10-CM

## 2023-04-15 MED ORDER — ATORVASTATIN 20 MG TABLET
ORAL_TABLET | Freq: Every evening | ORAL | 3 refills | 90 days | Status: CP
Start: 2023-04-15 — End: ?

## 2023-04-19 DIAGNOSIS — C44329 Squamous cell carcinoma of skin of other parts of face: Secondary | ICD-10-CM | POA: Diagnosis not present

## 2023-04-23 ENCOUNTER — Other Ambulatory Visit
Admission: RE | Admit: 2023-04-23 | Discharge: 2023-04-23 | Disposition: A | Discharge status: Home / Self Care | Payer: BC Managed Care – PPO | Attending: Nephrology | Admitting: Nephrology

## 2023-04-23 DIAGNOSIS — Z940 Kidney transplant status: Secondary | ICD-10-CM | POA: Diagnosis not present

## 2023-04-23 DIAGNOSIS — E559 Vitamin D deficiency, unspecified: Secondary | ICD-10-CM | POA: Diagnosis not present

## 2023-04-23 DIAGNOSIS — D631 Anemia in chronic kidney disease: Secondary | ICD-10-CM | POA: Insufficient documentation

## 2023-04-23 DIAGNOSIS — Z79899 Other long term (current) drug therapy: Secondary | ICD-10-CM | POA: Insufficient documentation

## 2023-04-23 DIAGNOSIS — N390 Urinary tract infection, site not specified: Secondary | ICD-10-CM | POA: Diagnosis not present

## 2023-04-23 DIAGNOSIS — E1129 Type 2 diabetes mellitus with other diabetic kidney complication: Secondary | ICD-10-CM | POA: Diagnosis not present

## 2023-04-23 LAB — CBC WITH DIFFERENTIAL/PLATELET
Abs Immature Granulocytes: 0.02 10*3/uL (ref 0.00–0.07)
Basophils Absolute: 0.1 10*3/uL (ref 0.0–0.1)
Basophils Relative: 2 %
Eosinophils Absolute: 1 10*3/uL — ABNORMAL HIGH (ref 0.0–0.5)
Eosinophils Relative: 17 %
HCT: 31.3 % — ABNORMAL LOW (ref 39.0–52.0)
Hemoglobin: 9.7 g/dL — ABNORMAL LOW (ref 13.0–17.0)
Immature Granulocytes: 0 %
Lymphocytes Relative: 29 %
Lymphs Abs: 1.7 10*3/uL (ref 0.7–4.0)
MCH: 30.9 pg (ref 26.0–34.0)
MCHC: 31 g/dL (ref 30.0–36.0)
MCV: 99.7 fL (ref 80.0–100.0)
Monocytes Absolute: 0.5 10*3/uL (ref 0.1–1.0)
Monocytes Relative: 9 %
Neutro Abs: 2.5 10*3/uL (ref 1.7–7.7)
Neutrophils Relative %: 43 %
Platelets: 139 10*3/uL — ABNORMAL LOW (ref 150–400)
RBC: 3.14 MIL/uL — ABNORMAL LOW (ref 4.22–5.81)
RDW: 13.2 % (ref 11.5–15.5)
WBC: 5.8 10*3/uL (ref 4.0–10.5)
nRBC: 0 % (ref 0.0–0.2)

## 2023-04-23 LAB — BASIC METABOLIC PANEL
Anion gap: 5 (ref 5–15)
BUN: 48 mg/dL — ABNORMAL HIGH (ref 8–23)
CO2: 23 mmol/L (ref 22–32)
Calcium: 9.3 mg/dL (ref 8.9–10.3)
Chloride: 109 mmol/L (ref 98–111)
Creatinine, Ser: 1.57 mg/dL — ABNORMAL HIGH (ref 0.61–1.24)
GFR, Estimated: 49 mL/min — ABNORMAL LOW (ref >60)
Glucose, Bld: 110 mg/dL — ABNORMAL HIGH (ref 70–99)
Potassium: 5 mmol/L (ref 3.5–5.1)
Sodium: 137 mmol/L (ref 135–145)

## 2023-04-23 LAB — MAGNESIUM: Magnesium: 1.7 mg/dL (ref 1.7–2.4)

## 2023-04-23 LAB — PHOSPHORUS: Phosphorus: 3.5 mg/dL (ref 2.5–4.6)

## 2023-04-30 DIAGNOSIS — Z940 Kidney transplant status: Principal | ICD-10-CM

## 2023-05-04 DIAGNOSIS — L570 Actinic keratosis: Secondary | ICD-10-CM | POA: Diagnosis not present

## 2023-05-04 DIAGNOSIS — L738 Other specified follicular disorders: Secondary | ICD-10-CM | POA: Diagnosis not present

## 2023-05-17 DIAGNOSIS — Z940 Kidney transplant status: Principal | ICD-10-CM

## 2023-05-17 MED ORDER — TACROLIMUS 1 MG CAPSULE, IMMEDIATE-RELEASE
ORAL_CAPSULE | 11 refills | 0 days
Start: 2023-05-17 — End: ?

## 2023-05-18 MED ORDER — TACROLIMUS 1 MG CAPSULE, IMMEDIATE-RELEASE
ORAL_CAPSULE | ORAL | 3 refills | 90 days | Status: CP
Start: 2023-05-18 — End: 2024-05-17

## 2023-06-04 ENCOUNTER — Other Ambulatory Visit
Admission: RE | Admit: 2023-06-04 | Discharge: 2023-06-04 | Disposition: A | Discharge status: Home / Self Care | Payer: BC Managed Care – PPO | Attending: Nephrology | Admitting: Nephrology

## 2023-06-04 DIAGNOSIS — T8610 Unspecified complication of kidney transplant: Secondary | ICD-10-CM | POA: Insufficient documentation

## 2023-06-04 DIAGNOSIS — E1129 Type 2 diabetes mellitus with other diabetic kidney complication: Secondary | ICD-10-CM | POA: Diagnosis not present

## 2023-06-04 DIAGNOSIS — N390 Urinary tract infection, site not specified: Secondary | ICD-10-CM | POA: Insufficient documentation

## 2023-06-04 DIAGNOSIS — Z940 Kidney transplant status: Secondary | ICD-10-CM | POA: Diagnosis not present

## 2023-06-04 DIAGNOSIS — Z09 Encounter for follow-up examination after completed treatment for conditions other than malignant neoplasm: Secondary | ICD-10-CM | POA: Diagnosis not present

## 2023-06-04 DIAGNOSIS — Z79899 Other long term (current) drug therapy: Secondary | ICD-10-CM | POA: Diagnosis not present

## 2023-06-04 DIAGNOSIS — L821 Other seborrheic keratosis: Secondary | ICD-10-CM | POA: Diagnosis not present

## 2023-06-04 DIAGNOSIS — D899 Disorder involving the immune mechanism, unspecified: Secondary | ICD-10-CM | POA: Insufficient documentation

## 2023-06-04 DIAGNOSIS — Z9483 Pancreas transplant status: Secondary | ICD-10-CM | POA: Diagnosis not present

## 2023-06-04 DIAGNOSIS — D631 Anemia in chronic kidney disease: Secondary | ICD-10-CM | POA: Insufficient documentation

## 2023-06-04 DIAGNOSIS — Z114 Encounter for screening for human immunodeficiency virus [HIV]: Secondary | ICD-10-CM | POA: Diagnosis not present

## 2023-06-04 DIAGNOSIS — B259 Cytomegaloviral disease, unspecified: Secondary | ICD-10-CM | POA: Insufficient documentation

## 2023-06-04 DIAGNOSIS — L570 Actinic keratosis: Secondary | ICD-10-CM | POA: Diagnosis not present

## 2023-06-04 DIAGNOSIS — Z789 Other specified health status: Secondary | ICD-10-CM | POA: Insufficient documentation

## 2023-06-04 DIAGNOSIS — E559 Vitamin D deficiency, unspecified: Secondary | ICD-10-CM | POA: Diagnosis not present

## 2023-06-04 LAB — BASIC METABOLIC PANEL
Anion gap: 7 (ref 5–15)
BUN: 41 mg/dL — ABNORMAL HIGH (ref 8–23)
CO2: 23 mmol/L (ref 22–32)
Calcium: 9.1 mg/dL (ref 8.9–10.3)
Chloride: 110 mmol/L (ref 98–111)
Creatinine, Ser: 1.69 mg/dL — ABNORMAL HIGH (ref 0.61–1.24)
GFR, Estimated: 45 mL/min — ABNORMAL LOW (ref >60)
Glucose, Bld: 110 mg/dL — ABNORMAL HIGH (ref 70–99)
Potassium: 4.6 mmol/L (ref 3.5–5.1)
Sodium: 140 mmol/L (ref 135–145)

## 2023-06-04 LAB — CBC WITH DIFFERENTIAL/PLATELET
Abs Immature Granulocytes: 0.02 10*3/uL (ref 0.00–0.07)
Basophils Absolute: 0.1 10*3/uL (ref 0.0–0.1)
Basophils Relative: 2 %
Eosinophils Absolute: 1.3 10*3/uL — ABNORMAL HIGH (ref 0.0–0.5)
Eosinophils Relative: 21 %
HCT: 30.1 % — ABNORMAL LOW (ref 39.0–52.0)
Hemoglobin: 9.3 g/dL — ABNORMAL LOW (ref 13.0–17.0)
Immature Granulocytes: 0 %
Lymphocytes Relative: 30 %
Lymphs Abs: 1.9 10*3/uL (ref 0.7–4.0)
MCH: 30.7 pg (ref 26.0–34.0)
MCHC: 30.9 g/dL (ref 30.0–36.0)
MCV: 99.3 fL (ref 80.0–100.0)
Monocytes Absolute: 0.5 10*3/uL (ref 0.1–1.0)
Monocytes Relative: 8 %
Neutro Abs: 2.5 10*3/uL (ref 1.7–7.7)
Neutrophils Relative %: 39 %
Platelets: 141 10*3/uL — ABNORMAL LOW (ref 150–400)
RBC: 3.03 MIL/uL — ABNORMAL LOW (ref 4.22–5.81)
RDW: 13.4 % (ref 11.5–15.5)
WBC: 6.4 10*3/uL (ref 4.0–10.5)
nRBC: 0 % (ref 0.0–0.2)

## 2023-06-04 LAB — PHOSPHORUS: Phosphorus: 4.3 mg/dL (ref 2.5–4.6)

## 2023-06-04 LAB — MAGNESIUM: Magnesium: 1.8 mg/dL (ref 1.7–2.4)

## 2023-06-18 DIAGNOSIS — Z940 Kidney transplant status: Principal | ICD-10-CM

## 2023-06-18 DIAGNOSIS — D369 Benign neoplasm, unspecified site: Principal | ICD-10-CM

## 2023-06-18 MED ORDER — MYCOPHENOLATE SODIUM 360 MG TABLET,DELAYED RELEASE
ORAL_TABLET | Freq: Two times a day (BID) | ORAL | 3 refills | 90 days | Status: CP
Start: 2023-06-18 — End: 2024-06-17

## 2023-06-24 DIAGNOSIS — Z940 Kidney transplant status: Principal | ICD-10-CM

## 2023-06-24 DIAGNOSIS — D369 Benign neoplasm, unspecified site: Principal | ICD-10-CM

## 2023-06-24 MED ORDER — CINACALCET 60 MG TABLET
ORAL_TABLET | Freq: Every day | ORAL | 3 refills | 90 days | Status: CP
Start: 2023-06-24 — End: 2024-06-23

## 2023-06-24 MED ORDER — MYCOPHENOLATE SODIUM 360 MG TABLET,DELAYED RELEASE
ORAL_TABLET | Freq: Two times a day (BID) | ORAL | 3 refills | 90 days | Status: CP
Start: 2023-06-24 — End: 2024-06-23

## 2023-07-01 DIAGNOSIS — Z940 Kidney transplant status: Principal | ICD-10-CM

## 2023-07-01 MED ORDER — AMLODIPINE 5 MG TABLET
ORAL | 3 refills | 90 days | Status: CP
Start: 2023-07-01 — End: 2024-06-30

## 2023-07-01 MED ORDER — FUROSEMIDE 20 MG TABLET
ORAL_TABLET | ORAL | 3 refills | 90 days | Status: CP
Start: 2023-07-01 — End: 2024-06-30

## 2023-07-01 MED ORDER — ATORVASTATIN 20 MG TABLET
ORAL | 3 refills | 90 days | Status: CP
Start: 2023-07-01 — End: ?

## 2023-07-01 MED ORDER — OMEPRAZOLE 20 MG CAPSULE,DELAYED RELEASE
ORAL_CAPSULE | Freq: Every day | ORAL | 3 refills | 90 days | Status: CP | PRN
Start: 2023-07-01 — End: 2024-06-30

## 2023-07-01 MED ORDER — LISINOPRIL 40 MG TABLET
ORAL_TABLET | Freq: Every day | ORAL | 3 refills | 90 days | Status: CP
Start: 2023-07-01 — End: ?

## 2023-07-06 DIAGNOSIS — Z940 Kidney transplant status: Principal | ICD-10-CM

## 2023-07-06 MED ORDER — CINACALCET 60 MG TABLET
ORAL_TABLET | Freq: Every day | ORAL | 3 refills | 90 days | Status: CP
Start: 2023-07-06 — End: 2024-07-05

## 2023-08-19 MED ORDER — FUROSEMIDE 20 MG TABLET
ORAL_TABLET | 3 refills | 0 days | Status: CP
Start: 2023-08-19 — End: ?

## 2023-08-30 ENCOUNTER — Other Ambulatory Visit
Admission: RE | Admit: 2023-08-30 | Discharge: 2023-08-30 | Disposition: A | Discharge status: Home / Self Care | Payer: Medicare Other | Attending: Nephrology | Admitting: Nephrology

## 2023-08-30 ENCOUNTER — Ambulatory Visit: Admit: 2023-08-30 | Discharge: 2023-08-31 | Payer: PRIVATE HEALTH INSURANCE

## 2023-08-30 DIAGNOSIS — Z9483 Pancreas transplant status: Secondary | ICD-10-CM | POA: Insufficient documentation

## 2023-08-30 DIAGNOSIS — E1129 Type 2 diabetes mellitus with other diabetic kidney complication: Secondary | ICD-10-CM | POA: Diagnosis not present

## 2023-08-30 DIAGNOSIS — D899 Disorder involving the immune mechanism, unspecified: Secondary | ICD-10-CM | POA: Diagnosis present

## 2023-08-30 DIAGNOSIS — B259 Cytomegaloviral disease, unspecified: Secondary | ICD-10-CM | POA: Insufficient documentation

## 2023-08-30 DIAGNOSIS — D631 Anemia in chronic kidney disease: Secondary | ICD-10-CM | POA: Insufficient documentation

## 2023-08-30 DIAGNOSIS — T8610 Unspecified complication of kidney transplant: Secondary | ICD-10-CM | POA: Diagnosis not present

## 2023-08-30 DIAGNOSIS — Z114 Encounter for screening for human immunodeficiency virus [HIV]: Secondary | ICD-10-CM | POA: Insufficient documentation

## 2023-08-30 DIAGNOSIS — Z940 Kidney transplant status: Secondary | ICD-10-CM | POA: Diagnosis not present

## 2023-08-30 DIAGNOSIS — Z79899 Other long term (current) drug therapy: Secondary | ICD-10-CM | POA: Diagnosis not present

## 2023-08-30 DIAGNOSIS — Z09 Encounter for follow-up examination after completed treatment for conditions other than malignant neoplasm: Secondary | ICD-10-CM | POA: Insufficient documentation

## 2023-08-30 DIAGNOSIS — E559 Vitamin D deficiency, unspecified: Secondary | ICD-10-CM | POA: Insufficient documentation

## 2023-08-30 DIAGNOSIS — N390 Urinary tract infection, site not specified: Secondary | ICD-10-CM | POA: Insufficient documentation

## 2023-08-30 DIAGNOSIS — X58XXXA Exposure to other specified factors, initial encounter: Secondary | ICD-10-CM | POA: Diagnosis not present

## 2023-08-30 DIAGNOSIS — Z789 Other specified health status: Secondary | ICD-10-CM | POA: Insufficient documentation

## 2023-08-30 LAB — CBC WITH DIFFERENTIAL/PLATELET
Abs Immature Granulocytes: 0.03 10*3/uL (ref 0.00–0.07)
Basophils Absolute: 0.1 10*3/uL (ref 0.0–0.1)
Basophils Relative: 2 %
Eosinophils Absolute: 1.1 10*3/uL — ABNORMAL HIGH (ref 0.0–0.5)
Eosinophils Relative: 17 %
HCT: 30.2 % — ABNORMAL LOW (ref 39.0–52.0)
Hemoglobin: 9.4 g/dL — ABNORMAL LOW (ref 13.0–17.0)
Immature Granulocytes: 1 %
Lymphocytes Relative: 31 %
Lymphs Abs: 1.9 10*3/uL (ref 0.7–4.0)
MCH: 30.9 pg (ref 26.0–34.0)
MCHC: 31.1 g/dL (ref 30.0–36.0)
MCV: 99.3 fL (ref 80.0–100.0)
Monocytes Absolute: 0.6 10*3/uL (ref 0.1–1.0)
Monocytes Relative: 10 %
Neutro Abs: 2.4 10*3/uL (ref 1.7–7.7)
Neutrophils Relative %: 39 %
Platelets: 158 10*3/uL (ref 150–400)
RBC: 3.04 MIL/uL — ABNORMAL LOW (ref 4.22–5.81)
RDW: 13.3 % (ref 11.5–15.5)
WBC: 6 10*3/uL (ref 4.0–10.5)
nRBC: 0 % (ref 0.0–0.2)

## 2023-08-30 LAB — PHOSPHORUS: Phosphorus: 4.1 mg/dL (ref 2.5–4.6)

## 2023-08-30 LAB — BASIC METABOLIC PANEL
Anion gap: 5 (ref 5–15)
BUN: 41 mg/dL — ABNORMAL HIGH (ref 8–23)
CO2: 24 mmol/L (ref 22–32)
Calcium: 9 mg/dL (ref 8.9–10.3)
Chloride: 110 mmol/L (ref 98–111)
Creatinine, Ser: 1.35 mg/dL — ABNORMAL HIGH (ref 0.61–1.24)
GFR, Estimated: 58 mL/min — ABNORMAL LOW (ref >60)
Glucose, Bld: 98 mg/dL (ref 70–99)
Potassium: 4.5 mmol/L (ref 3.5–5.1)
Sodium: 139 mmol/L (ref 135–145)

## 2023-08-30 LAB — MAGNESIUM: Magnesium: 1.8 mg/dL (ref 1.7–2.4)

## 2023-09-27 DIAGNOSIS — Z1159 Encounter for screening for other viral diseases: Principal | ICD-10-CM

## 2023-09-27 DIAGNOSIS — Z940 Kidney transplant status: Principal | ICD-10-CM

## 2023-09-27 DIAGNOSIS — Z79899 Other long term (current) drug therapy: Principal | ICD-10-CM

## 2023-09-29 ENCOUNTER — Encounter: Admit: 2023-09-29 | Discharge: 2023-09-29 | Payer: MEDICARE | Attending: Nephrology | Primary: Nephrology

## 2023-09-29 ENCOUNTER — Ambulatory Visit: Admit: 2023-09-29 | Discharge: 2023-09-29 | Payer: MEDICARE

## 2023-09-29 ENCOUNTER — Ambulatory Visit: Admit: 2023-09-29 | Discharge: 2023-09-29 | Payer: MEDICARE | Attending: Nephrology | Primary: Nephrology

## 2023-09-29 DIAGNOSIS — N1831 Anemia in stage 3a chronic kidney disease (CMS-HCC): Principal | ICD-10-CM

## 2023-09-29 DIAGNOSIS — D631 Anemia in chronic kidney disease: Principal | ICD-10-CM

## 2023-09-29 DIAGNOSIS — D849 Immunodeficiency, unspecified: Principal | ICD-10-CM

## 2023-09-29 DIAGNOSIS — Z1159 Encounter for screening for other viral diseases: Principal | ICD-10-CM

## 2023-09-29 DIAGNOSIS — Z79899 Other long term (current) drug therapy: Principal | ICD-10-CM

## 2023-09-29 DIAGNOSIS — N2581 Secondary hyperparathyroidism of renal origin: Principal | ICD-10-CM

## 2023-09-29 DIAGNOSIS — Z940 Kidney transplant status: Principal | ICD-10-CM

## 2023-09-29 DIAGNOSIS — N189 Chronic kidney disease, unspecified: Principal | ICD-10-CM

## 2023-10-12 DIAGNOSIS — Z940 Kidney transplant status: Principal | ICD-10-CM

## 2023-10-12 MED ORDER — TACROLIMUS 1 MG CAPSULE, IMMEDIATE-RELEASE
ORAL_CAPSULE | ORAL | 3 refills | 90 days | Status: CP
Start: 2023-10-12 — End: 2024-10-11

## 2023-11-29 ENCOUNTER — Encounter: Admit: 2023-11-29 | Discharge: 2023-11-29 | Payer: MEDICARE | Attending: Anesthesiology | Primary: Anesthesiology

## 2023-11-29 ENCOUNTER — Ambulatory Visit: Admit: 2023-11-29 | Discharge: 2023-11-29 | Payer: MEDICARE

## 2023-12-03 ENCOUNTER — Other Ambulatory Visit
Admission: RE | Admit: 2023-12-03 | Discharge: 2023-12-03 | Disposition: A | Discharge status: Home / Self Care | Payer: Medicare Other | Attending: Nephrology | Admitting: Nephrology

## 2023-12-03 DIAGNOSIS — Z79899 Other long term (current) drug therapy: Secondary | ICD-10-CM | POA: Insufficient documentation

## 2023-12-03 DIAGNOSIS — Z9483 Pancreas transplant status: Secondary | ICD-10-CM | POA: Insufficient documentation

## 2023-12-03 DIAGNOSIS — Z940 Kidney transplant status: Secondary | ICD-10-CM | POA: Diagnosis present

## 2023-12-03 DIAGNOSIS — Z114 Encounter for screening for human immunodeficiency virus [HIV]: Secondary | ICD-10-CM | POA: Diagnosis present

## 2023-12-03 DIAGNOSIS — B259 Cytomegaloviral disease, unspecified: Secondary | ICD-10-CM | POA: Insufficient documentation

## 2023-12-03 DIAGNOSIS — D631 Anemia in chronic kidney disease: Secondary | ICD-10-CM | POA: Insufficient documentation

## 2023-12-03 DIAGNOSIS — Z48288 Encounter for aftercare following multiple organ transplant: Secondary | ICD-10-CM | POA: Diagnosis not present

## 2023-12-03 DIAGNOSIS — Z789 Other specified health status: Secondary | ICD-10-CM | POA: Insufficient documentation

## 2023-12-03 DIAGNOSIS — D899 Disorder involving the immune mechanism, unspecified: Secondary | ICD-10-CM | POA: Diagnosis not present

## 2023-12-03 DIAGNOSIS — E559 Vitamin D deficiency, unspecified: Secondary | ICD-10-CM | POA: Diagnosis not present

## 2023-12-03 DIAGNOSIS — E1129 Type 2 diabetes mellitus with other diabetic kidney complication: Secondary | ICD-10-CM | POA: Insufficient documentation

## 2023-12-03 LAB — CBC WITH DIFFERENTIAL/PLATELET
Abs Immature Granulocytes: 0.03 10*3/uL (ref 0.00–0.07)
Basophils Absolute: 0.1 10*3/uL (ref 0.0–0.1)
Basophils Relative: 1 %
Eosinophils Absolute: 0.9 10*3/uL — ABNORMAL HIGH (ref 0.0–0.5)
Eosinophils Relative: 16 %
HCT: 32.6 % — ABNORMAL LOW (ref 39.0–52.0)
Hemoglobin: 10.1 g/dL — ABNORMAL LOW (ref 13.0–17.0)
Immature Granulocytes: 1 %
Lymphocytes Relative: 21 %
Lymphs Abs: 1.2 10*3/uL (ref 0.7–4.0)
MCH: 30.7 pg (ref 26.0–34.0)
MCHC: 31 g/dL (ref 30.0–36.0)
MCV: 99.1 fL (ref 80.0–100.0)
Monocytes Absolute: 0.5 10*3/uL (ref 0.1–1.0)
Monocytes Relative: 9 %
Neutro Abs: 2.9 10*3/uL (ref 1.7–7.7)
Neutrophils Relative %: 52 %
Platelets: 149 10*3/uL — ABNORMAL LOW (ref 150–400)
RBC: 3.29 MIL/uL — ABNORMAL LOW (ref 4.22–5.81)
RDW: 13.1 % (ref 11.5–15.5)
WBC: 5.6 10*3/uL (ref 4.0–10.5)
nRBC: 0 % (ref 0.0–0.2)

## 2023-12-03 LAB — PHOSPHORUS: Phosphorus: 2.8 mg/dL (ref 2.5–4.6)

## 2023-12-03 LAB — BASIC METABOLIC PANEL
Anion gap: 7 (ref 5–15)
BUN: 29 mg/dL — ABNORMAL HIGH (ref 8–23)
CO2: 24 mmol/L (ref 22–32)
Calcium: 9 mg/dL (ref 8.9–10.3)
Chloride: 107 mmol/L (ref 98–111)
Creatinine, Ser: 1.16 mg/dL (ref 0.61–1.24)
GFR, Estimated: >60 mL/min (ref >60)
Glucose, Bld: 160 mg/dL — ABNORMAL HIGH (ref 70–99)
Potassium: 4.5 mmol/L (ref 3.5–5.1)
Sodium: 138 mmol/L (ref 135–145)

## 2023-12-03 LAB — MAGNESIUM: Magnesium: 1.6 mg/dL — ABNORMAL LOW (ref 1.7–2.4)

## 2023-12-30 DIAGNOSIS — Z940 Kidney transplant status: Principal | ICD-10-CM

## 2023-12-30 MED ORDER — LISINOPRIL 40 MG TABLET
ORAL_TABLET | Freq: Every day | ORAL | 3 refills | 90.00 days | Status: CP
Start: 2023-12-30 — End: ?

## 2023-12-30 MED ORDER — AMLODIPINE 5 MG TABLET
ORAL_TABLET | Freq: Every day | ORAL | 3 refills | 90 days | Status: CP
Start: 2023-12-30 — End: 2024-12-29

## 2023-12-30 MED ORDER — OMEPRAZOLE 20 MG CAPSULE,DELAYED RELEASE
ORAL_CAPSULE | Freq: Every day | ORAL | 3 refills | 90.00 days | Status: CP | PRN
Start: 2023-12-30 — End: 2024-12-29

## 2023-12-30 MED ORDER — ATORVASTATIN 20 MG TABLET
ORAL_TABLET | Freq: Every evening | ORAL | 3 refills | 90 days | Status: CP
Start: 2023-12-30 — End: ?

## 2024-02-07 DIAGNOSIS — Z1159 Encounter for screening for other viral diseases: Principal | ICD-10-CM

## 2024-02-07 DIAGNOSIS — Z79899 Other long term (current) drug therapy: Principal | ICD-10-CM

## 2024-02-07 DIAGNOSIS — Z940 Kidney transplant status: Principal | ICD-10-CM

## 2024-02-10 ENCOUNTER — Other Ambulatory Visit
Admission: RE | Admit: 2024-02-10 | Discharge: 2024-02-10 | Disposition: A | Discharge status: Home / Self Care | Payer: Medicare Other | Attending: Nephrology | Admitting: Nephrology

## 2024-02-10 DIAGNOSIS — E1129 Type 2 diabetes mellitus with other diabetic kidney complication: Secondary | ICD-10-CM | POA: Insufficient documentation

## 2024-02-10 DIAGNOSIS — Z9483 Pancreas transplant status: Secondary | ICD-10-CM | POA: Insufficient documentation

## 2024-02-10 DIAGNOSIS — Z79899 Other long term (current) drug therapy: Secondary | ICD-10-CM | POA: Diagnosis not present

## 2024-02-10 DIAGNOSIS — B259 Cytomegaloviral disease, unspecified: Secondary | ICD-10-CM | POA: Diagnosis not present

## 2024-02-10 DIAGNOSIS — N390 Urinary tract infection, site not specified: Secondary | ICD-10-CM | POA: Diagnosis not present

## 2024-02-10 DIAGNOSIS — D631 Anemia in chronic kidney disease: Secondary | ICD-10-CM | POA: Diagnosis not present

## 2024-02-10 DIAGNOSIS — E559 Vitamin D deficiency, unspecified: Secondary | ICD-10-CM | POA: Insufficient documentation

## 2024-02-10 DIAGNOSIS — Z789 Other specified health status: Secondary | ICD-10-CM | POA: Diagnosis not present

## 2024-02-10 DIAGNOSIS — Z09 Encounter for follow-up examination after completed treatment for conditions other than malignant neoplasm: Secondary | ICD-10-CM | POA: Insufficient documentation

## 2024-02-10 DIAGNOSIS — Z940 Kidney transplant status: Secondary | ICD-10-CM | POA: Diagnosis present

## 2024-02-10 DIAGNOSIS — Z114 Encounter for screening for human immunodeficiency virus [HIV]: Secondary | ICD-10-CM | POA: Insufficient documentation

## 2024-02-10 LAB — CBC WITH DIFFERENTIAL/PLATELET
Abs Immature Granulocytes: 0.03 10*3/uL (ref 0.00–0.07)
Basophils Absolute: 0.1 10*3/uL (ref 0.0–0.1)
Basophils Relative: 2 %
Eosinophils Absolute: 0.9 10*3/uL — ABNORMAL HIGH (ref 0.0–0.5)
Eosinophils Relative: 15 %
HCT: 33.4 % — ABNORMAL LOW (ref 39.0–52.0)
Hemoglobin: 10.5 g/dL — ABNORMAL LOW (ref 13.0–17.0)
Immature Granulocytes: 1 %
Lymphocytes Relative: 31 %
Lymphs Abs: 1.8 10*3/uL (ref 0.7–4.0)
MCH: 30.8 pg (ref 26.0–34.0)
MCHC: 31.4 g/dL (ref 30.0–36.0)
MCV: 97.9 fL (ref 80.0–100.0)
Monocytes Absolute: 0.5 10*3/uL (ref 0.1–1.0)
Monocytes Relative: 9 %
Neutro Abs: 2.6 10*3/uL (ref 1.7–7.7)
Neutrophils Relative %: 42 %
Platelets: 162 10*3/uL (ref 150–400)
RBC: 3.41 MIL/uL — ABNORMAL LOW (ref 4.22–5.81)
RDW: 13.4 % (ref 11.5–15.5)
WBC: 5.9 10*3/uL (ref 4.0–10.5)
nRBC: 0 % (ref 0.0–0.2)

## 2024-02-10 LAB — BASIC METABOLIC PANEL
Anion gap: 7 (ref 5–15)
BUN: 40 mg/dL — ABNORMAL HIGH (ref 8–23)
CO2: 23 mmol/L (ref 22–32)
Calcium: 9.2 mg/dL (ref 8.9–10.3)
Chloride: 108 mmol/L (ref 98–111)
Creatinine, Ser: 1.47 mg/dL — ABNORMAL HIGH (ref 0.61–1.24)
GFR, Estimated: 53 mL/min — ABNORMAL LOW (ref >60)
Glucose, Bld: 143 mg/dL — ABNORMAL HIGH (ref 70–99)
Potassium: 4.6 mmol/L (ref 3.5–5.1)
Sodium: 138 mmol/L (ref 135–145)

## 2024-02-10 LAB — MAGNESIUM: Magnesium: 1.8 mg/dL (ref 1.7–2.4)

## 2024-02-10 LAB — PHOSPHORUS: Phosphorus: 3.1 mg/dL (ref 2.5–4.6)

## 2024-02-28 DIAGNOSIS — Z940 Kidney transplant status: Principal | ICD-10-CM

## 2024-02-28 MED ORDER — TACROLIMUS 1 MG CAPSULE, IMMEDIATE-RELEASE
ORAL_CAPSULE | ORAL | 3 refills | 90.00 days | Status: CP
Start: 2024-02-28 — End: 2025-02-27

## 2024-04-02 DIAGNOSIS — Z1159 Encounter for screening for other viral diseases: Principal | ICD-10-CM

## 2024-04-02 DIAGNOSIS — Z79899 Other long term (current) drug therapy: Principal | ICD-10-CM

## 2024-04-02 DIAGNOSIS — Z940 Kidney transplant status: Principal | ICD-10-CM

## 2024-04-07 ENCOUNTER — Ambulatory Visit: Admit: 2024-04-07 | Discharge: 2024-04-08 | Payer: MEDICARE | Attending: Nephrology | Primary: Nephrology

## 2024-04-07 ENCOUNTER — Other Ambulatory Visit: Admit: 2024-04-07 | Discharge: 2024-04-08 | Payer: MEDICARE

## 2024-04-07 DIAGNOSIS — Z940 Kidney transplant status: Principal | ICD-10-CM

## 2024-04-07 DIAGNOSIS — Z1159 Encounter for screening for other viral diseases: Principal | ICD-10-CM

## 2024-04-07 DIAGNOSIS — Z79899 Other long term (current) drug therapy: Principal | ICD-10-CM

## 2024-04-13 DIAGNOSIS — D369 Benign neoplasm, unspecified site: Principal | ICD-10-CM

## 2024-04-13 DIAGNOSIS — Z940 Kidney transplant status: Principal | ICD-10-CM

## 2024-04-13 MED ORDER — MYCOPHENOLATE SODIUM 360 MG TABLET,DELAYED RELEASE
ORAL_TABLET | Freq: Two times a day (BID) | ORAL | 3 refills | 90.00000 days | Status: CP
Start: 2024-04-13 — End: 2025-04-13

## 2024-04-17 DIAGNOSIS — K200 Eosinophilic esophagitis: Principal | ICD-10-CM

## 2024-05-02 ENCOUNTER — Emergency Department

## 2024-05-02 ENCOUNTER — Inpatient Hospital Stay
Admission: EM | Admit: 2024-05-02 | Discharge: 2024-05-11 | DRG: 871 | Disposition: A | Discharge status: Home / Self Care | Attending: Student | Admitting: Student

## 2024-05-02 ENCOUNTER — Encounter: Payer: Self-pay | Admitting: Emergency Medicine

## 2024-05-02 ENCOUNTER — Other Ambulatory Visit: Payer: Self-pay

## 2024-05-02 DIAGNOSIS — N1831 Chronic kidney disease, stage 3a: Secondary | ICD-10-CM | POA: Diagnosis present

## 2024-05-02 DIAGNOSIS — I129 Hypertensive chronic kidney disease with stage 1 through stage 4 chronic kidney disease, or unspecified chronic kidney disease: Secondary | ICD-10-CM | POA: Diagnosis present

## 2024-05-02 DIAGNOSIS — Z794 Long term (current) use of insulin: Secondary | ICD-10-CM

## 2024-05-02 DIAGNOSIS — N179 Acute kidney failure, unspecified: Secondary | ICD-10-CM | POA: Diagnosis present

## 2024-05-02 DIAGNOSIS — D696 Thrombocytopenia, unspecified: Secondary | ICD-10-CM | POA: Diagnosis present

## 2024-05-02 DIAGNOSIS — Z841 Family history of disorders of kidney and ureter: Secondary | ICD-10-CM

## 2024-05-02 DIAGNOSIS — D631 Anemia in chronic kidney disease: Secondary | ICD-10-CM | POA: Diagnosis present

## 2024-05-02 DIAGNOSIS — E861 Hypovolemia: Secondary | ICD-10-CM | POA: Diagnosis present

## 2024-05-02 DIAGNOSIS — E7800 Pure hypercholesterolemia, unspecified: Secondary | ICD-10-CM | POA: Diagnosis present

## 2024-05-02 DIAGNOSIS — Z6821 Body mass index (BMI) 21.0-21.9, adult: Secondary | ICD-10-CM

## 2024-05-02 DIAGNOSIS — R1310 Dysphagia, unspecified: Secondary | ICD-10-CM | POA: Diagnosis present

## 2024-05-02 DIAGNOSIS — A419 Sepsis, unspecified organism: Principal | ICD-10-CM | POA: Diagnosis present

## 2024-05-02 DIAGNOSIS — R52 Pain, unspecified: Secondary | ICD-10-CM

## 2024-05-02 DIAGNOSIS — T8619 Other complication of kidney transplant: Secondary | ICD-10-CM | POA: Diagnosis present

## 2024-05-02 DIAGNOSIS — R6520 Severe sepsis without septic shock: Secondary | ICD-10-CM | POA: Diagnosis present

## 2024-05-02 DIAGNOSIS — Z8249 Family history of ischemic heart disease and other diseases of the circulatory system: Secondary | ICD-10-CM

## 2024-05-02 DIAGNOSIS — R509 Fever, unspecified: Secondary | ICD-10-CM | POA: Diagnosis not present

## 2024-05-02 DIAGNOSIS — R339 Retention of urine, unspecified: Secondary | ICD-10-CM | POA: Diagnosis present

## 2024-05-02 DIAGNOSIS — R011 Cardiac murmur, unspecified: Secondary | ICD-10-CM | POA: Diagnosis present

## 2024-05-02 DIAGNOSIS — J9601 Acute respiratory failure with hypoxia: Secondary | ICD-10-CM | POA: Diagnosis present

## 2024-05-02 DIAGNOSIS — Z7982 Long term (current) use of aspirin: Secondary | ICD-10-CM

## 2024-05-02 DIAGNOSIS — Z7901 Long term (current) use of anticoagulants: Secondary | ICD-10-CM

## 2024-05-02 DIAGNOSIS — E8721 Acute metabolic acidosis: Secondary | ICD-10-CM | POA: Diagnosis present

## 2024-05-02 DIAGNOSIS — D84821 Immunodeficiency due to drugs: Secondary | ICD-10-CM | POA: Diagnosis present

## 2024-05-02 DIAGNOSIS — R6521 Severe sepsis with septic shock: Secondary | ICD-10-CM | POA: Diagnosis present

## 2024-05-02 DIAGNOSIS — Z7960 Long term (current) use of unspecified immunomodulators and immunosuppressants: Secondary | ICD-10-CM

## 2024-05-02 DIAGNOSIS — Z1152 Encounter for screening for COVID-19: Secondary | ICD-10-CM

## 2024-05-02 DIAGNOSIS — Z79899 Other long term (current) drug therapy: Secondary | ICD-10-CM

## 2024-05-02 DIAGNOSIS — J90 Pleural effusion, not elsewhere classified: Secondary | ICD-10-CM | POA: Diagnosis present

## 2024-05-02 DIAGNOSIS — D72829 Elevated white blood cell count, unspecified: Secondary | ICD-10-CM | POA: Diagnosis not present

## 2024-05-02 DIAGNOSIS — R630 Anorexia: Secondary | ICD-10-CM | POA: Diagnosis present

## 2024-05-02 DIAGNOSIS — G9349 Other encephalopathy: Secondary | ICD-10-CM | POA: Diagnosis present

## 2024-05-02 DIAGNOSIS — E1065 Type 1 diabetes mellitus with hyperglycemia: Secondary | ICD-10-CM | POA: Diagnosis present

## 2024-05-02 DIAGNOSIS — Y830 Surgical operation with transplant of whole organ as the cause of abnormal reaction of the patient, or of later complication, without mention of misadventure at the time of the procedure: Secondary | ICD-10-CM | POA: Diagnosis present

## 2024-05-02 DIAGNOSIS — A4151 Sepsis due to Escherichia coli [E. coli]: Principal | ICD-10-CM | POA: Diagnosis present

## 2024-05-02 DIAGNOSIS — Z9641 Presence of insulin pump (external) (internal): Secondary | ICD-10-CM | POA: Diagnosis present

## 2024-05-02 DIAGNOSIS — Z833 Family history of diabetes mellitus: Secondary | ICD-10-CM

## 2024-05-02 DIAGNOSIS — B9620 Unspecified Escherichia coli [E. coli] as the cause of diseases classified elsewhere: Secondary | ICD-10-CM

## 2024-05-02 DIAGNOSIS — E1022 Type 1 diabetes mellitus with diabetic chronic kidney disease: Secondary | ICD-10-CM | POA: Diagnosis present

## 2024-05-02 DIAGNOSIS — R5381 Other malaise: Secondary | ICD-10-CM | POA: Diagnosis present

## 2024-05-02 DIAGNOSIS — T380X5A Adverse effect of glucocorticoids and synthetic analogues, initial encounter: Secondary | ICD-10-CM | POA: Diagnosis not present

## 2024-05-02 DIAGNOSIS — N390 Urinary tract infection, site not specified: Secondary | ICD-10-CM | POA: Diagnosis present

## 2024-05-02 DIAGNOSIS — J811 Chronic pulmonary edema: Secondary | ICD-10-CM | POA: Diagnosis present

## 2024-05-02 DIAGNOSIS — K200 Eosinophilic esophagitis: Secondary | ICD-10-CM | POA: Diagnosis present

## 2024-05-02 DIAGNOSIS — I959 Hypotension, unspecified: Secondary | ICD-10-CM

## 2024-05-02 DIAGNOSIS — Z79624 Long term (current) use of inhibitors of nucleotide synthesis: Secondary | ICD-10-CM

## 2024-05-02 LAB — BETA-HYDROXYBUTYRIC ACID: Beta-Hydroxybutyric Acid: 0.16 mmol/L (ref 0.05–0.27)

## 2024-05-02 LAB — CBC WITH DIFFERENTIAL/PLATELET
Abs Immature Granulocytes: 0.32 10*3/uL — ABNORMAL HIGH (ref 0.00–0.07)
Basophils Absolute: 0 10*3/uL (ref 0.0–0.1)
Basophils Relative: 1 %
Eosinophils Absolute: 0 10*3/uL (ref 0.0–0.5)
Eosinophils Relative: 0 %
HCT: 33.8 % — ABNORMAL LOW (ref 39.0–52.0)
Hemoglobin: 10.3 g/dL — ABNORMAL LOW (ref 13.0–17.0)
Immature Granulocytes: 22 %
Lymphocytes Relative: 13 %
Lymphs Abs: 0.2 10*3/uL — ABNORMAL LOW (ref 0.7–4.0)
MCH: 30.9 pg (ref 26.0–34.0)
MCHC: 30.5 g/dL (ref 30.0–36.0)
MCV: 101.5 fL — ABNORMAL HIGH (ref 80.0–100.0)
Monocytes Absolute: 0 10*3/uL — ABNORMAL LOW (ref 0.1–1.0)
Monocytes Relative: 1 %
Neutro Abs: 0.9 10*3/uL — ABNORMAL LOW (ref 1.7–7.7)
Neutrophils Relative %: 63 %
Platelets: 112 10*3/uL — ABNORMAL LOW (ref 150–400)
RBC: 3.33 MIL/uL — ABNORMAL LOW (ref 4.22–5.81)
RDW: 13 % (ref 11.5–15.5)
Smear Review: NORMAL
WBC: 1.5 10*3/uL — ABNORMAL LOW (ref 4.0–10.5)
nRBC: 0 % (ref 0.0–0.2)

## 2024-05-02 LAB — COMPREHENSIVE METABOLIC PANEL WITH GFR
ALT: 25 U/L (ref 0–44)
AST: 42 U/L — ABNORMAL HIGH (ref 15–41)
Albumin: 3.3 g/dL — ABNORMAL LOW (ref 3.5–5.0)
Alkaline Phosphatase: 64 U/L (ref 38–126)
Anion gap: 14 (ref 5–15)
BUN: 43 mg/dL — ABNORMAL HIGH (ref 8–23)
CO2: 18 mmol/L — ABNORMAL LOW (ref 22–32)
Calcium: 8.9 mg/dL (ref 8.9–10.3)
Chloride: 110 mmol/L (ref 98–111)
Creatinine, Ser: 2.07 mg/dL — ABNORMAL HIGH (ref 0.61–1.24)
GFR, Estimated: 35 mL/min — ABNORMAL LOW (ref >60)
Glucose, Bld: 101 mg/dL — ABNORMAL HIGH (ref 70–99)
Potassium: 4 mmol/L (ref 3.5–5.1)
Sodium: 142 mmol/L (ref 135–145)
Total Bilirubin: 1.3 mg/dL — ABNORMAL HIGH (ref 0.0–1.2)
Total Protein: 6.4 g/dL — ABNORMAL LOW (ref 6.5–8.1)

## 2024-05-02 LAB — LACTIC ACID, PLASMA: Lactic Acid, Venous: 5.2 mmol/L (ref 0.5–1.9)

## 2024-05-02 LAB — CBG MONITORING, ED: Glucose-Capillary: 98 mg/dL (ref 70–99)

## 2024-05-02 LAB — PROTIME-INR
INR: 1.4 — ABNORMAL HIGH (ref 0.8–1.2)
Prothrombin Time: 17 s — ABNORMAL HIGH (ref 11.4–15.2)

## 2024-05-02 MED ORDER — METRONIDAZOLE 500 MG/100ML IV SOLN
500.0000 mg | Freq: Once | INTRAVENOUS | Status: AC
Start: 2024-05-02 — End: 2024-05-03
  Administered 2024-05-02: 500 mg via INTRAVENOUS
  Filled 2024-05-02: qty 100

## 2024-05-02 MED ORDER — ACETAMINOPHEN 500 MG PO TABS
1000.0000 mg | ORAL_TABLET | Freq: Once | ORAL | Status: AC
  Administered 2024-05-02: 1000 mg via ORAL
  Filled 2024-05-02: qty 2

## 2024-05-02 MED ORDER — VANCOMYCIN HCL IN DEXTROSE 1-5 GM/200ML-% IV SOLN
1000.0000 mg | Freq: Once | INTRAVENOUS | Status: AC
Start: 2024-05-02 — End: 2024-05-03
  Administered 2024-05-03: 1000 mg via INTRAVENOUS
  Filled 2024-05-02: qty 200

## 2024-05-02 MED ORDER — LACTATED RINGERS IV BOLUS (SEPSIS)
1000.0000 mL | Freq: Once | INTRAVENOUS | Status: AC
  Administered 2024-05-02: 1000 mL via INTRAVENOUS

## 2024-05-02 MED ORDER — SODIUM CHLORIDE 0.9 % IV SOLN
2.0000 g | Freq: Once | INTRAVENOUS | Status: AC
  Administered 2024-05-02: 2 g via INTRAVENOUS
  Filled 2024-05-02: qty 12.5

## 2024-05-02 MED ORDER — LACTATED RINGERS IV SOLN: INTRAVENOUS | Status: DC

## 2024-05-02 NOTE — Progress Notes (Signed)
 CODE SEPSIS - PHARMACY COMMUNICATION  **Broad Spectrum Antibiotics should be administered within 1 hour of Sepsis diagnosis**  Time Code Sepsis Called/Page Received: 5/20 @ 2210  Antibiotics Ordered: Cefepime, Vancomycin   Time of 1st antibiotic administration: Cefepime 2 gm IV X 1 on 5/20 @ 2251   Additional action taken by pharmacy:   If necessary, Name of Provider/Nurse Contacted:     Filmore Molyneux D ,PharmD Clinical Pharmacist  05/02/2024  11:02 PM

## 2024-05-02 NOTE — ED Triage Notes (Addendum)
 To ER from home via EMS for "sick person". Found on floor for EMS. Received 2nd dose of Hepatitis B vaccine yesterday and developed a fever today. Fever of 101.1 oral for EMS and tachy. Had a syncopal episode.   Patient is a type 1 diabetic with a pump and kidney transplant.

## 2024-05-02 NOTE — ED Provider Notes (Signed)
 [facility] Provider Note    Event Date/Time   First MD Initiated Contact with Patient 05/02/24 2144     (approximate)   History   Fever and Near Syncope   HPI Rick Wang is a 68 y.o. male with history of DM1, kidney transplant presenting today for fever.  Patient was found on floor by EMS at his home.  Reportedly received second dose of hepatitis B vaccine yesterday and then started having a fever today.  Had a syncopal episode witnessed by wife at home.  Did not hit his head.  She felt he was feverish and called EMS.  He noted 1 episode of nausea with vomiting.  Otherwise denies cough, congestion, shortness of breath, chest pain, abdominal pain, diarrhea, constipation.  Feels body aches all over.  History of kidney transplant back in 2012 and still on immunotherapy     Physical Exam   Triage Vital Signs: ED Triage Vitals [05/02/24 2139]  Encounter Vitals Group     BP (!) 113/92     Systolic BP Percentile      Diastolic BP Percentile      Pulse Rate (!) 114     Resp 20     Temp (!) 102.6 F (39.2 C)     Temp Source Oral     SpO2 100 %     Weight 144 lb 10 oz (65.6 kg)     Height 5\' 10"  (1.778 m)     Head Circumference      Peak Flow      Pain Score 0     Pain Loc      Pain Education      Exclude from Growth Chart     Most recent vital signs: Vitals:   05/02/24 2139 05/02/24 2318  BP: (!) 113/92 (!) 102/58  Pulse: (!) 114 (!) 112  Resp: 20 (!) 22  Temp: (!) 102.6 F (39.2 C)   SpO2: 100% 100%   Physical Exam: I have reviewed the vital signs and nursing notes. General: Awake, alert, no acute distress.  Ill appearing Head:  Atraumatic, normocephalic.   ENT:  EOM intact, PERRL. Oral mucosa is pink and moist with no lesions. Neck: Neck is supple with full range of motion, No meningeal signs. Cardiovascular:  tachycardia, RR, No murmurs. Peripheral pulses palpable and equal bilaterally. Respiratory:  Symmetrical chest wall  expansion.  No rhonchi, rales, or wheezes.  Good air movement throughout.  No use of accessory muscles.   Musculoskeletal:  No cyanosis or edema. Moving extremities with full ROM Abdomen:  Soft, nontender, nondistended. Neuro:  GCS 15, moving all four extremities, interacting appropriately. Speech clear. Psych:  Calm, appropriate.   Skin:  Warm, dry, no rash.    ED Results / Procedures / Treatments   Labs (all labs ordered are listed, but only abnormal results are displayed) Labs Reviewed  COMPREHENSIVE METABOLIC PANEL WITH GFR - Abnormal; Notable for the following components:      Result Value   CO2 18 (*)    Glucose, Bld 101 (*)    BUN 43 (*)    Creatinine, Ser 2.07 (*)    Total Protein 6.4 (*)    Albumin 3.3 (*)    AST 42 (*)    Total Bilirubin 1.3 (*)    GFR, Estimated 35 (*)    All other components within normal limits  LACTIC ACID, PLASMA - Abnormal; Notable for the following components:   Lactic Acid, Venous 5.2 (*)  All other components within normal limits  CBC WITH DIFFERENTIAL/PLATELET - Abnormal; Notable for the following components:   WBC 1.5 (*)    RBC 3.33 (*)    Hemoglobin 10.3 (*)    HCT 33.8 (*)    MCV 101.5 (*)    Platelets 112 (*)    Neutro Abs 0.9 (*)    Lymphs Abs 0.2 (*)    Monocytes Absolute 0.0 (*)    Abs Immature Granulocytes 0.32 (*)    All other components within normal limits  PROTIME-INR - Abnormal; Notable for the following components:   Prothrombin Time 17.0 (*)    INR 1.4 (*)    All other components within normal limits  RESP PANEL BY RT-PCR (RSV, FLU A&B, COVID)  RVPGX2  CULTURE, BLOOD (ROUTINE X 2)  CULTURE, BLOOD (ROUTINE X 2)  BETA-HYDROXYBUTYRIC ACID  LACTIC ACID, PLASMA  URINALYSIS, W/ REFLEX TO CULTURE (INFECTION SUSPECTED)  PATHOLOGIST SMEAR REVIEW  CBG MONITORING, ED     EKG My EKG interpretation: Rate of 115, sinus tachycardia, normal axis, normal intervals.  No acute ST elevations or  depressions   RADIOLOGY Independently interpreted chest x-ray with no acute pathology   PROCEDURES:  Critical Care performed: Yes, see critical care procedure note(s)  .Critical Care  Performed by: Kandee Orion, MD Authorized by: Kandee Orion, MD   Critical care provider statement:    Critical care time (minutes):  30   Critical care was necessary to treat or prevent imminent or life-threatening deterioration of the following conditions:  Sepsis   Critical care was time spent personally by me on the following activities:  Development of treatment plan with patient or surrogate, discussions with consultants, evaluation of patient's response to treatment, examination of patient, ordering and review of laboratory studies, ordering and review of radiographic studies, ordering and performing treatments and interventions, pulse oximetry, re-evaluation of patient's condition and review of old charts   I assumed direction of critical care for this patient from another provider in my specialty: no      MEDICATIONS ORDERED IN ED: Medications  lactated ringers  infusion (has no administration in time range)  metroNIDAZOLE (FLAGYL) IVPB 500 mg (500 mg Intravenous New Bag/Given 05/02/24 2333)  vancomycin (VANCOCIN) IVPB 1000 mg/200 mL premix (has no administration in time range)  lactated ringers  bolus 1,000 mL (1,000 mLs Intravenous New Bag/Given 05/02/24 2251)    And  lactated ringers  bolus 1,000 mL (1,000 mLs Intravenous New Bag/Given 05/02/24 2252)  ceFEPIme (MAXIPIME) 2 g in sodium chloride 0.9 % 100 mL IVPB (0 g Intravenous Stopped 05/02/24 2328)  acetaminophen (TYLENOL) tablet 1,000 mg (1,000 mg Oral Given 05/02/24 2243)     IMPRESSION / MDM / ASSESSMENT AND PLAN / ED COURSE  I reviewed the triage vital signs and the nursing notes.                              Differential diagnosis includes, but is not limited to, sepsis, neutropenic fever, UTI, DKA, COVID, flu, RSV, vaccine  reaction  Patient's presentation is most consistent with acute presentation with potential threat to life or bodily function.  Patient is a 68 year old male presenting today for fever, syncope.  On arrival he is febrile at 102.6 F and given Tylenol.  Also in sinus tachycardia.  Given 30 cc/kg fluid bolus and started on sepsis pathway.  Chest x-ray unremarkable.  Patient with leukopenia but no absolute neutropenia.  Lactic acid  of 5.2.  CMP with slight AKI on chronic CKD.  Negative for COVID, flu, RSV.  Beta hydroxybutyrate negative.  Patient signed out pending results of UA and admission for ongoing sepsis care.  Repeat lactic was ordered.  Plan for admission afterwards.  The patient is on the cardiac monitor to evaluate for evidence of arrhythmia and/or significant heart rate changes.     FINAL CLINICAL IMPRESSION(S) / ED DIAGNOSES   Final diagnoses:  Sepsis, due to unspecified organism, unspecified whether acute organ dysfunction present (HCC)  Septic shock (HCC)  AKI (acute kidney injury) (HCC)  Body aches     Rx / DC Orders   ED Discharge Orders     None        Note:  This document was prepared using Dragon voice recognition software and may include unintentional dictation errors.   Kandee Orion, MD 05/03/24 [phone]

## 2024-05-02 NOTE — Sepsis Progress Note (Addendum)
Elink monitoring for the code sepsis protocol.   Notified provider of need to order 3rd lactic acid.

## 2024-05-02 NOTE — Group Note (Deleted)
 Date:  05/02/2024 Time:  9:45 PM  Group Topic/Focus:  Wrap-Up Group:   The focus of this group is to help patients review their daily goal of treatment and discuss progress on daily workbooks.     Participation Level:  {BHH PARTICIPATION EAVWU:98119}  Participation Quality:  {BHH PARTICIPATION QUALITY:22265}  Affect:  {BHH AFFECT:22266}  Cognitive:  {BHH COGNITIVE:22267}  Insight: {BHH Insight2:20797}  Engagement in Group:  {BHH ENGAGEMENT IN JYNWG:95621}  Modes of Intervention:  {BHH MODES OF INTERVENTION:22269}  Additional Comments:  ***  Maglione,Preciosa Bundrick E 05/02/2024, 9:45 PM

## 2024-05-03 ENCOUNTER — Inpatient Hospital Stay

## 2024-05-03 DIAGNOSIS — T380X5A Adverse effect of glucocorticoids and synthetic analogues, initial encounter: Secondary | ICD-10-CM | POA: Diagnosis not present

## 2024-05-03 DIAGNOSIS — D72829 Elevated white blood cell count, unspecified: Secondary | ICD-10-CM | POA: Diagnosis not present

## 2024-05-03 DIAGNOSIS — E1022 Type 1 diabetes mellitus with diabetic chronic kidney disease: Secondary | ICD-10-CM | POA: Diagnosis present

## 2024-05-03 DIAGNOSIS — R7881 Bacteremia: Secondary | ICD-10-CM

## 2024-05-03 DIAGNOSIS — R1310 Dysphagia, unspecified: Secondary | ICD-10-CM | POA: Diagnosis present

## 2024-05-03 DIAGNOSIS — D84821 Immunodeficiency due to drugs: Secondary | ICD-10-CM | POA: Diagnosis present

## 2024-05-03 DIAGNOSIS — A419 Sepsis, unspecified organism: Secondary | ICD-10-CM | POA: Diagnosis not present

## 2024-05-03 DIAGNOSIS — R509 Fever, unspecified: Secondary | ICD-10-CM | POA: Diagnosis present

## 2024-05-03 DIAGNOSIS — I129 Hypertensive chronic kidney disease with stage 1 through stage 4 chronic kidney disease, or unspecified chronic kidney disease: Secondary | ICD-10-CM | POA: Diagnosis present

## 2024-05-03 DIAGNOSIS — E1065 Type 1 diabetes mellitus with hyperglycemia: Secondary | ICD-10-CM | POA: Diagnosis present

## 2024-05-03 DIAGNOSIS — R6521 Severe sepsis with septic shock: Secondary | ICD-10-CM | POA: Diagnosis present

## 2024-05-03 DIAGNOSIS — N390 Urinary tract infection, site not specified: Secondary | ICD-10-CM | POA: Diagnosis present

## 2024-05-03 DIAGNOSIS — N179 Acute kidney failure, unspecified: Secondary | ICD-10-CM | POA: Diagnosis present

## 2024-05-03 DIAGNOSIS — J9601 Acute respiratory failure with hypoxia: Secondary | ICD-10-CM | POA: Diagnosis present

## 2024-05-03 DIAGNOSIS — D696 Thrombocytopenia, unspecified: Secondary | ICD-10-CM | POA: Diagnosis present

## 2024-05-03 DIAGNOSIS — D631 Anemia in chronic kidney disease: Secondary | ICD-10-CM | POA: Diagnosis present

## 2024-05-03 DIAGNOSIS — T8619 Other complication of kidney transplant: Secondary | ICD-10-CM | POA: Diagnosis present

## 2024-05-03 DIAGNOSIS — Z7901 Long term (current) use of anticoagulants: Secondary | ICD-10-CM | POA: Diagnosis not present

## 2024-05-03 DIAGNOSIS — G9349 Other encephalopathy: Secondary | ICD-10-CM | POA: Diagnosis present

## 2024-05-03 DIAGNOSIS — E7800 Pure hypercholesterolemia, unspecified: Secondary | ICD-10-CM | POA: Diagnosis present

## 2024-05-03 DIAGNOSIS — J90 Pleural effusion, not elsewhere classified: Secondary | ICD-10-CM | POA: Diagnosis present

## 2024-05-03 DIAGNOSIS — Y830 Surgical operation with transplant of whole organ as the cause of abnormal reaction of the patient, or of later complication, without mention of misadventure at the time of the procedure: Secondary | ICD-10-CM | POA: Diagnosis present

## 2024-05-03 DIAGNOSIS — A4151 Sepsis due to Escherichia coli [E. coli]: Secondary | ICD-10-CM | POA: Diagnosis present

## 2024-05-03 DIAGNOSIS — Z794 Long term (current) use of insulin: Secondary | ICD-10-CM | POA: Diagnosis not present

## 2024-05-03 DIAGNOSIS — G9340 Encephalopathy, unspecified: Secondary | ICD-10-CM

## 2024-05-03 DIAGNOSIS — R6520 Severe sepsis without septic shock: Secondary | ICD-10-CM | POA: Diagnosis not present

## 2024-05-03 DIAGNOSIS — Z1152 Encounter for screening for COVID-19: Secondary | ICD-10-CM | POA: Diagnosis not present

## 2024-05-03 DIAGNOSIS — N1831 Chronic kidney disease, stage 3a: Secondary | ICD-10-CM | POA: Diagnosis present

## 2024-05-03 DIAGNOSIS — J811 Chronic pulmonary edema: Secondary | ICD-10-CM | POA: Diagnosis present

## 2024-05-03 DIAGNOSIS — B9620 Unspecified Escherichia coli [E. coli] as the cause of diseases classified elsewhere: Secondary | ICD-10-CM

## 2024-05-03 DIAGNOSIS — E8721 Acute metabolic acidosis: Secondary | ICD-10-CM | POA: Diagnosis present

## 2024-05-03 DIAGNOSIS — Z940 Kidney transplant status: Secondary | ICD-10-CM

## 2024-05-03 LAB — BLOOD CULTURE ID PANEL (REFLEXED) - BCID2

## 2024-05-03 LAB — CBC
HCT: 27.2 % — ABNORMAL LOW (ref 39.0–52.0)
Hemoglobin: 8.6 g/dL — ABNORMAL LOW (ref 13.0–17.0)
MCH: 31.2 pg (ref 26.0–34.0)
MCHC: 31.6 g/dL (ref 30.0–36.0)
MCV: 98.6 fL (ref 80.0–100.0)
Platelets: 96 10*3/uL — ABNORMAL LOW (ref 150–400)
RBC: 2.76 MIL/uL — ABNORMAL LOW (ref 4.22–5.81)
RDW: 13.4 % (ref 11.5–15.5)
WBC: 10.7 10*3/uL — ABNORMAL HIGH (ref 4.0–10.5)
nRBC: 0 % (ref 0.0–0.2)

## 2024-05-03 LAB — URINALYSIS, W/ REFLEX TO CULTURE (INFECTION SUSPECTED)
Bilirubin Urine: NEGATIVE
Glucose, UA: NEGATIVE mg/dL
Ketones, ur: NEGATIVE mg/dL
Nitrite: NEGATIVE
Protein, ur: >=300 mg/dL — AB
Specific Gravity, Urine: 1.012 (ref 1.005–1.030)
Squamous Epithelial / HPF: 0 /HPF (ref 0–5)
WBC, UA: >50 WBC/hpf (ref 0–5)
pH: 5 (ref 5.0–8.0)

## 2024-05-03 LAB — RESP PANEL BY RT-PCR (RSV, FLU A&B, COVID)  RVPGX2
Influenza A by PCR: NEGATIVE
Influenza B by PCR: NEGATIVE
Resp Syncytial Virus by PCR: NEGATIVE
SARS Coronavirus 2 by RT PCR: NEGATIVE

## 2024-05-03 LAB — BASIC METABOLIC PANEL WITH GFR
Anion gap: 14 (ref 5–15)
BUN: 44 mg/dL — ABNORMAL HIGH (ref 8–23)
CO2: 16 mmol/L — ABNORMAL LOW (ref 22–32)
Calcium: 8.1 mg/dL — ABNORMAL LOW (ref 8.9–10.3)
Chloride: 107 mmol/L (ref 98–111)
Creatinine, Ser: 2.73 mg/dL — ABNORMAL HIGH (ref 0.61–1.24)
GFR, Estimated: 25 mL/min — ABNORMAL LOW (ref >60)
Glucose, Bld: 157 mg/dL — ABNORMAL HIGH (ref 70–99)
Potassium: 3.7 mmol/L (ref 3.5–5.1)
Sodium: 137 mmol/L (ref 135–145)

## 2024-05-03 LAB — HIV ANTIBODY (ROUTINE TESTING W REFLEX): HIV Screen 4th Generation wRfx: NONREACTIVE

## 2024-05-03 LAB — GLUCOSE, CAPILLARY
Glucose-Capillary: 190 mg/dL — ABNORMAL HIGH (ref 70–99)
Glucose-Capillary: 145 mg/dL — ABNORMAL HIGH (ref 70–99)
Glucose-Capillary: 129 mg/dL — ABNORMAL HIGH (ref 70–99)
Glucose-Capillary: 117 mg/dL — ABNORMAL HIGH (ref 70–99)
Glucose-Capillary: 111 mg/dL — ABNORMAL HIGH (ref 70–99)
Glucose-Capillary: 108 mg/dL — ABNORMAL HIGH (ref 70–99)
Glucose-Capillary: 109 mg/dL — ABNORMAL HIGH (ref 70–99)
Glucose-Capillary: 109 mg/dL — ABNORMAL HIGH (ref 70–99)
Glucose-Capillary: 125 mg/dL — ABNORMAL HIGH (ref 70–99)
Glucose-Capillary: 125 mg/dL — ABNORMAL HIGH (ref 70–99)
Glucose-Capillary: 109 mg/dL — ABNORMAL HIGH (ref 70–99)
Glucose-Capillary: 128 mg/dL — ABNORMAL HIGH (ref 70–99)
Glucose-Capillary: 130 mg/dL — ABNORMAL HIGH (ref 70–99)
Glucose-Capillary: 136 mg/dL — ABNORMAL HIGH (ref 70–99)

## 2024-05-03 LAB — MRSA NEXT GEN BY PCR, NASAL: MRSA by PCR Next Gen: DETECTED — AB

## 2024-05-03 LAB — LACTIC ACID, PLASMA
Lactic Acid, Venous: 5.1 mmol/L (ref 0.5–1.9)
Lactic Acid, Venous: 5.3 mmol/L (ref 0.5–1.9)
Lactic Acid, Venous: 5.5 mmol/L (ref 0.5–1.9)
Lactic Acid, Venous: 5.7 mmol/L (ref 0.5–1.9)
Lactic Acid, Venous: 6 mmol/L (ref 0.5–1.9)

## 2024-05-03 LAB — HEMOGLOBIN A1C
Hgb A1c MFr Bld: 5.5 % (ref 4.8–5.6)
Mean Plasma Glucose: 111.15 mg/dL

## 2024-05-03 LAB — PHOSPHORUS: Phosphorus: 2.4 mg/dL — ABNORMAL LOW (ref 2.5–4.6)

## 2024-05-03 LAB — MAGNESIUM: Magnesium: 1 mg/dL — ABNORMAL LOW (ref 1.7–2.4)

## 2024-05-03 LAB — PATHOLOGIST SMEAR REVIEW

## 2024-05-03 MED ORDER — MUPIROCIN 2 % EX OINT
1.0000 | TOPICAL_OINTMENT | Freq: Two times a day (BID) | CUTANEOUS | Status: AC
  Administered 2024-05-03 – 2024-05-07 (×10): 1 via NASAL
  Filled 2024-05-03: qty 22

## 2024-05-03 MED ORDER — INSULIN REGULAR(HUMAN) IN NACL 100-0.9 UT/100ML-% IV SOLN
INTRAVENOUS | Status: DC
  Administered 2024-05-03: 1.5 [IU]/h via INTRAVENOUS
  Filled 2024-05-03: qty 100

## 2024-05-03 MED ORDER — DEXTROSE IN LACTATED RINGERS 5 % IV SOLN: INTRAVENOUS | Status: AC

## 2024-05-03 MED ORDER — VANCOMYCIN HCL 500 MG/100ML IV SOLN
500.0000 mg | Freq: Once | INTRAVENOUS | Status: AC
  Administered 2024-05-03: 500 mg via INTRAVENOUS
  Filled 2024-05-03: qty 100

## 2024-05-03 MED ORDER — FAMOTIDINE 20 MG PO TABS: 20.0000 mg | ORAL_TABLET | Freq: Two times a day (BID) | ORAL | Status: DC

## 2024-05-03 MED ORDER — MAGNESIUM SULFATE 4 GM/100ML IV SOLN
4.0000 g | Freq: Once | INTRAVENOUS | Status: AC
  Administered 2024-05-03: 4 g via INTRAVENOUS
  Filled 2024-05-03: qty 100

## 2024-05-03 MED ORDER — FENTANYL CITRATE PF 50 MCG/ML IJ SOSY: 12.5000 ug | PREFILLED_SYRINGE | INTRAMUSCULAR | Status: DC | PRN

## 2024-05-03 MED ORDER — POLYETHYLENE GLYCOL 3350 17 G PO PACK
17.0000 g | PACK | Freq: Every day | ORAL | Status: DC | PRN
  Administered 2024-05-10: 17 g via ORAL
  Filled 2024-05-03: qty 1

## 2024-05-03 MED ORDER — NOREPINEPHRINE 4 MG/250ML-% IV SOLN
0.0000 ug/min | INTRAVENOUS | Status: DC
  Administered 2024-05-03: 5 ug/min via INTRAVENOUS
  Administered 2024-05-03: 12 ug/min via INTRAVENOUS
  Filled 2024-05-03 (×2): qty 250

## 2024-05-03 MED ORDER — SODIUM CHLORIDE 0.9 % IV SOLN
2.0000 g | INTRAVENOUS | Status: DC
  Administered 2024-05-03 – 2024-05-09 (×7): 2 g via INTRAVENOUS
  Filled 2024-05-03 (×7): qty 20

## 2024-05-03 MED ORDER — HEPARIN SODIUM (PORCINE) 5000 UNIT/ML IJ SOLN
5000.0000 [IU] | Freq: Three times a day (TID) | INTRAMUSCULAR | Status: DC
  Administered 2024-05-03 – 2024-05-07 (×13): 5000 [IU] via SUBCUTANEOUS
  Filled 2024-05-03 (×13): qty 1

## 2024-05-03 MED ORDER — INSULIN ASPART 100 UNIT/ML IJ SOLN: 0.0000 [IU] | Freq: Three times a day (TID) | INTRAMUSCULAR | Status: DC

## 2024-05-03 MED ORDER — ONDANSETRON HCL 4 MG/2ML IJ SOLN: 4.0000 mg | Freq: Four times a day (QID) | INTRAMUSCULAR | Status: DC | PRN

## 2024-05-03 MED ORDER — PANTOPRAZOLE SODIUM 40 MG IV SOLR
40.0000 mg | Freq: Two times a day (BID) | INTRAVENOUS | Status: DC
Start: 2024-05-03 — End: 2024-05-03

## 2024-05-03 MED ORDER — K PHOS MONO-SOD PHOS DI & MONO 155-852-130 MG PO TABS
500.0000 mg | ORAL_TABLET | ORAL | Status: AC
  Administered 2024-05-03 (×2): 500 mg via ORAL
  Filled 2024-05-03 (×2): qty 2

## 2024-05-03 MED ORDER — INSULIN ASPART 100 UNIT/ML IJ SOLN: 0.0000 [IU] | Freq: Every day | INTRAMUSCULAR | Status: DC

## 2024-05-03 MED ORDER — LACTATED RINGERS IV BOLUS (SEPSIS)
1000.0000 mL | Freq: Once | INTRAVENOUS | Status: AC
  Administered 2024-05-03: 1000 mL via INTRAVENOUS

## 2024-05-03 MED ORDER — HEPARIN SODIUM (PORCINE) 5000 UNIT/ML IJ SOLN: 5000.0000 [IU] | Freq: Three times a day (TID) | INTRAMUSCULAR | Status: DC

## 2024-05-03 MED ORDER — DOCUSATE SODIUM 100 MG PO CAPS: 100.0000 mg | ORAL_CAPSULE | Freq: Two times a day (BID) | ORAL | Status: DC | PRN

## 2024-05-03 MED ORDER — CHLORHEXIDINE GLUCONATE CLOTH 2 % EX PADS
6.0000 | MEDICATED_PAD | Freq: Every day | CUTANEOUS | Status: AC
  Administered 2024-05-03 – 2024-05-07 (×5): 6 via TOPICAL

## 2024-05-03 MED ORDER — ACETAMINOPHEN 325 MG PO TABS
650.0000 mg | ORAL_TABLET | Freq: Four times a day (QID) | ORAL | Status: DC | PRN
  Administered 2024-05-03: 650 mg via ORAL
  Filled 2024-05-03: qty 2

## 2024-05-03 MED ORDER — SODIUM CHLORIDE 0.9 % IV SOLN
2.0000 g | Freq: Two times a day (BID) | INTRAVENOUS | Status: DC
  Filled 2024-05-03: qty 12.5

## 2024-05-03 MED ORDER — ORAL CARE MOUTH RINSE: 15.0000 mL | OROMUCOSAL | Status: DC | PRN

## 2024-05-03 MED ORDER — VANCOMYCIN HCL 1750 MG/350ML IV SOLN: 1750.0000 mg | INTRAVENOUS | Status: DC

## 2024-05-03 MED ORDER — HYDROCORTISONE SOD SUC (PF) 100 MG IJ SOLR
100.0000 mg | Freq: Three times a day (TID) | INTRAMUSCULAR | Status: DC
  Administered 2024-05-03 – 2024-05-06 (×10): 100 mg via INTRAVENOUS
  Filled 2024-05-03 (×11): qty 2

## 2024-05-03 MED ORDER — PANTOPRAZOLE SODIUM 40 MG PO TBEC
40.0000 mg | DELAYED_RELEASE_TABLET | Freq: Two times a day (BID) | ORAL | Status: DC
  Administered 2024-05-03 – 2024-05-11 (×17): 40 mg via ORAL
  Filled 2024-05-03 (×17): qty 1

## 2024-05-03 NOTE — H&P (Addendum)
 NAME:  LUCIEN BUDNEY, MRN:  [id number], DOB:  1956/09/27, LOS: 0 ADMISSION DATE:  05/02/2024, CONSULTATION DATE:  05/03/24 REFERRING MD:  Meredith Stalls CHIEF COMPLAINT:   Fever and Syncope  History of Present Illness:  Kamali Nephew is 68 year old with a PMH significant HTN, HLD, Diabetes type 1, Renal transplant 2012, CKD, DVT, Eosinophilic esophagitis with dysphagia that presented 5/20 to the ER with complaints of Fever and syncope. To review, patient received Hep B vaccine Monday, on Tuesday developed Malaise, poor appetite, fever, nausea, confusion. Wife states that he was standing and his legs gave out, she assisted him to the ground. EMS was called, upon arrival to the ER BP 113/92, HR 114, Temp 102.6. Labs revealed chronic Leukopenia 1.5, hgb 10.3, Plts 112, BUN 43, creat 2.07, Lactic acid 5.2, PT 17, INR 1.4, Beta hydroxybutyrate negative, RVP negative. UA+, culture pending, BC x 2, and he was started on Cefepime and Vancomycin. He was given 30 ml/kg IVF bolus. With ongoing hypotension he was given and additional 1 liter (3 Liters total) and started on Levophed infusion. He is now admitted to the ICU for further management of his septic shock.     Pertinent  Medical History  HTN, HLD, Diabetes type 1, Renal transplant 2012, CKD, DVT, Eosinophilic esophagitis with dysphagia  Significant Hospital Events: Including procedures, antibiotic start and stop dates in addition to other pertinent events   5/21: Admitted to ICU with septic shock due to UTI. BC x 2, RVP negative, Urine culture pending, Cefepime + Vancomycin. S/p 3 Liters IVF. Starting insulin  drip and discontinuing patients pump.   Interim History / Subjective:   Objective    Blood pressure (!) 84/64, pulse (!) 101, temperature (!) 101.2 F (38.4 C), temperature source Oral, resp. rate (!) 27, height 5\' 10"  (1.778 m), weight 65.6 kg, SpO2 97%.        Intake/Output Summary (Last 24 hours) at 05/03/2024 0216 Last data filed at 05/03/2024  0046 Gross per 24 hour  Intake 99.04 ml  Output --  Net 99.04 ml   Filed Weights   05/02/24 2139  Weight: 65.6 kg   Physical Exam Vitals and nursing note reviewed.  Constitutional:      Appearance: Normal appearance. He is normal weight.  HENT:     Head: Normocephalic and atraumatic.     Right Ear: External ear normal.     Left Ear: External ear normal.     Nose: Nose normal. No congestion or rhinorrhea.     Mouth/Throat:     Mouth: Mucous membranes are moist.  Eyes:     Extraocular Movements: Extraocular movements intact.     Pupils: Pupils are equal, round, and reactive to light.  Cardiovascular:     Rate and Rhythm: Normal rate and regular rhythm.     Pulses: Normal pulses.  Pulmonary:     Effort: Pulmonary effort is normal.     Breath sounds: Normal breath sounds.  Abdominal:     General: Bowel sounds are normal.     Palpations: Abdomen is soft.  Musculoskeletal:        General: No swelling. Normal range of motion.  Skin:    General: Skin is warm and dry.     Capillary Refill: Capillary refill takes less than 2 seconds.  Neurological:     Mental Status: He is alert.     Comments: Alert and oriented x 3, slight confusion on events of day, PERRL, +5/5 strength  Psychiatric:  Mood and Affect: Mood normal.    Resolved problem list  None Assessment and Plan   #Septic Shock secondary to UTI #Immunocompromised, chronic #Lactic Acidosis #Hypotension - Daily CBC - Cultures: F/u BC x 2 and urine cultures, trend lactic until clears, RVP negative - Antibiotics: Cefepime + Vancomycin - s/p 3 liters IVPB, continue IVF @ 150 ml/hr - Levophed infusion, titrate for MAP > 65 - Strict I/O's: alert provider if UOP < 0.5 mL/kg/hr - Persistent hypotension consider stress dose steroids    Acute on Chronic Kidney Disease  Renal Transplant 2012 @ [facility] - Reach out to Transplant team in AM - Daily BMP - Avoid nephrotoxic agents - Continue home Mycophenolate and  Tacrolimus  pending med reconciliation  - Fluid resuscitation  - Renal US   Diabetes Type 1, utilizes insulin  pump - Remove Insulin  pump and start Endotool - Glucose checks per Endotool - Consider endocrine consult  #Hypertension #Hyperlipidemia - Hold home antihypertensives - Continue home statin pending med rec  #Thrombocytosis #Anemia Hgb 10.3, Plts 112,  PT 17, INR 1.4 on admission - Monitor for bleeding - Daily CBC  #Eosinophilic Esophagitis w/ Dysphagia Patient is s/p recent EGD with biopsy's - Continue home PPI - NPO pending SLP evaluation  Best Practice (right click and "Reselect all SmartList Selections" daily)   Diet/type: NPO w/ oral meds DVT prophylaxis SCD, SQH (monitor with Thrombocytopenia) Pressure ulcer(s): N/A GI prophylaxis: PPI Lines: N/A Foley:  N/A Code Status:  full code per patient and spouse Last date of multidisciplinary goals of care discussion [All care and plans explained to patient and wife on admission, questions answered]  Labs   CBC: Recent Labs  Lab 05/02/24 2157  WBC 1.5*  NEUTROABS 0.9*  HGB 10.3*  HCT 33.8*  MCV 101.5*  PLT 112*   Basic Metabolic Panel: Recent Labs  Lab 05/02/24 2157  NA 142  K 4.0  CL 110  CO2 18*  GLUCOSE 101*  BUN 43*  CREATININE 2.07*  CALCIUM 8.9   GFR: Estimated Creatinine Clearance: 33 mL/min (A) (by C-G formula based on SCr of 2.07 mg/dL (H)). Recent Labs  Lab 05/02/24 2157 05/02/24 2336 05/03/24 0106  WBC 1.5*  --   --   LATICACIDVEN 5.2* 5.3* 6.0*   Liver Function Tests: Recent Labs  Lab 05/02/24 2157  AST 42*  ALT 25  ALKPHOS 64  BILITOT 1.3*  PROT 6.4*  ALBUMIN 3.3*   Coagulation Profile: Recent Labs  Lab 05/02/24 2157  INR 1.4*   HbA1C: Hemoglobin A1C  Date/Time Value Ref Range Status  11/26/2016 12:00 AM 6.5  Final  04/03/2015 08:36 AM 6.2 (H) % Final    Comment:    4.0-6.0 NOTE: New Reference Range  02/19/15    CBG: Recent Labs  Lab 05/02/24 2205   GLUCAP 98   Review of Systems:   Denies CP, shortness of breath, abdominal pain, nausea. Has no complaints  Past Medical History:  He,  has a past medical history of Chronic kidney disease, Diabetes mellitus without complication (HCC), Heart murmur, Hypercholesteremia, and Hypertension.   Surgical History:   Past Surgical History:  Procedure Laterality Date   CATARACT EXTRACTION     COLONOSCOPY WITH PROPOFOL  N/A 04/06/2016   Procedure: COLONOSCOPY WITH PROPOFOL ;  Surgeon: Marnee Sink, MD;  [facility];  Service: Endoscopy;  Laterality: N/A;   HERNIA REPAIR  08/01/10   umbilical   PERITONEAL CATHETER INSERTION     TRANSPLANTATION RENAL Right 09/01/11     Social  History:   reports that he has never smoked. He has never used smokeless tobacco. He reports that he does not drink alcohol and does not use drugs.   Family History:  His family history includes Congestive Heart Failure in his mother; Diabetes in his father and mother; Kidney disease in his father; Prostate cancer in his father.   Allergies No Known Allergies   Home Medications  Prior to Admission medications   Medication Sig Start Date End Date Taking? Authorizing Provider  amoxicillin -clavulanate (AUGMENTIN ) 875-125 MG tablet Take 1 tablet by mouth 2 (two) times daily. 11/25/22   Normie Becton, FNP  apixaban (ELIQUIS) 2.5 MG TABS tablet Take by mouth.    [provider]  aspirin 81 MG tablet Take by mouth. 08/08/13   [provider]  atorvastatin (LIPITOR) 20 MG tablet Take by mouth. 08/08/13   [provider]  Brinzolamide-Brimonidine [facility]) 1-0.2 % SUSP Apply to eye. 08/08/13   [provider]  docusate sodium (COLACE) 100 MG capsule Take by mouth. 08/08/13   [provider]  ferrous sulfate 325 (65 FE) MG tablet Take by mouth. 08/08/13   [provider]  furosemide (LASIX) 20 MG tablet Take 20 mg by mouth. 07/16/15 07/15/16  [provider]   insulin  aspart (NOVOLOG ) 100 UNIT/ML injection INJECT 7 UNITS UNDER THE SKIN BEFORE BREAKFAST, 5 UNITS BEFORE LUNCH AND 4 UNITS BEFORE DINNER 04/03/17   Lamon Pillow, MD  Insulin  Disposable Pump (OMNIPOD 5 G6 INTRO, GEN 5,) KIT Inject into the skin. 09/26/21   [provider]  Insulin  Glargine (TOUJEO SOLOSTAR) 300 UNIT/ML SOPN Inject 16 Units into the skin daily.     [provider]  Insulin  Pen Needle (BD PEN NEEDLE NANO U/F) 32G X 4 MM MISC USE 1 EACH ONCE DAILY 12/18/20   [provider]  Insulin  Syringe-Needle U-100 (BD INSULIN  SYRINGE ULTRAFINE) 31G X 5/16" 0.5 ML MISC  10/09/13   [provider]  lisinopril (PRINIVIL,ZESTRIL) 40 MG tablet Take 40 mg by mouth. 08/15/15 08/14/16  [provider]  mycophenolate (MYFORTIC) 180 MG EC tablet Take by mouth. 08/08/13   [provider]  NOVOLOG  100 UNIT/ML injection INJECT 7 UNITS UNDER THE SKIN BEFORE BREAKFAST, 5 UNITS BEFORE LUNCH AND 4 UNITS BEFORE DINNER 04/03/19   Nikki Barters, MD  [facility] LANCETS FINE MISC  10/09/13   [provider]  Polyethylene Glycol 3350 GRAN Take by mouth. 08/08/13   [provider]  SENSIPAR 60 MG tablet  05/20/17   [provider]  tacrolimus  (PROGRAF ) 1 MG capsule  05/10/15   [provider]    Critical care time: 80 minutes

## 2024-05-03 NOTE — Progress Notes (Signed)
 Pharmacy Antibiotic Note  Rick Wang is a 68 y.o. male admitted on 05/02/2024 with sepsis.  Pharmacy has been consulted for Vancomycin, Cefepime dosing.  Plan: Cefepime 2 gm IV X 1 given in ED on 5/20 @ 2251. Cefepime 2 gm IV Q12H ordered to start on 5/21 @ 1100.  Vancomycin 1 gm IV X 1 given in ED on 5/21 @ 0054.  Additional Vanc 500 mg IV X 1 ordered to make total loading dose of 1500 mg.  Vancomycin 1750 mg IV Q48H ordered to start on 5/23 @ 0100.  AUC = 517.6 Vanc trough = 8.9   Height: 5\' 10"  (177.8 cm) Weight: 67.3 kg (148 lb 5.9 oz) IBW/kg (Calculated) : 73  Temp (24hrs), Avg:100.1 F (37.8 C), Min:98.5 F (36.9 C), Max:102.6 F (39.2 C)  Recent Labs  Lab 05/02/24 2157 05/02/24 2336 05/03/24 0106 05/03/24 0259  WBC 1.5*  --   --   --   CREATININE 2.07*  --   --   --   LATICACIDVEN 5.2* 5.3* 6.0* 5.5*    Estimated Creatinine Clearance: 33.9 mL/min (A) (by C-G formula based on SCr of 2.07 mg/dL (H)).    No Known Allergies  Antimicrobials this admission:   >>    >>   Dose adjustments this admission:   Microbiology results:  BCx:   UCx:    Sputum:    MRSA PCR:   Thank you for allowing pharmacy to be a part of this patient's care.  Sophia Cubero D 05/03/2024 4:45 AM

## 2024-05-03 NOTE — Consult Note (Signed)
 NAME: Rick Wang  DOB: 1956/03/01  MRN: [id number]  Date/Time: 05/03/2024 1:26 PM  REQUESTING PROVIDER: Mario Sicard REASON FOR CONSULT: E. coli bacteremia in a patient with renal transplant ? THAER MIYOSHI is a 68 y.o. with a history of Renal transplant in 2012 , HTN, HLD, Type 1 DM , eosinophilic esophagitispresented to ED via EMS on 05/02/24 after being found on the floor- He had taken his 2nd dose of HEPB vaccine the day before and had developed a fever  05/02/24  BP 102/58 !  Temp 102.6 F (39.2 C) !  Pulse Rate 112 !  Resp 22 !  SpO2 100 %  Weight 144 lb 10 oz  Height 5\' 10"  (1.778 m)     Latest Reference Range & Units 05/02/24  WBC 4.0 - 10.5 K/uL 1.5 (L)  Hemoglobin 13.0 - 17.0 g/dL 21.3 (L)  HCT 08.6 - 57.8 % 33.8 (L)  Platelets 150 - 400 K/uL 112 (L)  Creatinine 0.61 - 1.24 mg/dL 4.69 (H)   Blood culture sent and it came back as Ecoli Admitted to ICU with septic shock Patient since hospitalization has some increased frequency of urine and stronger he but has not been passing much No obvious dysuria No hematuria  Past Medical History:  Diagnosis Date   Chronic kidney disease    Bilateral kidney failure. Rcvd new RIGHT kidney.   Diabetes mellitus without complication (HCC)    Heart murmur    followed by PCP   Hypercholesteremia    Hypertension     Past Surgical History:  Procedure Laterality Date   CATARACT EXTRACTION     COLONOSCOPY WITH PROPOFOL  N/A 04/06/2016   Procedure: COLONOSCOPY WITH PROPOFOL ;  Surgeon: Marnee Sink, MD;  Location: [facility];  Service: Endoscopy;  Laterality: N/A;   HERNIA REPAIR  08/01/10   umbilical   PERITONEAL CATHETER INSERTION     TRANSPLANTATION RENAL Right 09/01/11    Social History   Socioeconomic History   Marital status: Married    Spouse name: Not on file   Number of children: Not on file   Years of education: Not on file   Highest education level: Not on file  Occupational History   Not on file   Tobacco Use   Smoking status: Never   Smokeless tobacco: Never  Substance and Sexual Activity   Alcohol use: No   Drug use: No   Sexual activity: Not on file  Other Topics Concern   Not on file  Social History Narrative   Not on file   Social Drivers of Health   Financial Resource Strain: Low Risk  (07/01/2023)   Received from [facility]   Overall Financial Resource Strain (CARDIA)    Difficulty of Paying Living Expenses: Not hard at all  Food Insecurity: No Food Insecurity (05/03/2024)   Hunger Vital Sign    Worried About Running Out of Food in the Last Year: Never true    Ran Out of Food in the Last Year: Never true  Transportation Needs: No Transportation Needs (05/03/2024)   PRAPARE - Administrator, Civil Service (Medical): No    Lack of Transportation (Non-Medical): No  Physical Activity: Sufficiently Active (07/01/2023)   Received from [facility]   Exercise Vital Sign    Days of Exercise per Week: 3 days    Minutes of Exercise per Session: 60 min  Stress: Not on file  Social Connections: Socially Integrated (05/03/2024)   Social Connection  and Isolation Panel [NHANES]    Frequency of Communication with Friends and Family: More than three times a week    Frequency of Social Gatherings with Friends and Family: Twice a week    Attends Religious Services: More than 4 times per year    Active Member of [employer] or Organizations: Yes    Attends Engineer, structural: More than 4 times per year    Marital Status: Married  Catering manager Violence: Not At Risk (05/03/2024)   Humiliation, Afraid, Rape, and Kick questionnaire    Fear of Current or Ex-Partner: No    Emotionally Abused: No    Physically Abused: No    Sexually Abused: No    Family History  Problem Relation Age of Onset   Diabetes Mother    Congestive Heart Failure Mother    Prostate cancer Father    Diabetes Father    Kidney disease Father    No Known  Allergies I? Current Facility-Administered Medications  Medication Dose Route Frequency Provider Last Rate Last Admin   acetaminophen (TYLENOL) tablet 650 mg  650 mg Oral Q6H PRN Keene, Jeremiah D, NP   650 mg at 05/03/24 1253   cefTRIAXone (ROCEPHIN) 2 g in sodium chloride 0.9 % 100 mL IVPB  2 g Intravenous Q24H Aleskerov, Fuad, MD 200 mL/hr at 05/03/24 1207 2 g at 05/03/24 1207   Chlorhexidine Gluconate Cloth 2 % PADS 6 each  6 each Topical Daily Erskin Hearing, MD   6 each at 05/03/24 1032   dextrose 5 % in lactated ringers  infusion   Intravenous Continuous Adelita Agar, NP 75 mL/hr at 05/03/24 1200 Infusion Verify at 05/03/24 1200   docusate sodium (COLACE) capsule 100 mg  100 mg Oral BID PRN Adelita Agar, NP       heparin injection 5,000 Units  5,000 Units Subcutaneous Q8H Paliwal, Zenda Highman, MD   5,000 Units at 05/03/24 1303   hydrocortisone sodium succinate (SOLU-CORTEF) 100 MG injection 100 mg  100 mg Intravenous Q8H Aleskerov, Fuad, MD   100 mg at 05/03/24 1208   insulin  regular, human (MYXREDLIN) 100 units/ 100 mL infusion   Intravenous Continuous Adelita Agar, NP 0.7 mL/hr at 05/03/24 1200 0.7 Units/hr at 05/03/24 1200   mupirocin ointment (BACTROBAN) 2 % 1 Application  1 Application Nasal BID Aleskerov, Fuad, MD   1 Application at 05/03/24 1124   norepinephrine (LEVOPHED) 4mg  in (0.016 mg/mL) premix infusion  0-40 mcg/min Intravenous Continuous Sung, Jade J, MD 18.75 mL/hr at 05/03/24 1200 5 mcg/min at 05/03/24 1200   ondansetron (ZOFRAN) injection 4 mg  4 mg Intravenous Q6H PRN Adelita Agar, NP       Oral care mouth rinse  15 mL Mouth Rinse PRN Aleskerov, Fuad, MD       pantoprazole (PROTONIX) EC tablet 40 mg  40 mg Oral BID Nazari, Walid A, RPH   40 mg at 05/03/24 1100   polyethylene glycol (MIRALAX / GLYCOLAX) packet 17 g  17 g Oral Daily PRN Adelita Agar, NP         Abtx:  Anti-infectives (From admission, onward)    Start     Dose/Rate Route Frequency  Ordered Stop   05/05/24 0100  vancomycin (VANCOREADY) IVPB 1750 mg/350 mL  Status:  Discontinued        1,750 mg 175 mL/hr over 120 Minutes Intravenous Every 48 hours 05/03/24 0444 05/03/24 1054   05/03/24 1200  cefTRIAXone (ROCEPHIN) 2 g in sodium chloride 0.9 % 100 mL IVPB  2 g 200 mL/hr over 30 Minutes Intravenous Every 24 hours 05/03/24 1054     05/03/24 1100  ceFEPIme (MAXIPIME) 2 g in sodium chloride 0.9 % 100 mL IVPB  Status:  Discontinued        2 g 200 mL/hr over 30 Minutes Intravenous Every 12 hours 05/03/24 0441 05/03/24 1054   05/03/24 0530  vancomycin (VANCOREADY) IVPB 500 mg/100 mL        500 mg 100 mL/hr over 60 Minutes Intravenous  Once 05/03/24 0437 05/03/24 0618   05/02/24 2215  ceFEPIme (MAXIPIME) 2 g in sodium chloride 0.9 % 100 mL IVPB        2 g 200 mL/hr over 30 Minutes Intravenous  Once 05/02/24 2210 05/02/24 2328   05/02/24 2215  metroNIDAZOLE (FLAGYL) IVPB 500 mg        500 mg 100 mL/hr over 60 Minutes Intravenous  Once 05/02/24 2210 05/03/24 0046   05/02/24 2215  vancomycin (VANCOCIN) IVPB 1000 mg/200 mL premix        1,000 mg 200 mL/hr over 60 Minutes Intravenous  Once 05/02/24 2210 05/03/24 0157       REVIEW OF SYSTEMS:  Const:  fever,  chills, negative weight loss Eyes: negative diplopia or visual changes, negative eye pain ENT: negative coryza, negative sore throat Resp: negative cough, hemoptysis, dyspnea Cards: negative for chest pain, palpitations, lower extremity edema GU: Positive frequency, no dysuria and hematuria GI: Negative for abdominal pain, diarrhea, bleeding, constipation Skin: negative for rash and pruritus Heme: negative for easy bruising and gum/nose bleeding MS: weakness Neurolo: Syncopal-like episode Psych: negative for feelings of anxiety, depression  Endocrine: Type I diabetes Allergy/Immunology- negative for any medication or food allergies ?  Objective:  VITALS:  BP 125/69   Pulse (!) 114   Temp (!) 100.4 F  (38 C) (Oral)   Resp (!) 26   Ht 5\' 10"  (1.778 m)   Wt 67.3 kg   SpO2 90%   BMI 21.29 kg/m   PHYSICAL EXAM:  General: Patient is sleeping but wakes up easily Slow to respond to questions and need to be asked repeatedly  Head: Normocephalic, without obvious abnormality, atraumatic. Eyes: Keeps right eye closed but can open it. 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 No drainage or sinus tenderness. Lips, mucosa, and tongue normal. No Thrush Neck: Supple, symmetrical, no adenopathy, thyroid : non tender no carotid bruit and no JVD. Back: No CVA tenderness. Lungs: Bilateral air entry Heart: Tachycardia  Abdomen: Soft, non-tender,not distended. Bowel sounds normal. No masses Scar the lower abdomen Extremities: atraumatic, no cyanosis. No edema. No clubbing Skin: No rashes or lesions. Or bruising Lymph: Cervical, supraclavicular normal. Neurologic: Grossly non-focal Pertinent Labs Lab Results CBC    Component Value Date/Time   WBC 10.7 (H) 05/03/2024 0628   RBC 2.76 (L) 05/03/2024 0628   HGB 8.6 (L) 05/03/2024 0628   HGB 9.9 (L) 04/03/2015 0836   HCT 27.2 (L) 05/03/2024 0628   HCT 30.6 (L) 04/03/2015 0836   PLT 96 (L) 05/03/2024 0628   PLT 157 04/03/2015 0836   MCV 98.6 05/03/2024 0628   MCV 93 04/03/2015 0836   MCH 31.2 05/03/2024 0628   MCHC 31.6 05/03/2024 0628   RDW 13.4 05/03/2024 0628   RDW 13.3 04/03/2015 0836   LYMPHSABS 0.2 (L) 05/02/2024 2157   LYMPHSABS 0.7 (L) 04/03/2015 0836   MONOABS 0.0 (L) 05/02/2024 2157   MONOABS 0.4 04/03/2015 1610  EOSABS 0.0 05/02/2024 2157   EOSABS 0.5 04/03/2015 0836   BASOSABS 0.0 05/02/2024 2157   BASOSABS 0.1 04/03/2015 0836       Latest Ref Rng & Units 05/03/2024    6:28 AM 05/02/2024    9:57 PM 02/10/2024    8:46 AM  CMP  Glucose 70 - 99 mg/dL 578  469  629   BUN 8 - 23 mg/dL 44  43  40   Creatinine 0.61 - 1.24 mg/dL 5.28  4.13  2.44   Sodium 135 - 145 mmol/L 137  142  138    Potassium 3.5 - 5.1 mmol/L 3.7  4.0  4.6   Chloride 98 - 111 mmol/L 107  110  108   CO2 22 - 32 mmol/L 16  18  23    Calcium 8.9 - 10.3 mg/dL 8.1  8.9  9.2   Total Protein 6.5 - 8.1 g/dL  6.4    Total Bilirubin 0.0 - 1.2 mg/dL  1.3    Alkaline Phos 38 - 126 U/L  64    AST 15 - 41 U/L  42    ALT 0 - 44 U/L  25        Microbiology: Recent Results (from the past 240 hours)  Culture, blood (Routine x 2)     Status: None (Preliminary result)   Collection Time: 05/02/24  9:57 PM   Specimen: BLOOD  Result Value Ref Range Status   Specimen Description BLOOD BLOOD RIGHT ARM  Final   Special Requests   Final    BOTTLES DRAWN AEROBIC AND ANAEROBIC Blood Culture adequate volume   Culture  Setup Time   Final    GRAM NEGATIVE RODS ANAEROBIC BOTTLE ONLY CRITICAL VALUE NOTED.  VALUE IS CONSISTENT WITH PREVIOUSLY REPORTED AND CALLED VALUE.    Culture   Final    NO GROWTH < 12 HOURS Performed at [facility], [address] [place], [place] [address]    Report Status PENDING  Incomplete  Culture, blood (Routine x 2)     Status: None (Preliminary result)   Collection Time: 05/02/24  9:58 PM   Specimen: BLOOD  Result Value Ref Range Status   Specimen Description BLOOD BLOOD RIGHT ARM  Final   Special Requests   Final    BOTTLES DRAWN AEROBIC AND ANAEROBIC Blood Culture adequate volume   Culture  Setup Time   Final    GRAM NEGATIVE RODS ANAEROBIC BOTTLE ONLY Organism ID to follow CRITICAL RESULT CALLED TO, READ BACK BY AND VERIFIED WITH: SHEEMA HALLAJI PHARMD 1020 05/03/24 HNM    Culture   Final    NO GROWTH < 12 HOURS Performed at [facility], [address] [place], [place] [address]    Report Status PENDING  Incomplete  Blood Culture ID Panel (Reflexed)     Status: Abnormal   Collection Time: 05/02/24  9:58 PM  Result Value Ref Range Status   Enterococcus faecalis NOT DETECTED NOT DETECTED Final   Enterococcus Faecium NOT DETECTED NOT DETECTED Final    Listeria monocytogenes NOT DETECTED NOT DETECTED Final   Staphylococcus species NOT DETECTED NOT DETECTED Final   Staphylococcus aureus (BCID) NOT DETECTED NOT DETECTED Final   Staphylococcus epidermidis NOT DETECTED NOT DETECTED Final   Staphylococcus lugdunensis NOT DETECTED NOT DETECTED Final   Streptococcus species NOT DETECTED NOT DETECTED Final   Streptococcus agalactiae NOT DETECTED NOT DETECTED Final   Streptococcus pneumoniae NOT DETECTED NOT DETECTED Final   Streptococcus pyogenes NOT  DETECTED NOT DETECTED Final   A.calcoaceticus-baumannii NOT DETECTED NOT DETECTED Final   Bacteroides fragilis NOT DETECTED NOT DETECTED Final   Enterobacterales DETECTED (A) NOT DETECTED Final    Comment: Enterobacterales represent a large order of gram negative bacteria, not a single organism. CRITICAL RESULT CALLED TO, READ BACK BY AND VERIFIED WITH: SHEEMA HALLAJI PHARMD 1020 05/03/24 HNM    Enterobacter cloacae complex NOT DETECTED NOT DETECTED Final   Escherichia coli DETECTED (A) NOT DETECTED Final    Comment: CRITICAL RESULT CALLED TO, READ BACK BY AND VERIFIED WITH: SHEEMA HALLAJI PHARMD 1020 05/03/24 HNM    Klebsiella aerogenes NOT DETECTED NOT DETECTED Final   Klebsiella oxytoca NOT DETECTED NOT DETECTED Final   Klebsiella pneumoniae NOT DETECTED NOT DETECTED Final   Proteus species NOT DETECTED NOT DETECTED Final   Salmonella species NOT DETECTED NOT DETECTED Final   Serratia marcescens NOT DETECTED NOT DETECTED Final   Haemophilus influenzae NOT DETECTED NOT DETECTED Final   Neisseria meningitidis NOT DETECTED NOT DETECTED Final   Pseudomonas aeruginosa NOT DETECTED NOT DETECTED Final   Stenotrophomonas maltophilia NOT DETECTED NOT DETECTED Final   Candida albicans NOT DETECTED NOT DETECTED Final   Candida auris NOT DETECTED NOT DETECTED Final   Candida glabrata NOT DETECTED NOT DETECTED Final   Candida krusei NOT DETECTED NOT DETECTED Final   Candida parapsilosis NOT DETECTED NOT  DETECTED Final   Candida tropicalis NOT DETECTED NOT DETECTED Final   Cryptococcus neoformans/gattii NOT DETECTED NOT DETECTED Final   CTX-M ESBL NOT DETECTED NOT DETECTED Final   Carbapenem resistance IMP NOT DETECTED NOT DETECTED Final   Carbapenem resistance KPC NOT DETECTED NOT DETECTED Final   Carbapenem resistance NDM NOT DETECTED NOT DETECTED Final   Carbapenem resist OXA 48 LIKE NOT DETECTED NOT DETECTED Final   Carbapenem resistance VIM NOT DETECTED NOT DETECTED Final    Comment: Performed at [facility], [address] [place], [place] [address]  Resp panel by RT-PCR (RSV, Flu A&B, Covid) Anterior Nasal Swab     Status: None   Collection Time: 05/02/24 11:17 PM   Specimen: Anterior Nasal Swab  Result Value Ref Range Status   SARS Coronavirus 2 by RT PCR NEGATIVE NEGATIVE Final    Comment: (NOTE) SARS-CoV-2 target nucleic acids are NOT DETECTED.  The SARS-CoV-2 RNA is generally detectable in upper respiratory specimens during the acute phase of infection. The lowest concentration of SARS-CoV-2 viral copies this assay can detect is 138 copies/mL. A negative result does not preclude SARS-Cov-2 infection and should not be used as the sole basis for treatment or other patient management decisions. A negative result may occur with  improper specimen collection/handling, submission of specimen other than nasopharyngeal swab, presence of viral mutation(s) within the areas targeted by this assay, and inadequate number of viral copies(<138 copies/mL). A negative result must be combined with clinical observations, patient history, and epidemiological information. The expected result is Negative.  Fact Sheet for Patients:  BloggerCourse.com  Fact Sheet for Healthcare Providers:  SeriousBroker.it  This test is no t yet approved or cleared by the [place]  FDA and  has been authorized for detection and/or diagnosis of  SARS-CoV-2 by FDA under an Emergency Use Authorization (EUA). This EUA will remain  in effect (meaning this test can be used) for the duration of the COVID-19 declaration under Section 564(b)(1) of the Act, 21 U.S.C.section 360bbb-3(b)(1), unless the authorization is terminated  or revoked sooner.       Influenza A by  PCR NEGATIVE NEGATIVE Final   Influenza B by PCR NEGATIVE NEGATIVE Final    Comment: (NOTE) The Xpert Xpress SARS-CoV-2/FLU/RSV plus assay is intended as an aid in the diagnosis of influenza from Nasopharyngeal swab specimens and should not be used as a sole basis for treatment. Nasal washings and aspirates are unacceptable for Xpert Xpress SARS-CoV-2/FLU/RSV testing.  Fact Sheet for Patients: BloggerCourse.com  Fact Sheet for Healthcare Providers: SeriousBroker.it  This test is not yet approved or cleared by the [place]  FDA and has been authorized for detection and/or diagnosis of SARS-CoV-2 by FDA under an Emergency Use Authorization (EUA). This EUA will remain in effect (meaning this test can be used) for the duration of the COVID-19 declaration under Section 564(b)(1) of the Act, 21 U.S.C. section 360bbb-3(b)(1), unless the authorization is terminated or revoked.     Resp Syncytial Virus by PCR NEGATIVE NEGATIVE Final    Comment: (NOTE) Fact Sheet for Patients: BloggerCourse.com  Fact Sheet for Healthcare Providers: SeriousBroker.it  This test is not yet approved or cleared by the [place]  FDA and has been authorized for detection and/or diagnosis of SARS-CoV-2 by FDA under an Emergency Use Authorization (EUA). This EUA will remain in effect (meaning this test can be used) for the duration of the COVID-19 declaration under Section 564(b)(1) of the Act, 21 U.S.C. section 360bbb-3(b)(1), unless the authorization is terminated  or revoked.  Performed at [facility], [address] [place], [place] [address]   MRSA Next Gen by PCR, Nasal     Status: Abnormal   Collection Time: 05/03/24  4:00 AM   Specimen: Nasal Mucosa; Nasal Swab  Result Value Ref Range Status   MRSA by PCR Next Gen DETECTED (A) NOT DETECTED Final    Comment: RESULT CALLED TO, READ BACK BY AND VERIFIED WITH: Salina Craver RN [phone] 05/03/24 HNM (NOTE) The GeneXpert MRSA Assay (FDA approved for NASAL specimens only), is one component of a comprehensive MRSA colonization surveillance program. It is not intended to diagnose MRSA infection nor to guide or monitor treatment for MRSA infections. Test performance is not FDA approved in patients less than 70 years old. Performed at [facility], [address] [place], [place] [address]     IMAGING RESULTS: Chest x-ray no active disease I have personally reviewed the films ? Impression/recommendation ? E. coli bacteremia in a patient with renal transplant Has some urinary symptoms ?Urine culture has been sent Patient is currently on ceftriaxone Mycophenolate and tacrolimus  on hold Started on steroids as per nephrology  Mild encephalopathy secondary to the infection  End-stage renal disease was on PD in 2012 while he had kidney transplant Creatinine is 2  Anemia  Type 1 diabetes mellitus  Hypertension  Discussed the management in detail with the patient and the care team.     ________________________________________________ Discussed with patient, requesting provider Note:  This document was prepared using Dragon voice recognition software and may include unintentional dictation errors.

## 2024-05-03 NOTE — ED Provider Notes (Signed)
-----------------------------------------   12:48 AM on 05/03/2024 -----------------------------------------   Sepsis reassessment: Repeat lactic acid increased.  BP 84/52 after 30 cc/kilo IV lactated Ringer 's.  Will start third liter, Levophed and contact CCU intensivist for evaluation and admission.  ----------------------------------------- 1:04 AM on 05/03/2024 -----------------------------------------   Spoke with CCU intensivist NP Leontine Rana who will evaluate patient in the emergency department for admission.   Marri Mcneff J, MD 05/03/24 [phone]

## 2024-05-03 NOTE — Inpatient Diabetes Management (Signed)
 Inpatient Diabetes Program Recommendations  AACE/ADA: New Consensus Statement on Inpatient Glycemic Control (2015)  Target Ranges:  Prepandial:   less than 140 mg/dL      Peak postprandial:   less than 180 mg/dL (1-2 hours)      Critically ill patients:  140 - 180 mg/dL   Lab Results  Component Value Date   GLUCAP 117 (H) 05/03/2024   HGBA1C 5.5 05/03/2024    Review of Glycemic Control  Latest Reference Range & Units 05/03/24 03:46 05/03/24 06:26 05/03/24 07:45 05/03/24 09:03  Glucose-Capillary 70 - 99 mg/dL 478 (H) 295 (H) 621 (H) 117 (H)   Diabetes history: Type 1 DM  Outpatient Diabetes medications:  Omniopod- insulin  pump Current orders for Inpatient glycemic control:  IV insulin   Inpatient Diabetes Program Recommendations:    Agree with use of IV insulin  (since Omnipod stopped).    Thanks,  Josefa Ni, RN, BC-ADM Inpatient Diabetes Coordinator Pager [phone]  (8a-5p)

## 2024-05-03 NOTE — Progress Notes (Signed)
 1200-Patient frequently taking off oxygen.  1715- patient pulled out IV, taking off pulse ox, and removing oxygen. Mitts applied.

## 2024-05-03 NOTE — Progress Notes (Signed)
 Updated pt and his wife at bedside on plan of care.  All questions answered.    Cherylann Corpus, AGACNP-BC [facility] Prefer epic messenger for cross cover needs If after hours, please call E-link

## 2024-05-03 NOTE — Consult Note (Signed)
 [facility]  CONSULT NOTE    Date: 05/03/2024                  Patient Name:  Rick Wang  MRN: [id number]  DOB: 1956/04/05  Age / Sex: 68 y.o., male         PCP: Nikki Barters, MD                 Service Requesting Consult: Critical care                 Reason for Consult: Acute kidney injury on chronic kidney disease stage III AA            History of Present Illness: Rick Wang is a 68 y.o.  male with past medical conditions including diabetes, hyperlipidemia, hypertension, chronic kidney disease stage IIIa status post living donor renal transplant 2012, who was admitted to [facility] on 05/02/2024 for Lower urinary tract infectious disease [N39.0] Body aches [R52] AKI (acute kidney injury) (HCC) [N17.9] Septic shock (HCC) [A41.9, R65.21] Severe sepsis (HCC) [A41.9, R65.20] Hypotension, unspecified hypotension type [I95.9] Sepsis, due to unspecified organism, unspecified whether acute organ dysfunction present [facility]) [A41.9]  Patient presents to the emergency department after being found on the floor by EMS.  States he developed a fever after receiving second dose of hepatitis B vaccine and passed out.  Patient seen resting in bed, family at bedside.  Patient received a living donor renal transplant in 2012 at [facility].  Currently follows [facility] nephrology, Dr. Helga Loan.  Labs on ED arrival concerning for serum bicarb 18, BUN 43, creatinine 2.07 with GFR 35, lactic acid 5.2, white count 1.5 with hemoglobin 10.3, and platelets 112.  Respiratory panel negative for influenza, COVID-19, and RSV.  Blood cultures positive for E. coli and Enterobacterales.  Chest x-ray negative for acute findings.  UA appears turbid with proteinuria, leukocytes, and bacteria.  Creatinine continues to rise, 2.73 with GFR 25.  Medications: Outpatient medications: Medications Prior to Admission  Medication Sig Dispense Refill Last Dose/Taking   amoxicillin -clavulanate (AUGMENTIN )  875-125 MG tablet Take 1 tablet by mouth 2 (two) times daily. 20 tablet 0    apixaban (ELIQUIS) 2.5 MG TABS tablet Take by mouth.      aspirin 81 MG tablet Take by mouth.      atorvastatin (LIPITOR) 20 MG tablet Take by mouth.      Brinzolamide-Brimonidine (SIMBRINZA) 1-0.2 % SUSP Apply to eye.      docusate sodium (COLACE) 100 MG capsule Take by mouth.      ferrous sulfate 325 (65 FE) MG tablet Take by mouth.      furosemide (LASIX) 20 MG tablet Take 20 mg by mouth.      insulin  aspart (NOVOLOG ) 100 UNIT/ML injection INJECT 7 UNITS UNDER THE SKIN BEFORE BREAKFAST, 5 UNITS BEFORE LUNCH AND 4 UNITS BEFORE DINNER 10 mL 0    Insulin  Disposable Pump (OMNIPOD 5 G6 INTRO, GEN 5,) KIT Inject into the skin.      Insulin  Glargine (TOUJEO SOLOSTAR) 300 UNIT/ML SOPN Inject 16 Units into the skin daily.       Insulin  Pen Needle (BD PEN NEEDLE NANO U/F) 32G X 4 MM MISC USE 1 EACH ONCE DAILY      Insulin  Syringe-Needle U-100 (BD INSULIN  SYRINGE ULTRAFINE) 31G X 5/16" 0.5 ML MISC       lisinopril (PRINIVIL,ZESTRIL) 40 MG tablet Take 40 mg by mouth.      mycophenolate (MYFORTIC)  180 MG EC tablet Take by mouth.      NOVOLOG  100 UNIT/ML injection INJECT 7 UNITS UNDER THE SKIN BEFORE BREAKFAST, 5 UNITS BEFORE LUNCH AND 4 UNITS BEFORE DINNER 30 mL 3    ONETOUCH DELICA LANCETS FINE MISC       Polyethylene Glycol 3350 GRAN Take by mouth.      SENSIPAR 60 MG tablet       tacrolimus  (PROGRAF ) 1 MG capsule        Current medications: Current Facility-Administered Medications  Medication Dose Route Frequency Provider Last Rate Last Admin   acetaminophen (TYLENOL) tablet 650 mg  650 mg Oral Q6H PRN Keene, Jeremiah D, NP   650 mg at 05/03/24 1253   cefTRIAXone (ROCEPHIN) 2 g in sodium chloride 0.9 % 100 mL IVPB  2 g Intravenous Q24H Aleskerov, Fuad, MD 200 mL/hr at 05/03/24 1207 2 g at 05/03/24 1207   Chlorhexidine Gluconate Cloth 2 % PADS 6 each  6 each Topical Daily Erskin Hearing, MD   6 each at 05/03/24 1032    dextrose 5 % in lactated ringers  infusion   Intravenous Continuous Adelita Agar, NP 75 mL/hr at 05/03/24 1200 Infusion Verify at 05/03/24 1200   docusate sodium (COLACE) capsule 100 mg  100 mg Oral BID PRN Russell, Bridget, NP       heparin injection 5,000 Units  5,000 Units Subcutaneous Q8H Paliwal, Zenda Highman, MD   5,000 Units at 05/03/24 1303   hydrocortisone sodium succinate (SOLU-CORTEF) 100 MG injection 100 mg  100 mg Intravenous Q8H Aleskerov, Fuad, MD   100 mg at 05/03/24 1208   insulin  regular, human (MYXREDLIN) 100 units/ 100 mL infusion   Intravenous Continuous Adelita Agar, NP 0.7 mL/hr at 05/03/24 1200 0.7 Units/hr at 05/03/24 1200   mupirocin ointment (BACTROBAN) 2 % 1 Application  1 Application Nasal BID Aleskerov, Fuad, MD   1 Application at 05/03/24 1124   norepinephrine (LEVOPHED) 4mg  in (0.016 mg/mL) premix infusion  0-40 mcg/min Intravenous Continuous Sung, Jade J, MD 18.75 mL/hr at 05/03/24 1200 5 mcg/min at 05/03/24 1200   ondansetron (ZOFRAN) injection 4 mg  4 mg Intravenous Q6H PRN Adelita Agar, NP       Oral care mouth rinse  15 mL Mouth Rinse PRN Aleskerov, Fuad, MD       pantoprazole (PROTONIX) EC tablet 40 mg  40 mg Oral BID Nazari, Walid A, RPH   40 mg at 05/03/24 1100   polyethylene glycol (MIRALAX / GLYCOLAX) packet 17 g  17 g Oral Daily PRN Adelita Agar, NP          Allergies: No Known Allergies    Past Medical History: Past Medical History:  Diagnosis Date   Chronic kidney disease    Bilateral kidney failure. Rcvd new RIGHT kidney.   Diabetes mellitus without complication (HCC)    Heart murmur    followed by PCP   Hypercholesteremia    Hypertension      Past Surgical History: Past Surgical History:  Procedure Laterality Date   CATARACT EXTRACTION     COLONOSCOPY WITH PROPOFOL  N/A 04/06/2016   Procedure: COLONOSCOPY WITH PROPOFOL ;  Surgeon: Marnee Sink, MD;  Location: [facility];  Service: Endoscopy;  Laterality: N/A;    HERNIA REPAIR  08/01/10   umbilical   PERITONEAL CATHETER INSERTION     TRANSPLANTATION RENAL Right 09/01/11     Family History: Family History  Problem Relation Age of Onset   Diabetes Mother    Congestive Heart  Failure Mother    Prostate cancer Father    Diabetes Father    Kidney disease Father      Social History: Social History   Socioeconomic History   Marital status: Married    Spouse name: Not on file   Number of children: Not on file   Years of education: Not on file   Highest education level: Not on file  Occupational History   Not on file  Tobacco Use   Smoking status: Never   Smokeless tobacco: Never  Substance and Sexual Activity   Alcohol use: No   Drug use: No   Sexual activity: Not on file  Other Topics Concern   Not on file  Social History Narrative   Not on file   Social Drivers of Health   Financial Resource Strain: Low Risk  (07/01/2023)   Received from [facility]   Overall Financial Resource Strain (CARDIA)    Difficulty of Paying Living Expenses: Not hard at all  Food Insecurity: No Food Insecurity (05/03/2024)   Hunger Vital Sign    Worried About Running Out of Food in the Last Year: Never true    Ran Out of Food in the Last Year: Never true  Transportation Needs: No Transportation Needs (05/03/2024)   PRAPARE - Administrator, Civil Service (Medical): No    Lack of Transportation (Non-Medical): No  Physical Activity: Sufficiently Active (07/01/2023)   Received from [facility]   Exercise Vital Sign    Days of Exercise per Week: 3 days    Minutes of Exercise per Session: 60 min  Stress: Not on file  Social Connections: Socially Integrated (05/03/2024)   Social Connection and Isolation Panel [NHANES]    Frequency of Communication with Friends and Family: More than three times a week    Frequency of Social Gatherings with Friends and Family: Twice a week    Attends Religious Services: More  than 4 times per year    Active Member of [employer] or Organizations: Yes    Attends Engineer, structural: More than 4 times per year    Marital Status: Married  Catering manager Violence: Not At Risk (05/03/2024)   Humiliation, Afraid, Rape, and Kick questionnaire    Fear of Current or Ex-Partner: No    Emotionally Abused: No    Physically Abused: No    Sexually Abused: No     Review of Systems: Review of Systems  Constitutional:  Positive for fever. Negative for chills and malaise/fatigue.  HENT:  Negative for congestion, sore throat and tinnitus.   Eyes:  Negative for blurred vision and redness.  Respiratory:  Negative for cough, shortness of breath and wheezing.   Cardiovascular:  Negative for chest pain, palpitations, claudication and leg swelling.  Gastrointestinal:  Negative for abdominal pain, blood in stool, diarrhea, nausea and vomiting.  Genitourinary:  Negative for flank pain, frequency and hematuria.  Musculoskeletal:  Positive for falls. Negative for back pain and myalgias.  Skin:  Negative for rash.  Neurological:  Negative for dizziness, weakness and headaches.  Endo/Heme/Allergies:  Does not bruise/bleed easily.  Psychiatric/Behavioral:  Negative for depression. The patient is not nervous/anxious and does not have insomnia.     Vital Signs: Blood pressure 125/69, pulse (!) 114, temperature (!) 100.4 F (38 C), temperature source Oral, resp. rate (!) 26, height 5\' 10"  (1.778 m), weight 67.3 kg, SpO2 90%.  Weight trends: [facility] Weights   05/02/24 2139 05/03/24 0345  Weight: 65.6 kg 67.3 kg    Physical Exam: General: NAD  Head: Normocephalic, atraumatic. Moist oral mucosal membranes  Eyes: Anicteric  Neck: Supple  Lungs:  Clear to auscultation, normal effort  Heart: Regular rate and rhythm  Abdomen:  Soft, nontender, nondistended  Extremities: No peripheral edema.  Neurologic: Nonfocal, moving all four extremities  Skin: No lesions  Access: None      Lab results: Basic Metabolic Panel: Recent Labs  Lab 05/02/24 2157 05/03/24 0628  NA 142 137  K 4.0 3.7  CL 110 107  CO2 18* 16*  GLUCOSE 101* 157*  BUN 43* 44*  CREATININE 2.07* 2.73*  CALCIUM 8.9 8.1*  MG  --  1.0*  PHOS  --  2.4*    Liver Function Tests: Recent Labs  Lab 05/02/24 2157  AST 42*  ALT 25  ALKPHOS 64  BILITOT 1.3*  PROT 6.4*  ALBUMIN 3.3*   No results for input(s): "LIPASE", "AMYLASE" in the last 168 hours. No results for input(s): "AMMONIA" in the last 168 hours.  CBC: Recent Labs  Lab 05/02/24 2157 05/03/24 0628  WBC 1.5* 10.7*  NEUTROABS 0.9*  --   HGB 10.3* 8.6*  HCT 33.8* 27.2*  MCV 101.5* 98.6  PLT 112* 96*    Cardiac Enzymes: No results for input(s): "CKTOTAL", "CKMB", "CKMBINDEX", "TROPONINI" in the last 168 hours.  BNP: Invalid input(s): "POCBNP"  CBG: Recent Labs  Lab 05/03/24 0626 05/03/24 0745 05/03/24 0903 05/03/24 1101 05/03/24 1302  GLUCAP 145* 129* 117* 111* 108*    Microbiology: Results for orders placed or performed during the hospital encounter of 05/02/24  Culture, blood (Routine x 2)     Status: None (Preliminary result)   Collection Time: 05/02/24  9:57 PM   Specimen: BLOOD  Result Value Ref Range Status   Specimen Description BLOOD BLOOD RIGHT ARM  Final   Special Requests   Final    BOTTLES DRAWN AEROBIC AND ANAEROBIC Blood Culture adequate volume   Culture  Setup Time   Final    GRAM NEGATIVE RODS ANAEROBIC BOTTLE ONLY CRITICAL VALUE NOTED.  VALUE IS CONSISTENT WITH PREVIOUSLY REPORTED AND CALLED VALUE.    Culture   Final    NO GROWTH < 12 HOURS Performed at [facility], [address] [place], [place] [address]    Report Status PENDING  Incomplete  Culture, blood (Routine x 2)     Status: None (Preliminary result)   Collection Time: 05/02/24  9:58 PM   Specimen: BLOOD  Result Value Ref Range Status   Specimen Description BLOOD BLOOD RIGHT ARM  Final   Special Requests    Final    BOTTLES DRAWN AEROBIC AND ANAEROBIC Blood Culture adequate volume   Culture  Setup Time   Final    GRAM NEGATIVE RODS ANAEROBIC BOTTLE ONLY Organism ID to follow CRITICAL RESULT CALLED TO, READ BACK BY AND VERIFIED WITH: SHEEMA HALLAJI PHARMD 1020 05/03/24 HNM    Culture   Final    NO GROWTH < 12 HOURS Performed at [facility], [address], [place], [place] [address]    Report Status PENDING  Incomplete  Blood Culture ID Panel (Reflexed)     Status: Abnormal   Collection Time: 05/02/24  9:58 PM  Result Value Ref Range Status   Enterococcus faecalis NOT DETECTED NOT DETECTED Final   Enterococcus Faecium NOT DETECTED NOT DETECTED Final   Listeria monocytogenes NOT DETECTED NOT DETECTED Final   Staphylococcus species NOT DETECTED NOT  DETECTED Final   Staphylococcus aureus (BCID) NOT DETECTED NOT DETECTED Final   Staphylococcus epidermidis NOT DETECTED NOT DETECTED Final   Staphylococcus lugdunensis NOT DETECTED NOT DETECTED Final   Streptococcus species NOT DETECTED NOT DETECTED Final   Streptococcus agalactiae NOT DETECTED NOT DETECTED Final   Streptococcus pneumoniae NOT DETECTED NOT DETECTED Final   Streptococcus pyogenes NOT DETECTED NOT DETECTED Final   A.calcoaceticus-baumannii NOT DETECTED NOT DETECTED Final   Bacteroides fragilis NOT DETECTED NOT DETECTED Final   Enterobacterales DETECTED (A) NOT DETECTED Final    Comment: Enterobacterales represent a large order of gram negative bacteria, not a single organism. CRITICAL RESULT CALLED TO, READ BACK BY AND VERIFIED WITH: SHEEMA HALLAJI PHARMD 1020 05/03/24 HNM    Enterobacter cloacae complex NOT DETECTED NOT DETECTED Final   Escherichia coli DETECTED (A) NOT DETECTED Final    Comment: CRITICAL RESULT CALLED TO, READ BACK BY AND VERIFIED WITH: SHEEMA HALLAJI PHARMD 1020 05/03/24 HNM    Klebsiella aerogenes NOT DETECTED NOT DETECTED Final   Klebsiella oxytoca NOT DETECTED NOT DETECTED Final   Klebsiella  pneumoniae NOT DETECTED NOT DETECTED Final   Proteus species NOT DETECTED NOT DETECTED Final   Salmonella species NOT DETECTED NOT DETECTED Final   Serratia marcescens NOT DETECTED NOT DETECTED Final   Haemophilus influenzae NOT DETECTED NOT DETECTED Final   Neisseria meningitidis NOT DETECTED NOT DETECTED Final   Pseudomonas aeruginosa NOT DETECTED NOT DETECTED Final   Stenotrophomonas maltophilia NOT DETECTED NOT DETECTED Final   Candida albicans NOT DETECTED NOT DETECTED Final   Candida auris NOT DETECTED NOT DETECTED Final   Candida glabrata NOT DETECTED NOT DETECTED Final   Candida krusei NOT DETECTED NOT DETECTED Final   Candida parapsilosis NOT DETECTED NOT DETECTED Final   Candida tropicalis NOT DETECTED NOT DETECTED Final   Cryptococcus neoformans/gattii NOT DETECTED NOT DETECTED Final   CTX-M ESBL NOT DETECTED NOT DETECTED Final   Carbapenem resistance IMP NOT DETECTED NOT DETECTED Final   Carbapenem resistance KPC NOT DETECTED NOT DETECTED Final   Carbapenem resistance NDM NOT DETECTED NOT DETECTED Final   Carbapenem resist OXA 48 LIKE NOT DETECTED NOT DETECTED Final   Carbapenem resistance VIM NOT DETECTED NOT DETECTED Final    Comment: Performed at [facility], [address] [place], [place] [address]  Resp panel by RT-PCR (RSV, Flu A&B, Covid) Anterior Nasal Swab     Status: None   Collection Time: 05/02/24 11:17 PM   Specimen: Anterior Nasal Swab  Result Value Ref Range Status   SARS Coronavirus 2 by RT PCR NEGATIVE NEGATIVE Final    Comment: (NOTE) SARS-CoV-2 target nucleic acids are NOT DETECTED.  The SARS-CoV-2 RNA is generally detectable in upper respiratory specimens during the acute phase of infection. The lowest concentration of SARS-CoV-2 viral copies this assay can detect is 138 copies/mL. A negative result does not preclude SARS-Cov-2 infection and should not be used as the sole basis for treatment or other patient management decisions. A  negative result may occur with  improper specimen collection/handling, submission of specimen other than nasopharyngeal swab, presence of viral mutation(s) within the areas targeted by this assay, and inadequate number of viral copies(<138 copies/mL). A negative result must be combined with clinical observations, patient history, and epidemiological information. The expected result is Negative.  Fact Sheet for Patients:  BloggerCourse.com  Fact Sheet for Healthcare Providers:  SeriousBroker.it  This test is no t yet approved or cleared by the [place]  FDA and  has been  authorized for detection and/or diagnosis of SARS-CoV-2 by FDA under an Emergency Use Authorization (EUA). This EUA will remain  in effect (meaning this test can be used) for the duration of the COVID-19 declaration under Section 564(b)(1) of the Act, 21 U.S.C.section 360bbb-3(b)(1), unless the authorization is terminated  or revoked sooner.       Influenza A by PCR NEGATIVE NEGATIVE Final   Influenza B by PCR NEGATIVE NEGATIVE Final    Comment: (NOTE) The Xpert Xpress SARS-CoV-2/FLU/RSV plus assay is intended as an aid in the diagnosis of influenza from Nasopharyngeal swab specimens and should not be used as a sole basis for treatment. Nasal washings and aspirates are unacceptable for Xpert Xpress SARS-CoV-2/FLU/RSV testing.  Fact Sheet for Patients: BloggerCourse.com  Fact Sheet for Healthcare Providers: SeriousBroker.it  This test is not yet approved or cleared by the [place]  FDA and has been authorized for detection and/or diagnosis of SARS-CoV-2 by FDA under an Emergency Use Authorization (EUA). This EUA will remain in effect (meaning this test can be used) for the duration of the COVID-19 declaration under Section 564(b)(1) of the Act, 21 U.S.C. section 360bbb-3(b)(1), unless the authorization  is terminated or revoked.     Resp Syncytial Virus by PCR NEGATIVE NEGATIVE Final    Comment: (NOTE) Fact Sheet for Patients: BloggerCourse.com  Fact Sheet for Healthcare Providers: SeriousBroker.it  This test is not yet approved or cleared by the [place]  FDA and has been authorized for detection and/or diagnosis of SARS-CoV-2 by FDA under an Emergency Use Authorization (EUA). This EUA will remain in effect (meaning this test can be used) for the duration of the COVID-19 declaration under Section 564(b)(1) of the Act, 21 U.S.C. section 360bbb-3(b)(1), unless the authorization is terminated or revoked.  Performed at [facility], [address] [place], [place] [address]   MRSA Next Gen by PCR, Nasal     Status: Abnormal   Collection Time: 05/03/24  4:00 AM   Specimen: Nasal Mucosa; Nasal Swab  Result Value Ref Range Status   MRSA by PCR Next Gen DETECTED (A) NOT DETECTED Final    Comment: RESULT CALLED TO, READ BACK BY AND VERIFIED WITH: Salina Craver RN [phone] 05/03/24 HNM (NOTE) The GeneXpert MRSA Assay (FDA approved for NASAL specimens only), is one component of a comprehensive MRSA colonization surveillance program. It is not intended to diagnose MRSA infection nor to guide or monitor treatment for MRSA infections. Test performance is not FDA approved in patients less than 40 years old. Performed at [facility], [address] [place], [place] [address]     Coagulation Studies: Recent Labs    05/02/24 [id number]  LABPROT 17.0*  INR 1.4*    Urinalysis: Recent Labs    05/03/24 0018  COLORURINE YELLOW*  LABSPEC 1.012  PHURINE 5.0  GLUCOSEU NEGATIVE  HGBUR MODERATE*  BILIRUBINUR NEGATIVE  KETONESUR NEGATIVE  PROTEINUR >=300*  NITRITE NEGATIVE  LEUKOCYTESUR LARGE*      Imaging: US  Renal Transplant w/Doppler Result Date: 05/03/2024 CLINICAL DATA:  Acute renal insufficiency. EXAM:  ULTRASOUND OF RENAL TRANSPLANT WITH RENAL DOPPLER ULTRASOUND TECHNIQUE: Ultrasound examination of the renal transplant was performed with gray-scale, color and duplex doppler evaluation. COMPARISON:  None Available. FINDINGS: Transplant kidney location: RLQ Transplant Kidney: Renal measurements: 10.4 x 6.6 x 5.6 cm = volume: . Normal in size and parenchymal echogenicity. No evidence of mass or hydronephrosis. No peri-transplant fluid collection seen. Color flow in the main renal artery:  Yes Color flow in the  main renal vein:  Yes Duplex Doppler Evaluation: Main Renal Artery Velocity: 47 cm/sec Main Renal Artery Resistive Index: 0.8 Venous waveform in main renal vein:  Present Intrarenal resistive index in upper pole:  0.7 (normal 0.6-0.8; equivocal 0.8-0.9; abnormal >= 0.9) Intrarenal resistive index in lower pole: 0.7 (normal 0.6-0.8; equivocal 0.8-0.9; abnormal >= 0.9) Bladder: Normal for degree of bladder distention. Other findings:  Atrophic and echogenic native right kidney. IMPRESSION: Unremarkable right lower quadrant renal transplant. Electronically Signed   By: Angus Bark M.D.   On: 05/03/2024 12:59   DG Chest Port 1 View Result Date: 05/02/2024 CLINICAL DATA:  Sepsis EXAM: PORTABLE CHEST 1 VIEW COMPARISON:  None Available. FINDINGS: The heart size and mediastinal contours are within normal limits. Both lungs are clear. The visualized skeletal structures are unremarkable. IMPRESSION: No active disease. Electronically Signed   By: Tyron Gallon M.D.   On: 05/02/2024 22:01     Assessment & Plan: Rick Wang is a 68 y.o.  male with past medical conditions including diabetes, hyperlipidemia, hypertension, chronic kidney disease stage IIIa status post living donor renal transplant 2012, who was admitted to [facility] on 05/02/2024 for Lower urinary tract infectious disease [N39.0] Body aches [R52] AKI (acute kidney injury) (HCC) [N17.9] Septic shock (HCC) [A41.9, R65.21] Severe sepsis  (HCC) [A41.9, R65.20] Hypotension, unspecified hypotension type [I95.9] Sepsis, due to unspecified organism, unspecified whether acute organ dysfunction present (HCC) [A41.9]   Acute kidney injury on chronic kidney disease stage III A, status post living donor renal transplan.  (2012).  Patient currently follows with [facility] nephrology, Dr. Rosebud Confer.  Baseline creatinine appears to be 1.4-1.6.  Currently 2.73.  Acute kidney injury likely secondary to concurrent illness, septic shock.  Immunosuppression currently held, recommend holding until stable.  Would recommend dosing stress steroids.  No acute indication for dialysis.  Will continue to monitor closely.  2.  Septic shock likely secondary to E. coli bacteremia UTI.  Currently prescribed cefepime, ceftriaxone, vancomycin, and metronidazole.  Pressor support in place.  Critical care to continue management.  3. Anemia of chronic kidney disease Lab Results  Component Value Date   HGB 8.6 (L) 05/03/2024    Hemoglobin slightly decreased.  Will continue to monitor for need of ESA.  4. Secondary Hyperparathyroidism: with outpatient labs: None available Lab Results  Component Value Date   CALCIUM 8.1 (L) 05/03/2024   PHOS 2.4 (L) 05/03/2024    Will continue to monitor bone minerals.  LOS: 0 Chinyere Galiano 5/21/20251:44 PM

## 2024-05-03 NOTE — Plan of Care (Signed)
  Problem: Clinical Measurements: Goal: Respiratory complications will improve Outcome: Progressing Goal: Cardiovascular complication will be avoided Outcome: Progressing   Problem: Elimination: Goal: Will not experience complications related to bowel motility Outcome: Progressing Goal: Will not experience complications related to urinary retention Outcome: Progressing   Problem: Pain Managment: Goal: General experience of comfort will improve and/or be controlled Outcome: Progressing   Problem: Education: Goal: Knowledge of General Education information will improve Description: Including pain rating scale, medication(s)/side effects and non-pharmacologic comfort measures Outcome: Not Progressing   Problem: Health Behavior/Discharge Planning: Goal: Ability to manage health-related needs will improve Outcome: Not Progressing   Problem: Activity: Goal: Risk for activity intolerance will decrease Outcome: Not Progressing   Problem: Nutrition: Goal: Adequate nutrition will be maintained Outcome: Not Progressing

## 2024-05-03 NOTE — Progress Notes (Signed)
 Patients MAP less than 65. RN turned levo on and was titrating to map goal without change. RN noticed patient had pulled IV out. New IV placed and levo decreased to starting dose.

## 2024-05-03 NOTE — Progress Notes (Signed)
 eLink Physician-Brief Progress Note Patient Name: KAIKOA MAGRO DOB: 03-25-1956 MRN: [id number]   Date of Service  05/03/2024  HPI/Events of Note  68 year old with a PMH significant HTN, HLD, Diabetes type 1, Renal transplant 2012, CKD, DVT, Eosinophilic esophagitis with dysphagia that presented 5/20 to the ER with complaints of Fever and syncope.  Admitted with septic shock likely secondary to UTI complicated by acute on chronic kidney disease and hyperglycemia.  Vital show tachypnea but otherwise normal vitals with norepinephrine infusion.  Laboratory studies reviewed with grossly positive urinalysis.  Imaging reviewed.  eICU Interventions  Maintain broad-spectrum antibiotics, cultures pending.  Add renal ultrasound  Norepinephrine as needed to maintain MAP greater than 65, continue crystalloid infusion  Insulin  infusion for hyperglycemia  DVT prophylaxis-heparin subcutaneous GI prophylaxis not indicated    Intervention Category Evaluation Type: New Patient Evaluation  Gailene Youkhana 05/03/2024, 6:05 AM

## 2024-05-03 NOTE — Progress Notes (Signed)
 SLP Cancellation Note  Patient Details Name: Rick Wang MRN: [id number] DOB: 1956-06-27   Cancelled treatment:       Reason Eval/Treat Not Completed: Medical issues which prohibited therapy;Patient not medically ready (Per RN, hold clinical swallowing evaluation due to pt's increasing confusion. Will continue efforts as appropriate.)  [name], M.S., CCC-SLP Speech-Language Pathologist [facility] [phone] (ASCOM)  Rick Wang 05/03/2024, 1:23 PM

## 2024-05-03 NOTE — Progress Notes (Signed)
 PHARMACY - PHYSICIAN COMMUNICATION CRITICAL VALUE ALERT - BLOOD CULTURE IDENTIFICATION (BCID)  Rick Wang is an 68 y.o. male who presented to [facility] on 05/02/2024 with a chief complaint of hyperglycemia  Assessment:  2/4 GNR, e.coli  Name of physician (or Provider) Contacted: Aleskerov,Fuad  Current antibiotics: vancomycin, cefepime  Changes to prescribed antibiotics recommended:  Recommendations accepted by provider Will discontinue current abx and change to Ceftriaxone 2g IV Q24. Will await susceptibilities and de-escalate as appropriate.  Results for orders placed or performed during the hospital encounter of 05/02/24  Blood Culture ID Panel (Reflexed) (Collected: 05/02/2024  9:58 PM)  Result Value Ref Range   Enterococcus faecalis NOT DETECTED NOT DETECTED   Enterococcus Faecium NOT DETECTED NOT DETECTED   Listeria monocytogenes NOT DETECTED NOT DETECTED   Staphylococcus species NOT DETECTED NOT DETECTED   Staphylococcus aureus (BCID) NOT DETECTED NOT DETECTED   Staphylococcus epidermidis NOT DETECTED NOT DETECTED   Staphylococcus lugdunensis NOT DETECTED NOT DETECTED   Streptococcus species NOT DETECTED NOT DETECTED   Streptococcus agalactiae NOT DETECTED NOT DETECTED   Streptococcus pneumoniae NOT DETECTED NOT DETECTED   Streptococcus pyogenes NOT DETECTED NOT DETECTED   A.calcoaceticus-baumannii NOT DETECTED NOT DETECTED   Bacteroides fragilis NOT DETECTED NOT DETECTED   Enterobacterales DETECTED (A) NOT DETECTED   Enterobacter cloacae complex NOT DETECTED NOT DETECTED   Escherichia coli DETECTED (A) NOT DETECTED   Klebsiella aerogenes NOT DETECTED NOT DETECTED   Klebsiella oxytoca NOT DETECTED NOT DETECTED   Klebsiella pneumoniae NOT DETECTED NOT DETECTED   Proteus species NOT DETECTED NOT DETECTED   Salmonella species NOT DETECTED NOT DETECTED   Serratia marcescens NOT DETECTED NOT DETECTED   Haemophilus influenzae NOT DETECTED NOT DETECTED   Neisseria  meningitidis NOT DETECTED NOT DETECTED   Pseudomonas aeruginosa NOT DETECTED NOT DETECTED   Stenotrophomonas maltophilia NOT DETECTED NOT DETECTED   Candida albicans NOT DETECTED NOT DETECTED   Candida auris NOT DETECTED NOT DETECTED   Candida glabrata NOT DETECTED NOT DETECTED   Candida krusei NOT DETECTED NOT DETECTED   Candida parapsilosis NOT DETECTED NOT DETECTED   Candida tropicalis NOT DETECTED NOT DETECTED   Cryptococcus neoformans/gattii NOT DETECTED NOT DETECTED   CTX-M ESBL NOT DETECTED NOT DETECTED   Carbapenem resistance IMP NOT DETECTED NOT DETECTED   Carbapenem resistance KPC NOT DETECTED NOT DETECTED   Carbapenem resistance NDM NOT DETECTED NOT DETECTED   Carbapenem resist OXA 48 LIKE NOT DETECTED NOT DETECTED   Carbapenem resistance VIM NOT DETECTED NOT DETECTED    Delina Kruczek A [name] 05/03/2024  11:48 AM

## 2024-05-03 NOTE — Consult Note (Signed)
 PHARMACY CONSULT NOTE - ELECTROLYTES  Pharmacy Consult for Electrolyte Monitoring and Replacement   Recent Labs: Height: 5\' 10"  (177.8 cm) Weight: 67.3 kg (148 lb 5.9 oz) IBW/kg (Calculated) : 73 Estimated Creatinine Clearance: 25.7 mL/min (A) (by C-G formula based on SCr of 2.73 mg/dL (H)). Potassium (mmol/L)  Date Value  05/03/2024 3.7  04/03/2015 4.6   Magnesium (mg/dL)  Date Value  81/19/1478 1.0 (L)  04/03/2015 2.0   Calcium (mg/dL)  Date Value  29/56/2130 8.1 (L)   Calcium, Total (mg/dL)  Date Value  86/57/8469 9.7   Albumin (g/dL)  Date Value  62/95/2841 3.3 (L)  02/18/2016 4.2  12/11/2014 3.8   Phosphorus (mg/dL)  Date Value  32/44/0102 2.4 (L)  04/03/2015 3.3   Sodium (mmol/L)  Date Value  05/03/2024 137  04/03/2015 141    Assessment  Rick Wang is a 68 y.o. male presenting with septic shock. PMH significant for HTN, HLD, T1DM, CKD s/p renal transplant 2012, DVT. Pharmacy has been consulted to monitor and replace electrolytes.  Diet: NPO + sips w/ meds MIVF: D5 @ 75 mL/hr Pertinent medications: N/A  Goal of Therapy: Electrolytes WNL  Plan:  Mag 1.0: Mag sulfate 4g IV x 1 Phos 2.4: Kphos 500mg  PO x 2 Check BMP, Mg, Phos with AM labs  Thank you for allowing pharmacy to be a part of this patient's care.  Suzann Lazaro A Preciosa Bundrick, PharmD Clinical Pharmacist 05/03/2024 7:48 AM

## 2024-05-04 ENCOUNTER — Inpatient Hospital Stay

## 2024-05-04 DIAGNOSIS — B9620 Unspecified Escherichia coli [E. coli] as the cause of diseases classified elsewhere: Secondary | ICD-10-CM | POA: Diagnosis not present

## 2024-05-04 DIAGNOSIS — N179 Acute kidney failure, unspecified: Secondary | ICD-10-CM | POA: Diagnosis not present

## 2024-05-04 DIAGNOSIS — N390 Urinary tract infection, site not specified: Secondary | ICD-10-CM | POA: Diagnosis not present

## 2024-05-04 DIAGNOSIS — R7881 Bacteremia: Secondary | ICD-10-CM | POA: Diagnosis not present

## 2024-05-04 LAB — BASIC METABOLIC PANEL WITH GFR
Anion gap: 11 (ref 5–15)
Anion gap: 13 (ref 5–15)
BUN: 63 mg/dL — ABNORMAL HIGH (ref 8–23)
BUN: 64 mg/dL — ABNORMAL HIGH (ref 8–23)
CO2: 20 mmol/L — ABNORMAL LOW (ref 22–32)
CO2: 19 mmol/L — ABNORMAL LOW (ref 22–32)
Calcium: 8.5 mg/dL — ABNORMAL LOW (ref 8.9–10.3)
Calcium: 8.2 mg/dL — ABNORMAL LOW (ref 8.9–10.3)
Chloride: 108 mmol/L (ref 98–111)
Chloride: 107 mmol/L (ref 98–111)
Creatinine, Ser: 3.76 mg/dL — ABNORMAL HIGH (ref 0.61–1.24)
Creatinine, Ser: 3.77 mg/dL — ABNORMAL HIGH (ref 0.61–1.24)
GFR, Estimated: 17 mL/min — ABNORMAL LOW (ref >60)
GFR, Estimated: 17 mL/min — ABNORMAL LOW (ref >60)
Glucose, Bld: 147 mg/dL — ABNORMAL HIGH (ref 70–99)
Glucose, Bld: 124 mg/dL — ABNORMAL HIGH (ref 70–99)
Potassium: 5.6 mmol/L — ABNORMAL HIGH (ref 3.5–5.1)
Potassium: 4.4 mmol/L (ref 3.5–5.1)
Sodium: 139 mmol/L (ref 135–145)
Sodium: 139 mmol/L (ref 135–145)

## 2024-05-04 LAB — GLUCOSE, CAPILLARY
Glucose-Capillary: 116 mg/dL — ABNORMAL HIGH (ref 70–99)
Glucose-Capillary: 131 mg/dL — ABNORMAL HIGH (ref 70–99)
Glucose-Capillary: 144 mg/dL — ABNORMAL HIGH (ref 70–99)
Glucose-Capillary: 126 mg/dL — ABNORMAL HIGH (ref 70–99)
Glucose-Capillary: 128 mg/dL — ABNORMAL HIGH (ref 70–99)
Glucose-Capillary: 150 mg/dL — ABNORMAL HIGH (ref 70–99)
Glucose-Capillary: 135 mg/dL — ABNORMAL HIGH (ref 70–99)
Glucose-Capillary: 192 mg/dL — ABNORMAL HIGH (ref 70–99)
Glucose-Capillary: 208 mg/dL — ABNORMAL HIGH (ref 70–99)
Glucose-Capillary: 178 mg/dL — ABNORMAL HIGH (ref 70–99)

## 2024-05-04 LAB — HEMOGLOBIN A1C
Hgb A1c MFr Bld: 5.5 % (ref 4.8–5.6)
Mean Plasma Glucose: 111.15 mg/dL

## 2024-05-04 LAB — MAGNESIUM: Magnesium: 2.1 mg/dL (ref 1.7–2.4)

## 2024-05-04 LAB — CBC
HCT: 28.7 % — ABNORMAL LOW (ref 39.0–52.0)
Hemoglobin: 9.3 g/dL — ABNORMAL LOW (ref 13.0–17.0)
MCH: 31.3 pg (ref 26.0–34.0)
MCHC: 32.4 g/dL (ref 30.0–36.0)
MCV: 96.6 fL (ref 80.0–100.0)
Platelets: 64 10*3/uL — ABNORMAL LOW (ref 150–400)
RBC: 2.97 MIL/uL — ABNORMAL LOW (ref 4.22–5.81)
RDW: 13.7 % (ref 11.5–15.5)
WBC: 13.3 10*3/uL — ABNORMAL HIGH (ref 4.0–10.5)
nRBC: 0 % (ref 0.0–0.2)

## 2024-05-04 LAB — PHOSPHORUS: Phosphorus: 5.8 mg/dL — ABNORMAL HIGH (ref 2.5–4.6)

## 2024-05-04 LAB — LACTIC ACID, PLASMA
Lactic Acid, Venous: 2.4 mmol/L (ref 0.5–1.9)
Lactic Acid, Venous: 1.9 mmol/L (ref 0.5–1.9)

## 2024-05-04 MED ORDER — FUROSEMIDE 10 MG/ML IJ SOLN
20.0000 mg | Freq: Once | INTRAMUSCULAR | Status: AC
  Administered 2024-05-04: 20 mg via INTRAVENOUS
  Filled 2024-05-04: qty 2

## 2024-05-04 MED ORDER — INSULIN GLARGINE-YFGN 100 UNIT/ML ~~LOC~~ SOLN
15.0000 [IU] | Freq: Every day | SUBCUTANEOUS | Status: DC
  Administered 2024-05-04 – 2024-05-06 (×3): 15 [IU] via SUBCUTANEOUS
  Filled 2024-05-04 (×4): qty 0.15

## 2024-05-04 MED ORDER — DIPHENHYDRAMINE HCL 50 MG/ML IJ SOLN
50.0000 mg | Freq: Once | INTRAMUSCULAR | Status: AC
  Administered 2024-05-04: 50 mg via INTRAVENOUS
  Filled 2024-05-04: qty 1

## 2024-05-04 MED ORDER — BRINZOLAMIDE 1 % OP SUSP
1.0000 [drp] | Freq: Three times a day (TID) | OPHTHALMIC | Status: DC
  Administered 2024-05-04 – 2024-05-11 (×21): 1 [drp] via OPHTHALMIC
  Filled 2024-05-04: qty 10

## 2024-05-04 MED ORDER — BRIMONIDINE TARTRATE 0.2 % OP SOLN
1.0000 [drp] | Freq: Three times a day (TID) | OPHTHALMIC | Status: DC
  Administered 2024-05-04 – 2024-05-11 (×21): 1 [drp] via OPHTHALMIC
  Filled 2024-05-04: qty 5

## 2024-05-04 MED ORDER — INSULIN ASPART 100 UNIT/ML IJ SOLN
0.0000 [IU] | INTRAMUSCULAR | Status: DC
  Administered 2024-05-04: 2 [IU] via SUBCUTANEOUS
  Administered 2024-05-04: 3 [IU] via SUBCUTANEOUS
  Administered 2024-05-05 (×3): 2 [IU] via SUBCUTANEOUS
  Administered 2024-05-05 (×2): 1 [IU] via SUBCUTANEOUS
  Administered 2024-05-05 – 2024-05-06 (×2): 2 [IU] via SUBCUTANEOUS
  Administered 2024-05-06: 3 [IU] via SUBCUTANEOUS
  Administered 2024-05-06: 2 [IU] via SUBCUTANEOUS
  Administered 2024-05-06: 1 [IU] via SUBCUTANEOUS
  Administered 2024-05-07 (×2): 7 [IU] via SUBCUTANEOUS
  Administered 2024-05-07: 5 [IU] via SUBCUTANEOUS
  Administered 2024-05-07 – 2024-05-08 (×3): 2 [IU] via SUBCUTANEOUS
  Administered 2024-05-08: 5 [IU] via SUBCUTANEOUS
  Administered 2024-05-08: 2 [IU] via SUBCUTANEOUS
  Administered 2024-05-08: 7 [IU] via SUBCUTANEOUS
  Administered 2024-05-08 – 2024-05-09 (×2): 5 [IU] via SUBCUTANEOUS
  Administered 2024-05-09 (×3): 3 [IU] via SUBCUTANEOUS
  Administered 2024-05-09: 9 [IU] via SUBCUTANEOUS
  Administered 2024-05-10: 4 [IU] via SUBCUTANEOUS
  Administered 2024-05-10 (×2): 1 [IU] via SUBCUTANEOUS
  Administered 2024-05-10: 7 [IU] via SUBCUTANEOUS
  Administered 2024-05-10 (×2): 3 [IU] via SUBCUTANEOUS
  Administered 2024-05-11: 2 [IU] via SUBCUTANEOUS
  Administered 2024-05-11: 3 [IU] via SUBCUTANEOUS
  Administered 2024-05-11: 5 [IU] via SUBCUTANEOUS
  Administered 2024-05-11: 2 [IU] via SUBCUTANEOUS
  Filled 2024-05-04 (×38): qty 1

## 2024-05-04 NOTE — Inpatient Diabetes Management (Signed)
 Inpatient Diabetes Program Recommendations  AACE/ADA: New Consensus Statement on Inpatient Glycemic Control (2015)  Target Ranges:  Prepandial:   less than 140 mg/dL      Peak postprandial:   less than 180 mg/dL (1-2 hours)      Critically ill patients:  140 - 180 mg/dL   Lab Results  Component Value Date   GLUCAP 128 (H) 05/04/2024   HGBA1C 5.5 05/03/2024    Review of Glycemic Control  Latest Reference Range & Units 05/03/24 22:57 05/04/24 01:04 05/04/24 02:53 05/04/24 05:02 05/04/24 06:55 05/04/24 09:06  Glucose-Capillary 70 - 99 mg/dL 409 (H) 811 (H) 914 (H) 144 (H) 126 (H) 128 (H)   Diabetes history: Type 1 DM  Outpatient Diabetes medications:  Insulin  pump He is on Novolog  via the OP5: Basal rates MN = 0.6 TDD basal: 14.4 units  Bolus settings I/C: 12 ISF: 59 Target Glucose: 120 Active insulin  time: 3 hours  Current orders for Inpatient glycemic control:  IV insulin -   Inpatient Diabetes Program Recommendations:    Agree with use of IV insulin  since insulin  pump removed.  When patient is ready for transition off IV insulin , consider Semglee 15 units daily and Novolog  0-9 units q 4 hours.    Thanks,  Josefa Ni, RN, BC-ADM Inpatient Diabetes Coordinator Pager [phone]  (8a-5p)

## 2024-05-04 NOTE — Consult Note (Signed)
 PHARMACY CONSULT NOTE - ELECTROLYTES  Pharmacy Consult for Electrolyte Monitoring and Replacement   Recent Labs: Height: 5\' 10"  (177.8 cm) Weight: 63.8 kg (140 lb 10.5 oz) IBW/kg (Calculated) : 73 Estimated Creatinine Clearance: 17.7 mL/min (A) (by C-G formula based on SCr of 3.76 mg/dL (H)). Potassium (mmol/L)  Date Value  05/04/2024 5.6 (H)  04/03/2015 4.6   Magnesium (mg/dL)  Date Value  08/65/7846 2.1  04/03/2015 2.0   Calcium (mg/dL)  Date Value  96/29/5284 8.5 (L)   Calcium, Total (mg/dL)  Date Value  13/24/4010 9.7   Albumin (g/dL)  Date Value  27/25/3664 3.3 (L)  02/18/2016 4.2  12/11/2014 3.8   Phosphorus (mg/dL)  Date Value  40/34/7425 5.8 (H)  04/03/2015 3.3   Sodium (mmol/L)  Date Value  05/04/2024 139  04/03/2015 141    Assessment  Rick Wang is a 68 y.o. male presenting with septic shock. PMH significant for HTN, HLD, T1DM, CKD s/p renal transplant 2012, DVT. Pharmacy has been consulted to monitor and replace electrolytes.  Diet: NPO + sips w/ meds MIVF: N/A Pertinent medications: N/A  Goal of Therapy: Electrolytes WNL  Plan:  Nephrology following: will evaluate need for dialysis to correct abnormalities  Check BMP, Mg, Phos with AM labs  Thank you for allowing pharmacy to be a part of this patient's care.  Jacque Byron A Keiston Manley, PharmD Clinical Pharmacist 05/04/2024 7:35 AM

## 2024-05-04 NOTE — Progress Notes (Signed)
 Date of Admission:  05/02/2024    ID: Rick Wang is a 68 y.o. male Principal Problem:   Severe sepsis (HCC)    Subjective: Sitting in bed and having dinner  Medications:   brimonidine  1 drop Right Eye TID   brinzolamide  1 drop Right Eye TID   Chlorhexidine Gluconate Cloth  6 each Topical Daily   diphenhydrAMINE  50 mg Intravenous Once   heparin injection (subcutaneous)  5,000 Units Subcutaneous Q8H   hydrocortisone sod succinate (SOLU-CORTEF) inj  100 mg Intravenous Q8H   insulin  aspart  0-9 Units Subcutaneous Q4H   insulin  glargine-yfgn  15 Units Subcutaneous Daily   mupirocin ointment  1 Application Nasal BID   pantoprazole  40 mg Oral BID    Objective: Vital signs in last 24 hours: Patient Vitals for the past 24 hrs:  BP Temp Temp src Pulse Resp SpO2 Weight  05/04/24 1300 136/88 -- -- 78 18 92 % --  05/04/24 1200 (!) 163/87 98.2 F (36.8 C) Oral -- (!) 29 96 % --  05/04/24 1100 128/80 -- -- (!) 104 (!) 29 95 % --  05/04/24 1000 (!) 144/118 -- -- (!) 102 (!) 21 98 % --  05/04/24 0900 136/82 -- -- (!) 102 13 95 % --  05/04/24 0800 (!) 152/102 97.7 F (36.5 C) Oral (!) 108 (!) 24 93 % --  05/04/24 0730 (!) 160/89 -- -- (!) 108 (!) 31 94 % --  05/04/24 0700 124/84 -- -- (!) 104 11 95 % --  05/04/24 0630 125/81 -- -- (!) 112 (!) 23 96 % --  05/04/24 0615 (!) 144/83 -- -- (!) 111 (!) 22 96 % --  05/04/24 0600 (!) 157/89 -- -- (!) 116 (!) 34 94 % --  05/04/24 0545 130/72 -- -- (!) 53 (!) 21 97 % --  05/04/24 0530 138/81 -- -- (!) 106 (!) 25 92 % --  05/04/24 0515 (!) 130/91 -- -- (!) 107 (!) 23 95 % --  05/04/24 0500 (!) 178/98 -- -- (!) 111 17 95 % 63.8 kg  05/04/24 0445 (!) 134/91 -- -- (!) 110 (!) 22 90 % --  05/04/24 0430 (!) 140/69 -- -- (!) 110 (!) 38 96 % --  05/04/24 0415 123/76 -- -- (!) 103 (!) 21 97 % --  05/04/24 0400 112/74 97.8 F (36.6 C) Oral (!) 106 (!) 26 95 % --  05/04/24 0345 134/72 -- -- (!) 116 (!) 24 (!) 86 % --  05/04/24 0330 134/78 -- --  (!) 108 (!) 30 92 % --  05/04/24 0315 111/74 -- -- (!) 107 20 95 % --  05/04/24 0300 125/74 -- -- (!) 107 (!) 22 95 % --  05/04/24 0245 137/83 -- -- (!) 113 (!) 22 91 % --  05/04/24 0230 (!) 141/74 -- -- (!) 105 (!) 23 93 % --  05/04/24 0215 139/82 -- -- (!) 103 (!) 23 95 % --  05/04/24 0200 (!) 156/81 -- -- (!) 114 (!) 25 95 % --  05/04/24 0145 133/79 -- -- (!) 104 (!) 27 93 % --  05/04/24 0130 130/81 -- -- (!) 102 (!) 25 94 % --  05/04/24 0115 113/79 -- -- (!) 102 (!) 24 94 % --  05/04/24 0100 124/82 -- -- (!) 106 (!) 26 (!) 89 % --  05/04/24 0045 127/78 -- -- (!) 105 (!) 25 92 % --  05/04/24 0030 119/73 -- -- (!) 103 (!) 22  96 % --  05/04/24 0015 120/71 -- -- (!) 103 19 91 % --  05/04/24 0000 130/79 97.8 F (36.6 C) Oral (!) 104 20 93 % --  05/03/24 2345 123/78 -- -- (!) 102 (!) 25 97 % --  05/03/24 2330 119/69 -- -- (!) 104 (!) 25 91 % --  05/03/24 2315 105/72 -- -- (!) 101 (!) 22 94 % --  05/03/24 2300 132/70 -- -- (!) 106 (!) 29 94 % --  05/03/24 2245 (!) 124/109 -- -- (!) 109 (!) 30 92 % --  05/03/24 2230 110/65 -- -- (!) 103 18 94 % --  05/03/24 2215 110/74 -- -- (!) 104 (!) 25 94 % --  05/03/24 2200 118/75 -- -- (!) 107 14 98 % --  05/03/24 2145 92/66 -- -- (!) 102 (!) 23 94 % --  05/03/24 2130 95/64 -- -- (!) 103 (!) 24 94 % --  05/03/24 2115 96/62 -- -- (!) 104 (!) 21 96 % --  05/03/24 2100 112/72 -- -- (!) 105 (!) 22 96 % --  05/03/24 2045 -- -- -- (!) 103 (!) 25 95 % --  05/03/24 2044 109/70 -- -- (!) 103 (!) 22 95 % --  05/03/24 2030 114/67 -- -- (!) 105 19 98 % --  05/03/24 2015 107/71 -- -- (!) 107 (!) 25 95 % --  05/03/24 2000 90/62 98 F (36.7 C) Oral (!) 103 (!) 28 97 % --  05/03/24 1945 92/66 -- -- (!) 103 (!) 25 96 % --  05/03/24 1930 95/65 -- -- (!) 103 (!) 25 98 % --  05/03/24 1915 96/61 -- -- (!) 105 (!) 25 97 % --  05/03/24 1900 103/61 -- -- (!) 103 (!) 24 96 % --  05/03/24 1815 106/63 -- -- -- -- -- --  05/03/24 1800 (!) 98/55 -- -- (!) 108 -- 95 % --   05/03/24 1715 (!) 85/51 -- -- -- -- -- --  05/03/24 1700 (!) 83/60 -- -- (!) 104 (!) 26 99 % --  05/03/24 1645 (!) 86/55 -- -- -- -- -- --  05/03/24 1630 (!) 76/47 -- -- -- -- -- --  05/03/24 1628 (!) 68/50 -- -- -- -- -- --  05/03/24 1627 (!) 65/57 -- -- -- -- -- --  05/03/24 1626 (!) 71/45 -- -- -- -- -- --  05/03/24 1624 (!) 70/50 -- -- -- -- -- --  05/03/24 1622 (!) 69/47 -- -- -- -- -- --  05/03/24 1621 (!) 78/46 -- -- -- -- -- --  05/03/24 1620 (!) 57/41 -- -- -- -- -- --  05/03/24 1619 (!) 73/58 -- -- -- -- -- --  05/03/24 1617 (!) 66/44 -- -- -- -- -- --  05/03/24 1600 (!) 71/46 98.5 F (36.9 C) -- -- (!) 22 91 % --       PHYSICAL EXAM:  General: Alert, cooperative, no distress,nasal cannula  Lungs: b/l air entry- crepts bases Heart: Tachycardia Abdomen: Soft, non-tender,not distended. Bowel sounds normal. No masses Extremities: atraumatic, no cyanosis. No edema. No clubbing Skin: No rashes or lesions. Or bruising Lymph: Cervical, supraclavicular normal. Neurologic: Grossly non-focal  Lab Results    Latest Ref Rng & Units 05/04/2024    3:08 AM 05/03/2024    6:28 AM 05/02/2024    9:57 PM  CBC  WBC 4.0 - 10.5 K/uL 13.3  10.7  1.5   Hemoglobin 13.0 - 17.0 g/dL 9.3  8.6  64.4  Hematocrit 39.0 - 52.0 % 28.7  27.2  33.8   Platelets 150 - 400 K/uL 64  96  112        Latest Ref Rng & Units 05/04/2024    7:12 AM 05/04/2024    3:08 AM 05/03/2024    6:28 AM  CMP  Glucose 70 - 99 mg/dL 528  413  244   BUN 8 - 23 mg/dL 64  63  44   Creatinine 0.61 - 1.24 mg/dL 0.10  2.72  5.36   Sodium 135 - 145 mmol/L 139  139  137   Potassium 3.5 - 5.1 mmol/L 4.4  5.6  3.7   Chloride 98 - 111 mmol/L 107  108  107   CO2 22 - 32 mmol/L 19  20  16    Calcium 8.9 - 10.3 mg/dL 8.2  8.5  8.1       Microbiology: 5/20 both sets blood culture positive for Ecoli UC 5/21- Ecoli Studies/Results: DG Chest Port 1 View Result Date: 05/04/2024 CLINICAL DATA:  Shortness of breath. EXAM:  PORTABLE CHEST 1 VIEW COMPARISON:  May 02, 2024. FINDINGS: The heart size and mediastinal contours are within normal limits. Increased bilateral perihilar and basilar opacities are noted concerning for pulmonary edema with possible small right pleural effusion. The visualized skeletal structures are unremarkable. IMPRESSION: Increased bilateral perihilar and basilar opacities are noted concerning for pulmonary edema with possible small right pleural effusion. Electronically Signed   By: Rosalene Colon M.D.   On: 05/04/2024 14:19   US  Renal Transplant w/Doppler Result Date: 05/03/2024 CLINICAL DATA:  Acute renal insufficiency. EXAM: ULTRASOUND OF RENAL TRANSPLANT WITH RENAL DOPPLER ULTRASOUND TECHNIQUE: Ultrasound examination of the renal transplant was performed with gray-scale, color and duplex doppler evaluation. COMPARISON:  None Available. FINDINGS: Transplant kidney location: RLQ Transplant Kidney: Renal measurements: 10.4 x 6.6 x 5.6 cm = volume: . Normal in size and parenchymal echogenicity. No evidence of mass or hydronephrosis. No peri-transplant fluid collection seen. Color flow in the main renal artery:  Yes Color flow in the main renal vein:  Yes Duplex Doppler Evaluation: Main Renal Artery Velocity: 47 cm/sec Main Renal Artery Resistive Index: 0.8 Venous waveform in main renal vein:  Present Intrarenal resistive index in upper pole:  0.7 (normal 0.6-0.8; equivocal 0.8-0.9; abnormal >= 0.9) Intrarenal resistive index in lower pole: 0.7 (normal 0.6-0.8; equivocal 0.8-0.9; abnormal >= 0.9) Bladder: Normal for degree of bladder distention. Other findings:  Atrophic and echogenic native right kidney. IMPRESSION: Unremarkable right lower quadrant renal transplant. Electronically Signed   By: Angus Bark M.D.   On: 05/03/2024 12:59   DG Chest Port 1 View Result Date: 05/02/2024 CLINICAL DATA:  Sepsis EXAM: PORTABLE CHEST 1 VIEW COMPARISON:  None Available. FINDINGS: The heart size and  mediastinal contours are within normal limits. Both lungs are clear. The visualized skeletal structures are unremarkable. IMPRESSION: No active disease. Electronically Signed   By: Tyron Gallon M.D.   On: 05/02/2024 22:01     Assessment/Plan: Ecoli bacteremia with ecoli UTI- on ceftriaxone Would recommend CT abdomen to look for pyelonephritis- collection around the transplant kidney  Worsening renal function- Nephrology on board  Renal transplant Currently on steroids, agree with restarting his base line meds .  Type I DM on insulin   Discussed the management with patient an dhis wife at bed side- and his nurse

## 2024-05-04 NOTE — Progress Notes (Addendum)
 [facility]  ROUNDING NOTE   Subjective:   Patient resting in bed Family friend at bedside Alert and oriented, with delayed response 2 L nasal cannula Remains n.p.o. Insulin  drip  Objective:  Vital signs in last 24 hours:  Temp:  [97.7 F (36.5 C)-100.4 F (38 C)] 97.7 F (36.5 C) (05/22 0800) Pulse Rate:  [53-122] 102 (05/22 0900) Resp:  [11-38] 13 (05/22 0900) BP: (57-178)/(41-109) 136/82 (05/22 0900) SpO2:  [86 %-99 %] 95 % (05/22 0900) Weight:  [63.8 kg] 63.8 kg (05/22 0500)  Weight change: -1.8 kg Filed Weights   05/02/24 2139 05/03/24 0345 05/04/24 0500  Weight: 65.6 kg 67.3 kg 63.8 kg    Intake/Output: I/O last 3 completed shifts: In: 3583.3 [I.V.:2987.1; IV Piggyback:596.1] Out: 126 [Urine:125; Stool:1]   Intake/Output this shift:  Total I/O In: 76.7 [I.V.:76.7] Out: 0   Physical Exam: General: NAD  Head: Normocephalic, atraumatic. Moist oral mucosal membranes  Eyes: Anicteric  Neck: Supple  Lungs:  Clear to auscultation, normal effort  Heart: Regular rate and rhythm  Abdomen:  Soft, nontender, nondistended  Extremities: No peripheral edema.  Neurologic: Alert and oriented, moving all four extremities  Skin: No lesions  Access: None    Basic Metabolic Panel: Recent Labs  Lab 05/02/24 2157 05/03/24 0628 05/04/24 0308 05/04/24 0712  NA 142 137 139 139  K 4.0 3.7 5.6* 4.4  CL 110 107 108 107  CO2 18* 16* 20* 19*  GLUCOSE 101* 157* 147* 124*  BUN 43* 44* 63* 64*  CREATININE 2.07* 2.73* 3.76* 3.77*  CALCIUM 8.9 8.1* 8.5* 8.2*  MG  --  1.0* 2.1  --   PHOS  --  2.4* 5.8*  --     Liver Function Tests: Recent Labs  Lab 05/02/24 2157  AST 42*  ALT 25  ALKPHOS 64  BILITOT 1.3*  PROT 6.4*  ALBUMIN 3.3*   No results for input(s): "LIPASE", "AMYLASE" in the last 168 hours. No results for input(s): "AMMONIA" in the last 168 hours.  CBC: Recent Labs  Lab 05/02/24 2157 05/03/24 0628 05/04/24 0308  WBC 1.5* 10.7* 13.3*   NEUTROABS 0.9*  --   --   HGB 10.3* 8.6* 9.3*  HCT 33.8* 27.2* 28.7*  MCV 101.5* 98.6 96.6  PLT 112* 96* 64*    Cardiac Enzymes: No results for input(s): "CKTOTAL", "CKMB", "CKMBINDEX", "TROPONINI" in the last 168 hours.  BNP: Invalid input(s): "POCBNP"  CBG: Recent Labs  Lab 05/04/24 0104 05/04/24 0253 05/04/24 0502 05/04/24 0655 05/04/24 0906  GLUCAP 116* 131* 144* 126* 128*    Microbiology: Results for orders placed or performed during the hospital encounter of 05/02/24  Culture, blood (Routine x 2)     Status: None (Preliminary result)   Collection Time: 05/02/24  9:57 PM   Specimen: BLOOD  Result Value Ref Range Status   Specimen Description   Final    BLOOD BLOOD RIGHT ARM Performed at [facility], [address], [place], [place] [address]    Special Requests   Final    BOTTLES DRAWN AEROBIC AND ANAEROBIC Blood Culture adequate volume Performed at [facility], [address], [place], [place] [address]    Culture  Setup Time   Final    GRAM NEGATIVE RODS IN BOTH AEROBIC AND ANAEROBIC BOTTLES CRITICAL VALUE NOTED.  VALUE IS CONSISTENT WITH PREVIOUSLY REPORTED AND CALLED VALUE. Performed at [facility], [address], [place], [place] [address]    Culture GRAM NEGATIVE RODS  Final   Report Status PENDING  Incomplete  Culture, blood (Routine x 2)     Status: Abnormal (Preliminary result)   Collection Time: 05/02/24  9:58 PM   Specimen: BLOOD  Result Value Ref Range Status   Specimen Description   Final    BLOOD BLOOD RIGHT ARM Performed at [facility], [address], [place], [place] [address]    Special Requests   Final    BOTTLES DRAWN AEROBIC AND ANAEROBIC Blood Culture adequate volume Performed at [facility], [address], [place], [place] [address]    Culture  Setup Time   Final    GRAM NEGATIVE RODS IN BOTH AEROBIC AND ANAEROBIC BOTTLES CRITICAL RESULT CALLED TO, READ BACK BY AND  VERIFIED WITH: SHEEMA HALLAJI PHARMD 1020 05/03/24 HNM    Culture (A)  Final    ESCHERICHIA COLI SUSCEPTIBILITIES TO FOLLOW Performed at [facility], [address] [place], [place] [address]    Report Status PENDING  Incomplete  Blood Culture ID Panel (Reflexed)     Status: Abnormal   Collection Time: 05/02/24  9:58 PM  Result Value Ref Range Status   Enterococcus faecalis NOT DETECTED NOT DETECTED Final   Enterococcus Faecium NOT DETECTED NOT DETECTED Final   Listeria monocytogenes NOT DETECTED NOT DETECTED Final   Staphylococcus species NOT DETECTED NOT DETECTED Final   Staphylococcus aureus (BCID) NOT DETECTED NOT DETECTED Final   Staphylococcus epidermidis NOT DETECTED NOT DETECTED Final   Staphylococcus lugdunensis NOT DETECTED NOT DETECTED Final   Streptococcus species NOT DETECTED NOT DETECTED Final   Streptococcus agalactiae NOT DETECTED NOT DETECTED Final   Streptococcus pneumoniae NOT DETECTED NOT DETECTED Final   Streptococcus pyogenes NOT DETECTED NOT DETECTED Final   A.calcoaceticus-baumannii NOT DETECTED NOT DETECTED Final   Bacteroides fragilis NOT DETECTED NOT DETECTED Final   Enterobacterales DETECTED (A) NOT DETECTED Final    Comment: Enterobacterales represent a large order of gram negative bacteria, not a single organism. CRITICAL RESULT CALLED TO, READ BACK BY AND VERIFIED WITH: SHEEMA HALLAJI PHARMD 1020 05/03/24 HNM    Enterobacter cloacae complex NOT DETECTED NOT DETECTED Final   Escherichia coli DETECTED (A) NOT DETECTED Final    Comment: CRITICAL RESULT CALLED TO, READ BACK BY AND VERIFIED WITH: SHEEMA HALLAJI PHARMD 1020 05/03/24 HNM    Klebsiella aerogenes NOT DETECTED NOT DETECTED Final   Klebsiella oxytoca NOT DETECTED NOT DETECTED Final   Klebsiella pneumoniae NOT DETECTED NOT DETECTED Final   Proteus species NOT DETECTED NOT DETECTED Final   Salmonella species NOT DETECTED NOT DETECTED Final   Serratia marcescens NOT DETECTED NOT DETECTED  Final   Haemophilus influenzae NOT DETECTED NOT DETECTED Final   Neisseria meningitidis NOT DETECTED NOT DETECTED Final   Pseudomonas aeruginosa NOT DETECTED NOT DETECTED Final   Stenotrophomonas maltophilia NOT DETECTED NOT DETECTED Final   Candida albicans NOT DETECTED NOT DETECTED Final   Candida auris NOT DETECTED NOT DETECTED Final   Candida glabrata NOT DETECTED NOT DETECTED Final   Candida krusei NOT DETECTED NOT DETECTED Final   Candida parapsilosis NOT DETECTED NOT DETECTED Final   Candida tropicalis NOT DETECTED NOT DETECTED Final   Cryptococcus neoformans/gattii NOT DETECTED NOT DETECTED Final   CTX-M ESBL NOT DETECTED NOT DETECTED Final   Carbapenem resistance IMP NOT DETECTED NOT DETECTED Final   Carbapenem resistance KPC NOT DETECTED NOT DETECTED Final   Carbapenem resistance NDM NOT DETECTED NOT DETECTED Final   Carbapenem resist OXA 48 LIKE NOT DETECTED  NOT DETECTED Final   Carbapenem resistance VIM NOT DETECTED NOT DETECTED Final    Comment: Performed at [facility], [address] [place], [place] [address]  Resp panel by RT-PCR (RSV, Flu A&B, Covid) Anterior Nasal Swab     Status: None   Collection Time: 05/02/24 11:17 PM   Specimen: Anterior Nasal Swab  Result Value Ref Range Status   SARS Coronavirus 2 by RT PCR NEGATIVE NEGATIVE Final    Comment: (NOTE) SARS-CoV-2 target nucleic acids are NOT DETECTED.  The SARS-CoV-2 RNA is generally detectable in upper respiratory specimens during the acute phase of infection. The lowest concentration of SARS-CoV-2 viral copies this assay can detect is 138 copies/mL. A negative result does not preclude SARS-Cov-2 infection and should not be used as the sole basis for treatment or other patient management decisions. A negative result may occur with  improper specimen collection/handling, submission of specimen other than nasopharyngeal swab, presence of viral mutation(s) within the areas targeted by this assay,  and inadequate number of viral copies(<138 copies/mL). A negative result must be combined with clinical observations, patient history, and epidemiological information. The expected result is Negative.  Fact Sheet for Patients:  BloggerCourse.com  Fact Sheet for Healthcare Providers:  SeriousBroker.it  This test is no t yet approved or cleared by the [place]  FDA and  has been authorized for detection and/or diagnosis of SARS-CoV-2 by FDA under an Emergency Use Authorization (EUA). This EUA will remain  in effect (meaning this test can be used) for the duration of the COVID-19 declaration under Section 564(b)(1) of the Act, 21 U.S.C.section 360bbb-3(b)(1), unless the authorization is terminated  or revoked sooner.       Influenza A by PCR NEGATIVE NEGATIVE Final   Influenza B by PCR NEGATIVE NEGATIVE Final    Comment: (NOTE) The Xpert Xpress SARS-CoV-2/FLU/RSV plus assay is intended as an aid in the diagnosis of influenza from Nasopharyngeal swab specimens and should not be used as a sole basis for treatment. Nasal washings and aspirates are unacceptable for Xpert Xpress SARS-CoV-2/FLU/RSV testing.  Fact Sheet for Patients: BloggerCourse.com  Fact Sheet for Healthcare Providers: SeriousBroker.it  This test is not yet approved or cleared by the [place]  FDA and has been authorized for detection and/or diagnosis of SARS-CoV-2 by FDA under an Emergency Use Authorization (EUA). This EUA will remain in effect (meaning this test can be used) for the duration of the COVID-19 declaration under Section 564(b)(1) of the Act, 21 U.S.C. section 360bbb-3(b)(1), unless the authorization is terminated or revoked.     Resp Syncytial Virus by PCR NEGATIVE NEGATIVE Final    Comment: (NOTE) Fact Sheet for Patients: BloggerCourse.com  Fact Sheet for  Healthcare Providers: SeriousBroker.it  This test is not yet approved or cleared by the [place]  FDA and has been authorized for detection and/or diagnosis of SARS-CoV-2 by FDA under an Emergency Use Authorization (EUA). This EUA will remain in effect (meaning this test can be used) for the duration of the COVID-19 declaration under Section 564(b)(1) of the Act, 21 U.S.C. section 360bbb-3(b)(1), unless the authorization is terminated or revoked.  Performed at [facility], [address] [place], [place] [address]   Urine Culture     Status: Abnormal (Preliminary result)   Collection Time: 05/03/24 12:18 AM   Specimen: Urine, Random  Result Value Ref Range Status   Specimen Description   Final    URINE, RANDOM Performed at [facility], [address] [place], [place]  [address]    Special Requests   Final    NONE Reflexed from [id number] Performed at [facility], [address] [place], [place] [address]    Culture (A)  Final    >=100,000 COLONIES/mL Hillis Lu NEGATIVE RODS SUSCEPTIBILITIES TO FOLLOW Performed at [facility], [address], [place], [place] [address]    Report Status PENDING  Incomplete  MRSA Next Gen by PCR, Nasal     Status: Abnormal   Collection Time: 05/03/24  4:00 AM   Specimen: Nasal Mucosa; Nasal Swab  Result Value Ref Range Status   MRSA by PCR Next Gen DETECTED (A) NOT DETECTED Final    Comment: RESULT CALLED TO, READ BACK BY AND VERIFIED WITH: Salina Craver RN [phone] 05/03/24 HNM (NOTE) The GeneXpert MRSA Assay (FDA approved for NASAL specimens only), is one component of a comprehensive MRSA colonization surveillance program. It is not intended to diagnose MRSA infection nor to guide or monitor treatment for MRSA infections. Test performance is not FDA approved in patients less than 32 years old. Performed at [facility], [address] [place], [place] [address]      Coagulation Studies: Recent Labs    05/02/24 2156/06/04  LABPROT 17.0*  INR 1.4*    Urinalysis: Recent Labs    05/03/24 0018  COLORURINE YELLOW*  LABSPEC 1.012  PHURINE 5.0  GLUCOSEU NEGATIVE  HGBUR MODERATE*  BILIRUBINUR NEGATIVE  KETONESUR NEGATIVE  PROTEINUR >=300*  NITRITE NEGATIVE  LEUKOCYTESUR LARGE*      Imaging: US  Renal Transplant w/Doppler Result Date: 05/03/2024 CLINICAL DATA:  Acute renal insufficiency. EXAM: ULTRASOUND OF RENAL TRANSPLANT WITH RENAL DOPPLER ULTRASOUND TECHNIQUE: Ultrasound examination of the renal transplant was performed with gray-scale, color and duplex doppler evaluation. COMPARISON:  None Available. FINDINGS: Transplant kidney location: RLQ Transplant Kidney: Renal measurements: 10.4 x 6.6 x 5.6 cm = volume: . Normal in size and parenchymal echogenicity. No evidence of mass or hydronephrosis. No peri-transplant fluid collection seen. Color flow in the main renal artery:  Yes Color flow in the main renal vein:  Yes Duplex Doppler Evaluation: Main Renal Artery Velocity: 47 cm/sec Main Renal Artery Resistive Index: 0.8 Venous waveform in main renal vein:  Present Intrarenal resistive index in upper pole:  0.7 (normal 0.6-0.8; equivocal 0.8-0.9; abnormal >= 0.9) Intrarenal resistive index in lower pole: 0.7 (normal 0.6-0.8; equivocal 0.8-0.9; abnormal >= 0.9) Bladder: Normal for degree of bladder distention. Other findings:  Atrophic and echogenic native right kidney. IMPRESSION: Unremarkable right lower quadrant renal transplant. Electronically Signed   By: Angus Bark M.D.   On: 05/03/2024 12:59   DG Chest Port 1 View Result Date: 05/02/2024 CLINICAL DATA:  Sepsis EXAM: PORTABLE CHEST 1 VIEW COMPARISON:  None Available. FINDINGS: The heart size and mediastinal contours are within normal limits. Both lungs are clear. The visualized skeletal structures are unremarkable. IMPRESSION: No active disease. Electronically Signed   By: Tyron Gallon M.D.    On: 05/02/2024 22:01     Medications:    cefTRIAXone (ROCEPHIN)  IV Stopped (05/03/24 1249)   insulin  0.7 Units/hr (05/04/24 0800)   norepinephrine (LEVOPHED) Adult infusion Stopped (05/04/24 0204)    Chlorhexidine Gluconate Cloth  6 each Topical Daily   diphenhydrAMINE  50 mg Intravenous Once   heparin injection (subcutaneous)  5,000 Units Subcutaneous Q8H   hydrocortisone sod succinate (SOLU-CORTEF) inj  100 mg Intravenous Q8H   mupirocin ointment  1 Application Nasal BID   pantoprazole  40 mg Oral BID  acetaminophen, docusate sodium, fentaNYL (SUBLIMAZE) injection, ondansetron (ZOFRAN) IV, mouth rinse, polyethylene glycol  Assessment/ Plan:  Mr. ADAIR LAUDERBACK is a 68 y.o.  male with past medical conditions including diabetes, hyperlipidemia, hypertension, chronic kidney disease stage IIIa status post living donor renal transplant 2012, who was admitted to [facility] on 05/02/2024 for Lower urinary tract infectious disease [N39.0] Body aches [R52] AKI (acute kidney injury) (HCC) [N17.9] Septic shock (HCC) [A41.9, R65.21] Severe sepsis (HCC) [A41.9, R65.20] Hypotension, unspecified hypotension type [I95.9] Sepsis, due to unspecified organism, unspecified whether acute organ dysfunction present [facility]) [A41.9]   Acute kidney injury on chronic kidney disease stage III A, status post living donor renal transplant (2012).  Patient currently follows with [facility] nephrology, Dr. Rosebud Confer.  Baseline creatinine appears to be 1.4-1.6.  Currently 2.73.  Acute kidney injury likely secondary to concurrent illness, septic shock.  Immunosuppression currently held, may consider restarting tomorrow.  Continue steroids at this time. No acute indication for dialysis.  Will continue to monitor closely.   Lab Results  Component Value Date   CREATININE 3.77 (H) 05/04/2024   CREATININE 3.76 (H) 05/04/2024   CREATININE 2.73 (H) 05/03/2024    Intake/Output Summary (Last 24 hours) at 05/04/2024 1041 Last data filed  at 05/04/2024 0800 Gross per 24 hour  Intake 1930.14 ml  Output 125 ml  Net 1805.14 ml    2.  Septic shock likely secondary to E. coli bacteremia UTI.  Received cefepime, ceftriaxone, vancomycin, and metronidazole.  Now prescribed ceftriaxone only.  Pressors have been weaned.  ID has been consulted.  Critical care to continue management.   3. Anemia of chronic kidney disease Lab Results  Component Value Date   HGB 9.3 (L) 05/04/2024  Hemoglobin within optimal range.  No need for ESA's at this time.  4. Secondary Hyperparathyroidism: with outpatient labs: None available  Lab Results  Component Value Date   CALCIUM 8.2 (L) 05/04/2024   PHOS 5.8 (H) 05/04/2024    Calcium acceptable, while phosphorus slightly elevated.  Will continue to monitor bone minerals at this time.   LOS: 1 Danamarie Minami 5/22/202510:41 AM

## 2024-05-04 NOTE — Evaluation (Addendum)
 Clinical/Bedside Swallow Evaluation Patient Details  Name: Rick Wang MRN: [id number] Date of Birth: Feb 04, 1956  Today's Date: 05/04/2024 Time: SLP Start Time (ACUTE ONLY): 1150 SLP Stop Time (ACUTE ONLY): 1208 SLP Time Calculation (min) (ACUTE ONLY): 18 min  Past Medical History:  Past Medical History:  Diagnosis Date   Chronic kidney disease    Bilateral kidney failure. Rcvd new RIGHT kidney.   Diabetes mellitus without complication (HCC)    Heart murmur    followed by PCP   Hypercholesteremia    Hypertension    Past Surgical History:  Past Surgical History:  Procedure Laterality Date   CATARACT EXTRACTION     COLONOSCOPY WITH PROPOFOL  N/A 04/06/2016   Procedure: COLONOSCOPY WITH PROPOFOL ;  Surgeon: Marnee Sink, MD;  Location: [facility];  Service: Endoscopy;  Laterality: N/A;   HERNIA REPAIR  08/01/10   umbilical   PERITONEAL CATHETER INSERTION     TRANSPLANTATION RENAL Right 09/01/11   HPI:  Per H&P, "Rick Wang is 68 year old with a PMH significant HTN, HLD, Diabetes type 1, Renal transplant 2012, CKD, DVT, Eosinophilic esophagitis with dysphagia that presented 5/20 to the ER with complaints of Fever and syncope. To review, patient received Hep B vaccine Monday, on Tuesday developed Malaise, poor appetite, fever, nausea, confusion. Wife states that he was standing and his legs gave out, she assisted him to the ground. EMS was called, upon arrival to the ER BP 113/92, HR 114, Temp 102.6. Labs revealed chronic Leukopenia 1.5, hgb 10.3, Plts 112, BUN 43, creat 2.07, Lactic acid 5.2, PT 17, INR 1.4, Beta hydroxybutyrate negative, RVP negative. UA+, culture pending, BC x 2, and he was started on Cefepime and Vancomycin. He was given 30 ml/kg IVF bolus. With ongoing hypotension he was given and additional 1 liter (3 Liters total) and started on Levophed infusion. He is now admitted to the ICU for further management of his septic shock." CXR on admit, negative.   Assessment  / Plan / Recommendation  Clinical Impression  Pt seen for clinical swallowing evaluation. Pt presents with s/sx mild oral dysphagia c/b mildly prolonged mastication and trace-mild lingual stasis with solids. Suspect oral deficits exacerbated by xerostomia. Pharyngeal swallow appeared [facility] per clinical assessment. Pt with known hx of solid dysphagia "for years" per pt. Pt endorsed particular difficulty with dry, particulate solids. Recommend initiation of a mech soft diet with thin liquids with standard aspiration and reflux precautions. SLP to f/u x1 for diet tolerance given AMS, current respiratory status, and comorbidities. SLP Visit Diagnosis: Dysphagia, oral phase (R13.11);Dysphagia, pharyngoesophageal phase (R13.14)    Aspiration Risk  Mild aspiration risk    Diet Recommendation Dysphagia 3 (Mech soft);Thin liquid    Liquid Administration via: Spoon;Cup Medication Administration:  (as tolerated) Supervision: Patient able to self feed (set up) Compensations: Minimize environmental distractions;Slow rate;Small sips/bites;Follow solids with liquid Postural Changes: Seated upright at 90 degrees (upright 60-90 minutes after meals)    Other  Recommendations Oral Care Recommendations: Oral care QID    Recommendations for follow up therapy are one component of a multi-disciplinary discharge planning process, led by the attending physician.  Recommendations may be updated based on patient status, additional functional criteria and insurance authorization.  Follow up Recommendations No SLP follow up         Functional Status Assessment Patient has had a recent decline in their functional status and demonstrates the ability to make significant improvements in function in a reasonable and predictable amount of time.  Frequency  and Duration min 1 x/week  1 week       Prognosis Prognosis for improved oropharyngeal function: Good Barriers to Reach Goals:  (comorbidities)      Swallow Study    General Date of Onset: 05/02/24 HPI: Per H&P, "Rick Wang is 68 year old with a PMH significant HTN, HLD, Diabetes type 1, Renal transplant 2012, CKD, DVT, Eosinophilic esophagitis with dysphagia that presented 5/20 to the ER with complaints of Fever and syncope. To review, patient received Hep B vaccine Monday, on Tuesday developed Malaise, poor appetite, fever, nausea, confusion. Wife states that he was standing and his legs gave out, she assisted him to the ground. EMS was called, upon arrival to the ER BP 113/92, HR 114, Temp 102.6. Labs revealed chronic Leukopenia 1.5, hgb 10.3, Plts 112, BUN 43, creat 2.07, Lactic acid 5.2, PT 17, INR 1.4, Beta hydroxybutyrate negative, RVP negative. UA+, culture pending, BC x 2, and he was started on Cefepime and Vancomycin. He was given 30 ml/kg IVF bolus. With ongoing hypotension he was given and additional 1 liter (3 Liters total) and started on Levophed infusion. He is now admitted to the ICU for further management of his septic shock." Type of Study: Bedside Swallow Evaluation Previous Swallow Assessment: pt with hx of solid food dysphagia Diet Prior to this Study: NPO Temperature Spikes Noted: Yes Respiratory Status: Nasal cannula History of Recent Intubation: No Behavior/Cognition: Alert;Cooperative;Pleasant mood Oral Cavity Assessment: Dry Oral Care Completed by SLP: Yes Oral Cavity - Dentition: Adequate natural dentition Vision: Functional for self-feeding Self-Feeding Abilities: Able to feed self;Needs set up Patient Positioning: Upright in bed Baseline Vocal Quality: Normal Volitional Cough: Strong Volitional Swallow: Able to elicit    Oral/Motor/Sensory Function Overall Oral Motor/Sensory Function: Within functional limits   Ice Chips Ice chips: Within functional limits Presentation: Spoon   Thin Liquid Thin Liquid: Within functional limits Presentation: Straw    Nectar Thick Nectar Thick Liquid: Not tested   Honey Thick Honey Thick  Liquid: Not tested   Puree Puree: Within functional limits Presentation: Self Fed   Solid     Solid: Impaired Oral Phase Impairments: Impaired mastication Oral Phase Functional Implications: Impaired mastication;Oral residue Pharyngeal Phase Impairments:  (WFL)     [name], M.S., CCC-SLP Speech-Language Pathologist [facility] [phone] (ASCOM)  Leory Rands Angelis Gates 05/04/2024,1:36 PM

## 2024-05-04 NOTE — Progress Notes (Signed)
 NAME:  Rick Wang, MRN:  [id number], DOB:  1956/05/11, LOS: 1 ADMISSION DATE:  05/02/2024, CONSULTATION DATE:  05/03/24 REFERRING MD:  Meredith Stalls CHIEF COMPLAINT:   Fever and Syncope  History of Present Illness:  Rick Wang is 68 year old with a PMH significant HTN, HLD, Diabetes type 1, Renal transplant 2012, CKD, DVT, Eosinophilic esophagitis with dysphagia that presented 5/20 to the ER with complaints of Fever and syncope. To review, patient received Hep B vaccine Monday, on Tuesday developed Malaise, poor appetite, fever, nausea, confusion. Wife states that he was standing and his legs gave out, she assisted him to the ground. EMS was called, upon arrival to the ER BP 113/92, HR 114, Temp 102.6. Labs revealed chronic Leukopenia 1.5, hgb 10.3, Plts 112, BUN 43, creat 2.07, Lactic acid 5.2, PT 17, INR 1.4, Beta hydroxybutyrate negative, RVP negative. UA+, culture pending, BC x 2, and he was started on Cefepime and Vancomycin. He was given 30 ml/kg IVF bolus. With ongoing hypotension he was given and additional 1 liter (3 Liters total) and started on Levophed infusion. He is now admitted to the ICU for further management of his septic shock.     Pertinent  Medical History  HTN, HLD, Diabetes type 1, Renal transplant 2012, CKD, DVT, Eosinophilic esophagitis with dysphagia  Significant Hospital Events: Including procedures, antibiotic start and stop dates in addition to other pertinent events   5/21: Admitted to ICU with septic shock due to UTI. BC x 2, RVP negative, Urine culture pending, Cefepime + Vancomycin. S/p 3 Liters IVF. Starting insulin  drip and discontinuing patients pump.  05/04/24-patient had insomnia overnight, on 2L/min [place] [employer].  S/p ID eval.  Remains oliguric and has been seen by nephrology. Has dysphagia and is for SLP.  Remains off vasopressors.   Interim History / Subjective:   Objective    Blood pressure (!) 152/102, pulse (!) 108, temperature 97.7 F (36.5 C), temperature  source Oral, resp. rate (!) 24, height 5\' 10"  (1.778 m), weight 63.8 kg, SpO2 93%.        Intake/Output Summary (Last 24 hours) at 05/04/2024 0835 Last data filed at 05/04/2024 0800 Gross per 24 hour  Intake 2209.39 ml  Output 126 ml  Net 2083.39 ml   Filed Weights   05/02/24 2139 05/03/24 0345 05/04/24 0500  Weight: 65.6 kg 67.3 kg 63.8 kg    Review of systems   10 point ROS done and is negative except for hunger otherwise as per subjective findings Assessment and Plan   #Septic Shock secondary to UTI #Immunocompromised, chronic #Lactic Acidosis #Hypotension - Daily CBC - Cultures: F/u BC x 2 and urine cultures, trend lactic until clears, RVP negative - Antibiotics: Cefepime + Vancomycin - s/p 3 liters IVPB, continue IVF @ 150 ml/hr - Levophed infusion, titrate for MAP > 65 - Strict I/O's: alert provider if UOP < 0.5 mL/kg/hr - Persistent hypotension consider stress dose steroids    Acute on Chronic Kidney Disease  Renal Transplant 2012 @ [facility] - Reach out to Transplant team in AM - Daily BMP - Avoid nephrotoxic agents - Continue home Mycophenolate and Tacrolimus  pending med reconciliation  - Fluid resuscitation  - Renal US   Diabetes Type 1, utilizes insulin  pump - Remove Insulin  pump and start Endotool - Glucose checks per Endotool - Consider endocrine consult  #Hypertension #Hyperlipidemia - Hold home antihypertensives - Continue home statin pending med rec  #Thrombocytosis #Anemia Hgb 10.3, Plts 112,  PT 17, INR 1.4 on admission -  Monitor for bleeding - Daily CBC  #Eosinophilic Esophagitis w/ Dysphagia Patient is s/p recent EGD with biopsy's - Continue home PPI - NPO pending SLP evaluation  Best Practice (right click and "Reselect all SmartList Selections" daily)   Diet/type: NPO w/ oral meds DVT prophylaxis SCD, SQH (monitor with Thrombocytopenia) Pressure ulcer(s): N/A GI prophylaxis: PPI Lines: N/A Foley:  N/A Code Status:  full code per  patient and spouse Last date of multidisciplinary goals of care discussion [All care and plans explained to patient and wife on admission, questions answered]  Labs   CBC: Recent Labs  Lab 05/02/24 2157 05/03/24 0628 05/04/24 0308  WBC 1.5* 10.7* 13.3*  NEUTROABS 0.9*  --   --   HGB 10.3* 8.6* 9.3*  HCT 33.8* 27.2* 28.7*  MCV 101.5* 98.6 96.6  PLT 112* 96* 64*   Basic Metabolic Panel: Recent Labs  Lab 05/02/24 2157 05/03/24 0628 05/04/24 0308 05/04/24 0712  NA 142 137 139 139  K 4.0 3.7 5.6* 4.4  CL 110 107 108 107  CO2 18* 16* 20* 19*  GLUCOSE 101* 157* 147* 124*  BUN 43* 44* 63* 64*  CREATININE 2.07* 2.73* 3.76* 3.77*  CALCIUM 8.9 8.1* 8.5* 8.2*  MG  --  1.0* 2.1  --   PHOS  --  2.4* 5.8*  --    GFR: Estimated Creatinine Clearance: 17.6 mL/min (A) (by C-G formula based on SCr of 3.77 mg/dL (H)). Recent Labs  Lab 05/02/24 2157 05/02/24 2336 05/03/24 0259 05/03/24 0628 05/03/24 0846 05/04/24 0308 05/04/24 0712  WBC 1.5*  --   --  10.7*  --  13.3*  --   LATICACIDVEN 5.2*   < > 5.5* 5.1* 5.7*  --  2.4*   < > = values in this interval not displayed.   Liver Function Tests: Recent Labs  Lab 05/02/24 2157  AST 42*  ALT 25  ALKPHOS 64  BILITOT 1.3*  PROT 6.4*  ALBUMIN 3.3*   Coagulation Profile: Recent Labs  Lab 05/02/24 2157  INR 1.4*   HbA1C: Hemoglobin A1C  Date/Time Value Ref Range Status  11/26/2016 12:00 AM 6.5  Final  04/03/2015 08:36 AM 6.2 (H) % Final    Comment:    4.0-6.0 NOTE: New Reference Range  02/19/15    Hgb A1c MFr Bld  Date/Time Value Ref Range Status  05/03/2024 06:28 AM 5.5 4.8 - 5.6 % Final    Comment:    (NOTE) Pre diabetes:          5.7%-6.4%  Diabetes:              >6.4%  Glycemic control for   <7.0% adults with diabetes    CBG: Recent Labs  Lab 05/03/24 2257 05/04/24 0104 05/04/24 0253 05/04/24 0502 05/04/24 0655  GLUCAP 136* 116* 131* 144* 126*   Review of Systems:   Denies CP, shortness of  breath, abdominal pain, nausea. Has no complaints  Past Medical History:  He,  has a past medical history of Chronic kidney disease, Diabetes mellitus without complication (HCC), Heart murmur, Hypercholesteremia, and Hypertension.   Surgical History:   Past Surgical History:  Procedure Laterality Date   CATARACT EXTRACTION     COLONOSCOPY WITH PROPOFOL  N/A 04/06/2016   Procedure: COLONOSCOPY WITH PROPOFOL ;  Surgeon: Marnee Sink, MD;  Location: [facility];  Service: Endoscopy;  Laterality: N/A;   HERNIA REPAIR  08/01/10   umbilical   PERITONEAL CATHETER INSERTION     TRANSPLANTATION RENAL Right 09/01/11  Social History:   reports that he has never smoked. He has never used smokeless tobacco. He reports that he does not drink alcohol and does not use drugs.   Family History:  His family history includes Congestive Heart Failure in his mother; Diabetes in his father and mother; Kidney disease in his father; Prostate cancer in his father.   Allergies No Known Allergies   Home Medications  Prior to Admission medications   Medication Sig Start Date End Date Taking? Authorizing Provider  amoxicillin -clavulanate (AUGMENTIN ) 875-125 MG tablet Take 1 tablet by mouth 2 (two) times daily. 11/25/22   Normie Becton, FNP  apixaban (ELIQUIS) 2.5 MG TABS tablet Take by mouth.    [provider]  aspirin 81 MG tablet Take by mouth. 08/08/13   [provider]  atorvastatin (LIPITOR) 20 MG tablet Take by mouth. 08/08/13   [provider]  Brinzolamide-Brimonidine [facility]) 1-0.2 % SUSP Apply to eye. 08/08/13   [provider]  docusate sodium (COLACE) 100 MG capsule Take by mouth. 08/08/13   [provider]  ferrous sulfate 325 (65 FE) MG tablet Take by mouth. 08/08/13   [provider]  furosemide (LASIX) 20 MG tablet Take 20 mg by mouth. 07/16/15 07/15/16  [provider]  insulin  aspart (NOVOLOG ) 100 UNIT/ML injection INJECT 7  UNITS UNDER THE SKIN BEFORE BREAKFAST, 5 UNITS BEFORE LUNCH AND 4 UNITS BEFORE DINNER 04/03/17   Lamon Pillow, MD  Insulin  Disposable Pump (OMNIPOD 5 G6 INTRO, GEN 5,) KIT Inject into the skin. 09/26/21   [provider]  Insulin  Glargine (TOUJEO SOLOSTAR) 300 UNIT/ML SOPN Inject 16 Units into the skin daily.     [provider]  Insulin  Pen Needle (BD PEN NEEDLE NANO U/F) 32G X 4 MM MISC USE 1 EACH ONCE DAILY 12/18/20   [provider]  Insulin  Syringe-Needle U-100 (BD INSULIN  SYRINGE ULTRAFINE) 31G X 5/16" 0.5 ML MISC  10/09/13   [provider]  lisinopril (PRINIVIL,ZESTRIL) 40 MG tablet Take 40 mg by mouth. 08/15/15 08/14/16  [provider]  mycophenolate (MYFORTIC) 180 MG EC tablet Take by mouth. 08/08/13   [provider]  NOVOLOG  100 UNIT/ML injection INJECT 7 UNITS UNDER THE SKIN BEFORE BREAKFAST, 5 UNITS BEFORE LUNCH AND 4 UNITS BEFORE DINNER 04/03/19   Nikki Barters, MD  [facility] LANCETS FINE MISC  10/09/13   [provider]  Polyethylene Glycol 3350 GRAN Take by mouth. 08/08/13   [provider]  SENSIPAR 60 MG tablet  05/20/17   [provider]  tacrolimus  (PROGRAF ) 1 MG capsule  05/10/15   [provider]    Critical care provider statement:   Total critical care time: 33 minutes   Performed by: Jaclynn Mast MD   Critical care time was exclusive of separately billable procedures and treating other patients.   Critical care was necessary to treat or prevent imminent or life-threatening deterioration.   Critical care was time spent personally by me on the following activities: development of treatment plan with patient and/or surrogate as well as nursing, discussions with consultants, evaluation of patient's response to treatment, examination of patient, obtaining history from patient or surrogate, ordering and performing treatments and interventions, ordering and review of laboratory studies,  ordering and review of radiographic studies, pulse oximetry and re-evaluation of patient's condition.    Marquavius Scaife, M.D.  Pulmonary & Critical Care Medicine

## 2024-05-05 ENCOUNTER — Inpatient Hospital Stay

## 2024-05-05 DIAGNOSIS — R6520 Severe sepsis without septic shock: Secondary | ICD-10-CM

## 2024-05-05 DIAGNOSIS — R7881 Bacteremia: Secondary | ICD-10-CM | POA: Diagnosis not present

## 2024-05-05 DIAGNOSIS — B9620 Unspecified Escherichia coli [E. coli] as the cause of diseases classified elsewhere: Secondary | ICD-10-CM

## 2024-05-05 DIAGNOSIS — N179 Acute kidney failure, unspecified: Secondary | ICD-10-CM

## 2024-05-05 DIAGNOSIS — N390 Urinary tract infection, site not specified: Secondary | ICD-10-CM | POA: Diagnosis not present

## 2024-05-05 DIAGNOSIS — A419 Sepsis, unspecified organism: Secondary | ICD-10-CM | POA: Diagnosis not present

## 2024-05-05 LAB — GLUCOSE, CAPILLARY
Glucose-Capillary: 167 mg/dL — ABNORMAL HIGH (ref 70–99)
Glucose-Capillary: 143 mg/dL — ABNORMAL HIGH (ref 70–99)
Glucose-Capillary: 177 mg/dL — ABNORMAL HIGH (ref 70–99)
Glucose-Capillary: 148 mg/dL — ABNORMAL HIGH (ref 70–99)
Glucose-Capillary: 155 mg/dL — ABNORMAL HIGH (ref 70–99)
Glucose-Capillary: 112 mg/dL — ABNORMAL HIGH (ref 70–99)

## 2024-05-05 LAB — CULTURE, BLOOD (ROUTINE X 2)
Special Requests: ADEQUATE
Special Requests: ADEQUATE

## 2024-05-05 LAB — CBC
HCT: 29.4 % — ABNORMAL LOW (ref 39.0–52.0)
Hemoglobin: 9.6 g/dL — ABNORMAL LOW (ref 13.0–17.0)
MCH: 31.1 pg (ref 26.0–34.0)
MCHC: 32.7 g/dL (ref 30.0–36.0)
MCV: 95.1 fL (ref 80.0–100.0)
Platelets: 57 10*3/uL — ABNORMAL LOW (ref 150–400)
RBC: 3.09 MIL/uL — ABNORMAL LOW (ref 4.22–5.81)
RDW: 13.8 % (ref 11.5–15.5)
WBC: 17.3 10*3/uL — ABNORMAL HIGH (ref 4.0–10.5)
nRBC: 0 % (ref 0.0–0.2)

## 2024-05-05 LAB — PROCALCITONIN: Procalcitonin: 56.1 ng/mL

## 2024-05-05 LAB — BASIC METABOLIC PANEL WITH GFR
Anion gap: 9 (ref 5–15)
BUN: 84 mg/dL — ABNORMAL HIGH (ref 8–23)
CO2: 21 mmol/L — ABNORMAL LOW (ref 22–32)
Calcium: 8.2 mg/dL — ABNORMAL LOW (ref 8.9–10.3)
Chloride: 108 mmol/L (ref 98–111)
Creatinine, Ser: 3.9 mg/dL — ABNORMAL HIGH (ref 0.61–1.24)
GFR, Estimated: 16 mL/min — ABNORMAL LOW (ref >60)
Glucose, Bld: 188 mg/dL — ABNORMAL HIGH (ref 70–99)
Potassium: 4.5 mmol/L (ref 3.5–5.1)
Sodium: 138 mmol/L (ref 135–145)

## 2024-05-05 LAB — URINE CULTURE: Culture: >=100000 — AB

## 2024-05-05 LAB — PHOSPHORUS: Phosphorus: 5.8 mg/dL — ABNORMAL HIGH (ref 2.5–4.6)

## 2024-05-05 LAB — MAGNESIUM: Magnesium: 2.3 mg/dL (ref 1.7–2.4)

## 2024-05-05 MED ORDER — METOPROLOL TARTRATE 25 MG PO TABS
25.0000 mg | ORAL_TABLET | Freq: Two times a day (BID) | ORAL | Status: DC
  Administered 2024-05-05 – 2024-05-11 (×11): 25 mg via ORAL
  Filled 2024-05-05 (×11): qty 1

## 2024-05-05 MED ORDER — TACROLIMUS 0.5 MG PO CAPS
0.5000 mg | ORAL_CAPSULE | Freq: Two times a day (BID) | ORAL | Status: DC
  Administered 2024-05-05: 0.5 mg via ORAL
  Filled 2024-05-05: qty 1

## 2024-05-05 MED ORDER — TRAZODONE HCL 50 MG PO TABS
50.0000 mg | ORAL_TABLET | Freq: Every evening | ORAL | Status: DC | PRN
  Administered 2024-05-06 – 2024-05-10 (×4): 50 mg via ORAL
  Filled 2024-05-05 (×4): qty 1

## 2024-05-05 MED ORDER — TACROLIMUS 1 MG PO CAPS
1.0000 mg | ORAL_CAPSULE | Freq: Every day | ORAL | Status: DC
  Administered 2024-05-06 – 2024-05-08 (×3): 1 mg via ORAL
  Filled 2024-05-05 (×4): qty 1

## 2024-05-05 MED ORDER — TACROLIMUS 0.5 MG PO CAPS
0.5000 mg | ORAL_CAPSULE | Freq: Every day | ORAL | Status: DC
  Administered 2024-05-05 – 2024-05-08 (×4): 0.5 mg via ORAL
  Filled 2024-05-05 (×4): qty 1

## 2024-05-05 MED ORDER — IOHEXOL 9 MG/ML PO SOLN
500.0000 mL | ORAL | Status: AC
  Administered 2024-05-05 (×2): 500 mL via ORAL

## 2024-05-05 MED ORDER — TAMSULOSIN HCL 0.4 MG PO CAPS
0.4000 mg | ORAL_CAPSULE | Freq: Every day | ORAL | Status: DC
  Administered 2024-05-05 – 2024-05-10 (×6): 0.4 mg via ORAL
  Filled 2024-05-05 (×6): qty 1

## 2024-05-05 NOTE — Progress Notes (Signed)
     [facility] REFERRAL        Occupational Therapy * Physical Therapy * Speech Therapy                           DATE 05/05/24   PATIENT NAME Rick Wang PATIENT MRN [id number]       DIAGNOSIS/DIAGNOSIS CODE Severe sepsis  DATE OF DISCHARGE: Pending       PRIMARY CARE PHYSICIAN     Nikki Barters, MD     General - Family Medicine    [phone]         Dear Provider (Name: [facility] outpatient __  Fax: [phone]   I certify that I have examined this patient and that occupational/physical/speech therapy is necessary on an outpatient basis.    The patient has expressed interest in completing their recommended course of therapy at your  location.  Once a formal order from the patient's primary care physician has been obtained, please  contact him/her to schedule an appointment for evaluation at your earliest convenience.   [ x]  Physical Therapy Evaluate and Treat  [  ]  Occupational Therapy Evaluate and Treat  [  ]  Speech Therapy Evaluate and Treat         The patient's primary care physician (listed above) must furnish and be responsible for a formal order such that the recommended services may be furnished while under the primary physician's care, and that the plan of care will be established and reviewed every 30 days (or more often if condition necessitates).

## 2024-05-05 NOTE — Care Management Important Message (Signed)
 Important Message  Patient Details  Name: Rick Wang MRN: [id number] Date of Birth: [birth year]   Important Message Given:  Yes - Medicare IM     Anise Kerns 05/05/2024, 2:36 PM

## 2024-05-05 NOTE — Evaluation (Signed)
 Physical Therapy Evaluation Patient Details Name: Rick Wang MRN: [id number] DOB: 02-09-1956 Today's Date: 05/05/2024  History of Present Illness  68 y/o male presented to ED on 05/02/24 after being found on floor by EMS. Admitted for septic shock due to UTI. PMH: T1DM, hx of kidney transplant, CKD, DVT, HTN, esophagitis with dysphagia.  Clinical Impression  Patient admitted with the above. PTA, patient lives with wife and was independent with mobility with no AD. Patient reporting that he has baseline peripheral vision and depth perception deficits. Patient demonstrates weakness, impaired balance, decreased safety awareness, and decreased activity tolerance. General confusion noted during conversations. Requires 2L O2 for mobility to maintain spO2 >88%. SpO2 dropping as low as 83% on RA with short mobility. Required up to modA during hallway ambulation to recover due to LOB x 3-4 while in open busy environment. Patient will benefit from skilled PT services during acute stay to address listed deficits. Patient will benefit from ongoing therapy at discharge to maximize functional independence and safety.        If plan is discharge home, recommend the following: A little help with walking and/or transfers;A little help with bathing/dressing/bathroom;Assistance with cooking/housework;Direct supervision/assist for medications management;Direct supervision/assist for financial management;Assist for transportation;Help with stairs or ramp for entrance;Supervision due to cognitive status   Can travel by private vehicle        Equipment Recommendations Other (comment) (TBD)  Recommendations for Other Services       Functional Status Assessment Patient has had a recent decline in their functional status and demonstrates the ability to make significant improvements in function in a reasonable and predictable amount of time.     Precautions / Restrictions Precautions Precautions:  Fall Restrictions Weight Bearing Restrictions Per Provider Order: No      Mobility  Bed Mobility Overal bed mobility: Needs Assistance Bed Mobility: Supine to Sit, Sit to Supine     Supine to sit: Supervision Sit to supine: Supervision   General bed mobility comments: min cues for technique    Transfers Overall transfer level: Needs assistance Equipment used: None Transfers: Sit to/from Stand Sit to Stand: Contact guard assist                Ambulation/Gait Ambulation/Gait assistance: Min assist, +2 safety/equipment, +2 physical assistance, Mod assist Gait Distance (Feet): 200 Feet Assistive device: None Gait Pattern/deviations: Step-through pattern, Decreased stride length, Staggering left, Staggering right, Drifts right/left Gait velocity: decreased     General Gait Details: minA+2 for ambulation throughout with patient staggering L/R especially with open busy environment in hallway requiring up to modA to recover LOB x 3-4 during mobility  Stairs            Wheelchair Mobility     Tilt Bed    Modified Rankin (Stroke Patients Only)       Balance Overall balance assessment: Needs assistance Sitting-balance support: No upper extremity supported, Feet supported Sitting balance-Leahy Scale: Good     Standing balance support: Single extremity supported, During functional activity Standing balance-Leahy Scale: Poor                               Pertinent Vitals/Pain Pain Assessment Pain Assessment: No/denies pain    Home Living Family/patient expects to be discharged to:: Private residence Living Arrangements: Spouse/significant other Available Help at Discharge: Family;Available PRN/intermittently Type of Home: House Home Access: Stairs to enter Entrance Stairs-Rails: None Entrance Stairs-Number of Steps: 3  Home Layout: One level Home Equipment: None      Prior Function Prior Level of Function : Independent/Modified  Independent;Driving               ADLs Comments: Pt is retired and Ind at baseline. Pt does have visual deficits at baseline and wife assists with driving.     Extremity/Trunk Assessment   Upper Extremity Assessment Upper Extremity Assessment: Generalized weakness    Lower Extremity Assessment Lower Extremity Assessment: Generalized weakness    Cervical / Trunk Assessment Cervical / Trunk Assessment: Normal  Communication   Communication Communication: No apparent difficulties    Cognition Arousal: Alert Behavior During Therapy: WFL for tasks assessed/performed   PT - Cognitive impairments: Awareness, Problem solving, Safety/Judgement                         Following commands: Intact       Cueing       General Comments General comments (skin integrity, edema, etc.): spO2 97% on 2L on arrival. Decreased to RA with spO2 dropping to 83% with short mobility to bathroom. Donned 2L for hallway mobility with spO2 maintaining 90-91%. Left in bed on 2L at end of session    Exercises     Assessment/Plan    PT Assessment Patient needs continued PT services  PT Problem List Decreased strength;Decreased activity tolerance;Decreased balance;Decreased mobility;Decreased coordination;Decreased cognition;Decreased safety awareness;Decreased knowledge of use of DME;Decreased knowledge of precautions;Cardiopulmonary status limiting activity       PT Treatment Interventions DME instruction;Gait training;Stair training;Functional mobility training;Therapeutic activities;Therapeutic exercise;Balance training;Neuromuscular re-education;Patient/family education    PT Goals (Current goals can be found in the Care Plan section)  Acute Rehab PT Goals Patient Stated Goal: did not state PT Goal Formulation: With patient/family Time For Goal Achievement: 05/19/24 Potential to Achieve Goals: Good    Frequency Min 3X/week     Co-evaluation               AM-PAC PT  "6 Clicks" Mobility  Outcome Measure Help needed turning from your back to your side while in a flat bed without using bedrails?: A Little Help needed moving from lying on your back to sitting on the side of a flat bed without using bedrails?: A Little Help needed moving to and from a bed to a chair (including a wheelchair)?: A Little Help needed standing up from a chair using your arms (e.g., wheelchair or bedside chair)?: A Little Help needed to walk in hospital room?: A Lot Help needed climbing 3-5 steps with a railing? : Total 6 Click Score: 15    End of Session Equipment Utilized During Treatment: Oxygen Activity Tolerance: Patient tolerated treatment well Patient left: in bed;with call bell/phone within reach;with bed alarm set;with family/visitor present Nurse Communication: Mobility status PT Visit Diagnosis: Unsteadiness on feet (R26.81);Muscle weakness (generalized) (M62.81);Difficulty in walking, not elsewhere classified (R26.2)    Time: 6213-0865 PT Time Calculation (min) (ACUTE ONLY): 27 min   Charges:   PT Evaluation $PT Eval Moderate Complexity: 1 Mod   PT General Charges $$ ACUTE PT VISIT: 1 Visit         Janine Melbourne, PT, DPT Physical Therapist - [place]  [facility]   Aylin Rhoads A Pancho Rushing 05/05/2024, 10:14 AM

## 2024-05-05 NOTE — Evaluation (Signed)
 Occupational Therapy Evaluation Patient Details Name: Rick Wang MRN: [id number] DOB: [birth year] Today's Date: 05/05/2024   History of Present Illness   68 y/o male presented to ED on 05/02/24 after being found on floor by EMS. Admitted for septic shock due to UTI. PMH: T1DM, hx of kidney transplant, CKD, DVT, HTN, esophagitis with dysphagia.     Clinical Impressions Patient presenting with  decreased Ind in self care,balance, functional mobility/transfers, endurance, and safety awareness. Patient reports being Mod I at baseline without use of AD and lives at home with wife. Pt is retired and has visual deficits at baseline related to depth perception and visual field limitations. Pt with tangential speech with cues to redirect to task. Pt stands with min guard and found to have been incontinent of BM and unaware. Pt ambulates to bathroom with min HHA and able to have BM on commode. Pt stands with min guard for balance for  peri hygiene. Pt was placed on RA but desaturated to 83% and placed back on 2Ls via [place] for the remainder of the session. Pt ambulates in hallway with CGA but then with significant LOB with head turns and distractions requiring mod A to correct for safety. Patient will benefit from acute OT to increase overall independence in the areas of ADLs, functional mobility, and safety awareness in order to safely discharge.     If plan is discharge home, recommend the following:   A little help with walking and/or transfers;A little help with bathing/dressing/bathroom;Assistance with cooking/housework;Assist for transportation;Help with stairs or ramp for entrance;Direct supervision/assist for medications management;Direct supervision/assist for financial management;Supervision due to cognitive status     Functional Status Assessment   Patient has had a recent decline in their functional status and demonstrates the ability to make significant improvements in function in a  reasonable and predictable amount of time.     Equipment Recommendations   BSC/3in1;Other (comment) (RW)      Precautions/Restrictions   Precautions Precautions: Fall     Mobility Bed Mobility Overal bed mobility: Needs Assistance Bed Mobility: Supine to Sit, Sit to Supine     Supine to sit: Supervision Sit to supine: Supervision   General bed mobility comments: min cues for technique    Transfers Overall transfer level: Needs assistance Equipment used: 1 person hand held assist Transfers: Sit to/from Stand Sit to Stand: Contact guard assist           General transfer comment: mod A to correct balance in hallway with head turns and distractions      Balance Overall balance assessment: Needs assistance Sitting-balance support: Feet supported, Bilateral upper extremity supported Sitting balance-Leahy Scale: Good     Standing balance support: During functional activity, Single extremity supported Standing balance-Leahy Scale: Poor                             ADL either performed or assessed with clinical judgement   ADL Overall ADL's : Needs assistance/impaired                         Toilet Transfer: Minimal assistance;Ambulation   Toileting- Clothing Manipulation and Hygiene: Minimal assistance;Sit to/from stand               Vision Patient Visual Report: No change from baseline              Pertinent Vitals/Pain Pain Assessment Pain Assessment: No/denies pain  Extremity/Trunk Assessment Upper Extremity Assessment Upper Extremity Assessment: Generalized weakness   Lower Extremity Assessment Lower Extremity Assessment: Generalized weakness   Cervical / Trunk Assessment Cervical / Trunk Assessment: Normal   Communication Communication Communication: No apparent difficulties   Cognition Arousal: Alert Behavior During Therapy: Impulsive Cognition: Cognition impaired   Orientation impairments:  Time Awareness: Intellectual awareness impaired, Online awareness impaired   Attention impairment (select first level of impairment): Sustained attention Executive functioning impairment (select all impairments): Organization, Sequencing, Reasoning, Problem solving                   Following commands: Intact       Cueing  General Comments      spO2 97% on 2L on arrival. Decreased to RA with spO2 dropping to 83% with short mobility to bathroom. Donned 2L for hallway mobility with spO2 maintaining 90-91%. Left in bed on 2L at end of session           Home Living Family/patient expects to be discharged to:: Private residence Living Arrangements: Spouse/significant other Available Help at Discharge: Family;Available PRN/intermittently Type of Home: House Home Access: Stairs to enter [employer] of Steps: 3 Entrance Stairs-Rails: None Home Layout: One level     Bathroom Shower/Tub: Tub/shower unit         Home Equipment: None          Prior Functioning/Environment Prior Level of Function : Independent/Modified Independent;Driving               ADLs Comments: Pt is retired and Ind at baseline. Pt does have visual deficits at baseline and wife assists with driving.    OT Problem List: Decreased strength;Decreased safety awareness;Decreased activity tolerance;Decreased knowledge of use of DME or AE;Impaired balance (sitting and/or standing);Decreased knowledge of precautions;Cardiopulmonary status limiting activity   OT Treatment/Interventions: Self-care/ADL training;Therapeutic activities;Therapeutic exercise;Energy conservation;Balance training;DME and/or AE instruction;Other (comment)      OT Goals(Current goals can be found in the care plan section)   Acute Rehab OT Goals Patient Stated Goal: to go home and get better OT Goal Formulation: With patient/family Time For Goal Achievement: 05/19/24 Potential to Achieve Goals: Fair ADL  Goals Pt Will Perform Grooming: with modified independence;standing Pt Will Perform Lower Body Dressing: with modified independence;sit to/from stand Pt Will Transfer to Toilet: with modified independence;ambulating Pt Will Perform Toileting - Clothing Manipulation and hygiene: with modified independence;sit to/from stand   OT Frequency:  Min 2X/week    Co-evaluation              AM-PAC OT "6 Clicks" Daily Activity     Outcome Measure Help from another person eating meals?: None Help from another person taking care of personal grooming?: None Help from another person toileting, which includes using toliet, bedpan, or urinal?: A Little Help from another person bathing (including washing, rinsing, drying)?: A Little Help from another person to put on and taking off regular upper body clothing?: A Little Help from another person to put on and taking off regular lower body clothing?: A Little 6 Click Score: 20   End of Session Nurse Communication: Mobility status  Activity Tolerance: Patient tolerated treatment well Patient left: in bed;with call bell/phone within reach;with bed alarm set;with nursing/sitter in room  OT Visit Diagnosis: Unsteadiness on feet (R26.81);Muscle weakness (generalized) (M62.81)                Time: 1610-9604 OT Time Calculation (min): 27 min Charges:  OT General Charges $OT Visit: 1  Visit OT Evaluation $OT Eval Moderate Complexity: 1 [address], [place], OTR/L , CBIS ascom [phone]  05/05/24, 10:17 AM

## 2024-05-05 NOTE — Progress Notes (Signed)
 [facility] Progress Note  Patient: Rick Wang    ZOX:[id number]  DOA: 05/02/2024     Date of Service: the patient was seen and examined on 05/05/2024  Chief Complaint  Patient presents with   Fever   Near Syncope   Brief hospital course: Rick Wang is 68 year old with h/o HTN, HLD, Diabetes type 1, Renal transplant 2012, CKD, DVT, Eosinophilic esophagitis with dysphagia that presented 5/20 to the ER with complaints of Fever and syncope. To review, patient received Hep B vaccine Monday, on Tuesday developed Malaise, poor appetite, fever, nausea, confusion. Wife states that he was standing and his legs gave out, she assisted him to the ground. EMS was called, upon arrival to the ER BP 113/92, HR 114, Temp 102.6. Labs revealed chronic Leukopenia 1.5, hgb 10.3, Plts 112, BUN 43, creat 2.07, Lactic acid 5.2, PT 17, INR 1.4, Beta hydroxybutyrate negative, RVP negative. UA+, culture pending, BC x 2, and he was started on Cefepime and Vancomycin. He was given 30 ml/kg IVF bolus. With ongoing hypotension he was given and additional 1 liter (3 Liters total) and started on Levophed infusion. He is now admitted to the ICU for further management of his septic shock.   5/21: Admitted to ICU with septic shock due to UTI. BC x 2, RVP negative, Urine culture pending, Cefepime + Vancomycin. S/p 3 Liters IVF. Starting insulin  drip and discontinuing patients pump.  05/04/24-patient had insomnia overnight, on 2L/min [place].  S/p ID eval.  Remains oliguric and has been seen by nephrology. Has dysphagia and is for SLP.  Remains off vasopressors.  Transferred to [facility] on 5/23  Assessment and Plan:   # Septic Shock secondary to UTI # Immunocompromised, chronic # Lactic Acidosis # Hypotension - s/p  Cefepime + Vancomycin - s/p 3 liters IVFB, and  IVF @ 150 ml/hr, s/p Levophed infusion - due to Persistent hypotension stress dose steroids started  5/21 Urine culture growing E. coli pansensitive 5/20 blood  culture growing E. Coli, pansensitive 5/21 started ceftriaxone 2 g IV daily 5/23 repeat blood culture <12 hr NGTD CT a/p: No acute inflammatory process identified within the abdomen or pelvis. Still elevated WBC count and procalcitonin is elevated ID following   Acute hypoxic respiratory failure secondary to pulmonary edema and pleural effusion secondary to fluid resuscitation CT Chest: small left and small-to-moderate right pleural effusion W/ compressive atelectatic changes in the bilateral lower lobes. No lung mass, consolidation or pneumothorax. No suspicious lung nodule. -Continue supplemental O2 halogen and gradually wean off -Patient will benefit from thoracentesis, will discuss but it will not be done over the weekend    Acute on Chronic Kidney Disease  Renal Transplant 2012 @ [facility] - Reach out to Transplant team in AM - Daily BMP - Avoid nephrotoxic agents - held home Mycophenolate and Tacrolimus  on admission - Fluid resuscitation  - Renal US  5/23 as nephro: Will restart tacrolimus  dismiss at half of home dose, 1 mg in a.m. and 0.5 mg in evening.  Will consider restarting Myfortic tomorrow.Spoke with Dr Helga Loan, who agrees with treatment plan.   # Urinary retention, Foley catheter was inserted on 5/23 Started Flomax RN was advised to text me total urine volume output after Foley catheter insertion.     # Diabetes Type 1, utilizes insulin  pump Removed Insulin  pump and Endotool was started in the ICU - Glucose checks per Endotool - Consider endocrine consult   #Hypertension #Hyperlipidemia Antihypertensive medications were held due to sepsis and hypotension  Continue to hold statin for now 5/23 BP improved, patient is hypertensive Patient was on amlodipine 5 mg and lisinopril 40 mg at home Started Lopressor 25 mg p.o. twice daily with holding parameters    # Thrombocytopenia #Anemia Hb 9.6 Platelets 57K - Monitor for bleeding - Daily CBC   #Eosinophilic  Esophagitis w/ Dysphagia Patient is s/p recent EGD with biopsy's - Continue home PPI -SLP evaluation done, no oropharyngeal dysphagia.   Body mass index is 22.17 kg/m.  Interventions:  Diet: Dysphagia 3 diet with thin liquids DVT Prophylaxis: Subcutaneous Heparin    Advance goals of care discussion: Full code  Family Communication: family was present at bedside, at the time of interview.  The pt provided permission to discuss medical plan with the family. Opportunity was given to ask question and all questions were answered satisfactorily.   Disposition:  Pt is from home, admitted with sepsis due to UTI, still on IV Abx, which precludes a safe discharge. Discharge to Home, when stable and cleared by ID and nephrology.  Subjective: No significant events overnight, patient is still having shortness of breath, denied any abdominal pain, no chest pain or palpitations.   Physical Exam: General: NAD, lying comfortably Appear in no distress, affect appropriate Eyes: PERRLA ENT: Oral Mucosa Clear, moist  Neck: no JVD,  Cardiovascular: S1 and S2 Present, no Murmur,  Respiratory: Equal air entry bilaterally, bibasilar crackles, no wheezes  Abdomen: Bowel Sound present, Soft and no tenderness,  Skin: no rashes Extremities: no Pedal edema, no calf tenderness Neurologic: without any new focal findings Gait not checked due to patient safety concerns  Vitals:   05/04/24 2239 05/05/24 0348 05/05/24 0500 05/05/24 0816  BP: 138/87 129/76  (!) 146/88  Pulse: (!) 102 99  99  Resp: 18 18    Temp: 98 F (36.7 C) 97.9 F (36.6 C)  97.6 F (36.4 C)  TempSrc: Oral Oral  Oral  SpO2: 95% 91%  97%  Weight:   70.1 kg   Height:        Intake/Output Summary (Last 24 hours) at 05/05/2024 1557 Last data filed at 05/05/2024 1400 Gross per 24 hour  Intake 529.77 ml  Output --  Net 529.77 ml   Filed Weights   05/03/24 0345 05/04/24 0500 05/05/24 0500  Weight: 67.3 kg 63.8 kg 70.1 kg     Data Reviewed: I have personally reviewed and interpreted daily labs, tele strips, imagings as discussed above. I reviewed all nursing notes, pharmacy notes, vitals, pertinent old records I have discussed plan of care as described above with RN and patient/family.  CBC: Recent Labs  Lab 05/02/24 2157 05/03/24 0628 05/04/24 0308 05/05/24 0017  WBC 1.5* 10.7* 13.3* 17.3*  NEUTROABS 0.9*  --   --   --   HGB 10.3* 8.6* 9.3* 9.6*  HCT 33.8* 27.2* 28.7* 29.4*  MCV 101.5* 98.6 96.6 95.1  PLT 112* 96* 64* 57*   Basic Metabolic Panel: Recent Labs  Lab 05/02/24 2157 05/03/24 0628 05/04/24 0308 05/04/24 0712 05/05/24 0017  NA 142 137 139 139 138  K 4.0 3.7 5.6* 4.4 4.5  CL 110 107 108 107 108  CO2 18* 16* 20* 19* 21*  GLUCOSE 101* 157* 147* 124* 188*  BUN 43* 44* 63* 64* 84*  CREATININE 2.07* 2.73* 3.76* 3.77* 3.90*  CALCIUM 8.9 8.1* 8.5* 8.2* 8.2*  MG  --  1.0* 2.1  --  2.3  PHOS  --  2.4* 5.8*  --  5.8*  Studies: CT ABDOMEN PELVIS WO CONTRAST Result Date: 05/05/2024 CLINICAL DATA:  Abdominal pain, acute, nonlocalized; Pleural effusion, malignancy suspected. EXAM: CT CHEST, ABDOMEN AND PELVIS WITHOUT CONTRAST TECHNIQUE: Multidetector CT imaging of the chest, abdomen and pelvis was performed following the standard protocol without IV contrast. RADIATION DOSE REDUCTION: This exam was performed according to the departmental dose-optimization program which includes automated exposure control, adjustment of the mA and/or kV according to patient size and/or use of iterative reconstruction technique. COMPARISON:  None Available. FINDINGS: CT CHEST FINDINGS Cardiovascular: Normal cardiac size. no pericardial effusion. No aortic aneurysm. There are coronary artery calcifications, in keeping with coronary artery disease. Mediastinum/Nodes: Visualized thyroid  gland appears grossly unremarkable. No solid / cystic mediastinal masses. The esophagus is nondistended precluding optimal  assessment. No mediastinal or axillary lymphadenopathy by size criteria. Evaluation of bilateral hila is limited due to lack on intravenous contrast: however, no large hilar lymphadenopathy identified. Lungs/Pleura: The central tracheo-bronchial tree is patent. There is small left and small-to-moderate right pleural effusion with associated compressive atelectatic changes in the bilateral lower lobes. There are additional patchy areas of linear, plate-like atelectasis and/or scarring throughout bilateral lungs. No mass, consolidation or pneumothorax. No suspicious lung nodules. There multiple sub 4 mm calcified granulomas scattered throughout bilateral lungs. Musculoskeletal: The visualized soft tissues of the chest wall are grossly unremarkable. No suspicious osseous lesions. There are mild multilevel degenerative changes in the visualized spine. CT ABDOMEN PELVIS FINDINGS Hepatobiliary: The liver is normal in size. Non-cirrhotic configuration. No suspicious mass. No intrahepatic or extrahepatic bile duct dilation. No calcified gallstones. Normal gallbladder wall thickness. No pericholecystic inflammatory changes. Pancreas: Unremarkable. No pancreatic ductal dilatation or surrounding inflammatory changes. Spleen: Within normal limits. No focal lesion. Adrenals/Urinary Tract: Adrenal glands are unremarkable. Markedly small/atrophic bilateral kidneys noted with extensive vascular calcifications. No nephroureterolithiasis or obstructive uropathy. There is implanted right pelvic kidney noted. No nephroureterolithiasis or obstructive uropathy. Unremarkable urinary bladder. Stomach/Bowel: There is a small diverticulum arising from the second part of duodenum. No disproportionate dilation of the small or large bowel loops. No evidence of abnormal bowel wall thickening or inflammatory changes. The appendix is unremarkable. There are multiple diverticula mainly in the left hemi colon, without imaging signs of  diverticulitis. Vascular/Lymphatic: No ascites or pneumoperitoneum. No abdominal or pelvic lymphadenopathy, by size criteria. No aneurysmal dilation of the major abdominal arteries. There are mild peripheral atherosclerotic vascular calcifications of the aorta and its major branches. Reproductive: Normal size prostate. Symmetric seminal vesicles. Other: There is a small fat containing umbilical hernia. There is tiny fat containing right inguinal hernia and small left inguinal hernia containing fat, ascitic fluid and portion of unobstructed sigmoid colon. There is mild anasarca the soft tissues and abdominal wall are otherwise unremarkable. Musculoskeletal: No suspicious osseous lesions. There are mild multilevel degenerative changes in the visualized spine. IMPRESSION: 1. There is small left and small-to-moderate right pleural effusion with associated compressive atelectatic changes in the bilateral lower lobes. No lung mass, consolidation or pneumothorax. No suspicious lung nodule. 2. No acute inflammatory process identified within the abdomen or pelvis. 3. Multiple other nonacute observations, as described above. Aortic Atherosclerosis (ICD10-I70.0). Electronically Signed   By: Beula Brunswick M.D.   On: 05/05/2024 15:01   CT CHEST WO CONTRAST Result Date: 05/05/2024 CLINICAL DATA:  Abdominal pain, acute, nonlocalized; Pleural effusion, malignancy suspected. EXAM: CT CHEST, ABDOMEN AND PELVIS WITHOUT CONTRAST TECHNIQUE: Multidetector CT imaging of the chest, abdomen and pelvis was performed following the standard protocol without IV contrast. RADIATION DOSE  REDUCTION: This exam was performed according to the departmental dose-optimization program which includes automated exposure control, adjustment of the mA and/or kV according to patient size and/or use of iterative reconstruction technique. COMPARISON:  None Available. FINDINGS: CT CHEST FINDINGS Cardiovascular: Normal cardiac size. no pericardial effusion.  No aortic aneurysm. There are coronary artery calcifications, in keeping with coronary artery disease. Mediastinum/Nodes: Visualized thyroid  gland appears grossly unremarkable. No solid / cystic mediastinal masses. The esophagus is nondistended precluding optimal assessment. No mediastinal or axillary lymphadenopathy by size criteria. Evaluation of bilateral hila is limited due to lack on intravenous contrast: however, no large hilar lymphadenopathy identified. Lungs/Pleura: The central tracheo-bronchial tree is patent. There is small left and small-to-moderate right pleural effusion with associated compressive atelectatic changes in the bilateral lower lobes. There are additional patchy areas of linear, plate-like atelectasis and/or scarring throughout bilateral lungs. No mass, consolidation or pneumothorax. No suspicious lung nodules. There multiple sub 4 mm calcified granulomas scattered throughout bilateral lungs. Musculoskeletal: The visualized soft tissues of the chest wall are grossly unremarkable. No suspicious osseous lesions. There are mild multilevel degenerative changes in the visualized spine. CT ABDOMEN PELVIS FINDINGS Hepatobiliary: The liver is normal in size. Non-cirrhotic configuration. No suspicious mass. No intrahepatic or extrahepatic bile duct dilation. No calcified gallstones. Normal gallbladder wall thickness. No pericholecystic inflammatory changes. Pancreas: Unremarkable. No pancreatic ductal dilatation or surrounding inflammatory changes. Spleen: Within normal limits. No focal lesion. Adrenals/Urinary Tract: Adrenal glands are unremarkable. Markedly small/atrophic bilateral kidneys noted with extensive vascular calcifications. No nephroureterolithiasis or obstructive uropathy. There is implanted right pelvic kidney noted. No nephroureterolithiasis or obstructive uropathy. Unremarkable urinary bladder. Stomach/Bowel: There is a small diverticulum arising from the second part of duodenum. No  disproportionate dilation of the small or large bowel loops. No evidence of abnormal bowel wall thickening or inflammatory changes. The appendix is unremarkable. There are multiple diverticula mainly in the left hemi colon, without imaging signs of diverticulitis. Vascular/Lymphatic: No ascites or pneumoperitoneum. No abdominal or pelvic lymphadenopathy, by size criteria. No aneurysmal dilation of the major abdominal arteries. There are mild peripheral atherosclerotic vascular calcifications of the aorta and its major branches. Reproductive: Normal size prostate. Symmetric seminal vesicles. Other: There is a small fat containing umbilical hernia. There is tiny fat containing right inguinal hernia and small left inguinal hernia containing fat, ascitic fluid and portion of unobstructed sigmoid colon. There is mild anasarca the soft tissues and abdominal wall are otherwise unremarkable. Musculoskeletal: No suspicious osseous lesions. There are mild multilevel degenerative changes in the visualized spine. IMPRESSION: 1. There is small left and small-to-moderate right pleural effusion with associated compressive atelectatic changes in the bilateral lower lobes. No lung mass, consolidation or pneumothorax. No suspicious lung nodule. 2. No acute inflammatory process identified within the abdomen or pelvis. 3. Multiple other nonacute observations, as described above. Aortic Atherosclerosis (ICD10-I70.0). Electronically Signed   By: Beula Brunswick M.D.   On: 05/05/2024 15:01    Scheduled Meds:  brimonidine  1 drop Right Eye TID   brinzolamide  1 drop Right Eye TID   Chlorhexidine Gluconate Cloth  6 each Topical Daily   heparin injection (subcutaneous)  5,000 Units Subcutaneous Q8H   hydrocortisone sod succinate (SOLU-CORTEF) inj  100 mg Intravenous Q8H   insulin  aspart  0-9 Units Subcutaneous Q4H   insulin  glargine-yfgn  15 Units Subcutaneous Daily   mupirocin ointment  1 Application Nasal BID   pantoprazole  40  mg Oral BID   tacrolimus   0.5 mg Oral Q1200   [  START ON 05/06/2024] tacrolimus   1 mg Oral Q1200   Continuous Infusions:  cefTRIAXone (ROCEPHIN)  IV Stopped (05/05/24 1225)   PRN Meds: acetaminophen, docusate sodium, fentaNYL (SUBLIMAZE) injection, ondansetron (ZOFRAN) IV, mouth rinse, polyethylene glycol  Time spent: 55 minutes  Author: Althia Atlas. MD [facility] 05/05/2024 3:57 PM  To reach On-call, see care teams to locate the attending and reach out to them via [email or web address]. If 7PM-7AM, please contact night-coverage If you still have difficulty reaching the attending provider, please page the [facility] (Director on Call) for [facility] on amion for assistance.

## 2024-05-05 NOTE — Progress Notes (Signed)
 [facility]  ROUNDING NOTE   Subjective:   Patient seen sitting up in bed Alert and oriented Wife at bedside  Appears well today  Creatinine 3.90  Objective:  Vital signs in last 24 hours:  Temp:  [97.6 F (36.4 C)-98 F (36.7 C)] 97.6 F (36.4 C) (05/23 0816) Pulse Rate:  [98-107] 99 (05/23 0816) Resp:  [15-22] 18 (05/23 0348) BP: (112-149)/(73-89) 146/88 (05/23 0816) SpO2:  [91 %-99 %] 97 % (05/23 0816) Weight:  [70.1 kg] 70.1 kg (05/23 0500)  Weight change: 6.3 kg Filed Weights   05/03/24 0345 05/04/24 0500 05/05/24 0500  Weight: 67.3 kg 63.8 kg 70.1 kg    Intake/Output: I/O last 3 completed shifts: In: 1564.2 [P.O.:240; I.V.:1224.2; IV Piggyback:100] Out: 0    Intake/Output this shift:  Total I/O In: 240 [P.O.:240] Out: -   Physical Exam: General: NAD  Head: Normocephalic, atraumatic. Moist oral mucosal membranes  Eyes: Anicteric  Lungs:  Clear to auscultation, normal effort  Heart: Regular rate and rhythm  Abdomen:  Soft, nontender, nondistended  Extremities: No peripheral edema.  Neurologic: Alert and oriented, moving all four extremities  Skin: No lesions  Access: None    Basic Metabolic Panel: Recent Labs  Lab 05/02/24 2157 05/03/24 0628 05/04/24 0308 05/04/24 0712 05/05/24 0017  NA 142 137 139 139 138  K 4.0 3.7 5.6* 4.4 4.5  CL 110 107 108 107 108  CO2 18* 16* 20* 19* 21*  GLUCOSE 101* 157* 147* 124* 188*  BUN 43* 44* 63* 64* 84*  CREATININE 2.07* 2.73* 3.76* 3.77* 3.90*  CALCIUM 8.9 8.1* 8.5* 8.2* 8.2*  MG  --  1.0* 2.1  --  2.3  PHOS  --  2.4* 5.8*  --  5.8*    Liver Function Tests: Recent Labs  Lab 05/02/24 2157  AST 42*  ALT 25  ALKPHOS 64  BILITOT 1.3*  PROT 6.4*  ALBUMIN 3.3*   No results for input(s): "LIPASE", "AMYLASE" in the last 168 hours. No results for input(s): "AMMONIA" in the last 168 hours.  CBC: Recent Labs  Lab 05/02/24 2157 05/03/24 0628 05/04/24 0308 05/05/24 0017  WBC 1.5* 10.7*  13.3* 17.3*  NEUTROABS 0.9*  --   --   --   HGB 10.3* 8.6* 9.3* 9.6*  HCT 33.8* 27.2* 28.7* 29.4*  MCV 101.5* 98.6 96.6 95.1  PLT 112* 96* 64* 57*    Cardiac Enzymes: No results for input(s): "CKTOTAL", "CKMB", "CKMBINDEX", "TROPONINI" in the last 168 hours.  BNP: Invalid input(s): "POCBNP"  CBG: Recent Labs  Lab 05/04/24 2020 05/04/24 2303 05/05/24 0350 05/05/24 0813 05/05/24 1223  GLUCAP 208* 178* 167* 143* 177*    Microbiology: Results for orders placed or performed during the hospital encounter of 05/02/24  Culture, blood (Routine x 2)     Status: Abnormal   Collection Time: 05/02/24  9:57 PM   Specimen: BLOOD  Result Value Ref Range Status   Specimen Description   Final    BLOOD BLOOD RIGHT ARM Performed at [facility], [address], [place], [place] [address]    Special Requests   Final    BOTTLES DRAWN AEROBIC AND ANAEROBIC Blood Culture adequate volume Performed at [facility], [address], [place], [place] [address]    Culture  Setup Time   Final    GRAM NEGATIVE RODS IN BOTH AEROBIC AND ANAEROBIC BOTTLES CRITICAL VALUE NOTED.  VALUE IS CONSISTENT WITH PREVIOUSLY REPORTED AND CALLED VALUE. Performed at [facility],  [address], [place], [place] [address]    Culture (A)  Final    ESCHERICHIA COLI SUSCEPTIBILITIES PERFORMED ON PREVIOUS CULTURE WITHIN THE LAST 5 DAYS. Performed at [facility], [address], [place], [place] [address]    Report Status 05/05/2024 FINAL  Final  Culture, blood (Routine x 2)     Status: Abnormal   Collection Time: 05/02/24  9:58 PM   Specimen: BLOOD  Result Value Ref Range Status   Specimen Description   Final    BLOOD BLOOD RIGHT ARM Performed at [facility], [address] [place], [place] [address]    Special Requests   Final    BOTTLES DRAWN AEROBIC AND ANAEROBIC Blood Culture adequate volume Performed at [facility], [address]  [place], [place] [address]    Culture  Setup Time   Final    GRAM NEGATIVE RODS IN BOTH AEROBIC AND ANAEROBIC BOTTLES CRITICAL RESULT CALLED TO, READ BACK BY AND VERIFIED WITH: SHEEMA HALLAJI PHARMD 1020 05/03/24 HNM    Culture ESCHERICHIA COLI (A)  Final   Report Status 05/05/2024 FINAL  Final   Organism ID, Bacteria ESCHERICHIA COLI  Final   Organism ID, Bacteria ESCHERICHIA COLI  Final      Susceptibility   Escherichia coli - KIRBY BAUER*    CEFAZOLIN SENSITIVE Sensitive    Escherichia coli - MIC*    AMPICILLIN 4 SENSITIVE Sensitive     CEFEPIME <=0.12 SENSITIVE Sensitive     CEFTAZIDIME <=1 SENSITIVE Sensitive     CEFTRIAXONE <=0.25 SENSITIVE Sensitive     CIPROFLOXACIN <=0.25 SENSITIVE Sensitive     GENTAMICIN <=1 SENSITIVE Sensitive     IMIPENEM <=0.25 SENSITIVE Sensitive     TRIMETH/SULFA <=20 SENSITIVE Sensitive     AMPICILLIN/SULBACTAM <=2 SENSITIVE Sensitive     PIP/TAZO <=4 SENSITIVE Sensitive ug/mL    * ESCHERICHIA COLI    ESCHERICHIA COLI  Blood Culture ID Panel (Reflexed)     Status: Abnormal   Collection Time: 05/02/24  9:58 PM  Result Value Ref Range Status   Enterococcus faecalis NOT DETECTED NOT DETECTED Final   Enterococcus Faecium NOT DETECTED NOT DETECTED Final   Listeria monocytogenes NOT DETECTED NOT DETECTED Final   Staphylococcus species NOT DETECTED NOT DETECTED Final   Staphylococcus aureus (BCID) NOT DETECTED NOT DETECTED Final   Staphylococcus epidermidis NOT DETECTED NOT DETECTED Final   Staphylococcus lugdunensis NOT DETECTED NOT DETECTED Final   Streptococcus species NOT DETECTED NOT DETECTED Final   Streptococcus agalactiae NOT DETECTED NOT DETECTED Final   Streptococcus pneumoniae NOT DETECTED NOT DETECTED Final   Streptococcus pyogenes NOT DETECTED NOT DETECTED Final   A.calcoaceticus-baumannii NOT DETECTED NOT DETECTED Final   Bacteroides fragilis NOT DETECTED NOT DETECTED Final   Enterobacterales DETECTED (A) NOT DETECTED Final    Comment:  Enterobacterales represent a large order of gram negative bacteria, not a single organism. CRITICAL RESULT CALLED TO, READ BACK BY AND VERIFIED WITH: SHEEMA HALLAJI PHARMD 1020 05/03/24 HNM    Enterobacter cloacae complex NOT DETECTED NOT DETECTED Final   Escherichia coli DETECTED (A) NOT DETECTED Final    Comment: CRITICAL RESULT CALLED TO, READ BACK BY AND VERIFIED WITH: SHEEMA HALLAJI PHARMD 1020 05/03/24 HNM    Klebsiella aerogenes NOT DETECTED NOT DETECTED Final   Klebsiella oxytoca NOT DETECTED NOT DETECTED Final   Klebsiella pneumoniae NOT DETECTED NOT DETECTED Final   Proteus species NOT DETECTED NOT DETECTED Final   Salmonella species NOT DETECTED NOT DETECTED  Final   Serratia marcescens NOT DETECTED NOT DETECTED Final   Haemophilus influenzae NOT DETECTED NOT DETECTED Final   Neisseria meningitidis NOT DETECTED NOT DETECTED Final   Pseudomonas aeruginosa NOT DETECTED NOT DETECTED Final   Stenotrophomonas maltophilia NOT DETECTED NOT DETECTED Final   Candida albicans NOT DETECTED NOT DETECTED Final   Candida auris NOT DETECTED NOT DETECTED Final   Candida glabrata NOT DETECTED NOT DETECTED Final   Candida krusei NOT DETECTED NOT DETECTED Final   Candida parapsilosis NOT DETECTED NOT DETECTED Final   Candida tropicalis NOT DETECTED NOT DETECTED Final   Cryptococcus neoformans/gattii NOT DETECTED NOT DETECTED Final   CTX-M ESBL NOT DETECTED NOT DETECTED Final   Carbapenem resistance IMP NOT DETECTED NOT DETECTED Final   Carbapenem resistance KPC NOT DETECTED NOT DETECTED Final   Carbapenem resistance NDM NOT DETECTED NOT DETECTED Final   Carbapenem resist OXA 48 LIKE NOT DETECTED NOT DETECTED Final   Carbapenem resistance VIM NOT DETECTED NOT DETECTED Final    Comment: Performed at [facility], [address] [place], [place] [address]  Resp panel by RT-PCR (RSV, Flu A&B, Covid) Anterior Nasal Swab     Status: None   Collection Time: 05/02/24 11:17 PM   Specimen:  Anterior Nasal Swab  Result Value Ref Range Status   SARS Coronavirus 2 by RT PCR NEGATIVE NEGATIVE Final    Comment: (NOTE) SARS-CoV-2 target nucleic acids are NOT DETECTED.  The SARS-CoV-2 RNA is generally detectable in upper respiratory specimens during the acute phase of infection. The lowest concentration of SARS-CoV-2 viral copies this assay can detect is 138 copies/mL. A negative result does not preclude SARS-Cov-2 infection and should not be used as the sole basis for treatment or other patient management decisions. A negative result may occur with  improper specimen collection/handling, submission of specimen other than nasopharyngeal swab, presence of viral mutation(s) within the areas targeted by this assay, and inadequate number of viral copies(<138 copies/mL). A negative result must be combined with clinical observations, patient history, and epidemiological information. The expected result is Negative.  Fact Sheet for Patients:  BloggerCourse.com  Fact Sheet for Healthcare Providers:  SeriousBroker.it  This test is no t yet approved or cleared by the [place]  FDA and  has been authorized for detection and/or diagnosis of SARS-CoV-2 by FDA under an Emergency Use Authorization (EUA). This EUA will remain  in effect (meaning this test can be used) for the duration of the COVID-19 declaration under Section 564(b)(1) of the Act, 21 U.S.C.section 360bbb-3(b)(1), unless the authorization is terminated  or revoked sooner.       Influenza A by PCR NEGATIVE NEGATIVE Final   Influenza B by PCR NEGATIVE NEGATIVE Final    Comment: (NOTE) The Xpert Xpress SARS-CoV-2/FLU/RSV plus assay is intended as an aid in the diagnosis of influenza from Nasopharyngeal swab specimens and should not be used as a sole basis for treatment. Nasal washings and aspirates are unacceptable for Xpert Xpress SARS-CoV-2/FLU/RSV testing.  Fact  Sheet for Patients: BloggerCourse.com  Fact Sheet for Healthcare Providers: SeriousBroker.it  This test is not yet approved or cleared by the [place]  FDA and has been authorized for detection and/or diagnosis of SARS-CoV-2 by FDA under an Emergency Use Authorization (EUA). This EUA will remain in effect (meaning this test can be used) for the duration of the COVID-19 declaration under Section 564(b)(1) of the Act, 21 U.S.C. section 360bbb-3(b)(1), unless the authorization is terminated or revoked.     Resp Syncytial  Virus by PCR NEGATIVE NEGATIVE Final    Comment: (NOTE) Fact Sheet for Patients: BloggerCourse.com  Fact Sheet for Healthcare Providers: SeriousBroker.it  This test is not yet approved or cleared by the [place]  FDA and has been authorized for detection and/or diagnosis of SARS-CoV-2 by FDA under an Emergency Use Authorization (EUA). This EUA will remain in effect (meaning this test can be used) for the duration of the COVID-19 declaration under Section 564(b)(1) of the Act, 21 U.S.C. section 360bbb-3(b)(1), unless the authorization is terminated or revoked.  Performed at [facility], [address] [place], [place] [address]   Urine Culture     Status: Abnormal   Collection Time: 05/03/24 12:18 AM   Specimen: Urine, Random  Result Value Ref Range Status   Specimen Description   Final    URINE, RANDOM Performed at [facility], [address] [place], [place] [address]    Special Requests   Final    NONE Reflexed from [id number] Performed at [facility], [address] [place], [place] [address]    Culture >=100,000 COLONIES/mL ESCHERICHIA COLI (A)  Final   Report Status 05/05/2024 FINAL  Final   Organism ID, Bacteria ESCHERICHIA COLI (A)  Final      Susceptibility   Escherichia coli - MIC*    AMPICILLIN 4 SENSITIVE  Sensitive     CEFAZOLIN <=4 SENSITIVE Sensitive     CEFEPIME <=0.12 SENSITIVE Sensitive     CEFTRIAXONE <=0.25 SENSITIVE Sensitive     CIPROFLOXACIN <=0.25 SENSITIVE Sensitive     GENTAMICIN <=1 SENSITIVE Sensitive     IMIPENEM <=0.25 SENSITIVE Sensitive     NITROFURANTOIN <=16 SENSITIVE Sensitive     TRIMETH/SULFA <=20 SENSITIVE Sensitive     AMPICILLIN/SULBACTAM <=2 SENSITIVE Sensitive     PIP/TAZO <=4 SENSITIVE Sensitive ug/mL    * >=100,000 COLONIES/mL ESCHERICHIA COLI  MRSA Next Gen by PCR, Nasal     Status: Abnormal   Collection Time: 05/03/24  4:00 AM   Specimen: Nasal Mucosa; Nasal Swab  Result Value Ref Range Status   MRSA by PCR Next Gen DETECTED (A) NOT DETECTED Final    Comment: RESULT CALLED TO, READ BACK BY AND VERIFIED WITH: Salina Craver RN [phone] 05/03/24 HNM (NOTE) The GeneXpert MRSA Assay (FDA approved for NASAL specimens only), is one component of a comprehensive MRSA colonization surveillance program. It is not intended to diagnose MRSA infection nor to guide or monitor treatment for MRSA infections. Test performance is not FDA approved in patients less than 61 years old. Performed at [facility], [address] [place], [place] [address]   Culture, blood (Routine X 2) w Reflex to ID Panel     Status: None (Preliminary result)   Collection Time: 05/05/24 12:17 AM   Specimen: BLOOD RIGHT ARM  Result Value Ref Range Status   Specimen Description BLOOD RIGHT ARM  Final   Special Requests   Final    BOTTLES DRAWN AEROBIC AND ANAEROBIC Blood Culture adequate volume   Culture   Final    NO GROWTH < 12 HOURS Performed at [facility], [address] [place], [place] [address]    Report Status PENDING  Incomplete  Culture, blood (Routine X 2) w Reflex to ID Panel     Status: None (Preliminary result)   Collection Time: 05/05/24 12:17 AM   Specimen: BLOOD LEFT HAND  Result Value Ref Range Status   Specimen Description BLOOD LEFT HAND  Final  Special Requests   Final    BOTTLES DRAWN AEROBIC AND ANAEROBIC Blood Culture adequate volume   Culture   Final    NO GROWTH < 12 HOURS Performed at [facility], [address] [place], [place] [address]    Report Status PENDING  Incomplete    Coagulation Studies: Recent Labs    05/02/24 05/18/2156  LABPROT 17.0*  INR 1.4*    Urinalysis: Recent Labs    05/03/24 0018  COLORURINE YELLOW*  LABSPEC 1.012  PHURINE 5.0  GLUCOSEU NEGATIVE  HGBUR MODERATE*  BILIRUBINUR NEGATIVE  KETONESUR NEGATIVE  PROTEINUR >=300*  NITRITE NEGATIVE  LEUKOCYTESUR LARGE*      Imaging: DG Chest Port 1 View Result Date: 05/04/2024 CLINICAL DATA:  Shortness of breath. EXAM: PORTABLE CHEST 1 VIEW COMPARISON:  May 02, 2024. FINDINGS: The heart size and mediastinal contours are within normal limits. Increased bilateral perihilar and basilar opacities are noted concerning for pulmonary edema with possible small right pleural effusion. The visualized skeletal structures are unremarkable. IMPRESSION: Increased bilateral perihilar and basilar opacities are noted concerning for pulmonary edema with possible small right pleural effusion. Electronically Signed   By: Rosalene Colon M.D.   On: 05/04/2024 14:19     Medications:    cefTRIAXone (ROCEPHIN)  IV 2 g (05/05/24 1222)    brimonidine  1 drop Right Eye TID   brinzolamide  1 drop Right Eye TID   Chlorhexidine Gluconate Cloth  6 each Topical Daily   heparin injection (subcutaneous)  5,000 Units Subcutaneous Q8H   hydrocortisone sod succinate (SOLU-CORTEF) inj  100 mg Intravenous Q8H   insulin  aspart  0-9 Units Subcutaneous Q4H   insulin  glargine-yfgn  15 Units Subcutaneous Daily   mupirocin ointment  1 Application Nasal BID   pantoprazole  40 mg Oral BID   tacrolimus   0.5 mg Oral BID   acetaminophen, docusate sodium, fentaNYL (SUBLIMAZE) injection, ondansetron (ZOFRAN) IV, mouth rinse, polyethylene glycol  Assessment/ Plan:  Rick Wang is a 68 y.o.  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  Patient currently follows with [facility] nephrology, Dr. Rosebud Confer.  Baseline creatinine appears to be 1.4-1.6.  Creatinine on admission 2.73.  Acute kidney injury likely secondary to concurrent illness, septic shock.   Creatinine and white count reman elevated.  Will restart tacrolimus  dismiss at half of home dose, 1 mg in a.m. and 0.5 mg in evening.  Will consider restarting Myfortic tomorrow.Spoke with Dr Helga Loan, who agrees with treatment plan. Monitoring CT scan to evaluate if fluid present around kidney.   Lab Results  Component Value Date   CREATININE 3.90 (H) 05/05/2024   CREATININE 3.77 (H) 05/04/2024   CREATININE 3.76 (H) 05/04/2024    Intake/Output Summary (Last 24 hours) at 05/05/2024 1313 Last data filed at 05/05/2024 1038 Gross per 24 hour  Intake 583.32 ml  Output --  Net 583.32 ml    2.  Septic shock likely secondary to E. coli bacteremia UTI.  Received cefepime, ceftriaxone, vancomycin, and  metronidazole.  Now prescribed ceftriaxone only.  Pressors have been weaned.  ID  consulted.  Critical care to continue management.   3. Anemia of chronic kidney disease Lab Results  Component Value Date   HGB 9.6 (L) 05/05/2024  Hemoglobin stable.  No need for ESA's at this time.  4. Secondary Hyperparathyroidism: with outpatient  labs: None available  Lab Results  Component Value Date   CALCIUM 8.2 (L) 05/05/2024   PHOS 5.8 (H) 05/05/2024    Will continue to monitor bone minerals at this time.   LOS: 2 Koben Daman 5/23/20251:13 PM

## 2024-05-05 NOTE — Plan of Care (Incomplete)
  Problem: Education: Goal: Knowledge of General Education information will improve Description: Including pain rating scale, medication(s)/side effects and non-pharmacologic comfort measures Outcome: Progressing   Problem: Health Behavior/Discharge Planning: Goal: Ability to manage health-related needs will improve Outcome: Progressing   Problem: Clinical Measurements: Goal: Ability to maintain clinical measurements within normal limits will improve Outcome: Progressing Goal: Will remain free from infection Outcome: Progressing Goal: Diagnostic test results will improve Outcome: Progressing   Problem: Elimination: Goal: Will not experience complications related to urinary retention Outcome: Progressing   Problem: Safety: Goal: Ability to remain free from injury will improve Outcome: Progressing   Problem: Fluid Volume: Goal: Ability to maintain a balanced intake and output will improve Outcome: Progressing

## 2024-05-05 NOTE — Progress Notes (Signed)
 Date of Admission:  05/02/2024    ID: Rick Wang is a 68 y.o. male Principal Problem:   Severe sepsis (HCC)    Subjective: Pt had foley placed  Medications:   brimonidine  1 drop Right Eye TID   brinzolamide  1 drop Right Eye TID   Chlorhexidine Gluconate Cloth  6 each Topical Daily   heparin injection (subcutaneous)  5,000 Units Subcutaneous Q8H   hydrocortisone sod succinate (SOLU-CORTEF) inj  100 mg Intravenous Q8H   insulin  aspart  0-9 Units Subcutaneous Q4H   insulin  glargine-yfgn  15 Units Subcutaneous Daily   mupirocin ointment  1 Application Nasal BID   pantoprazole  40 mg Oral BID    Objective: Vital signs in last 24 hours: Patient Vitals for the past 24 hrs:  BP Temp Temp src Pulse Resp SpO2 Weight  05/05/24 0816 (!) 146/88 97.6 F (36.4 C) Oral 99 -- 97 % --  05/05/24 0500 -- -- -- -- -- -- 70.1 kg  05/05/24 0348 129/76 97.9 F (36.6 C) Oral 99 18 91 % --  05/04/24 2239 138/87 98 F (36.7 C) Oral (!) 102 18 95 % --  05/04/24 2200 134/81 -- -- 99 (!) 22 97 % --  05/04/24 2000 (!) 149/88 97.7 F (36.5 C) Oral 100 (!) 22 99 % --  05/04/24 1900 135/88 -- -- 99 18 98 % --  05/04/24 1800 127/89 -- -- (!) 107 (!) 22 94 % --  05/04/24 1700 112/82 -- -- 98 20 93 % --  05/04/24 1600 121/73 97.6 F (36.4 C) Oral (!) 102 16 95 % --  05/04/24 1500 133/74 -- -- (!) 101 20 98 % --  05/04/24 1400 137/75 -- -- 100 15 98 % --  05/04/24 1300 136/88 -- -- 78 18 92 % --  05/04/24 1200 (!) 163/87 98.2 F (36.8 C) Oral -- (!) 29 96 % --       PHYSICAL EXAM:  General: Alert, cooperative, ,nasal cannula oxygen Lungs: b/l air entry- crepts bases Heart: Tachycardia Abdomen: Soft, non-tender,not distended. Bowel sounds normal. No masses Extremities: atraumatic, no cyanosis. No edema. No clubbing Skin: No rashes or lesions. Or bruising Lymph: Cervical, supraclavicular normal. Neurologic: Grossly non-focal  Lab Results    Latest Ref Rng & Units 05/05/2024   12:17 AM  05/04/2024    3:08 AM 05/03/2024    6:28 AM  CBC  WBC 4.0 - 10.5 K/uL 17.3  13.3  10.7   Hemoglobin 13.0 - 17.0 g/dL 9.6  9.3  8.6   Hematocrit 39.0 - 52.0 % 29.4  28.7  27.2   Platelets 150 - 400 K/uL 57  64  96        Latest Ref Rng & Units 05/05/2024   12:17 AM 05/04/2024    7:12 AM 05/04/2024    3:08 AM  CMP  Glucose 70 - 99 mg/dL 756  433  295   BUN 8 - 23 mg/dL 84  64  63   Creatinine 0.61 - 1.24 mg/dL 1.88  4.16  6.06   Sodium 135 - 145 mmol/L 138  139  139   Potassium 3.5 - 5.1 mmol/L 4.5  4.4  5.6   Chloride 98 - 111 mmol/L 108  107  108   CO2 22 - 32 mmol/L 21  19  20    Calcium 8.9 - 10.3 mg/dL 8.2  8.2  8.5       Microbiology: 5/20 both sets blood culture positive  for Ecoli UC 5/21Margrett Sherry Studies/Results: DG Chest Port 1 View Result Date: 05/04/2024 CLINICAL DATA:  Shortness of breath. EXAM: PORTABLE CHEST 1 VIEW COMPARISON:  May 02, 2024. FINDINGS: The heart size and mediastinal contours are within normal limits. Increased bilateral perihilar and basilar opacities are noted concerning for pulmonary edema with possible small right pleural effusion. The visualized skeletal structures are unremarkable. IMPRESSION: Increased bilateral perihilar and basilar opacities are noted concerning for pulmonary edema with possible small right pleural effusion. Electronically Signed   By: Rosalene Colon M.D.   On: 05/04/2024 14:19     Assessment/Plan: Ecoli bacteremia with ecoli UTI- on ceftriaxone- pan sensitive organism Leucocytosis likely due to steroids With worsening cr a CT was done and it does not show any pathology in the transplant kidney Repeat Blood culture sent Continue Ceftriaxone B/l pleural effusion left > rt  Worsening renal function- Nephrology on board  Renal transplant Currently on steroids,.  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

## 2024-05-05 NOTE — TOC Initial Note (Signed)
 Transition of Care [facility]) - Initial/Assessment Note    Patient Details  Name: Rick Wang MRN: [id number] Date of Birth: 12/31/1955  Transition of [facility]) CM/SW Contact:    Loman Risk, RN Phone Number: 05/05/2024, 2:45 PM  Clinical Narrative:                 Met with patient and wife at bedside  Admitted WGN:FAOZHY Admitted from: home with wife PCP: Oletta Berry Current home health/prior home health/DME: NA  Therapy recommending out patient therapy, RW and BSC Patient/wife prefers [facility] outpatient in [place], Outpatient order form faxed.  Per MD patient patient not medically ready for discharge.  TOC to order [facility] and RW closer to [place]         Patient Goals and CMS Choice            Expected Discharge Plan and Services                                              Prior Living Arrangements/Services                       Activities of Daily Living   ADL Screening (condition at time of admission) Independently performs ADLs?: Yes (appropriate for developmental age) Is the patient deaf or have difficulty hearing?: No Does the patient have difficulty seeing, even when wearing glasses/contacts?: No Does the patient have difficulty concentrating, remembering, or making decisions?: No  Permission Sought/Granted                  Emotional Assessment              Admission diagnosis:  Lower urinary tract infectious disease [N39.0] Body aches [R52] AKI (acute kidney injury) (HCC) [N17.9] Septic shock (HCC) [A41.9, R65.21] Severe sepsis (HCC) [A41.9, R65.20] Hypotension, unspecified hypotension type [I95.9] Sepsis, due to unspecified organism, unspecified whether acute organ dysfunction present [facility]) [A41.9] Patient Active Problem List   Diagnosis Date Noted   Severe sepsis (HCC) 05/03/2024   Acute non-recurrent pansinusitis 11/25/2022   Lymphedema 05/31/2017   Chronic venous insufficiency 05/31/2017   Special screening for  malignant neoplasms, colon    Type I diabetes mellitus (HCC) 02/03/2016   Diabetic retinopathy (HCC) 02/03/2016   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/2016   End-stage renal disease (HCC) 02/03/2016   Glaucoma 02/03/2016   HLD (hyperlipidemia) 02/03/2016   Kidney failure 02/03/2016   Basal cell carcinoma of back 02/25/2015   H/O thrombosis 02/12/2012   BP (high blood pressure) 01/13/2011   H/O kidney transplant 07/08/2010   PCP:  Nikki Barters, MD Pharmacy:   [facility] - [place], [place] - [address] [place] [address] Phone: [phone] Fax: [phone]  [facility] [address] [place] - [address] [place] [address] Phone: [phone] Fax: [phone]  [id number] - [place], [place] - [address] AT [facility] [address] [facility] [address] [place] [address] Phone: [phone] Fax: [phone]     Social Drivers of Health (SDOH) Social History: SDOH Screenings   Food Insecurity: No Food Insecurity (05/03/2024)  Housing: Low Risk  (05/03/2024)  Transportation Needs: No Transportation Needs (05/03/2024)  Utilities: Not At Risk (05/03/2024)  Alcohol Screen: Low Risk  (  11/25/2022)  Depression (PHQ2-9): Medium Risk (11/25/2022)  Financial Resource Strain: Low Risk  (07/01/2023)   Received from [facility]  Physical Activity: Sufficiently Active (07/01/2023)   [facility] from [facility]  Social Connections: Socially Integrated (05/03/2024)  Tobacco Use: Low Risk  (05/02/2024)  Health Literacy: Adequate Health Literacy (07/01/2023)   Received from [facility]   SDOH Interventions:     Readmission Risk Interventions     No data to display

## 2024-05-05 NOTE — Progress Notes (Signed)
 Speech Language Pathology Treatment: Dysphagia  Patient Details Name: Rick Wang MRN: [id number] DOB: [birth year] Today's Date: 05/05/2024 Time: 1430-1500 SLP Time Calculation (min) (ACUTE ONLY): 30 min  Assessment / Plan / Recommendation Clinical Impression  Pt seen for ongoing assessment of toleration of rec'd oral diet/consistency; toleration of Pill swallowing. F/u also for Education for pt/Family re: any REFLUX/Esophageal phase Dysmotility precautions and general aspiration precautions.  Education provided to pt/Family on: general REFLUX precautions and Esophageal phase strategies to aid motility; general aspiration precautions; diet consistency and options/prep to aid Esophageal clearing/swallowing. Brief discussion w/ Family of pt's Dx'd EoE as per chart(EGD at [facility] 11/2023) and need to f/u w/ GI post this admit.   NSG and Family/pt reported good toleration of current Mech Soft diet, thins w/ no overt clinical s/s of aspiration noted. Recommend continue this current diet w/ general precautions as discussed. No further skilled ST services indicated acutely. Recommend f/u w/ GI post Discharge. Family/pt agreed. MD/NSG updated, agreed.         HPI HPI: Per H&P, Rick Wang is 68 year old with a PMH significant HTN, HLD, Diabetes type 1, Renal transplant 2012, CKD, DVT, AND PER EGD 11/2023 at [facility]: Eosinophilic esophagitis (EoE) with resulting dysphagia that presented 5/20 to the ER with complaints of Fever and syncope. To review, patient received Hep B vaccine Monday, on Tuesday developed Malaise, poor appetite, fever, nausea, confusion. Wife states that he was standing and his legs gave out, she assisted him to the ground.  With ongoing hypotension he was given and additional 1 liter (3 Liters total) and started on Levophed infusion. He is now admitted to the ICU for further management of his septic shock.    CXR at Admit:  No active disease. Since admit, CXR yesterday revealed "Increased  bilateral perihilar and basilar opacities are noted  concerning for pulmonary edema".      SLP Plan  All goals met      Recommendations for follow up therapy are one component of a multi-disciplinary discharge planning process, led by the attending physician.  Recommendations may be updated based on patient status, additional functional criteria and insurance authorization.    Recommendations  Diet recommendations: Dysphagia 3 (mechanical soft);Thin liquid (for moist, cut foods d/t Esophageal phase Dysmotility) Liquids provided via: Cup (less straw) Medication Administration: Whole meds with liquid (vs WHole in Puree if needed) Supervision: Patient able to self feed (setup) Compensations: Minimize environmental distractions;Slow rate;Small sips/bites;Follow solids with liquid Postural Changes and/or Swallow Maneuvers: Out of bed for meals;Seated upright 90 degrees;Upright 30-60 min after meal (REFLUX precs.)                 (GI f/u for EoE and REFLUX management; PPI) Oral care BID;Patient independent with oral care   PRN Dysphagia, pharyngoesophageal phase (R13.14) (Esophageal phase Dysmotility -- EoE)     All goals met       Darla Edward, MS, CCC-SLP Speech Language Pathologist Rehab Services; [facility] [place] [phone] (ascom) Reford Olliff  05/05/2024, 3:10 PM

## 2024-05-06 DIAGNOSIS — A419 Sepsis, unspecified organism: Secondary | ICD-10-CM | POA: Diagnosis not present

## 2024-05-06 DIAGNOSIS — R6520 Severe sepsis without septic shock: Secondary | ICD-10-CM | POA: Diagnosis not present

## 2024-05-06 LAB — BASIC METABOLIC PANEL WITH GFR
Anion gap: 13 (ref 5–15)
BUN: 105 mg/dL — ABNORMAL HIGH (ref 8–23)
CO2: 18 mmol/L — ABNORMAL LOW (ref 22–32)
Calcium: 9.1 mg/dL (ref 8.9–10.3)
Chloride: 107 mmol/L (ref 98–111)
Creatinine, Ser: 3.38 mg/dL — ABNORMAL HIGH (ref 0.61–1.24)
GFR, Estimated: 19 mL/min — ABNORMAL LOW (ref >60)
Glucose, Bld: 120 mg/dL — ABNORMAL HIGH (ref 70–99)
Potassium: 4.3 mmol/L (ref 3.5–5.1)
Sodium: 138 mmol/L (ref 135–145)

## 2024-05-06 LAB — CBC
HCT: 30.5 % — ABNORMAL LOW (ref 39.0–52.0)
Hemoglobin: 10.1 g/dL — ABNORMAL LOW (ref 13.0–17.0)
MCH: 30.5 pg (ref 26.0–34.0)
MCHC: 33.1 g/dL (ref 30.0–36.0)
MCV: 92.1 fL (ref 80.0–100.0)
Platelets: 39 10*3/uL — ABNORMAL LOW (ref 150–400)
RBC: 3.31 MIL/uL — ABNORMAL LOW (ref 4.22–5.81)
RDW: 13.7 % (ref 11.5–15.5)
WBC: 21.2 10*3/uL — ABNORMAL HIGH (ref 4.0–10.5)
nRBC: 0 % (ref 0.0–0.2)

## 2024-05-06 LAB — GLUCOSE, CAPILLARY
Glucose-Capillary: 106 mg/dL — ABNORMAL HIGH (ref 70–99)
Glucose-Capillary: 135 mg/dL — ABNORMAL HIGH (ref 70–99)
Glucose-Capillary: 187 mg/dL — ABNORMAL HIGH (ref 70–99)
Glucose-Capillary: 204 mg/dL — ABNORMAL HIGH (ref 70–99)
Glucose-Capillary: 188 mg/dL — ABNORMAL HIGH (ref 70–99)

## 2024-05-06 LAB — MAGNESIUM: Magnesium: 2.4 mg/dL (ref 1.7–2.4)

## 2024-05-06 LAB — PHOSPHORUS: Phosphorus: 5.5 mg/dL — ABNORMAL HIGH (ref 2.5–4.6)

## 2024-05-06 MED ORDER — HYDROCORTISONE SOD SUC (PF) 100 MG IJ SOLR
100.0000 mg | Freq: Two times a day (BID) | INTRAMUSCULAR | Status: AC
  Administered 2024-05-07 (×2): 100 mg via INTRAVENOUS
  Filled 2024-05-06 (×2): qty 2

## 2024-05-06 MED ORDER — HYDROCORTISONE SOD SUC (PF) 100 MG IJ SOLR
50.0000 mg | Freq: Two times a day (BID) | INTRAMUSCULAR | Status: AC
  Administered 2024-05-08 (×2): 50 mg via INTRAVENOUS
  Filled 2024-05-06 (×2): qty 1

## 2024-05-06 MED ORDER — SODIUM BICARBONATE 8.4 % IV SOLN
INTRAVENOUS | Status: AC
Start: 2024-05-06 — End: 2024-05-07
  Filled 2024-05-06: qty 50

## 2024-05-06 MED ORDER — MYCOPHENOLATE SODIUM 180 MG PO TBEC
360.0000 mg | DELAYED_RELEASE_TABLET | Freq: Two times a day (BID) | ORAL | Status: DC
  Administered 2024-05-06 – 2024-05-11 (×11): 360 mg via ORAL
  Filled 2024-05-06 (×11): qty 2

## 2024-05-06 MED ORDER — SODIUM BICARBONATE 650 MG PO TABS
650.0000 mg | ORAL_TABLET | Freq: Three times a day (TID) | ORAL | Status: DC
  Administered 2024-05-07: 650 mg via ORAL
  Filled 2024-05-06: qty 1

## 2024-05-06 MED ORDER — SODIUM BICARBONATE 8.4 % IV SOLN
100.0000 meq | Freq: Once | INTRAVENOUS | Status: AC
  Administered 2024-05-06: 100 meq via INTRAVENOUS
  Filled 2024-05-06: qty 50
  Filled 2024-05-06: qty 100

## 2024-05-06 NOTE — Progress Notes (Signed)
 [facility]  ROUNDING NOTE   Subjective:   Patient sitting up in bed, no acute distress noted. Wife at bedside. No complaints to offer.   Creat 3.38 Objective:  Vital signs in last 24 hours:  Temp:  [97.6 F (36.4 C)-98.1 F (36.7 C)] 97.6 F (36.4 C) (05/24 0851) Pulse Rate:  [76-100] 86 (05/24 0851) Resp:  [16-17] 17 (05/24 0851) BP: (126-161)/(86-95) 144/88 (05/24 0851) SpO2:  [97 %-99 %] 97 % (05/24 0851) Weight:  [73.2 kg] 73.2 kg (05/24 0300)  Weight change: 3.1 kg Filed Weights   05/04/24 0500 05/05/24 0500 05/06/24 0300  Weight: 63.8 kg 70.1 kg 73.2 kg    Intake/Output: I/O last 3 completed shifts: In: 666.5 [P.O.:660; IV Piggyback:6.5] Out: 1100 [Urine:1100]   Intake/Output this shift:  Total I/O In: -  Out: 300 [Urine:300]  Physical Exam: General: NAD,   Head: Normocephalic, atraumatic. Moist oral mucosal membranes  Eyes: Anicteric, PERRL  Neck: Supple, trachea midline  Lungs:  2L [place], clear to auscultation  Heart: Regular rate and rhythm  Abdomen:  Soft, nontender,   Extremities:  No peripheral edema.  Neurologic: Nonfocal, moving all four extremities  Skin: No lesions  Access: None    Basic Metabolic Panel: Recent Labs  Lab 05/03/24 0628 05/04/24 0308 05/04/24 0712 05/05/24 0017 05/06/24 0452  NA 137 139 139 138 138  K 3.7 5.6* 4.4 4.5 4.3  CL 107 108 107 108 107  CO2 16* 20* 19* 21* 18*  GLUCOSE 157* 147* 124* 188* 120*  BUN 44* 63* 64* 84* 105*  CREATININE 2.73* 3.76* 3.77* 3.90* 3.38*  CALCIUM 8.1* 8.5* 8.2* 8.2* 9.1  MG 1.0* 2.1  --  2.3 2.4  PHOS 2.4* 5.8*  --  5.8* 5.5*    Liver Function Tests: Recent Labs  Lab 05/02/24 2157  AST 42*  ALT 25  ALKPHOS 64  BILITOT 1.3*  PROT 6.4*  ALBUMIN 3.3*   No results for input(s): "LIPASE", "AMYLASE" in the last 168 hours. No results for input(s): "AMMONIA" in the last 168 hours.  CBC: Recent Labs  Lab 05/02/24 2157 05/03/24 0628 05/04/24 0308 05/05/24 0017  05/06/24 0452  WBC 1.5* 10.7* 13.3* 17.3* 21.2*  NEUTROABS 0.9*  --   --   --   --   HGB 10.3* 8.6* 9.3* 9.6* 10.1*  HCT 33.8* 27.2* 28.7* 29.4* 30.5*  MCV 101.5* 98.6 96.6 95.1 92.1  PLT 112* 96* 64* 57* 39*    Cardiac Enzymes: No results for input(s): "CKTOTAL", "CKMB", "CKMBINDEX", "TROPONINI" in the last 168 hours.  BNP: Invalid input(s): "POCBNP"  CBG: Recent Labs  Lab 05/05/24 1732 05/05/24 2027 05/05/24 2327 05/06/24 0320 05/06/24 0848  GLUCAP 148* 155* 112* 106* 135*    Microbiology: Results for orders placed or performed during the hospital encounter of 05/02/24  Culture, blood (Routine x 2)     Status: Abnormal   Collection Time: 05/02/24  9:57 PM   Specimen: BLOOD  Result Value Ref Range Status   Specimen Description   Final    BLOOD BLOOD RIGHT ARM Performed at [facility], [address] [place], [place] [address]    Special Requests   Final    BOTTLES DRAWN AEROBIC AND ANAEROBIC Blood Culture adequate volume Performed at [facility], [address], [place], [place] [address]    Culture  Setup Time   Final    GRAM NEGATIVE RODS IN BOTH AEROBIC AND ANAEROBIC BOTTLES CRITICAL VALUE NOTED.  VALUE IS CONSISTENT  WITH PREVIOUSLY REPORTED AND CALLED VALUE. Performed at [facility], [address] [place], [place] [address]    Culture (A)  Final    ESCHERICHIA COLI SUSCEPTIBILITIES PERFORMED ON PREVIOUS CULTURE WITHIN THE LAST 5 DAYS. Performed at [facility], [address] [place], [place] [address]    Report Status 05/05/2024 FINAL  Final  Culture, blood (Routine x 2)     Status: Abnormal   Collection Time: 05/02/24  9:58 PM   Specimen: BLOOD  Result Value Ref Range Status   Specimen Description   Final    BLOOD BLOOD RIGHT ARM Performed at [facility], [address] [place], [place] [address]    Special Requests   Final    BOTTLES DRAWN AEROBIC AND ANAEROBIC Blood Culture adequate  volume Performed at [facility], [address] [place], [place] [address]    Culture  Setup Time   Final    GRAM NEGATIVE RODS IN BOTH AEROBIC AND ANAEROBIC BOTTLES CRITICAL RESULT CALLED TO, READ BACK BY AND VERIFIED WITH: SHEEMA HALLAJI PHARMD 1020 05/03/24 HNM    Culture ESCHERICHIA COLI (A)  Final   Report Status 05/05/2024 FINAL  Final   Organism ID, Bacteria ESCHERICHIA COLI  Final   Organism ID, Bacteria ESCHERICHIA COLI  Final      Susceptibility   Escherichia coli - KIRBY BAUER*    CEFAZOLIN SENSITIVE Sensitive    Escherichia coli - MIC*    AMPICILLIN 4 SENSITIVE Sensitive     CEFEPIME <=0.12 SENSITIVE Sensitive     CEFTAZIDIME <=1 SENSITIVE Sensitive     CEFTRIAXONE <=0.25 SENSITIVE Sensitive     CIPROFLOXACIN <=0.25 SENSITIVE Sensitive     GENTAMICIN <=1 SENSITIVE Sensitive     IMIPENEM <=0.25 SENSITIVE Sensitive     TRIMETH/SULFA <=20 SENSITIVE Sensitive     AMPICILLIN/SULBACTAM <=2 SENSITIVE Sensitive     PIP/TAZO <=4 SENSITIVE Sensitive ug/mL    * ESCHERICHIA COLI    ESCHERICHIA COLI  Blood Culture ID Panel (Reflexed)     Status: Abnormal   Collection Time: 05/02/24  9:58 PM  Result Value Ref Range Status   Enterococcus faecalis NOT DETECTED NOT DETECTED Final   Enterococcus Faecium NOT DETECTED NOT DETECTED Final   Listeria monocytogenes NOT DETECTED NOT DETECTED Final   Staphylococcus species NOT DETECTED NOT DETECTED Final   Staphylococcus aureus (BCID) NOT DETECTED NOT DETECTED Final   Staphylococcus epidermidis NOT DETECTED NOT DETECTED Final   Staphylococcus lugdunensis NOT DETECTED NOT DETECTED Final   Streptococcus species NOT DETECTED NOT DETECTED Final   Streptococcus agalactiae NOT DETECTED NOT DETECTED Final   Streptococcus pneumoniae NOT DETECTED NOT DETECTED Final   Streptococcus pyogenes NOT DETECTED NOT DETECTED Final   A.calcoaceticus-baumannii NOT DETECTED NOT DETECTED Final   Bacteroides fragilis NOT DETECTED NOT DETECTED Final    Enterobacterales DETECTED (A) NOT DETECTED Final    Comment: Enterobacterales represent a large order of gram negative bacteria, not a single organism. CRITICAL RESULT CALLED TO, READ BACK BY AND VERIFIED WITH: SHEEMA HALLAJI PHARMD 1020 05/03/24 HNM    Enterobacter cloacae complex NOT DETECTED NOT DETECTED Final   Escherichia coli DETECTED (A) NOT DETECTED Final    Comment: CRITICAL RESULT CALLED TO, READ BACK BY AND VERIFIED WITH: SHEEMA HALLAJI PHARMD 1020 05/03/24 HNM    Klebsiella aerogenes NOT DETECTED NOT DETECTED Final   Klebsiella oxytoca NOT DETECTED NOT DETECTED Final   Klebsiella pneumoniae NOT DETECTED NOT DETECTED Final   Proteus species NOT DETECTED  NOT DETECTED Final   Salmonella species NOT DETECTED NOT DETECTED Final   Serratia marcescens NOT DETECTED NOT DETECTED Final   Haemophilus influenzae NOT DETECTED NOT DETECTED Final   Neisseria meningitidis NOT DETECTED NOT DETECTED Final   Pseudomonas aeruginosa NOT DETECTED NOT DETECTED Final   Stenotrophomonas maltophilia NOT DETECTED NOT DETECTED Final   Candida albicans NOT DETECTED NOT DETECTED Final   Candida auris NOT DETECTED NOT DETECTED Final   Candida glabrata NOT DETECTED NOT DETECTED Final   Candida krusei NOT DETECTED NOT DETECTED Final   Candida parapsilosis NOT DETECTED NOT DETECTED Final   Candida tropicalis NOT DETECTED NOT DETECTED Final   Cryptococcus neoformans/gattii NOT DETECTED NOT DETECTED Final   CTX-M ESBL NOT DETECTED NOT DETECTED Final   Carbapenem resistance IMP NOT DETECTED NOT DETECTED Final   Carbapenem resistance KPC NOT DETECTED NOT DETECTED Final   Carbapenem resistance NDM NOT DETECTED NOT DETECTED Final   Carbapenem resist OXA 48 LIKE NOT DETECTED NOT DETECTED Final   Carbapenem resistance VIM NOT DETECTED NOT DETECTED Final    Comment: Performed at [facility], [address] [place], [place] [address]  Resp panel by RT-PCR (RSV, Flu A&B, Covid) Anterior Nasal Swab      Status: None   Collection Time: 05/02/24 11:17 PM   Specimen: Anterior Nasal Swab  Result Value Ref Range Status   SARS Coronavirus 2 by RT PCR NEGATIVE NEGATIVE Final    Comment: (NOTE) SARS-CoV-2 target nucleic acids are NOT DETECTED.  The SARS-CoV-2 RNA is generally detectable in upper respiratory specimens during the acute phase of infection. The lowest concentration of SARS-CoV-2 viral copies this assay can detect is 138 copies/mL. A negative result does not preclude SARS-Cov-2 infection and should not be used as the sole basis for treatment or other patient management decisions. A negative result may occur with  improper specimen collection/handling, submission of specimen other than nasopharyngeal swab, presence of viral mutation(s) within the areas targeted by this assay, and inadequate number of viral copies(<138 copies/mL). A negative result must be combined with clinical observations, patient history, and epidemiological information. The expected result is Negative.  Fact Sheet for Patients:  BloggerCourse.com  Fact Sheet for Healthcare Providers:  SeriousBroker.it  This test is no t yet approved or cleared by the [place]  FDA and  has been authorized for detection and/or diagnosis of SARS-CoV-2 by FDA under an Emergency Use Authorization (EUA). This EUA will remain  in effect (meaning this test can be used) for the duration of the COVID-19 declaration under Section 564(b)(1) of the Act, 21 U.S.C.section 360bbb-3(b)(1), unless the authorization is terminated  or revoked sooner.       Influenza A by PCR NEGATIVE NEGATIVE Final   Influenza B by PCR NEGATIVE NEGATIVE Final    Comment: (NOTE) The Xpert Xpress SARS-CoV-2/FLU/RSV plus assay is intended as an aid in the diagnosis of influenza from Nasopharyngeal swab specimens and should not be used as a sole basis for treatment. Nasal washings and aspirates are  unacceptable for Xpert Xpress SARS-CoV-2/FLU/RSV testing.  Fact Sheet for Patients: BloggerCourse.com  Fact Sheet for Healthcare Providers: SeriousBroker.it  This test is not yet approved or cleared by the [place]  FDA and has been authorized for detection and/or diagnosis of SARS-CoV-2 by FDA under an Emergency Use Authorization (EUA). This EUA will remain in effect (meaning this test can be used) for the duration of the COVID-19 declaration under Section 564(b)(1) of the Act, 21 U.S.C. section 360bbb-3(b)(1), unless the  authorization is terminated or revoked.     Resp Syncytial Virus by PCR NEGATIVE NEGATIVE Final    Comment: (NOTE) Fact Sheet for Patients: BloggerCourse.com  Fact Sheet for Healthcare Providers: SeriousBroker.it  This test is not yet approved or cleared by the [place]  FDA and has been authorized for detection and/or diagnosis of SARS-CoV-2 by FDA under an Emergency Use Authorization (EUA). This EUA will remain in effect (meaning this test can be used) for the duration of the COVID-19 declaration under Section 564(b)(1) of the Act, 21 U.S.C. section 360bbb-3(b)(1), unless the authorization is terminated or revoked.  Performed at [facility], [address] [place], [place] [address]   Urine Culture     Status: Abnormal   Collection Time: 05/03/24 12:18 AM   Specimen: Urine, Random  Result Value Ref Range Status   Specimen Description   Final    URINE, RANDOM Performed at [facility], [address] [place], [place] [address]    Special Requests   Final    NONE Reflexed from [id number] Performed at [facility], [address] [place], [place] [address]    Culture >=100,000 COLONIES/mL ESCHERICHIA COLI (A)  Final   Report Status 05/05/2024 FINAL  Final   Organism ID, Bacteria ESCHERICHIA COLI (A)  Final       Susceptibility   Escherichia coli - MIC*    AMPICILLIN 4 SENSITIVE Sensitive     CEFAZOLIN <=4 SENSITIVE Sensitive     CEFEPIME  <=0.12 SENSITIVE Sensitive     CEFTRIAXONE  <=0.25 SENSITIVE Sensitive     CIPROFLOXACIN <=0.25 SENSITIVE Sensitive     GENTAMICIN <=1 SENSITIVE Sensitive     IMIPENEM <=0.25 SENSITIVE Sensitive     NITROFURANTOIN <=16 SENSITIVE Sensitive     TRIMETH/SULFA <=20 SENSITIVE Sensitive     AMPICILLIN/SULBACTAM <=2 SENSITIVE Sensitive     PIP/TAZO <=4 SENSITIVE Sensitive ug/mL    * >=100,000 COLONIES/mL ESCHERICHIA COLI  MRSA Next Gen by PCR, Nasal     Status: Abnormal   Collection Time: 05/03/24  4:00 AM   Specimen: Nasal Mucosa; Nasal Swab  Result Value Ref Range Status   MRSA by PCR Next Gen DETECTED (A) NOT DETECTED Final    Comment: RESULT CALLED TO, READ BACK BY AND VERIFIED WITH: Salina Craver RN [phone] 05/03/24 HNM (NOTE) The GeneXpert MRSA Assay (FDA approved for NASAL specimens only), is one component of a comprehensive MRSA colonization surveillance program. It is not intended to diagnose MRSA infection nor to guide or monitor treatment for MRSA infections. Test performance is not FDA approved in patients less than 28 years old. Performed at [facility], [address] [place], [place] [address]   Culture, blood (Routine X 2) w Reflex to ID Panel     Status: None (Preliminary result)   Collection Time: 05/05/24 12:17 AM   Specimen: BLOOD RIGHT ARM  Result Value Ref Range Status   Specimen Description BLOOD RIGHT ARM  Final   Special Requests   Final    BOTTLES DRAWN AEROBIC AND ANAEROBIC Blood Culture adequate volume   Culture   Final    NO GROWTH 1 DAY Performed at [facility], [address], [place], [place] [address]    Report Status PENDING  Incomplete  Culture, blood (Routine X 2) w Reflex to ID Panel     Status: None (Preliminary result)   Collection Time: 05/05/24 12:17 AM   Specimen: BLOOD LEFT HAND  Result Value  Ref Range Status  Specimen Description BLOOD LEFT HAND  Final   Special Requests   Final    BOTTLES DRAWN AEROBIC AND ANAEROBIC Blood Culture adequate volume   Culture   Final    NO GROWTH 1 DAY Performed at [facility], [address] [place], [place] [address]    Report Status PENDING  Incomplete    Coagulation Studies: No results for input(s): "LABPROT", "INR" in the last 72 hours.  Urinalysis: No results for input(s): "COLORURINE", "LABSPEC", "PHURINE", "GLUCOSEU", "HGBUR", "BILIRUBINUR", "KETONESUR", "PROTEINUR", "UROBILINOGEN", "NITRITE", "LEUKOCYTESUR" in the last 72 hours.  Invalid input(s): "APPERANCEUR"    Imaging: CT ABDOMEN PELVIS WO CONTRAST Result Date: 05/05/2024 CLINICAL DATA:  Abdominal pain, acute, nonlocalized; Pleural effusion, malignancy suspected. EXAM: CT CHEST, ABDOMEN AND PELVIS WITHOUT CONTRAST TECHNIQUE: Multidetector CT imaging of the chest, abdomen and pelvis was performed following the standard protocol without IV contrast. RADIATION DOSE REDUCTION: This exam was performed according to the departmental dose-optimization program which includes automated exposure control, adjustment of the mA and/or kV according to patient size and/or use of iterative reconstruction technique. COMPARISON:  None Available. FINDINGS: CT CHEST FINDINGS Cardiovascular: Normal cardiac size. no pericardial effusion. No aortic aneurysm. There are coronary artery calcifications, in keeping with coronary artery disease. Mediastinum/Nodes: Visualized thyroid  gland appears grossly unremarkable. No solid / cystic mediastinal masses. The esophagus is nondistended precluding optimal assessment. No mediastinal or axillary lymphadenopathy by size criteria. Evaluation of bilateral hila is limited due to lack on intravenous contrast: however, no large hilar lymphadenopathy identified. Lungs/Pleura: The central tracheo-bronchial tree is patent. There is small left and small-to-moderate  right pleural effusion with associated compressive atelectatic changes in the bilateral lower lobes. There are additional patchy areas of linear, plate-like atelectasis and/or scarring throughout bilateral lungs. No mass, consolidation or pneumothorax. No suspicious lung nodules. There multiple sub 4 mm calcified granulomas scattered throughout bilateral lungs. Musculoskeletal: The visualized soft tissues of the chest wall are grossly unremarkable. No suspicious osseous lesions. There are mild multilevel degenerative changes in the visualized spine. CT ABDOMEN PELVIS FINDINGS Hepatobiliary: The liver is normal in size. Non-cirrhotic configuration. No suspicious mass. No intrahepatic or extrahepatic bile duct dilation. No calcified gallstones. Normal gallbladder wall thickness. No pericholecystic inflammatory changes. Pancreas: Unremarkable. No pancreatic ductal dilatation or surrounding inflammatory changes. Spleen: Within normal limits. No focal lesion. Adrenals/Urinary Tract: Adrenal glands are unremarkable. Markedly small/atrophic bilateral kidneys noted with extensive vascular calcifications. No nephroureterolithiasis or obstructive uropathy. There is implanted right pelvic kidney noted. No nephroureterolithiasis or obstructive uropathy. Unremarkable urinary bladder. Stomach/Bowel: There is a small diverticulum arising from the second part of duodenum. No disproportionate dilation of the small or large bowel loops. No evidence of abnormal bowel wall thickening or inflammatory changes. The appendix is unremarkable. There are multiple diverticula mainly in the left hemi colon, without imaging signs of diverticulitis. Vascular/Lymphatic: No ascites or pneumoperitoneum. No abdominal or pelvic lymphadenopathy, by size criteria. No aneurysmal dilation of the major abdominal arteries. There are mild peripheral atherosclerotic vascular calcifications of the aorta and its major branches. Reproductive: Normal size  prostate. Symmetric seminal vesicles. Other: There is a small fat containing umbilical hernia. There is tiny fat containing right inguinal hernia and small left inguinal hernia containing fat, ascitic fluid and portion of unobstructed sigmoid colon. There is mild anasarca the soft tissues and abdominal wall are otherwise unremarkable. Musculoskeletal: No suspicious osseous lesions. There are mild multilevel degenerative changes in the visualized spine. IMPRESSION: 1. There is small left and small-to-moderate right pleural effusion with  associated compressive atelectatic changes in the bilateral lower lobes. No lung mass, consolidation or pneumothorax. No suspicious lung nodule. 2. No acute inflammatory process identified within the abdomen or pelvis. 3. Multiple other nonacute observations, as described above. Aortic Atherosclerosis (ICD10-I70.0). Electronically Signed   By: Beula Brunswick M.D.   On: 05/05/2024 15:01   CT CHEST WO CONTRAST Result Date: 05/05/2024 CLINICAL DATA:  Abdominal pain, acute, nonlocalized; Pleural effusion, malignancy suspected. EXAM: CT CHEST, ABDOMEN AND PELVIS WITHOUT CONTRAST TECHNIQUE: Multidetector CT imaging of the chest, abdomen and pelvis was performed following the standard protocol without IV contrast. RADIATION DOSE REDUCTION: This exam was performed according to the departmental dose-optimization program which includes automated exposure control, adjustment of the mA and/or kV according to patient size and/or use of iterative reconstruction technique. COMPARISON:  None Available. FINDINGS: CT CHEST FINDINGS Cardiovascular: Normal cardiac size. no pericardial effusion. No aortic aneurysm. There are coronary artery calcifications, in keeping with coronary artery disease. Mediastinum/Nodes: Visualized thyroid  gland appears grossly unremarkable. No solid / cystic mediastinal masses. The esophagus is nondistended precluding optimal assessment. No mediastinal or axillary  lymphadenopathy by size criteria. Evaluation of bilateral hila is limited due to lack on intravenous contrast: however, no large hilar lymphadenopathy identified. Lungs/Pleura: The central tracheo-bronchial tree is patent. There is small left and small-to-moderate right pleural effusion with associated compressive atelectatic changes in the bilateral lower lobes. There are additional patchy areas of linear, plate-like atelectasis and/or scarring throughout bilateral lungs. No mass, consolidation or pneumothorax. No suspicious lung nodules. There multiple sub 4 mm calcified granulomas scattered throughout bilateral lungs. Musculoskeletal: The visualized soft tissues of the chest wall are grossly unremarkable. No suspicious osseous lesions. There are mild multilevel degenerative changes in the visualized spine. CT ABDOMEN PELVIS FINDINGS Hepatobiliary: The liver is normal in size. Non-cirrhotic configuration. No suspicious mass. No intrahepatic or extrahepatic bile duct dilation. No calcified gallstones. Normal gallbladder wall thickness. No pericholecystic inflammatory changes. Pancreas: Unremarkable. No pancreatic ductal dilatation or surrounding inflammatory changes. Spleen: Within normal limits. No focal lesion. Adrenals/Urinary Tract: Adrenal glands are unremarkable. Markedly small/atrophic bilateral kidneys noted with extensive vascular calcifications. No nephroureterolithiasis or obstructive uropathy. There is implanted right pelvic kidney noted. No nephroureterolithiasis or obstructive uropathy. Unremarkable urinary bladder. Stomach/Bowel: There is a small diverticulum arising from the second part of duodenum. No disproportionate dilation of the small or large bowel loops. No evidence of abnormal bowel wall thickening or inflammatory changes. The appendix is unremarkable. There are multiple diverticula mainly in the left hemi colon, without imaging signs of diverticulitis. Vascular/Lymphatic: No ascites or  pneumoperitoneum. No abdominal or pelvic lymphadenopathy, by size criteria. No aneurysmal dilation of the major abdominal arteries. There are mild peripheral atherosclerotic vascular calcifications of the aorta and its major branches. Reproductive: Normal size prostate. Symmetric seminal vesicles. Other: There is a small fat containing umbilical hernia. There is tiny fat containing right inguinal hernia and small left inguinal hernia containing fat, ascitic fluid and portion of unobstructed sigmoid colon. There is mild anasarca the soft tissues and abdominal wall are otherwise unremarkable. Musculoskeletal: No suspicious osseous lesions. There are mild multilevel degenerative changes in the visualized spine. IMPRESSION: 1. There is small left and small-to-moderate right pleural effusion with associated compressive atelectatic changes in the bilateral lower lobes. No lung mass, consolidation or pneumothorax. No suspicious lung nodule. 2. No acute inflammatory process identified within the abdomen or pelvis. 3. Multiple other nonacute observations, as described above. Aortic Atherosclerosis (ICD10-I70.0). Electronically Signed   By: Beula Brunswick  M.D.   On: 05/05/2024 15:01   DG Chest Port 1 View Result Date: 05/04/2024 CLINICAL DATA:  Shortness of breath. EXAM: PORTABLE CHEST 1 VIEW COMPARISON:  May 02, 2024. FINDINGS: The heart size and mediastinal contours are within normal limits. Increased bilateral perihilar and basilar opacities are noted concerning for pulmonary edema with possible small right pleural effusion. The visualized skeletal structures are unremarkable. IMPRESSION: Increased bilateral perihilar and basilar opacities are noted concerning for pulmonary edema with possible small right pleural effusion. Electronically Signed   By: Rosalene Colon M.D.   On: 05/04/2024 14:19     Medications:    cefTRIAXone (ROCEPHIN)  IV Stopped (05/05/24 1225)    sodium bicarbonate       brimonidine  1 drop  Right Eye TID   brinzolamide  1 drop Right Eye TID   Chlorhexidine Gluconate Cloth  6 each Topical Daily   heparin injection (subcutaneous)  5,000 Units Subcutaneous Q8H   hydrocortisone sod succinate (SOLU-CORTEF) inj  100 mg Intravenous Q8H   insulin  aspart  0-9 Units Subcutaneous Q4H   insulin  glargine-yfgn  15 Units Subcutaneous Daily   metoprolol tartrate  25 mg Oral BID   mupirocin ointment  1 Application Nasal BID   mycophenolate  360 mg Oral BID   pantoprazole  40 mg Oral BID   [START ON 05/07/2024] sodium bicarbonate  650 mg Oral TID   tacrolimus   0.5 mg Oral Q1200   tacrolimus   1 mg Oral Q1200   tamsulosin  0.4 mg Oral QPC supper   sodium bicarbonate, acetaminophen, docusate sodium, fentaNYL (SUBLIMAZE) injection, ondansetron (ZOFRAN) IV, mouth rinse, polyethylene glycol, traZODone  Assessment/ Plan:  Rick Wang is a 68 y.o.  male  with past medical conditions including diabetes, hyperlipidemia, hypertension, chronic kidney disease stage IIIa status post living donor renal transplant 2012, who was admitted to [facility] on 05/02/2024 for Lower urinary tract infectious disease [N39.0] Body aches [R52] AKI (acute kidney injury) (HCC) [N17.9] Septic shock (HCC) [A41.9, R65.21] Severe sepsis (HCC) [A41.9, R65.20] Hypotension, unspecified hypotension type [I95.9] Sepsis, due to unspecified organism, unspecified whether acute organ dysfunction present [facility]) [A41.9]     Acute kidney injury on chronic kidney disease stage III A, status post living donor renal transplant (2012).  Patient currently follows with [facility] nephrology, Dr. Rosebud Confer.  Baseline creatinine appears to be 1.4-1.6.  Creatinine on admission 2.73.  Acute kidney injury likely secondary to concurrent illness, septic shock.   Creatinine and white count reman elevated.  Continue tacrolimus  dismiss at half of home dose, 1 mg in a.m. and 0.5 mg in evening. Will restart Myfortic today. Spoke with Dr Helga Loan, who agrees with treatment  plan. Monitoring CT scan to evaluate if fluid present around kidney.    Lab Results  Component Value Date   CREATININE 3.38 (H) 05/06/2024   CREATININE 3.90 (H) 05/05/2024   CREATININE 3.77 (H) 05/04/2024       Intake/Output Summary (Last 24 hours) at 05/06/2024 1211 Last data filed at 05/06/2024 0852 Gross per 24 hour  Intake 426.45 ml  Output 1400 ml  Net -973.55 ml    2.  Septic shock likely secondary to E. coli bacteremia UTI.  Received cefepime, ceftriaxone, vancomycin, and metronidazole.  Now prescribed ceftriaxone only.  Pressors have been weaned.  ID  consulted.  Critical care to continue management.    3. Anemia of chronic kidney disease Recent Labs       Lab Results  Component Value Date  HGB 10.1 (L) 05/06/2024    Hemoglobin stable.  No need for ESA's at this time.   4. Secondary Hyperparathyroidism: with outpatient labs: None available   Recent Labs       Lab Results  Component Value Date    CALCIUM 9.1 (L) 05/05/2024    PHOS 5.5 (H) 05/06/2024      Will continue to monitor bone minerals at this time.   LOS: 3 Harrel Ferrone Marisa Sickles 5/24/202512:10 PM

## 2024-05-06 NOTE — Progress Notes (Signed)
 Physical Therapy Treatment Patient Details Name: Rick Wang MRN: [id number] DOB: [birth year] Today's Date: 05/06/2024   History of Present Illness 68 y/o male presented to ED on 05/02/24 after being found on floor by EMS. Admitted for septic shock due to UTI. PMH: T1DM, hx of kidney transplant, CKD, DVT, HTN, esophagitis with dysphagia.    PT Comments  Overall, pt demonstrated much improved activity tolerance and safety with mobility, transfers, and gait with RW performing at supervision level . During gait with RW, pt had no LOB. Initially on 2 L O2 93-96%. RA during 2nd lap around nursing station, SPO2 96% HR 77bpm. Notified RN and RN advised to keep pt on RA at end of session. Continued PT will assist pt towards greater dynamic standing balance, LE strengthening, and activity tolerance to increase safety and independence and decrease burden of care with functional mobility.    If plan is discharge home, recommend the following: A little help with walking and/or transfers;A little help with bathing/dressing/bathroom;Assistance with cooking/housework;Direct supervision/assist for medications management;Direct supervision/assist for financial management;Assist for transportation;Help with stairs or ramp for entrance;Supervision due to cognitive status   Can travel by private vehicle        Equipment Recommendations  Rolling walker (2 wheels)    Recommendations for Other Services       Precautions / Restrictions Precautions Precautions: Fall Restrictions Weight Bearing Restrictions Per Provider Order: No     Mobility  Bed Mobility               General bed mobility comments: NT- pt up in recliner pre/post session    Transfers Overall transfer level: Needs assistance Equipment used: Rolling walker (2 wheels) Transfers: Sit to/from Stand, Bed to chair/wheelchair/BSC Sit to Stand: Supervision   Step pivot transfers: Supervision       General transfer comment: increased  time but no LOB    Ambulation/Gait Ambulation/Gait assistance: Supervision Gait Distance (Feet): 200 Feet Assistive device: Rolling walker (2 wheels) Gait Pattern/deviations: Step-through pattern, Decreased stride length, Trunk flexed Gait velocity: decreased     General Gait Details: No LOB.  Initially on 2 L O2 93-96%. RA during 2nd lap around nursing station, SPO2 96% HR 77bpm.  Notified RN and RN advised to keep pt on RA at end of session.   Stairs             Wheelchair Mobility     Tilt Bed    Modified Rankin (Stroke Patients Only)       Balance Overall balance assessment: Needs assistance Sitting-balance support: Feet supported, No upper extremity supported Sitting balance-Leahy Scale: Good     Standing balance support: During functional activity, Single extremity supported Standing balance-Leahy Scale: Good Standing balance comment: supervision with intermittent UE support with dynamic standing balance.                            Communication    Cognition Arousal: Alert Behavior During Therapy: WFL for tasks assessed/performed   PT - Cognitive impairments: Awareness, Problem solving, Safety/Judgement                                Cueing    Exercises      General Comments        Pertinent Vitals/Pain Pain Assessment Pain Assessment: 0-10 Pain Score: 2  Pain Location: abdominal Pain Descriptors / Indicators: Aching Pain Intervention(s):  Monitored during session    Home Living                          Prior Function            PT Goals (current goals can now be found in the care plan section) Acute Rehab PT Goals Patient Stated Goal: did not state PT Goal Formulation: With patient/family Time For Goal Achievement: 05/19/24 Potential to Achieve Goals: Good Progress towards PT goals: Progressing toward goals    Frequency    Min 3X/week      PT Plan      Co-evaluation               AM-PAC PT "6 Clicks" Mobility   Outcome Measure  Help needed turning from your back to your side while in a flat bed without using bedrails?: A Little Help needed moving from lying on your back to sitting on the side of a flat bed without using bedrails?: A Little Help needed moving to and from a bed to a chair (including a wheelchair)?: A Little Help needed standing up from a chair using your arms (e.g., wheelchair or bedside chair)?: A Little Help needed to walk in hospital room?: A Little Help needed climbing 3-5 steps with a railing? : A Little 6 Click Score: 18    End of Session Equipment Utilized During Treatment: Oxygen;Gait belt Activity Tolerance: Patient tolerated treatment well Patient left: in chair;with chair alarm set;with call bell/phone within reach;with family/visitor present Nurse Communication: Mobility status PT Visit Diagnosis: Unsteadiness on feet (R26.81);Muscle weakness (generalized) (M62.81);Difficulty in walking, not elsewhere classified (R26.2)     Time: 1410-1434 PT Time Calculation (min) (ACUTE ONLY): 24 min  Charges:    $Gait Training: 8-22 mins $Therapeutic Activity: 8-22 mins PT General Charges $$ ACUTE PT VISIT: 1 Visit                     Eliazar Gross, PTA  05/06/24, 2:51 PM

## 2024-05-06 NOTE — Plan of Care (Signed)
  Problem: Education: Goal: Knowledge of General Education information will improve Description: Including pain rating scale, medication(s)/side effects and non-pharmacologic comfort measures Outcome: Progressing   Problem: Health Behavior/Discharge Planning: Goal: Ability to manage health-related needs will improve Outcome: Progressing   Problem: Clinical Measurements: Goal: Ability to maintain clinical measurements within normal limits will improve Outcome: Progressing Goal: Will remain free from infection Outcome: Progressing Goal: Diagnostic test results will improve Outcome: Progressing Goal: Respiratory complications will improve Outcome: Progressing Goal: Cardiovascular complication will be avoided Outcome: Progressing   Problem: Activity: Goal: Risk for activity intolerance will decrease Outcome: Progressing   Problem: Nutrition: Goal: Adequate nutrition will be maintained Outcome: Progressing   Problem: Coping: Goal: Level of anxiety will decrease Outcome: Progressing   Problem: Elimination: Goal: Will not experience complications related to bowel motility Outcome: Progressing Goal: Will not experience complications related to urinary retention Outcome: Progressing   Problem: Pain Managment: Goal: General experience of comfort will improve and/or be controlled Outcome: Progressing   Problem: Safety: Goal: Ability to remain free from injury will improve Outcome: Progressing   Problem: Skin Integrity: Goal: Risk for impaired skin integrity will decrease Outcome: Progressing   Problem: Coping: Goal: Ability to adjust to condition or change in health will improve Outcome: Progressing   Problem: Education: Goal: Ability to describe self-care measures that may prevent or decrease complications (Diabetes Survival Skills Education) will improve Outcome: Progressing   Problem: Fluid Volume: Goal: Ability to maintain a balanced intake and output will  improve Outcome: Progressing   Problem: Health Behavior/Discharge Planning: Goal: Ability to identify and utilize available resources and services will improve Outcome: Progressing Goal: Ability to manage health-related needs will improve Outcome: Progressing   Problem: Metabolic: Goal: Ability to maintain appropriate glucose levels will improve Outcome: Progressing   Problem: Nutritional: Goal: Maintenance of adequate nutrition will improve Outcome: Progressing Goal: Progress toward achieving an optimal weight will improve Outcome: Progressing   Problem: Skin Integrity: Goal: Risk for impaired skin integrity will decrease Outcome: Progressing   Problem: Tissue Perfusion: Goal: Adequacy of tissue perfusion will improve Outcome: Progressing

## 2024-05-06 NOTE — Plan of Care (Signed)

## 2024-05-06 NOTE — Progress Notes (Signed)
 [facility] Progress Note  Patient: Rick Wang    ZOX:[id number]  DOA: 05/02/2024     Date of Service: the patient was seen and examined on 05/06/2024  Chief Complaint  Patient presents with   Fever   Near Syncope   Brief hospital course: Yassin Scales is 68 year old with h/o HTN, HLD, Diabetes type 1, Renal transplant 2012, CKD, DVT, Eosinophilic esophagitis with dysphagia that presented 5/20 to the ER with complaints of Fever and syncope. To review, patient received Hep B vaccine Monday, on Tuesday developed Malaise, poor appetite, fever, nausea, confusion. Wife states that he was standing and his legs gave out, she assisted him to the ground. EMS was called, upon arrival to the ER BP 113/92, HR 114, Temp 102.6. Labs revealed chronic Leukopenia 1.5, hgb 10.3, Plts 112, BUN 43, creat 2.07, Lactic acid 5.2, PT 17, INR 1.4, Beta hydroxybutyrate negative, RVP negative. UA+, culture pending, BC x 2, and he was started on Cefepime and Vancomycin. He was given 30 ml/kg IVF bolus. With ongoing hypotension he was given and additional 1 liter (3 Liters total) and started on Levophed infusion. He is now admitted to the ICU for further management of his septic shock.   5/21: Admitted to ICU with septic shock due to UTI. BC x 2, RVP negative, Urine culture pending, Cefepime + Vancomycin. S/p 3 Liters IVF. Starting insulin  drip and discontinuing patients pump.  05/04/24-patient had insomnia overnight, on 2L/min [place].  S/p ID eval.  Remains oliguric and has been seen by nephrology. Has dysphagia and is for SLP.  Remains off vasopressors.  Transferred to [facility] on 5/23  Assessment and Plan:   # Septic Shock secondary to UTI # Immunocompromised, chronic # Lactic Acidosis # Hypotension - s/p  Cefepime + Vancomycin - s/p 3 liters IVFB, and  IVF @ 150 ml/hr, s/p Levophed infusion - due to Persistent hypotension stress dose steroids started in the ICU, started tapering dose 5/21 Urine culture growing E.  coli pansensitive 5/20 blood culture growing E. Coli, pansensitive 5/21 started ceftriaxone 2 g IV daily 5/23 repeat blood culture <12 hr NGTD CT a/p: No acute inflammatory process identified within the abdomen or pelvis. Still elevated WBC count and procalcitonin is elevated ID following   Acute hypoxic respiratory failure secondary to pulmonary edema and pleural effusion secondary to fluid resuscitation CT Chest: small left and small-to-moderate right pleural effusion W/ compressive atelectatic changes in the bilateral lower lobes. No lung mass, consolidation or pneumothorax. No suspicious lung nodule. -Continue supplemental O2 halogen and gradually wean off -Patient will benefit from thoracentesis, which can be done on Monday    Acute on Chronic Kidney Disease  Renal Transplant 2012 @ [facility] - Reach out to Transplant team in AM - Daily BMP - Avoid nephrotoxic agents - held home Mycophenolate and Tacrolimus  on admission - Fluid resuscitation  - Renal US  5/23 as nephro: Will restart tacrolimus  dismiss at half of home dose, 1 mg in a.m. and 0.5 mg in evening.   5/24 restarting Myfortic today. Spoke with Dr Helga Loan, who agrees with treatment plan.  Cr 3.38 improving  Metabolic acidosis secondary to AKI/CKD 5/24 CO2 18, bicarb 100 mEq IV one-time dose given followed by oral supplement. Monitor chemistry daily   # Urinary retention, Foley catheter was inserted on 5/23, around 400 mL urine was collected immediately after Foley catheter insertion Started Flomax Keep Foley for few days and then DC before discharge     # Diabetes Type 1,  utilizes insulin  pump Removed Insulin  pump and Endotool was started in the ICU - Glucose checks per Endotool - Consider endocrine consult   #Hypertension #Hyperlipidemia Antihypertensive medications were held due to sepsis and hypotension Continue to hold statin for now 5/23 BP improved, patient is hypertensive Patient was on amlodipine 5 mg and  lisinopril 40 mg at home Started Lopressor 25 mg p.o. twice daily with holding parameters    # Thrombocytopenia #Anemia Hb 9.6--10.1 Platelets 57>39k - Monitor for bleeding - Daily CBC   #Eosinophilic Esophagitis w/ Dysphagia Patient is s/p recent EGD with biopsy's - Continue home PPI -SLP evaluation done, no oropharyngeal dysphagia.   Body mass index is 23.16 kg/m.  Interventions:  Diet: Dysphagia 3 diet with thin liquids DVT Prophylaxis: Subcutaneous Heparin    Advance goals of care discussion: Full code  Family Communication: family was present at bedside, at the time of interview.  The pt provided permission to discuss medical plan with the family. Opportunity was given to ask question and all questions were answered satisfactorily.   Disposition:  Pt is from home, admitted with sepsis due to UTI, still on IV Abx, which precludes a safe discharge. Discharge to Home, when stable and cleared by ID and nephrology.  Subjective: No significant events overnight, patient still short of breath and needs supplemental O2 in addition.  Denied any worsening of SOB, no chest pain or palpitation, no any other active issues. Patient agreed to try for thoracentesis on Monday   Physical Exam: General: NAD, lying comfortably Appear in no distress, affect appropriate Eyes: PERRLA ENT: Oral Mucosa Clear, moist  Neck: no JVD,  Cardiovascular: S1 and S2 Present, no Murmur,  Respiratory: Equal air entry bilaterally, bibasilar crackles, no wheezes  Abdomen: Bowel Sound present, Soft and no tenderness,  Skin: no rashes Extremities: no Pedal edema, no calf tenderness Neurologic: without any new focal findings Gait not checked due to patient safety concerns  Vitals:   05/06/24 0300 05/06/24 0400 05/06/24 0851 05/06/24 1217  BP:  (!) 143/89 (!) 144/88 107/75  Pulse:  76 86 68  Resp:  16 17 15   Temp:  97.6 F (36.4 C) 97.6 F (36.4 C)   TempSrc:  Oral Oral   SpO2:  99% 97% 98%   Weight: 73.2 kg     Height:        Intake/Output Summary (Last 24 hours) at 05/06/2024 1451 Last data filed at 05/06/2024 9147 Gross per 24 hour  Intake 480 ml  Output 1400 ml  Net -920 ml   Filed Weights   05/04/24 0500 05/05/24 0500 05/06/24 0300  Weight: 63.8 kg 70.1 kg 73.2 kg    Data Reviewed: I have personally reviewed and interpreted daily labs, tele strips, imagings as discussed above. I reviewed all nursing notes, pharmacy notes, vitals, pertinent old records I have discussed plan of care as described above with RN and patient/family.  CBC: Recent Labs  Lab 05/02/24 2157 05/03/24 0628 05/04/24 0308 05/05/24 0017 05/06/24 0452  WBC 1.5* 10.7* 13.3* 17.3* 21.2*  NEUTROABS 0.9*  --   --   --   --   HGB 10.3* 8.6* 9.3* 9.6* 10.1*  HCT 33.8* 27.2* 28.7* 29.4* 30.5*  MCV 101.5* 98.6 96.6 95.1 92.1  PLT 112* 96* 64* 57* 39*   Basic Metabolic Panel: Recent Labs  Lab 05/03/24 0628 05/04/24 0308 05/04/24 0712 05/05/24 0017 05/06/24 0452  NA 137 139 139 138 138  K 3.7 5.6* 4.4 4.5 4.3  CL 107 108  107 108 107  CO2 16* 20* 19* 21* 18*  GLUCOSE 157* 147* 124* 188* 120*  BUN 44* 63* 64* 84* 105*  CREATININE 2.73* 3.76* 3.77* 3.90* 3.38*  CALCIUM 8.1* 8.5* 8.2* 8.2* 9.1  MG 1.0* 2.1  --  2.3 2.4  PHOS 2.4* 5.8*  --  5.8* 5.5*    Studies: No results found.   Scheduled Meds:  sodium bicarbonate       brimonidine  1 drop Right Eye TID   brinzolamide  1 drop Right Eye TID   Chlorhexidine Gluconate Cloth  6 each Topical Daily   heparin injection (subcutaneous)  5,000 Units Subcutaneous Q8H   hydrocortisone sod succinate (SOLU-CORTEF) inj  100 mg Intravenous Q8H   insulin  aspart  0-9 Units Subcutaneous Q4H   insulin  glargine-yfgn  15 Units Subcutaneous Daily   metoprolol tartrate  25 mg Oral BID   mupirocin ointment  1 Application Nasal BID   mycophenolate  360 mg Oral BID   pantoprazole  40 mg Oral BID   [START ON 05/07/2024] sodium bicarbonate  650 mg Oral  TID   tacrolimus   0.5 mg Oral Q1200   tacrolimus   1 mg Oral Q1200   tamsulosin  0.4 mg Oral QPC supper   Continuous Infusions:  cefTRIAXone (ROCEPHIN)  IV 2 g (05/06/24 1242)   PRN Meds: sodium bicarbonate, acetaminophen, docusate sodium, fentaNYL (SUBLIMAZE) injection, ondansetron (ZOFRAN) IV, mouth rinse, polyethylene glycol, traZODone  Time spent: 55 minutes  Author: Althia Atlas. MD [facility] 05/06/2024 2:51 PM  To reach On-call, see care teams to locate the attending and reach out to them via [email or web address]. If 7PM-7AM, please contact night-coverage If you still have difficulty reaching the attending provider, please page the [facility] (Director on Call) for [facility] on amion for assistance.

## 2024-05-07 DIAGNOSIS — R6520 Severe sepsis without septic shock: Secondary | ICD-10-CM | POA: Diagnosis not present

## 2024-05-07 DIAGNOSIS — A419 Sepsis, unspecified organism: Secondary | ICD-10-CM | POA: Diagnosis not present

## 2024-05-07 LAB — BASIC METABOLIC PANEL WITH GFR
Anion gap: 9 (ref 5–15)
BUN: 112 mg/dL — ABNORMAL HIGH (ref 8–23)
CO2: 26 mmol/L (ref 22–32)
Calcium: 9.1 mg/dL (ref 8.9–10.3)
Chloride: 105 mmol/L (ref 98–111)
Creatinine, Ser: 3.09 mg/dL — ABNORMAL HIGH (ref 0.61–1.24)
GFR, Estimated: 22 mL/min — ABNORMAL LOW (ref >60)
Glucose, Bld: 76 mg/dL (ref 70–99)
Potassium: 3.7 mmol/L (ref 3.5–5.1)
Sodium: 140 mmol/L (ref 135–145)

## 2024-05-07 LAB — CBC
HCT: 28.8 % — ABNORMAL LOW (ref 39.0–52.0)
HCT: 30.1 % — ABNORMAL LOW (ref 39.0–52.0)
Hemoglobin: 9.6 g/dL — ABNORMAL LOW (ref 13.0–17.0)
Hemoglobin: 10 g/dL — ABNORMAL LOW (ref 13.0–17.0)
MCH: 30.5 pg (ref 26.0–34.0)
MCH: 30.6 pg (ref 26.0–34.0)
MCHC: 33.3 g/dL (ref 30.0–36.0)
MCHC: 33.2 g/dL (ref 30.0–36.0)
MCV: 91.4 fL (ref 80.0–100.0)
MCV: 92 fL (ref 80.0–100.0)
Platelets: 30 10*3/uL — ABNORMAL LOW (ref 150–400)
Platelets: 30 10*3/uL — ABNORMAL LOW (ref 150–400)
RBC: 3.15 MIL/uL — ABNORMAL LOW (ref 4.22–5.81)
RBC: 3.27 MIL/uL — ABNORMAL LOW (ref 4.22–5.81)
RDW: 14 % (ref 11.5–15.5)
RDW: 14 % (ref 11.5–15.5)
WBC: 16.2 10*3/uL — ABNORMAL HIGH (ref 4.0–10.5)
WBC: 17.9 10*3/uL — ABNORMAL HIGH (ref 4.0–10.5)
nRBC: 0 % (ref 0.0–0.2)
nRBC: 0.2 % (ref 0.0–0.2)

## 2024-05-07 LAB — APTT
aPTT: 48 s — ABNORMAL HIGH (ref 24–36)
aPTT: 57 s — ABNORMAL HIGH (ref 24–36)

## 2024-05-07 LAB — GLUCOSE, CAPILLARY
Glucose-Capillary: 107 mg/dL — ABNORMAL HIGH (ref 70–99)
Glucose-Capillary: 87 mg/dL (ref 70–99)
Glucose-Capillary: 50 mg/dL — ABNORMAL LOW (ref 70–99)
Glucose-Capillary: 54 mg/dL — ABNORMAL LOW (ref 70–99)
Glucose-Capillary: 111 mg/dL — ABNORMAL HIGH (ref 70–99)
Glucose-Capillary: 269 mg/dL — ABNORMAL HIGH (ref 70–99)
Glucose-Capillary: 301 mg/dL — ABNORMAL HIGH (ref 70–99)
Glucose-Capillary: 323 mg/dL — ABNORMAL HIGH (ref 70–99)
Glucose-Capillary: 177 mg/dL — ABNORMAL HIGH (ref 70–99)

## 2024-05-07 LAB — PHOSPHORUS: Phosphorus: 4.9 mg/dL — ABNORMAL HIGH (ref 2.5–4.6)

## 2024-05-07 LAB — MAGNESIUM: Magnesium: 2.6 mg/dL — ABNORMAL HIGH (ref 1.7–2.4)

## 2024-05-07 MED ORDER — GLUCOSE 40 % PO GEL
2.0000 | ORAL | Status: AC
  Administered 2024-05-07: 62 g via ORAL
  Filled 2024-05-07: qty 2.42

## 2024-05-07 MED ORDER — ARGATROBAN 50 MG/50ML IV SOLN
1.2000 ug/kg/min | INTRAVENOUS | Status: DC
  Administered 2024-05-07: 1.2 ug/kg/min via INTRAVENOUS
  Administered 2024-05-07: 1 ug/kg/min via INTRAVENOUS
  Administered 2024-05-08 – 2024-05-09 (×3): 1.2 ug/kg/min via INTRAVENOUS
  Filled 2024-05-07 (×7): qty 50

## 2024-05-07 MED ORDER — CHLORHEXIDINE GLUCONATE CLOTH 2 % EX PADS
6.0000 | MEDICATED_PAD | Freq: Every day | CUTANEOUS | Status: DC
  Administered 2024-05-07 – 2024-05-11 (×5): 6 via TOPICAL

## 2024-05-07 MED ORDER — INSULIN GLARGINE-YFGN 100 UNIT/ML ~~LOC~~ SOLN
8.0000 [IU] | Freq: Every day | SUBCUTANEOUS | Status: DC
  Administered 2024-05-07 – 2024-05-09 (×3): 8 [IU] via SUBCUTANEOUS
  Filled 2024-05-07 (×4): qty 0.08

## 2024-05-07 NOTE — Inpatient Diabetes Management (Signed)
 Inpatient Diabetes Program Recommendations  AACE/ADA: New Consensus Statement on Inpatient Glycemic Control (2015)  Target Ranges:  Prepandial:   less than 140 mg/dL      Peak postprandial:   less than 180 mg/dL (1-2 hours)      Critically ill patients:  140 - 180 mg/dL   Lab Results  Component Value Date   GLUCAP 111 (H) 05/07/2024   HGBA1C 5.5 05/04/2024    Review of Glycemic Control  Latest Reference Range & Units 05/07/24 08:49 05/07/24 09:16 05/07/24 09:59  Glucose-Capillary 70 - 99 mg/dL 50 (L) 54 (L) 161 (H)  (L): Data is abnormally low (H): Data is abnormally high Diabetes history: Type 1 DM Outpatient Diabetes medications: insulin  pump Current orders for Inpatient glycemic control: Novolog  0-9 units Q4H, Semglee 15 units QD  Inpatient Diabetes Program Recommendations:    Noted hypoglycemia. Consider reducing Semglee to 8 units every day.   Thanks, Marjo Sievert, MSN, RNC-OB Diabetes Coordinator [phone] (8a-5p)

## 2024-05-07 NOTE — Progress Notes (Signed)
 Pharmacy Heparin Induced Thrombocytopenia (HIT) Note:  Rick Wang is an 68 y.o. male being evaluated for HIT. SubQ heparin was started 05/03/24 for DVT prophylaxis, and baseline platelets were 112.   HIT labs were ordered on 05/07/24 when platelets dropped to 30.  Auto-populate labs: No results found for: "HEPINDPLTAB", "SRALOWDOSEHP", "SRAHIGHDOSEH"   CALCULATE SCORE:  4Ts (see the HIT Algorithm) Score  Thrombocytopenia 2  Timing 2  Thrombosis 0  Other causes of thrombocytopenia 2  Total 6     Recommendations (A or B) are based on available lab results (HIT antibody and/or SRA) and the HIT algorithm    A. HIT antibody result available  Possible HIT   Order SRA:  Yes Discontinue heparin / LMWH:  Yes Initiate alternative anticoagulation:  Yes Document heparin allergy:  Yes   B. SRA result availability  SRA not available   Name of MD Contacted: Rick North, MD  Plan (Discussed with provider) Labs ordered:  SRA ordered  Heparin allergy:  Heparin allergy documented or updated. Anticoagulation plans:  Discontinue heparin / LMWH   Rick Wang 05/07/2024, 9:34 AM

## 2024-05-07 NOTE — Progress Notes (Signed)
 [facility] Progress Note  Patient: Rick Wang    UUV:[id number]  DOA: 05/02/2024     Date of Service: the patient was seen and examined on 05/07/2024  Chief Complaint  Patient presents with   Fever   Near Syncope   Brief hospital course: Rick Wang is 68 year old with h/o HTN, HLD, Diabetes type 1, Renal transplant 2012, CKD, DVT, Eosinophilic esophagitis with dysphagia that presented 5/20 to the ER with complaints of Fever and syncope. To review, patient received Hep B vaccine Monday, on Tuesday developed Malaise, poor appetite, fever, nausea, confusion. Wife states that he was standing and his legs gave out, she assisted him to the ground. EMS was called, upon arrival to the ER BP 113/92, HR 114, Temp 102.6. Labs revealed chronic Leukopenia 1.5, hgb 10.3, Plts 112, BUN 43, creat 2.07, Lactic acid 5.2, PT 17, INR 1.4, Beta hydroxybutyrate negative, RVP negative. UA+, culture pending, BC x 2, and he was started on Cefepime and Vancomycin. He was given 30 ml/kg IVF bolus. With ongoing hypotension he was given and additional 1 liter (3 Liters total) and started on Levophed infusion. He is now admitted to the ICU for further management of his septic shock.   5/21: Admitted to ICU with septic shock due to UTI. BC x 2, RVP negative, Urine culture pending, Cefepime + Vancomycin. S/p 3 Liters IVF. Starting insulin  drip and discontinuing patients pump.  05/04/24-patient had insomnia overnight, on 2L/min [place].  S/p ID eval.  Remains oliguric and has been seen by nephrology. Has dysphagia and is for SLP.  Remains off vasopressors.  Transferred to [facility] on 5/23  Assessment and Plan:   # Septic Shock secondary to UTI # Immunocompromised, chronic # Lactic Acidosis # Hypotension - s/p  Cefepime + Vancomycin - s/p 3 liters IVFB, and  IVF @ 150 ml/hr, s/p Levophed infusion - due to Persistent hypotension stress dose steroids started in the ICU, started tapering dose 5/21 Urine culture growing E.  coli pansensitive 5/20 blood culture growing E. Coli, pansensitive 5/21 started ceftriaxone 2 g IV daily 5/23 repeat blood culture <12 hr NGTD CT a/p: No acute inflammatory process identified within the abdomen or pelvis. 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 No lung mass, consolidation or pneumothorax. No suspicious lung nodule. 5/25 supplemental O2 admission has been weaned off, saturating well on room air Repeat chest x-ray tomorrow a.m. to eval pleural effusion and need for thoracentesis  # Acute on Chronic Kidney Disease  # Renal Transplant 2012 @ [facility] - Reach out to Transplant team in AM - Daily BMP - Avoid nephrotoxic agents - held home Mycophenolate and Tacrolimus  on admission - Fluid resuscitation  - Renal US  5/23 as nephro: Will restart tacrolimus  dismiss at half of home dose, 1 mg in a.m. and 0.5 mg in evening.   5/24 restarted Myfortic. Spoke with Dr Helga Loan, who agrees with treatment plan.  Cr 3.09 improving  # Metabolic acidosis secondary to AKI/CKD 5/24 CO2 18, bicarb 100 mEq IV one-time dose given followed by oral supplement. Monitor chemistry daily   # Urinary retention,  Foley catheter was inserted on 5/23, around 400 mL urine was collected immediately after Foley catheter insertion Started Flomax Keep Foley for few days and then DC before discharge    # Diabetes Type 1, utilizes insulin  pump Removed Insulin  pump and Endotool was started in the ICU - Glucose checks per Endotool - Consider endocrine  consult   # Hypertension # Hyperlipidemia Antihypertensive medications were held due to sepsis and hypotension Continue to hold statin for now 5/23 BP improved, patient is hypertensive Patient was on amlodipine 5 mg and lisinopril 40 mg at home Started Lopressor 25 mg p.o. twice daily with holding parameters    # Thrombocytopenia # Anemia Hb 9.6--10.1 Platelets 57>39k - Monitor for bleeding - Daily CBC   # Eosinophilic Esophagitis w/ Dysphagia Patient is s/p recent EGD with biopsy's - Continue home PPI -SLP evaluation done, no oropharyngeal dysphagia.   Body mass index is 23.98 kg/m.  Interventions:  Diet: Dysphagia 3 diet with thin liquids DVT Prophylaxis: Subcutaneous Heparin    Advance goals of care discussion: Full code  Family Communication: family was present at bedside, at the time of interview.  The pt provided permission to discuss medical plan with the family. Opportunity was given to ask question and all questions were answered satisfactorily.   Disposition:  Pt is from home, admitted with sepsis due to UTI, still on IV Abx, which precludes a safe discharge. Discharge to Home, when stable and cleared by ID and nephrology.  Subjective: No significant events overnight, patient's breathing is gradually improving, saturating well on room air.  Denied any chest pain or palpitations, no any bleeding.  Denied any complaints   Physical Exam: General: NAD, lying comfortably Appear in no distress, affect appropriate Eyes: PERRLA ENT: Oral Mucosa Clear, moist  Neck: no JVD,  Cardiovascular: S1 and S2 Present, no Murmur,  Respiratory: Equal air entry bilaterally, bibasilar crackles, no wheezes  Abdomen: Bowel Sound present, Soft and no tenderness,  Skin: no rashes Extremities: no Pedal edema, no calf tenderness Neurologic: without any new focal findings Gait not checked due to patient safety concerns  Vitals:   05/06/24 1955 05/07/24 0305 05/07/24 0500  05/07/24 0855  BP: 112/68 118/70  119/76  Pulse: 71 65  82  Resp: 16 16  18   Temp: 97.6 F (36.4 C) 97.6 F (36.4 C)  97.8 F (36.6 C)  TempSrc: Oral Oral  Oral  SpO2: 97% 95%  93%  Weight:   75.8 kg   Height:        Intake/Output Summary (Last 24 hours) at 05/07/2024 1313 Last data filed at 05/07/2024 1159 Gross per 24 hour  Intake 580 ml  Output 850 ml  Net -270 ml   Filed Weights   05/05/24 0500 05/06/24 0300 05/07/24 0500  Weight: 70.1 kg 73.2 kg 75.8 kg    Data Reviewed: I have personally reviewed and interpreted daily labs, tele strips, imagings as discussed above. I reviewed all nursing notes, pharmacy notes, vitals, pertinent old records I have discussed plan of care as described above with RN and patient/family.  CBC: Recent Labs  Lab 05/02/24 2157 05/03/24 0960 05/04/24 0308 05/05/24 0017 05/06/24 0452 05/07/24 0525  WBC 1.5* 10.7* 13.3* 17.3* 21.2* 16.2*  NEUTROABS 0.9*  --   --   --   --   --   HGB 10.3* 8.6* 9.3* 9.6* 10.1* 9.6*  HCT 33.8* 27.2* 28.7* 29.4* 30.5* 28.8*  MCV 101.5* 98.6 96.6 95.1 92.1 91.4  PLT 112* 96* 64*  57* 39* 30*   Basic Metabolic Panel: Recent Labs  Lab 05/03/24 0628 05/04/24 0308 05/04/24 0712 05/05/24 0017 05/06/24 0452 05/07/24 0525  NA 137 139 139 138 138 140  K 3.7 5.6* 4.4 4.5 4.3 3.7  CL 107 108 107 108 107 105  CO2 16* 20* 19* 21* 18* 26  GLUCOSE 157* 147* 124* 188* 120* 76  BUN 44* 63* 64* 84* 105* 112*  CREATININE 2.73* 3.76* 3.77* 3.90* 3.38* 3.09*  CALCIUM 8.1* 8.5* 8.2* 8.2* 9.1 9.1  MG 1.0* 2.1  --  2.3 2.4 2.6*  PHOS 2.4* 5.8*  --  5.8* 5.5* 4.9*    Studies: No results found.   Scheduled Meds:  brimonidine  1 drop Right Eye TID   brinzolamide  1 drop Right Eye TID   Chlorhexidine Gluconate Cloth  6 each Topical Daily   hydrocortisone sod succinate (SOLU-CORTEF) inj  100 mg Intravenous Q12H   Followed by   Cecily Cohen ON 05/08/2024] hydrocortisone sod succinate (SOLU-CORTEF) inj  50 mg Intravenous  Q12H   insulin  aspart  0-9 Units Subcutaneous Q4H   insulin  glargine-yfgn  8 Units Subcutaneous Daily   metoprolol tartrate  25 mg Oral BID   mupirocin ointment  1 Application Nasal BID   mycophenolate  360 mg Oral BID   pantoprazole  40 mg Oral BID   tacrolimus   0.5 mg Oral Q1200   tacrolimus   1 mg Oral Q1200   tamsulosin  0.4 mg Oral QPC supper   Continuous Infusions:  argatroban 1 mcg/kg/min (05/07/24 1151)   cefTRIAXone (ROCEPHIN)  IV 2 g (05/07/24 1219)   PRN Meds: acetaminophen, docusate sodium, fentaNYL (SUBLIMAZE) injection, ondansetron (ZOFRAN) IV, mouth rinse, polyethylene glycol, traZODone  Time spent: 55 minutes  Author: Althia Atlas. MD [facility] 05/07/2024 1:13 PM  To reach On-call, see care teams to locate the attending and reach out to them via [email or web address]. If 7PM-7AM, please contact night-coverage If you still have difficulty reaching the attending provider, please page the [facility] (Director on Call) for [facility] on amion for assistance.

## 2024-05-07 NOTE — Progress Notes (Signed)
 Mobility Specialist - Progress Note   05/07/24 1356  Mobility  Activity Ambulated with assistance in hallway  Level of Assistance Standby assist, set-up cues, supervision of patient - no hands on  Assistive Device Front wheel walker  Distance Ambulated (ft) 180 ft  Activity Response Tolerated well  Mobility Referral Yes  Mobility visit 1 Mobility  Mobility Specialist Start Time (ACUTE ONLY) 1324  Mobility Specialist Stop Time (ACUTE ONLY) 1341  Mobility Specialist Time Calculation (min) (ACUTE ONLY) 17 min   Pt sitting in recliner on RA upon arrival. Pt STS and ambulates in hallway SBA with no LOB noted. Pt returns to recliner with family present.   Wash Hack  Mobility Specialist  05/07/24 1:57 PM

## 2024-05-07 NOTE — Plan of Care (Signed)

## 2024-05-07 NOTE — Progress Notes (Signed)
 [facility]  ROUNDING NOTE   Subjective:  Patient sitting up in chair, no acute events overnight. Patient reports having worked with PT successfully. Appetite improving.   Objective:  Vital signs in last 24 hours:  Temp:  [97.6 F (36.4 C)-98.2 F (36.8 C)] 97.8 F (36.6 C) (05/25 0855) Pulse Rate:  [65-82] 82 (05/25 0855) Resp:  [16-18] 18 (05/25 0855) BP: (108-119)/(68-76) 119/76 (05/25 0855) SpO2:  [93 %-98 %] 93 % (05/25 0855) Weight:  [75.8 kg] 75.8 kg (05/25 0500)  Weight change: 2.6 kg Filed Weights   05/05/24 0500 05/06/24 0300 05/07/24 0500  Weight: 70.1 kg 73.2 kg 75.8 kg    Intake/Output: I/O last 3 completed shifts: In: 580 [P.O.:480; IV Piggyback:100] Out: 1000 [Urine:1000]   Intake/Output this shift:  Total I/O In: 240 [P.O.:240] Out: 850 [Urine:850]  Physical Exam: General: NAD,   Head: Normocephalic, atraumatic. Moist oral mucosal membranes  Eyes: Anicteric, PERRL  Neck: Supple, trachea midline  Lungs:  Clear to auscultation  Heart: Regular rate and rhythm  Abdomen:  Soft, nontender,   Extremities:  No peripheral edema.  Neurologic: Nonfocal, moving all four extremities  Skin: No lesions  Access: None    Basic Metabolic Panel: Recent Labs  Lab 05/03/24 0628 05/04/24 0308 05/04/24 0712 05/05/24 0017 05/06/24 0452 05/07/24 0525  NA 137 139 139 138 138 140  K 3.7 5.6* 4.4 4.5 4.3 3.7  CL 107 108 107 108 107 105  CO2 16* 20* 19* 21* 18* 26  GLUCOSE 157* 147* 124* 188* 120* 76  BUN 44* 63* 64* 84* 105* 112*  CREATININE 2.73* 3.76* 3.77* 3.90* 3.38* 3.09*  CALCIUM 8.1* 8.5* 8.2* 8.2* 9.1 9.1  MG 1.0* 2.1  --  2.3 2.4 2.6*  PHOS 2.4* 5.8*  --  5.8* 5.5* 4.9*    Liver Function Tests: Recent Labs  Lab 05/02/24 2157  AST 42*  ALT 25  ALKPHOS 64  BILITOT 1.3*  PROT 6.4*  ALBUMIN 3.3*   No results for input(s): "LIPASE", "AMYLASE" in the last 168 hours. No results for input(s): "AMMONIA" in the last 168  hours.  CBC: Recent Labs  Lab 05/02/24 2157 05/03/24 0628 05/04/24 0308 05/05/24 0017 05/06/24 0452 05/07/24 0525  WBC 1.5* 10.7* 13.3* 17.3* 21.2* 16.2*  NEUTROABS 0.9*  --   --   --   --   --   HGB 10.3* 8.6* 9.3* 9.6* 10.1* 9.6*  HCT 33.8* 27.2* 28.7* 29.4* 30.5* 28.8*  MCV 101.5* 98.6 96.6 95.1 92.1 91.4  PLT 112* 96* 64* 57* 39* 30*    Cardiac Enzymes: No results for input(s): "CKTOTAL", "CKMB", "CKMBINDEX", "TROPONINI" in the last 168 hours.  BNP: Invalid input(s): "POCBNP"  CBG: Recent Labs  Lab 05/07/24 0427 05/07/24 0849 05/07/24 0916 05/07/24 0959 05/07/24 1155  GLUCAP 87 50* 54* 111* 269*    Microbiology: Results for orders placed or performed during the hospital encounter of 05/02/24  Culture, blood (Routine x 2)     Status: Abnormal   Collection Time: 05/02/24  9:57 PM   Specimen: BLOOD  Result Value Ref Range Status   Specimen Description   Final    BLOOD BLOOD RIGHT ARM Performed at [facility], [address] [place], [place] [address]    Special Requests   Final    BOTTLES DRAWN AEROBIC AND ANAEROBIC Blood Culture adequate volume Performed at [facility], [address], [place], [place] [address]    Culture  Setup Time   Final  GRAM NEGATIVE RODS IN BOTH AEROBIC AND ANAEROBIC BOTTLES CRITICAL VALUE NOTED.  VALUE IS CONSISTENT WITH PREVIOUSLY REPORTED AND CALLED VALUE. Performed at [facility], [address] [place], [place] [address]    Culture (A)  Final    ESCHERICHIA COLI SUSCEPTIBILITIES PERFORMED ON PREVIOUS CULTURE WITHIN THE LAST 5 DAYS. Performed at [facility], [address], [place], [place] [address]    Report Status 05/05/2024 FINAL  Final  Culture, blood (Routine x 2)     Status: Abnormal   Collection Time: 05/02/24  9:58 PM   Specimen: BLOOD  Result Value Ref Range Status   Specimen Description   Final    BLOOD BLOOD RIGHT ARM Performed at [facility], [address] [place], [place] [address]    Special Requests   Final    BOTTLES DRAWN AEROBIC AND ANAEROBIC Blood Culture adequate volume Performed at [facility], [address] [place], [place] [address]    Culture  Setup Time   Final    GRAM NEGATIVE RODS IN BOTH AEROBIC AND ANAEROBIC BOTTLES CRITICAL RESULT CALLED TO, READ BACK BY AND VERIFIED WITH: SHEEMA HALLAJI PHARMD 1020 05/03/24 HNM    Culture ESCHERICHIA COLI (A)  Final   Report Status 05/05/2024 FINAL  Final   Organism ID, Bacteria ESCHERICHIA COLI  Final   Organism ID, Bacteria ESCHERICHIA COLI  Final      Susceptibility   Escherichia coli - KIRBY BAUER*    CEFAZOLIN SENSITIVE Sensitive    Escherichia coli - MIC*    AMPICILLIN 4 SENSITIVE Sensitive     CEFEPIME <=0.12 SENSITIVE Sensitive     CEFTAZIDIME <=1 SENSITIVE Sensitive     CEFTRIAXONE <=0.25 SENSITIVE Sensitive     CIPROFLOXACIN <=0.25 SENSITIVE Sensitive     GENTAMICIN <=1 SENSITIVE Sensitive     IMIPENEM <=0.25 SENSITIVE Sensitive     TRIMETH/SULFA <=20 SENSITIVE Sensitive     AMPICILLIN/SULBACTAM <=2 SENSITIVE Sensitive     PIP/TAZO <=4 SENSITIVE Sensitive ug/mL    * ESCHERICHIA COLI    ESCHERICHIA COLI  Blood Culture ID Panel (Reflexed)     Status: Abnormal   Collection Time: 05/02/24  9:58 PM  Result Value Ref Range Status   Enterococcus faecalis NOT DETECTED NOT DETECTED Final   Enterococcus Faecium NOT DETECTED NOT DETECTED Final   Listeria monocytogenes NOT DETECTED NOT DETECTED Final   Staphylococcus species NOT DETECTED NOT DETECTED Final   Staphylococcus aureus (BCID) NOT DETECTED NOT DETECTED Final   Staphylococcus epidermidis NOT DETECTED NOT DETECTED Final   Staphylococcus lugdunensis NOT DETECTED NOT DETECTED Final   Streptococcus species NOT DETECTED NOT DETECTED Final   Streptococcus agalactiae NOT DETECTED NOT DETECTED Final   Streptococcus pneumoniae NOT DETECTED NOT DETECTED Final   Streptococcus pyogenes NOT DETECTED NOT  DETECTED Final   A.calcoaceticus-baumannii NOT DETECTED NOT DETECTED Final   Bacteroides fragilis NOT DETECTED NOT DETECTED Final   Enterobacterales DETECTED (A) NOT DETECTED Final    Comment: Enterobacterales represent a large order of gram negative bacteria, not a single organism. CRITICAL RESULT CALLED TO, READ BACK BY AND VERIFIED WITH: SHEEMA HALLAJI PHARMD 1020 05/03/24 HNM    Enterobacter cloacae complex NOT DETECTED NOT DETECTED Final   Escherichia coli DETECTED (A) NOT DETECTED Final    Comment: CRITICAL RESULT CALLED TO, READ BACK BY AND VERIFIED WITH: SHEEMA HALLAJI PHARMD 1020 05/03/24 HNM    Klebsiella aerogenes NOT DETECTED NOT DETECTED Final   Klebsiella oxytoca NOT DETECTED NOT DETECTED  Final   Klebsiella pneumoniae NOT DETECTED NOT DETECTED Final   Proteus species NOT DETECTED NOT DETECTED Final   Salmonella species NOT DETECTED NOT DETECTED Final   Serratia marcescens NOT DETECTED NOT DETECTED Final   Haemophilus influenzae NOT DETECTED NOT DETECTED Final   Neisseria meningitidis NOT DETECTED NOT DETECTED Final   Pseudomonas aeruginosa NOT DETECTED NOT DETECTED Final   Stenotrophomonas maltophilia NOT DETECTED NOT DETECTED Final   Candida albicans NOT DETECTED NOT DETECTED Final   Candida auris NOT DETECTED NOT DETECTED Final   Candida glabrata NOT DETECTED NOT DETECTED Final   Candida krusei NOT DETECTED NOT DETECTED Final   Candida parapsilosis NOT DETECTED NOT DETECTED Final   Candida tropicalis NOT DETECTED NOT DETECTED Final   Cryptococcus neoformans/gattii NOT DETECTED NOT DETECTED Final   CTX-M ESBL NOT DETECTED NOT DETECTED Final   Carbapenem resistance IMP NOT DETECTED NOT DETECTED Final   Carbapenem resistance KPC NOT DETECTED NOT DETECTED Final   Carbapenem resistance NDM NOT DETECTED NOT DETECTED Final   Carbapenem resist OXA 48 LIKE NOT DETECTED NOT DETECTED Final   Carbapenem resistance VIM NOT DETECTED NOT DETECTED Final    Comment: Performed at  [facility], [address] [place], [place] [address]  Resp panel by RT-PCR (RSV, Flu A&B, Covid) Anterior Nasal Swab     Status: None   Collection Time: 05/02/24 11:17 PM   Specimen: Anterior Nasal Swab  Result Value Ref Range Status   SARS Coronavirus 2 by RT PCR NEGATIVE NEGATIVE Final    Comment: (NOTE) SARS-CoV-2 target nucleic acids are NOT DETECTED.  The SARS-CoV-2 RNA is generally detectable in upper respiratory specimens during the acute phase of infection. The lowest concentration of SARS-CoV-2 viral copies this assay can detect is 138 copies/mL. A negative result does not preclude SARS-Cov-2 infection and should not be used as the sole basis for treatment or other patient management decisions. A negative result may occur with  improper specimen collection/handling, submission of specimen other than nasopharyngeal swab, presence of viral mutation(s) within the areas targeted by this assay, and inadequate number of viral copies(<138 copies/mL). A negative result must be combined with clinical observations, patient history, and epidemiological information. The expected result is Negative.  Fact Sheet for Patients:  BloggerCourse.com  Fact Sheet for Healthcare Providers:  SeriousBroker.it  This test is no t yet approved or cleared by the [place]  FDA and  has been authorized for detection and/or diagnosis of SARS-CoV-2 by FDA under an Emergency Use Authorization (EUA). This EUA will remain  in effect (meaning this test can be used) for the duration of the COVID-19 declaration under Section 564(b)(1) of the Act, 21 U.S.C.section 360bbb-3(b)(1), unless the authorization is terminated  or revoked sooner.       Influenza A by PCR NEGATIVE NEGATIVE Final   Influenza B by PCR NEGATIVE NEGATIVE Final    Comment: (NOTE) The Xpert Xpress SARS-CoV-2/FLU/RSV plus assay is intended as an aid in the diagnosis of  influenza from Nasopharyngeal swab specimens and should not be used as a sole basis for treatment. Nasal washings and aspirates are unacceptable for Xpert Xpress SARS-CoV-2/FLU/RSV testing.  Fact Sheet for Patients: BloggerCourse.com  Fact Sheet for Healthcare Providers: SeriousBroker.it  This test is not yet approved or cleared by the [place]  FDA and has been authorized for detection and/or diagnosis of SARS-CoV-2 by FDA under an Emergency Use Authorization (EUA). This EUA will remain in effect (meaning this test can be used) for the duration  of the COVID-19 declaration under Section 564(b)(1) of the Act, 21 U.S.C. section 360bbb-3(b)(1), unless the authorization is terminated or revoked.     Resp Syncytial Virus by PCR NEGATIVE NEGATIVE Final    Comment: (NOTE) Fact Sheet for Patients: BloggerCourse.com  Fact Sheet for Healthcare Providers: SeriousBroker.it  This test is not yet approved or cleared by the [place]  FDA and has been authorized for detection and/or diagnosis of SARS-CoV-2 by FDA under an Emergency Use Authorization (EUA). This EUA will remain in effect (meaning this test can be used) for the duration of the COVID-19 declaration under Section 564(b)(1) of the Act, 21 U.S.C. section 360bbb-3(b)(1), unless the authorization is terminated or revoked.  Performed at [facility], [address] [place], [place] [address]   Urine Culture     Status: Abnormal   Collection Time: 05/03/24 12:18 AM   Specimen: Urine, Random  Result Value Ref Range Status   Specimen Description   Final    URINE, RANDOM Performed at [facility], [address] [place], [place] [address]    Special Requests   Final    NONE Reflexed from [phone] Performed at [facility], [address] [place], [place] [address]    Culture >=100,000  COLONIES/mL ESCHERICHIA COLI (A)  Final   Report Status 05/05/2024 FINAL  Final   Organism ID, Bacteria ESCHERICHIA COLI (A)  Final      Susceptibility   Escherichia coli - MIC*    AMPICILLIN 4 SENSITIVE Sensitive     CEFAZOLIN <=4 SENSITIVE Sensitive     CEFEPIME <=0.12 SENSITIVE Sensitive     CEFTRIAXONE <=0.25 SENSITIVE Sensitive     CIPROFLOXACIN <=0.25 SENSITIVE Sensitive     GENTAMICIN <=1 SENSITIVE Sensitive     IMIPENEM <=0.25 SENSITIVE Sensitive     NITROFURANTOIN <=16 SENSITIVE Sensitive     TRIMETH/SULFA <=20 SENSITIVE Sensitive     AMPICILLIN/SULBACTAM <=2 SENSITIVE Sensitive     PIP/TAZO <=4 SENSITIVE Sensitive ug/mL    * >=100,000 COLONIES/mL ESCHERICHIA COLI  MRSA Next Gen by PCR, Nasal     Status: Abnormal   Collection Time: 05/03/24  4:00 AM   Specimen: Nasal Mucosa; Nasal Swab  Result Value Ref Range Status   MRSA by PCR Next Gen DETECTED (A) NOT DETECTED Final    Comment: RESULT CALLED TO, READ BACK BY AND VERIFIED WITH: Salina Craver RN [phone] 05/03/24 HNM (NOTE) The GeneXpert MRSA Assay (FDA approved for NASAL specimens only), is one component of a comprehensive MRSA colonization surveillance program. It is not intended to diagnose MRSA infection nor to guide or monitor treatment for MRSA infections. Test performance is not FDA approved in patients less than 86 years old. Performed at [facility], [address] [place], [place] [address]   Culture, blood (Routine X 2) w Reflex to ID Panel     Status: None (Preliminary result)   Collection Time: 05/05/24 12:17 AM   Specimen: BLOOD RIGHT ARM  Result Value Ref Range Status   Specimen Description BLOOD RIGHT ARM  Final   Special Requests   Final    BOTTLES DRAWN AEROBIC AND ANAEROBIC Blood Culture adequate volume   Culture   Final    NO GROWTH 2 DAYS Performed at [facility], [address] [place], [place] [address]    Report Status PENDING  Incomplete  Culture, blood (Routine X 2)  w Reflex to ID Panel     Status: None (Preliminary result)   Collection Time:  05/05/24 12:17 AM   Specimen: BLOOD LEFT HAND  Result Value Ref Range Status   Specimen Description BLOOD LEFT HAND  Final   Special Requests   Final    BOTTLES DRAWN AEROBIC AND ANAEROBIC Blood Culture adequate volume   Culture   Final    NO GROWTH 2 DAYS Performed at [facility], [address] [place], [place] [address]    Report Status PENDING  Incomplete    Coagulation Studies: No results for input(s): "LABPROT", "INR" in the last 72 hours.  Urinalysis: No results for input(s): "COLORURINE", "LABSPEC", "PHURINE", "GLUCOSEU", "HGBUR", "BILIRUBINUR", "KETONESUR", "PROTEINUR", "UROBILINOGEN", "NITRITE", "LEUKOCYTESUR" in the last 72 hours.  Invalid input(s): "APPERANCEUR"    Imaging: CT ABDOMEN PELVIS WO CONTRAST Result Date: 05/05/2024 CLINICAL DATA:  Abdominal pain, acute, nonlocalized; Pleural effusion, malignancy suspected. EXAM: CT CHEST, ABDOMEN AND PELVIS WITHOUT CONTRAST TECHNIQUE: Multidetector CT imaging of the chest, abdomen and pelvis was performed following the standard protocol without IV contrast. RADIATION DOSE REDUCTION: This exam was performed according to the departmental dose-optimization program which includes automated exposure control, adjustment of the mA and/or kV according to patient size and/or use of iterative reconstruction technique. COMPARISON:  None Available. FINDINGS: CT CHEST FINDINGS Cardiovascular: Normal cardiac size. no pericardial effusion. No aortic aneurysm. There are coronary artery calcifications, in keeping with coronary artery disease. Mediastinum/Nodes: Visualized thyroid  gland appears grossly unremarkable. No solid / cystic mediastinal masses. The esophagus is nondistended precluding optimal assessment. No mediastinal or axillary lymphadenopathy by size criteria. Evaluation of bilateral hila is limited due to lack on intravenous contrast: however, no  large hilar lymphadenopathy identified. Lungs/Pleura: The central tracheo-bronchial tree is patent. There is small left and small-to-moderate right pleural effusion with associated compressive atelectatic changes in the bilateral lower lobes. There are additional patchy areas of linear, plate-like atelectasis and/or scarring throughout bilateral lungs. No mass, consolidation or pneumothorax. No suspicious lung nodules. There multiple sub 4 mm calcified granulomas scattered throughout bilateral lungs. Musculoskeletal: The visualized soft tissues of the chest wall are grossly unremarkable. No suspicious osseous lesions. There are mild multilevel degenerative changes in the visualized spine. CT ABDOMEN PELVIS FINDINGS Hepatobiliary: The liver is normal in size. Non-cirrhotic configuration. No suspicious mass. No intrahepatic or extrahepatic bile duct dilation. No calcified gallstones. Normal gallbladder wall thickness. No pericholecystic inflammatory changes. Pancreas: Unremarkable. No pancreatic ductal dilatation or surrounding inflammatory changes. Spleen: Within normal limits. No focal lesion. Adrenals/Urinary Tract: Adrenal glands are unremarkable. Markedly small/atrophic bilateral kidneys noted with extensive vascular calcifications. No nephroureterolithiasis or obstructive uropathy. There is implanted right pelvic kidney noted. No nephroureterolithiasis or obstructive uropathy. Unremarkable urinary bladder. Stomach/Bowel: There is a small diverticulum arising from the second part of duodenum. No disproportionate dilation of the small or large bowel loops. No evidence of abnormal bowel wall thickening or inflammatory changes. The appendix is unremarkable. There are multiple diverticula mainly in the left hemi colon, without imaging signs of diverticulitis. Vascular/Lymphatic: No ascites or pneumoperitoneum. No abdominal or pelvic lymphadenopathy, by size criteria. No aneurysmal dilation of the major abdominal  arteries. There are mild peripheral atherosclerotic vascular calcifications of the aorta and its major branches. Reproductive: Normal size prostate. Symmetric seminal vesicles. Other: There is a small fat containing umbilical hernia. There is tiny fat containing right inguinal hernia and small left inguinal hernia containing fat, ascitic fluid and portion of unobstructed sigmoid colon. There is mild anasarca the soft tissues and abdominal wall are otherwise unremarkable. Musculoskeletal: No suspicious osseous lesions. There are mild multilevel degenerative  changes in the visualized spine. IMPRESSION: 1. There is small left and small-to-moderate right pleural effusion with associated compressive atelectatic changes in the bilateral lower lobes. No lung mass, consolidation or pneumothorax. No suspicious lung nodule. 2. No acute inflammatory process identified within the abdomen or pelvis. 3. Multiple other nonacute observations, as described above. Aortic Atherosclerosis (ICD10-I70.0). Electronically Signed   By: Beula Brunswick M.D.   On: 05/05/2024 15:01   CT CHEST WO CONTRAST Result Date: 05/05/2024 CLINICAL DATA:  Abdominal pain, acute, nonlocalized; Pleural effusion, malignancy suspected. EXAM: CT CHEST, ABDOMEN AND PELVIS WITHOUT CONTRAST TECHNIQUE: Multidetector CT imaging of the chest, abdomen and pelvis was performed following the standard protocol without IV contrast. RADIATION DOSE REDUCTION: This exam was performed according to the departmental dose-optimization program which includes automated exposure control, adjustment of the mA and/or kV according to patient size and/or use of iterative reconstruction technique. COMPARISON:  None Available. FINDINGS: CT CHEST FINDINGS Cardiovascular: Normal cardiac size. no pericardial effusion. No aortic aneurysm. There are coronary artery calcifications, in keeping with coronary artery disease. Mediastinum/Nodes: Visualized thyroid  gland appears grossly  unremarkable. No solid / cystic mediastinal masses. The esophagus is nondistended precluding optimal assessment. No mediastinal or axillary lymphadenopathy by size criteria. Evaluation of bilateral hila is limited due to lack on intravenous contrast: however, no large hilar lymphadenopathy identified. Lungs/Pleura: The central tracheo-bronchial tree is patent. There is small left and small-to-moderate right pleural effusion with associated compressive atelectatic changes in the bilateral lower lobes. There are additional patchy areas of linear, plate-like atelectasis and/or scarring throughout bilateral lungs. No mass, consolidation or pneumothorax. No suspicious lung nodules. There multiple sub 4 mm calcified granulomas scattered throughout bilateral lungs. Musculoskeletal: The visualized soft tissues of the chest wall are grossly unremarkable. No suspicious osseous lesions. There are mild multilevel degenerative changes in the visualized spine. CT ABDOMEN PELVIS FINDINGS Hepatobiliary: The liver is normal in size. Non-cirrhotic configuration. No suspicious mass. No intrahepatic or extrahepatic bile duct dilation. No calcified gallstones. Normal gallbladder wall thickness. No pericholecystic inflammatory changes. Pancreas: Unremarkable. No pancreatic ductal dilatation or surrounding inflammatory changes. Spleen: Within normal limits. No focal lesion. Adrenals/Urinary Tract: Adrenal glands are unremarkable. Markedly small/atrophic bilateral kidneys noted with extensive vascular calcifications. No nephroureterolithiasis or obstructive uropathy. There is implanted right pelvic kidney noted. No nephroureterolithiasis or obstructive uropathy. Unremarkable urinary bladder. Stomach/Bowel: There is a small diverticulum arising from the second part of duodenum. No disproportionate dilation of the small or large bowel loops. No evidence of abnormal bowel wall thickening or inflammatory changes. The appendix is unremarkable.  There are multiple diverticula mainly in the left hemi colon, without imaging signs of diverticulitis. Vascular/Lymphatic: No ascites or pneumoperitoneum. No abdominal or pelvic lymphadenopathy, by size criteria. No aneurysmal dilation of the major abdominal arteries. There are mild peripheral atherosclerotic vascular calcifications of the aorta and its major branches. Reproductive: Normal size prostate. Symmetric seminal vesicles. Other: There is a small fat containing umbilical hernia. There is tiny fat containing right inguinal hernia and small left inguinal hernia containing fat, ascitic fluid and portion of unobstructed sigmoid colon. There is mild anasarca the soft tissues and abdominal wall are otherwise unremarkable. Musculoskeletal: No suspicious osseous lesions. There are mild multilevel degenerative changes in the visualized spine. IMPRESSION: 1. There is small left and small-to-moderate right pleural effusion with associated compressive atelectatic changes in the bilateral lower lobes. No lung mass, consolidation or pneumothorax. No suspicious lung nodule. 2. No acute inflammatory process identified within the abdomen or pelvis. 3. Multiple  other nonacute observations, as described above. Aortic Atherosclerosis (ICD10-I70.0). Electronically Signed   By: Beula Brunswick M.D.   On: 05/05/2024 15:01     Medications:    argatroban 1 mcg/kg/min (05/07/24 1151)   cefTRIAXone (ROCEPHIN)  IV 2 g (05/07/24 1219)    brimonidine  1 drop Right Eye TID   brinzolamide  1 drop Right Eye TID   Chlorhexidine Gluconate Cloth  6 each Topical Daily   hydrocortisone sod succinate (SOLU-CORTEF) inj  100 mg Intravenous Q12H   Followed by   Cecily Cohen ON 05/08/2024] hydrocortisone sod succinate (SOLU-CORTEF) inj  50 mg Intravenous Q12H   insulin  aspart  0-9 Units Subcutaneous Q4H   insulin  glargine-yfgn  8 Units Subcutaneous Daily   metoprolol tartrate  25 mg Oral BID   mupirocin ointment  1 Application Nasal BID    mycophenolate  360 mg Oral BID   pantoprazole  40 mg Oral BID   tacrolimus   0.5 mg Oral Q1200   tacrolimus   1 mg Oral Q1200   tamsulosin  0.4 mg Oral QPC supper   acetaminophen, docusate sodium, fentaNYL (SUBLIMAZE) injection, ondansetron (ZOFRAN) IV, mouth rinse, polyethylene glycol, traZODone  Assessment/ Plan:  Mr. Rick Wang is a 68 y.o.  male   with past medical conditions including diabetes, hyperlipidemia, hypertension, chronic kidney disease stage IIIa status post living donor renal transplant 2012, who was admitted to [facility] on 05/02/2024 for Lower urinary tract infectious disease [N39.0] Body aches [R52] AKI (acute kidney injury) (HCC) [N17.9] Septic shock (HCC) [A41.9, R65.21] Severe sepsis (HCC) [A41.9, R65.20] Hypotension, unspecified hypotension type [I95.9] Sepsis, due to unspecified organism, unspecified whether acute organ dysfunction present [facility]) [A41.9]    Acute kidney injury on chronic kidney disease stage III A, status post living donor renal transplant (2012).  Patient currently follows with [facility] nephrology, Dr. Rosebud Confer.  Baseline creatinine appears to be 1.4-1.6.  Creatinine on admission 2.73.  Acute kidney injury likely secondary to concurrent illness, septic shock.   Creatinine improving. Plan to start diuretics tomorrow and rescan for possible thoracentesis. Avoid nephrotoxic agents, renally dose medications. Monitor intake/output. Will continue to monitor renal indices.    Lab Results  Component Value Date   CREATININE 3.09 (H) 05/07/2024   CREATININE 3.38 (H) 05/06/2024   CREATININE 3.90 (H) 05/05/2024        Intake/Output Summary (Last 24 hours) at 05/07/2024 1402 Last data filed at 05/07/2024 1159 Gross per 24 hour  Intake 580 ml  Output 850 ml  Net -270 ml      2.  Sepsis secondary to E. coli bacteremia UTI.  Received cefepime, ceftriaxone, vancomycin, and metronidazole.  Now prescribed ceftriaxone only. ID following.   3. Anemia of chronic kidney  disease Recent Labs           Lab Results  Component Value Date    HGB 9.6 (L) 05/07/2024    Hemoglobin stable.  No need for ESA's at this time.   4. Secondary Hyperparathyroidism: with outpatient labs: None available   Recent Labs           Lab Results  Component Value Date    CALCIUM 9.1 (L) 05/07/2024    PHOS 4.9 (H) 05/07/2024      Will continue to monitor bone minerals at this time.   LOS: 4 Breunna Nordmann P Henry Loge 5/25/20252:00 PM

## 2024-05-07 NOTE — Progress Notes (Signed)
 Mobility Specialist - Progress Note   05/07/24 0823  Mobility  Activity Ambulated with assistance in room;Stood at bedside;Dangled on edge of bed;Transferred from bed to chair  Level of Assistance Standby assist, set-up cues, supervision of patient - no hands on  Assistive Device None  Distance Ambulated (ft) 3 ft  Activity Response Tolerated well  Mobility Referral Yes  Mobility visit 1 Mobility  Mobility Specialist Start Time (ACUTE ONLY) 0758  Mobility Specialist Stop Time (ACUTE ONLY) 0813  Mobility Specialist Time Calculation (min) (ACUTE ONLY) 15 min   Pt semi-supine in bed on RA upon arrival. Pt completes bed mobility, STS, and ambulates to recliner SBA. Pt left in recliner with needs in reach and chair alarm activated.   Wash Hack  Mobility Specialist  05/07/24 8:25 AM

## 2024-05-07 NOTE — Progress Notes (Signed)
 Hypoglycemic Event  CBG: 50, recheck 54  Treatment: 2 tubes glucose gel  Symptoms: Shaky  Follow-up CBG: Time: 0959 CBG Result: 301  Possible Reasons for Event: Unknown  Comments/MD notified: MD Dell Fennel

## 2024-05-07 NOTE — Progress Notes (Signed)
 PHARMACY - ANTICOAGULATION CONSULT NOTE  Pharmacy Consult for argatroban Indication: suspected HIT  Allergies  Allergen Reactions   Heparin     Possible HIT    Patient Measurements: Height: 5\' 10"  (177.8 cm) Weight: 75.8 kg (167 lb 1.7 oz) IBW/kg (Calculated) : 73 HEPARIN DW (KG): 67.3  Vital Signs: Temp: 97.8 F (36.6 C) (05/25 0855) Temp Source: Oral (05/25 0855) BP: 119/76 (05/25 0855) Pulse Rate: 82 (05/25 0855)  Labs: Recent Labs    05/05/24 0017 05/06/24 0452 05/07/24 0525  HGB 9.6* 10.1* 9.6*  HCT 29.4* 30.5* 28.8*  PLT 57* 39* 30*  CREATININE 3.90* 3.38* 3.09*    Estimated Creatinine Clearance: 24.6 mL/min (A) (by C-G formula based on SCr of 3.09 mg/dL (H)).   Medical History: Past Medical History:  Diagnosis Date   Chronic kidney disease    Bilateral kidney failure. Rcvd new RIGHT kidney.   Diabetes mellitus without complication (HCC)    Heart murmur    followed by PCP   Hypercholesteremia    Hypertension     Medications:  Scheduled:   brimonidine  1 drop Right Eye TID   brinzolamide  1 drop Right Eye TID   hydrocortisone sod succinate (SOLU-CORTEF) inj  100 mg Intravenous Q12H   Followed by   Cecily Cohen ON 05/08/2024] hydrocortisone sod succinate (SOLU-CORTEF) inj  50 mg Intravenous Q12H   insulin  aspart  0-9 Units Subcutaneous Q4H   insulin  glargine-yfgn  15 Units Subcutaneous Daily   metoprolol tartrate  25 mg Oral BID   mupirocin ointment  1 Application Nasal BID   mycophenolate  360 mg Oral BID   pantoprazole  40 mg Oral BID   tacrolimus   0.5 mg Oral Q1200   tacrolimus   1 mg Oral Q1200   tamsulosin  0.4 mg Oral QPC supper    Assessment: 68 year old with h/o HTN, HLD, Diabetes type 1, Renal transplant 2012, CKD, DVT, Eosinophilic esophagitis with dysphagia that presented 5/20 to the ER with complaints of Fever and syncope. He has been on subQ heparin for DVT prophylaxis and now with suspected HIT  Goal of Therapy:  aPTT 50 - 90   seconds Monitor platelets by anticoagulation protocol: Yes   Plan:  Start argatroban at 1 mcg/kg/min Check aPTT 4 hours after initiation CBC once daily while on argatroban  Dionysios Massman D Mckenley Birenbaum 05/07/2024,10:40 AM

## 2024-05-07 NOTE — Progress Notes (Signed)
 PHARMACY - ANTICOAGULATION CONSULT NOTE  Pharmacy Consult for argatroban Indication: suspected HIT  Allergies  Allergen Reactions   Heparin     Possible HIT    Patient Measurements: Height: 5\' 10"  (177.8 cm) Weight: 75.8 kg (167 lb 1.7 oz) IBW/kg (Calculated) : 73 HEPARIN DW (KG): 67.3  Vital Signs: Temp: 98.1 F (36.7 C) (05/25 1551) Temp Source: Oral (05/25 1551) BP: 113/68 (05/25 1551) Pulse Rate: 67 (05/25 1551)  Labs: Recent Labs    05/05/24 0017 05/06/24 0452 05/07/24 0525 05/07/24 1501 05/07/24 1616  HGB 9.6* 10.1* 9.6* 10.0*  --   HCT 29.4* 30.5* 28.8* 30.1*  --   PLT 57* 39* 30* 30*  --   APTT  --   --   --   --  48*  CREATININE 3.90* 3.38* 3.09*  --   --     Estimated Creatinine Clearance: 24.6 mL/min (A) (by C-G formula based on SCr of 3.09 mg/dL (H)).   Medical History: Past Medical History:  Diagnosis Date   Chronic kidney disease    Bilateral kidney failure. Rcvd new RIGHT kidney.   Diabetes mellitus without complication (HCC)    Heart murmur    followed by PCP   Hypercholesteremia    Hypertension     Medications:  Scheduled:   brimonidine  1 drop Right Eye TID   brinzolamide  1 drop Right Eye TID   Chlorhexidine Gluconate Cloth  6 each Topical Daily   hydrocortisone sod succinate (SOLU-CORTEF) inj  100 mg Intravenous Q12H   Followed by   Cecily Cohen ON 05/08/2024] hydrocortisone sod succinate (SOLU-CORTEF) inj  50 mg Intravenous Q12H   insulin  aspart  0-9 Units Subcutaneous Q4H   insulin  glargine-yfgn  8 Units Subcutaneous Daily   metoprolol tartrate  25 mg Oral BID   mupirocin ointment  1 Application Nasal BID   mycophenolate  360 mg Oral BID   pantoprazole  40 mg Oral BID   tacrolimus   0.5 mg Oral Q1200   tacrolimus   1 mg Oral Q1200   tamsulosin  0.4 mg Oral QPC supper    Assessment: 68 year old with h/o HTN, HLD, Diabetes type 1, Renal transplant 2012, CKD, DVT, Eosinophilic esophagitis with dysphagia that presented 5/20 to the ER  with complaints of Fever and syncope. He has been on subQ heparin for DVT prophylaxis and now with suspected HIT  5/25 1616 aPTT=48 - subtherapeutic  Goal of Therapy:  aPTT 50 - 90  seconds Monitor platelets by anticoagulation protocol: Yes   Plan:  aPTT subtherapeutic, will increase infusion rate by 20% per policy ( 1.2 mcg/kg/min). Check aPTT 4 hours after rate change CBC once daily while on argatroban  Alik Mawson Rodriguez-Guzman PharmD, BCPS 05/07/2024 4:55 PM

## 2024-05-07 NOTE — Progress Notes (Signed)
 PHARMACY - ANTICOAGULATION CONSULT NOTE  Pharmacy Consult for argatroban Indication: suspected HIT  Allergies  Allergen Reactions   Heparin     Possible HIT    Patient Measurements: Height: 5\' 10"  (177.8 cm) Weight: 75.8 kg (167 lb 1.7 oz) IBW/kg (Calculated) : 73 HEPARIN DW (KG): 67.3  Vital Signs: Temp: 97.7 F (36.5 C) (05/25 1933) Temp Source: Oral (05/25 1933) BP: 122/75 (05/25 1933) Pulse Rate: 69 (05/25 1933)  Labs: Recent Labs    05/05/24 0017 05/06/24 0452 05/07/24 0525 05/07/24 1501 05/07/24 1616 05/07/24 2049  HGB 9.6* 10.1* 9.6* 10.0*  --   --   HCT 29.4* 30.5* 28.8* 30.1*  --   --   PLT 57* 39* 30* 30*  --   --   APTT  --   --   --   --  48* 57*  CREATININE 3.90* 3.38* 3.09*  --   --   --     Estimated Creatinine Clearance: 24.6 mL/min (A) (by C-G formula based on SCr of 3.09 mg/dL (H)).   Medical History: Past Medical History:  Diagnosis Date   Chronic kidney disease    Bilateral kidney failure. Rcvd new RIGHT kidney.   Diabetes mellitus without complication (HCC)    Heart murmur    followed by PCP   Hypercholesteremia    Hypertension     Medications:  Scheduled:   brimonidine  1 drop Right Eye TID   brinzolamide  1 drop Right Eye TID   Chlorhexidine Gluconate Cloth  6 each Topical Daily   [START ON 05/08/2024] hydrocortisone sod succinate (SOLU-CORTEF) inj  50 mg Intravenous Q12H   insulin  aspart  0-9 Units Subcutaneous Q4H   insulin  glargine-yfgn  8 Units Subcutaneous Daily   metoprolol tartrate  25 mg Oral BID   mycophenolate  360 mg Oral BID   pantoprazole  40 mg Oral BID   tacrolimus   0.5 mg Oral Q1200   tacrolimus   1 mg Oral Q1200   tamsulosin  0.4 mg Oral QPC supper   Assessment: 68 year old with h/o HTN, HLD, Diabetes type 1, Renal transplant 2012, CKD, DVT, Eosinophilic esophagitis with dysphagia that presented 5/20 to the ER with complaints of Fever and syncope. He has been on subQ heparin for DVT prophylaxis and now with  suspected HIT  5/25 1616 aPTT=48 - subtherapeutic 5/25 2049 aPTT=57 - therapeutic x1  Goal of Therapy:  aPTT 50 - 90  seconds Monitor platelets by anticoagulation protocol: Yes   Plan:  aPTT level is therapeutic at 57 seconds Continue argatroban infusion at 1.2 mcg/kg/min Check confirmatory aPTT level in 4 hours - if remains therapeutic, will switch to daily aPTT levels Check CBC once daily while on argatroban  Thank you for involving pharmacy in this patient's care.   Ananias Balls, PharmD Clinical Pharmacist 05/07/2024 9:24 PM

## 2024-05-08 ENCOUNTER — Inpatient Hospital Stay

## 2024-05-08 DIAGNOSIS — N390 Urinary tract infection, site not specified: Secondary | ICD-10-CM | POA: Diagnosis not present

## 2024-05-08 DIAGNOSIS — A419 Sepsis, unspecified organism: Secondary | ICD-10-CM | POA: Diagnosis not present

## 2024-05-08 DIAGNOSIS — B9620 Unspecified Escherichia coli [E. coli] as the cause of diseases classified elsewhere: Secondary | ICD-10-CM | POA: Diagnosis not present

## 2024-05-08 DIAGNOSIS — R7881 Bacteremia: Secondary | ICD-10-CM | POA: Diagnosis not present

## 2024-05-08 DIAGNOSIS — N179 Acute kidney failure, unspecified: Secondary | ICD-10-CM | POA: Diagnosis not present

## 2024-05-08 DIAGNOSIS — R6520 Severe sepsis without septic shock: Secondary | ICD-10-CM | POA: Diagnosis not present

## 2024-05-08 LAB — TECHNOLOGIST SMEAR REVIEW: Plt Morphology: NORMAL

## 2024-05-08 LAB — CBC WITH DIFFERENTIAL/PLATELET
Abs Immature Granulocytes: 0 10*3/uL (ref 0.00–0.07)
Basophils Absolute: 0 10*3/uL (ref 0.0–0.1)
Basophils Relative: 0 %
Eosinophils Absolute: 0 10*3/uL (ref 0.0–0.5)
Eosinophils Relative: 0 %
HCT: 29.9 % — ABNORMAL LOW (ref 39.0–52.0)
Hemoglobin: 9.8 g/dL — ABNORMAL LOW (ref 13.0–17.0)
Immature Granulocytes: 0 %
Lymphocytes Relative: 14 %
Lymphs Abs: 3.1 10*3/uL (ref 0.7–4.0)
MCH: 30.8 pg (ref 26.0–34.0)
MCHC: 32.8 g/dL (ref 30.0–36.0)
MCV: 94 fL (ref 80.0–100.0)
Monocytes Absolute: 2.7 10*3/uL — ABNORMAL HIGH (ref 0.1–1.0)
Monocytes Relative: 12 %
Neutro Abs: 16.6 10*3/uL — ABNORMAL HIGH (ref 1.7–7.7)
Neutrophils Relative %: 74 %
Platelets: 43 10*3/uL — ABNORMAL LOW (ref 150–400)
RBC: 3.18 MIL/uL — ABNORMAL LOW (ref 4.22–5.81)
RDW: 14 % (ref 11.5–15.5)
Smear Review: NORMAL
WBC: 22.4 10*3/uL — ABNORMAL HIGH (ref 4.0–10.5)
nRBC: 0 % (ref 0.0–0.2)
nRBC: 0 /100{WBCs}

## 2024-05-08 LAB — CBC
HCT: 29 % — ABNORMAL LOW (ref 39.0–52.0)
Hemoglobin: 9.8 g/dL — ABNORMAL LOW (ref 13.0–17.0)
MCH: 30.8 pg (ref 26.0–34.0)
MCHC: 33.8 g/dL (ref 30.0–36.0)
MCV: 91.2 fL (ref 80.0–100.0)
Platelets: 37 10*3/uL — ABNORMAL LOW (ref 150–400)
RBC: 3.18 MIL/uL — ABNORMAL LOW (ref 4.22–5.81)
RDW: 13.7 % (ref 11.5–15.5)
WBC: 19.6 10*3/uL — ABNORMAL HIGH (ref 4.0–10.5)
nRBC: 0.2 % (ref 0.0–0.2)

## 2024-05-08 LAB — BASIC METABOLIC PANEL WITH GFR
Anion gap: 11 (ref 5–15)
BUN: 111 mg/dL — ABNORMAL HIGH (ref 8–23)
CO2: 19 mmol/L — ABNORMAL LOW (ref 22–32)
Calcium: 8.8 mg/dL — ABNORMAL LOW (ref 8.9–10.3)
Chloride: 103 mmol/L (ref 98–111)
Creatinine, Ser: 2.67 mg/dL — ABNORMAL HIGH (ref 0.61–1.24)
GFR, Estimated: 26 mL/min — ABNORMAL LOW (ref >60)
Glucose, Bld: 174 mg/dL — ABNORMAL HIGH (ref 70–99)
Potassium: 3.7 mmol/L (ref 3.5–5.1)
Sodium: 133 mmol/L — ABNORMAL LOW (ref 135–145)

## 2024-05-08 LAB — MAGNESIUM: Magnesium: 2.5 mg/dL — ABNORMAL HIGH (ref 1.7–2.4)

## 2024-05-08 LAB — GLUCOSE, CAPILLARY
Glucose-Capillary: 171 mg/dL — ABNORMAL HIGH (ref 70–99)
Glucose-Capillary: 175 mg/dL — ABNORMAL HIGH (ref 70–99)
Glucose-Capillary: 192 mg/dL — ABNORMAL HIGH (ref 70–99)
Glucose-Capillary: 264 mg/dL — ABNORMAL HIGH (ref 70–99)
Glucose-Capillary: 267 mg/dL — ABNORMAL HIGH (ref 70–99)
Glucose-Capillary: 313 mg/dL — ABNORMAL HIGH (ref 70–99)

## 2024-05-08 LAB — PROTIME-INR
INR: 3.1 — ABNORMAL HIGH (ref 0.8–1.2)
Prothrombin Time: 31.9 s — ABNORMAL HIGH (ref 11.4–15.2)

## 2024-05-08 LAB — HEPARIN INDUCED PLATELET AB (HIT ANTIBODY): Heparin Induced Plt Ab: 0.25 {OD_unit} (ref 0.000–0.400)

## 2024-05-08 LAB — PROCALCITONIN: Procalcitonin: 8.73 ng/mL

## 2024-05-08 LAB — PHOSPHORUS: Phosphorus: 4.7 mg/dL — ABNORMAL HIGH (ref 2.5–4.6)

## 2024-05-08 LAB — APTT: aPTT: 62 s — ABNORMAL HIGH (ref 24–36)

## 2024-05-08 LAB — FIBRINOGEN: Fibrinogen: 353 mg/dL (ref 210–475)

## 2024-05-08 MED ORDER — SODIUM BICARBONATE 650 MG PO TABS
650.0000 mg | ORAL_TABLET | Freq: Three times a day (TID) | ORAL | Status: DC
  Administered 2024-05-08 – 2024-05-09 (×4): 650 mg via ORAL
  Filled 2024-05-08 (×4): qty 1

## 2024-05-08 MED ORDER — INSULIN ASPART 100 UNIT/ML IJ SOLN
3.0000 [IU] | Freq: Three times a day (TID) | INTRAMUSCULAR | Status: DC
  Administered 2024-05-08 – 2024-05-10 (×6): 3 [IU] via SUBCUTANEOUS
  Filled 2024-05-08 (×6): qty 1

## 2024-05-08 NOTE — Progress Notes (Signed)
 Date of Admission:  05/02/2024    ID: Rick Wang is a 68 y.o. male Principal Problem:   Severe sepsis (HCC) Active Problems:   E coli bacteremia   AKI (acute kidney injury) (HCC)    Subjective: Pt is doing better No fever Family at bed side-  Patient says occasionally when he closes his eyes he can see human figures No SOB  Medications:   brimonidine  1 drop Right Eye TID   brinzolamide  1 drop Right Eye TID   Chlorhexidine Gluconate Cloth  6 each Topical Daily   hydrocortisone sod succinate (SOLU-CORTEF) inj  50 mg Intravenous Q12H   insulin  aspart  0-9 Units Subcutaneous Q4H   insulin  aspart  3 Units Subcutaneous TID WC   insulin  glargine-yfgn  8 Units Subcutaneous Daily   metoprolol tartrate  25 mg Oral BID   mycophenolate  360 mg Oral BID   pantoprazole  40 mg Oral BID   sodium bicarbonate  650 mg Oral TID   tacrolimus   0.5 mg Oral Q1200   tacrolimus   1 mg Oral Q1200   tamsulosin  0.4 mg Oral QPC supper    Objective: Vital signs in last 24 hours: Patient Vitals for the past 24 hrs:  BP Temp Temp src Pulse Resp SpO2 Weight  05/08/24 0843 132/71 97.6 F (36.4 C) -- 75 18 99 % --  05/08/24 0403 -- -- -- -- -- -- 74.8 kg  05/08/24 0355 134/71 97.9 F (36.6 C) Oral 65 18 94 % --  05/07/24 1933 122/75 97.7 F (36.5 C) Oral 69 17 96 % --       PHYSICAL EXAM:  General: Alert, cooperative,  Lungs: b/l air entry- Heart:s1s2 Abdomen: Soft, non-tender,not distended. Bowel sounds normal. No masses Extremities: atraumatic, no cyanosis. No edema. No clubbing Skin: No rashes or lesions. Or bruising Lymph: Cervical, supraclavicular normal. Neurologic: Grossly non-focal  Lab Results    Latest Ref Rng & Units 05/08/2024    6:47 AM 05/08/2024    1:15 AM 05/07/2024    3:01 PM  CBC  WBC 4.0 - 10.5 K/uL 22.4  19.6  17.9   Hemoglobin 13.0 - 17.0 g/dL 9.8  9.8  16.1   Hematocrit 39.0 - 52.0 % 29.9  29.0  30.1   Platelets 150 - 400 K/uL 43  37  30        Latest  Ref Rng & Units 05/08/2024    1:15 AM 05/07/2024    5:25 AM 05/06/2024    4:52 AM  CMP  Glucose 70 - 99 mg/dL 096  76  045   BUN 8 - 23 mg/dL 409  811  914   Creatinine 0.61 - 1.24 mg/dL 7.82  9.56  2.13   Sodium 135 - 145 mmol/L 133  140  138   Potassium 3.5 - 5.1 mmol/L 3.7  3.7  4.3   Chloride 98 - 111 mmol/L 103  105  107   CO2 22 - 32 mmol/L 19  26  18    Calcium 8.9 - 10.3 mg/dL 8.8  9.1  9.1       Microbiology: 5/20 both sets blood culture positive for Ecoli UC 5/21- Ecoli Studies/Results: DG Chest Port 1 View Result Date: 05/08/2024 CLINICAL DATA:  Sepsis. EXAM: PORTABLE CHEST 1 VIEW COMPARISON:  05/04/2024 FINDINGS: The cardiopericardial silhouette is within normal limits for size. Interval improvement in aeration the right base with decreasing right pleural effusion. Left lung clear. No acute bony abnormality.  Telemetry leads overlie the chest. IMPRESSION: Interval improvement in aeration at the right base with decreasing right pleural effusion. Electronically Signed   By: Donnal Fusi M.D.   On: 05/08/2024 09:17     Assessment/Plan: Ecoli bacteremia with ecoli UTI- on ceftriaxone- pan sensitive organism Leucocytosis persist , though cr has improved, procal has improved a lot CT was done and it does not show any pathology in the transplant kidney Repeat Blood culture sent ON  Ceftriaxone On discharge can change to Po amoxicllin adjusted to crcl for a total of 14 days  B/l pleural effusion left > rt  Worsening renal function- Nephrology on board Now improved  Renal transplant Is on tapering steroids- restarted immunesuppressive therapy  Thrombocytopenia   Type I DM on insulin   Discussed the management with patient and his wife and sister at bed side  Discussed with hospitalist

## 2024-05-08 NOTE — Inpatient Diabetes Management (Signed)
 Inpatient Diabetes Program Recommendations  AACE/ADA: New Consensus Statement on Inpatient Glycemic Control (2015)  Target Ranges:  Prepandial:   less than 140 mg/dL      Peak postprandial:   less than 180 mg/dL (1-2 hours)      Critically ill patients:  140 - 180 mg/dL    Latest Reference Range & Units 05/07/24 01:18 05/07/24 04:27 05/07/24 08:49 05/07/24 09:16 05/07/24 09:59 05/07/24 11:55 05/07/24 15:48 05/07/24 19:34  Glucose-Capillary 70 - 99 mg/dL 161 (H) 87 50 (L) 54 (L) 111 (H) 269 (H)  5 units Novolog   8 units Semglee 301 (H)  7 units Novolog  @1709  323 (H)  7 units Novolog      Latest Reference Range & Units 05/07/24 23:20 05/08/24 03:56 05/08/24 08:41  Glucose-Capillary 70 - 99 mg/dL 096 (H)  2 units Novolog   175 (H)  2 units Novolog   171 (H)  (H): Data is abnormally high    History: Type 1 diabetes  Home DM Meds: Omni Pod Insulin  Pump  Current Orders: Semglee 8 units daily     Novolog  Sensitive Correction Scale/ SSI (0-9 units) Q4 hours     Solucortef 50 mg BID    Hypoglycemia yest AM--Semglee dose reduced to 8 units daily  Note Solucortef reduced to 50 mg BID (was 100 mg BID)  MD- Please consider starting Novolog  Meal Coverage: Novolog  3 units TID with meals HOLD if pt NPO HOLD if pt eats <50% meals    Pump Settings Basal rates MN = 0.6 TDD basal: 14.4 units  Bolus settings I/C: 12 ISF: 59 Target Glucose: 120 Active insulin  time: 3 hours     --Will follow patient during hospitalization--  Langston Pippins RN, MSN, CDCES Diabetes Coordinator Inpatient Glycemic Control Team Team Pager: [phone] (8a-5p)

## 2024-05-08 NOTE — Progress Notes (Signed)
 [facility] Progress Note  Patient: Rick Wang    ZOX:[id number]  DOA: 05/02/2024     Date of Service: the patient was seen and examined on 05/08/2024  Chief Complaint  Patient presents with   Fever   Near Syncope   Brief hospital course: Rick Wang is 68 year old with h/o HTN, HLD, Diabetes type 1, Renal transplant 2012, CKD, DVT, Eosinophilic esophagitis with dysphagia that presented 5/20 to the ER with complaints of Fever and syncope. To review, patient received Hep B vaccine Monday, on Tuesday developed Malaise, poor appetite, fever, nausea, confusion. Wife states that he was standing and his legs gave out, she assisted him to the ground. EMS was called, upon arrival to the ER BP 113/92, HR 114, Temp 102.6. Labs revealed chronic Leukopenia 1.5, hgb 10.3, Plts 112, BUN 43, creat 2.07, Lactic acid 5.2, PT 17, INR 1.4, Beta hydroxybutyrate negative, RVP negative. UA+, culture pending, BC x 2, and he was started on Cefepime and Vancomycin. He was given 30 ml/kg IVF bolus. With ongoing hypotension he was given and additional 1 liter (3 Liters total) and started on Levophed infusion. He is now admitted to the ICU for further management of his septic shock.   5/21: Admitted to ICU with septic shock due to UTI. BC x 2, RVP negative, Urine culture pending, Cefepime + Vancomycin. S/p 3 Liters IVF. Starting insulin  drip and discontinuing patients pump.  05/04/24-patient had insomnia overnight, on 2L/min [place].  S/p ID eval.  Remains oliguric and has been seen by nephrology. Has dysphagia and is for SLP.  Remains off vasopressors.  Transferred to [facility] on 5/23  Assessment and Plan:   # Septic Shock secondary to UTI # Immunocompromised, chronic # Lactic Acidosis # Hypotension - s/p  Cefepime + Vancomycin - s/p 3 liters IVFB, and  IVF @ 150 ml/hr, s/p Levophed infusion - due to Persistent hypotension stress dose steroids started in the ICU, started tapering dose 5/21 Urine culture growing E.  coli pansensitive 5/20 blood culture growing E. Coli, pansensitive 5/21 started ceftriaxone 2 g IV daily 5/23 repeat blood culture <12 hr NGTD CT a/p: No acute inflammatory process identified within the abdomen or pelvis. 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 No lung mass, consolidation or pneumothorax. No suspicious lung nodule. 5/25 supplemental O2 admission has been weaned off, saturating well on room air 5/26 CXR: Pleural effusion almost resolved.  # Acute on Chronic Kidney Disease  # Renal Transplant 2012 @ [facility] - Reach out to Transplant team in AM - Daily BMP - Avoid nephrotoxic agents - held home Mycophenolate and Tacrolimus  on admission - Fluid resuscitation  - Renal US  5/23 as nephro: Will restart tacrolimus  dismiss at half of home dose, 1 mg in a.m. and 0.5 mg in evening.   5/24 restarted Myfortic. Spoke with Dr Helga Loan, who agrees with treatment plan.  Cr 2.67  improving  # Metabolic acidosis secondary to AKI/CKD 5/24 CO2 18, bicarb 100 mEq IV one-time dose given followed by oral supplement. Monitor chemistry daily   # Urinary retention, Foley catheter was inserted on 5/23, around 400 mL urine was collected immediately after Foley catheter insertion Started Flomax Keep Foley  for few days and then DC before discharge    # Diabetes Type 1, utilizes insulin  pump Removed Insulin  pump and Endotool was started in the ICU Continue Semglee 8 units daily, started NovoLog  3 units 3 times daily Continue sliding scale monitor CBG Continue diabetic diet Diabetic coordinator consulted   # Hypertension # Hyperlipidemia Antihypertensive medications were held due to sepsis and hypotension Continue to hold statin for now 5/23 BP improved, patient is hypertensive Patient was on amlodipine 5 mg and lisinopril 40 mg at home Started Lopressor 25 mg p.o. twice daily with holding parameters    # Thrombocytopenia # Anemia Hb 9.6--10.1 Platelets 112>>30>>37k - Monitor for bleeding - Daily CBC   # Eosinophilic Esophagitis w/ Dysphagia Patient is s/p recent EGD with biopsy's - Continue home PPI -SLP evaluation done, no oropharyngeal dysphagia.   Body mass index is 23.66 kg/m.  Interventions:  Diet: Dysphagia 3 diet with thin liquids DVT Prophylaxis: Subcutaneous Heparin    Advance goals of care discussion: Full code  Family Communication: family was present at bedside, at the time of interview.  The pt provided permission to discuss medical plan with the family. Opportunity was given to ask question and all questions were answered satisfactorily.   Disposition:  Pt is from home, admitted with sepsis due to UTI, still on IV Abx, which precludes a safe discharge. Discharge to Home, when stable and cleared by ID and nephrology.  Subjective: No significant events overnight, patient was sitting only on the recliner, denied any active issues, no chest pain or crepitus, no shortness of breath.  Physical Exam: General: NAD, lying comfortably Appear in no distress, affect appropriate Eyes: PERRLA ENT: Oral Mucosa Clear, moist  Neck: no JVD,  Cardiovascular: S1 and S2 Present, no Murmur,  Respiratory: Equal air entry bilaterally, bibasilar crackles, no wheezes   Abdomen: Bowel Sound present, Soft and no tenderness,  Skin: no rashes Extremities: no Pedal edema, no calf tenderness Neurologic: without any new focal findings Gait not checked due to patient safety concerns  Vitals:   05/07/24 1933 05/08/24 0355 05/08/24 0403 05/08/24 0843  BP: 122/75 134/71  132/71  Pulse: 69 65  75  Resp: 17 18  18   Temp: 97.7 F (36.5 C) 97.9 F (36.6 C)  97.6 F (36.4 C)  TempSrc: Oral Oral    SpO2: 96% 94%  99%  Weight:   74.8 kg   Height:        Intake/Output Summary (Last 24 hours) at 05/08/2024 1440 Last data filed at 05/08/2024 1029 Gross per 24 hour  Intake 547.8 ml  Output 600 ml  Net -52.2 ml   Filed Weights   05/06/24 0300 05/07/24 0500 05/08/24 0403  Weight: 73.2 kg 75.8 kg 74.8 kg    Data Reviewed: I have personally reviewed and interpreted daily labs, tele strips, imagings as discussed above. I reviewed all nursing notes, pharmacy notes, vitals, pertinent old records I have discussed plan of care as described above with RN and patient/family.  CBC: Recent Labs  Lab 05/02/24 2157 05/03/24 0628 05/06/24 0452 05/07/24 0525 05/07/24 1501 05/08/24 0115 05/08/24 0647  WBC 1.5*   < > 21.2* 16.2* 17.9* 19.6* 22.4*  NEUTROABS  0.9*  --   --   --   --   --  16.6*  HGB 10.3*   < > 10.1* 9.6* 10.0* 9.8* 9.8*  HCT 33.8*   < > 30.5* 28.8* 30.1* 29.0* 29.9*  MCV 101.5*   < > 92.1 91.4 92.0 91.2 94.0  PLT 112*   < > 39* 30* 30* 37* 43*   < > = values in this interval not displayed.   Basic Metabolic Panel: Recent Labs  Lab 05/04/24 0308 05/04/24 8119 05/05/24 0017 05/06/24 0452 05/07/24 0525 05/08/24 0115  NA 139 139 138 138 140 133*  K 5.6* 4.4 4.5 4.3 3.7 3.7  CL 108 107 108 107 105 103  CO2 20* 19* 21* 18* 26 19*  GLUCOSE 147* 124* 188* 120* 76 174*  BUN 63* 64* 84* 105* 112* 111*  CREATININE 3.76* 3.77* 3.90* 3.38* 3.09* 2.67*  CALCIUM 8.5* 8.2* 8.2* 9.1 9.1 8.8*  MG 2.1  --  2.3 2.4 2.6* 2.5*  PHOS 5.8*  --  5.8* 5.5*  4.9* 4.7*    Studies: DG Chest Port 1 View Result Date: 05/08/2024 CLINICAL DATA:  Sepsis. EXAM: PORTABLE CHEST 1 VIEW COMPARISON:  05/04/2024 FINDINGS: The cardiopericardial silhouette is within normal limits for size. Interval improvement in aeration the right base with decreasing right pleural effusion. Left lung clear. No acute bony abnormality. Telemetry leads overlie the chest. IMPRESSION: Interval improvement in aeration at the right base with decreasing right pleural effusion. Electronically Signed   By: Donnal Fusi M.D.   On: 05/08/2024 09:17     Scheduled Meds:  brimonidine  1 drop Right Eye TID   brinzolamide  1 drop Right Eye TID   Chlorhexidine Gluconate Cloth  6 each Topical Daily   hydrocortisone sod succinate (SOLU-CORTEF) inj  50 mg Intravenous Q12H   insulin  aspart  0-9 Units Subcutaneous Q4H   insulin  aspart  3 Units Subcutaneous TID WC   insulin  glargine-yfgn  8 Units Subcutaneous Daily   metoprolol tartrate  25 mg Oral BID   mycophenolate  360 mg Oral BID   pantoprazole  40 mg Oral BID   sodium bicarbonate  650 mg Oral TID   tacrolimus   0.5 mg Oral Q1200   tacrolimus   1 mg Oral Q1200   tamsulosin  0.4 mg Oral QPC supper   Continuous Infusions:  argatroban 1.2 mcg/kg/min (05/08/24 1049)   cefTRIAXone (ROCEPHIN)  IV 2 g (05/08/24 1151)   PRN Meds: acetaminophen, docusate sodium, fentaNYL (SUBLIMAZE) injection, ondansetron (ZOFRAN) IV, mouth rinse, polyethylene glycol, traZODone  Time spent: 55 minutes  Author: Althia Atlas. MD [facility] 05/08/2024 2:40 PM  To reach On-call, see care teams to locate the attending and reach out to them via [email or web address]. If 7PM-7AM, please contact night-coverage If you still have difficulty reaching the attending provider, please page the [facility] (Director on Call) for [facility] on amion for assistance.

## 2024-05-08 NOTE — Progress Notes (Signed)
 Mobility Specialist - Progress Note   05/08/24 1137  Mobility  Activity Ambulated with assistance in hallway;Transferred from bed to chair  Level of Assistance Standby assist, set-up cues, supervision of patient - no hands on  Assistive Device Front wheel walker  Distance Ambulated (ft) 320 ft  Activity Response Tolerated well  Mobility visit 1 Mobility  Mobility Specialist Start Time (ACUTE ONLY) 1117  Mobility Specialist Stop Time (ACUTE ONLY) 1135  Mobility Specialist Time Calculation (min) (ACUTE ONLY) 18 min   Pt supine upon entry, utilizing RA. Pt agreeable to OOB amb this date, denies pain. Pt completed bed mob ModI, STS to RW CGA and amb two laps around the NS with MinG-SBA-- tolerated well. Pt returned to the room, left seated in the recliner with alarm set and needs within reach. NT notified.  Versa Gore Mobility Specialist 05/08/24 11:39 AM

## 2024-05-08 NOTE — Progress Notes (Signed)
 PHARMACY - ANTICOAGULATION CONSULT NOTE  Pharmacy Consult for argatroban Indication: suspected HIT  Allergies  Allergen Reactions   Heparin     Possible HIT    Patient Measurements: Height: 5\' 10"  (177.8 cm) Weight: 75.8 kg (167 lb 1.7 oz) IBW/kg (Calculated) : 73 HEPARIN DW (KG): 67.3  Vital Signs: Temp: 97.7 F (36.5 C) (05/25 1933) Temp Source: Oral (05/25 1933) BP: 122/75 (05/25 1933) Pulse Rate: 69 (05/25 1933)  Labs: Recent Labs    05/06/24 0452 05/07/24 0525 05/07/24 1501 05/07/24 1616 05/07/24 2049 05/08/24 0115  HGB 10.1* 9.6* 10.0*  --   --   --   HCT 30.5* 28.8* 30.1*  --   --   --   PLT 39* 30* 30*  --   --   --   APTT  --   --   --  48* 57* 62*  CREATININE 3.38* 3.09*  --   --   --   --     Estimated Creatinine Clearance: 24.6 mL/min (A) (by C-G formula based on SCr of 3.09 mg/dL (H)).   Medical History: Past Medical History:  Diagnosis Date   Chronic kidney disease    Bilateral kidney failure. Rcvd new RIGHT kidney.   Diabetes mellitus without complication (HCC)    Heart murmur    followed by PCP   Hypercholesteremia    Hypertension     Medications:  Scheduled:   brimonidine  1 drop Right Eye TID   brinzolamide  1 drop Right Eye TID   Chlorhexidine Gluconate Cloth  6 each Topical Daily   hydrocortisone sod succinate (SOLU-CORTEF) inj  50 mg Intravenous Q12H   insulin  aspart  0-9 Units Subcutaneous Q4H   insulin  glargine-yfgn  8 Units Subcutaneous Daily   metoprolol tartrate  25 mg Oral BID   mycophenolate  360 mg Oral BID   pantoprazole  40 mg Oral BID   tacrolimus   0.5 mg Oral Q1200   tacrolimus   1 mg Oral Q1200   tamsulosin  0.4 mg Oral QPC supper   Assessment: 68 year old with h/o HTN, HLD, Diabetes type 1, Renal transplant 2012, CKD, DVT, Eosinophilic esophagitis with dysphagia that presented 5/20 to the ER with complaints of Fever and syncope. He has been on subQ heparin for DVT prophylaxis and now with suspected HIT  5/25  1616 aPTT=48 - subtherapeutic 5/25 2049 aPTT=57 - therapeutic x1 5/26 0115 aPTT=62 - therapeutic X 2   Goal of Therapy:  aPTT 50 - 90  seconds Monitor platelets by anticoagulation protocol: Yes   Plan:  5/26:  aPTT @ 0115 = 62, therapeutic X 2  Continue argatroban infusion at 1.2 mcg/kg/min Recheck aPTT on 5/27 with AM labs  Check CBC once daily while on argatroban  Thank you for involving pharmacy in this patient's care.   Jadwiga Faidley D Clinical Pharmacist 05/08/2024 1:42 AM

## 2024-05-08 NOTE — Progress Notes (Signed)
 [facility]  ROUNDING NOTE   Subjective:   Wife at bedside.   UOP Creatinine 2.67 (3.09)  Patient reports eating better. Has no complaints this morning  Argatroban gtt. Platelets 37K (30K)   Objective:  Vital signs in last 24 hours:  Temp:  [97.6 F (36.4 C)-98.1 F (36.7 C)] 97.6 F (36.4 C) (05/26 0843) Pulse Rate:  [65-75] 75 (05/26 0843) Resp:  [17-18] 18 (05/26 0843) BP: (113-134)/(68-75) 132/71 (05/26 0843) SpO2:  [94 %-99 %] 99 % (05/26 0843) Weight:  [74.8 kg] 74.8 kg (05/26 0403)  Weight change: -1 kg Filed Weights   05/06/24 0300 05/07/24 0500 05/08/24 0403  Weight: 73.2 kg 75.8 kg 74.8 kg    Intake/Output: I/O last 3 completed shifts: In: 680.9 [P.O.:460; I.V.:20.9; IV Piggyback:200.1] Out: 1450 [Urine:1450]   Intake/Output this shift:  Total I/O In: 426.9 [P.O.:330; I.V.:96.9] Out: -   Physical Exam: General: NAD, laying in bed  Head: Normocephalic, atraumatic. Moist oral mucosal membranes  Eyes: Anicteric, PERRL  Neck: Supple, trachea midline  Lungs:  Clear to auscultation  Heart: Regular rate and rhythm  Abdomen:  Soft, nontender,   Extremities: No peripheral edema.  Neurologic: Nonfocal, moving all four extremities  Skin: No lesions  Access: None    Basic Metabolic Panel: Recent Labs  Lab 05/04/24 0308 05/04/24 0712 05/05/24 0017 05/06/24 0452 05/07/24 0525 05/08/24 0115  NA 139 139 138 138 140 133*  K 5.6* 4.4 4.5 4.3 3.7 3.7  CL 108 107 108 107 105 103  CO2 20* 19* 21* 18* 26 19*  GLUCOSE 147* 124* 188* 120* 76 174*  BUN 63* 64* 84* 105* 112* 111*  CREATININE 3.76* 3.77* 3.90* 3.38* 3.09* 2.67*  CALCIUM 8.5* 8.2* 8.2* 9.1 9.1 8.8*  MG 2.1  --  2.3 2.4 2.6* 2.5*  PHOS 5.8*  --  5.8* 5.5* 4.9* 4.7*    Liver Function Tests: Recent Labs  Lab 05/02/24 2157  AST 42*  ALT 25  ALKPHOS 64  BILITOT 1.3*  PROT 6.4*  ALBUMIN 3.3*   No results for input(s): "LIPASE", "AMYLASE" in the last 168 hours. No  results for input(s): "AMMONIA" in the last 168 hours.  CBC: Recent Labs  Lab 05/02/24 2157 05/03/24 0628 05/05/24 0017 05/06/24 0452 05/07/24 0525 05/07/24 1501 05/08/24 0115  WBC 1.5*   < > 17.3* 21.2* 16.2* 17.9* 19.6*  NEUTROABS 0.9*  --   --   --   --   --   --   HGB 10.3*   < > 9.6* 10.1* 9.6* 10.0* 9.8*  HCT 33.8*   < > 29.4* 30.5* 28.8* 30.1* 29.0*  MCV 101.5*   < > 95.1 92.1 91.4 92.0 91.2  PLT 112*   < > 57* 39* 30* 30* 37*   < > = values in this interval not displayed.    Cardiac Enzymes: No results for input(s): "CKTOTAL", "CKMB", "CKMBINDEX", "TROPONINI" in the last 168 hours.  BNP: Invalid input(s): "POCBNP"  CBG: Recent Labs  Lab 05/07/24 1548 05/07/24 1934 05/07/24 2320 05/08/24 0356 05/08/24 0841  GLUCAP 301* 323* 177* 175* 171*    Microbiology: Results for orders placed or performed during the hospital encounter of 05/02/24  Culture, blood (Routine x 2)     Status: Abnormal   Collection Time: 05/02/24  9:57 PM   Specimen: BLOOD  Result Value Ref Range Status   Specimen Description   Final    BLOOD BLOOD RIGHT ARM Performed at [facility], [address] [place], [place] [address]    Special Requests   Final    BOTTLES DRAWN AEROBIC AND ANAEROBIC Blood Culture adequate volume Performed at [facility], [address] [place], [place] [address]    Culture  Setup Time   Final    GRAM NEGATIVE RODS IN BOTH AEROBIC AND ANAEROBIC BOTTLES CRITICAL VALUE NOTED.  VALUE IS CONSISTENT WITH PREVIOUSLY REPORTED AND CALLED VALUE. Performed at [facility], [address] [place], [place] [address]    Culture (A)  Final    ESCHERICHIA COLI SUSCEPTIBILITIES PERFORMED ON PREVIOUS CULTURE WITHIN THE LAST 5 DAYS. Performed at [facility], [address] [place], [place] [address]    Report Status 05/05/2024 FINAL  Final  Culture, blood (Routine x 2)     Status: Abnormal   Collection Time: 05/02/24  9:58 PM    Specimen: BLOOD  Result Value Ref Range Status   Specimen Description   Final    BLOOD BLOOD RIGHT ARM Performed at [facility], [address], [place], [place] [address]    Special Requests   Final    BOTTLES DRAWN AEROBIC AND ANAEROBIC Blood Culture adequate volume Performed at [facility], [address] [place], [place] [address]    Culture  Setup Time   Final    GRAM NEGATIVE RODS IN BOTH AEROBIC AND ANAEROBIC BOTTLES CRITICAL RESULT CALLED TO, READ BACK BY AND VERIFIED WITH: SHEEMA HALLAJI PHARMD 1020 05/03/24 HNM    Culture ESCHERICHIA COLI (A)  Final   Report Status 05/05/2024 FINAL  Final   Organism ID, Bacteria ESCHERICHIA COLI  Final   Organism ID, Bacteria ESCHERICHIA COLI  Final      Susceptibility   Escherichia coli - KIRBY BAUER*    CEFAZOLIN SENSITIVE Sensitive    Escherichia coli - MIC*    AMPICILLIN 4 SENSITIVE Sensitive     CEFEPIME <=0.12 SENSITIVE Sensitive     CEFTAZIDIME <=1 SENSITIVE Sensitive     CEFTRIAXONE <=0.25 SENSITIVE Sensitive     CIPROFLOXACIN <=0.25 SENSITIVE Sensitive     GENTAMICIN <=1 SENSITIVE Sensitive     IMIPENEM <=0.25 SENSITIVE Sensitive     TRIMETH/SULFA <=20 SENSITIVE Sensitive     AMPICILLIN/SULBACTAM <=2 SENSITIVE Sensitive     PIP/TAZO <=4 SENSITIVE Sensitive ug/mL    * ESCHERICHIA COLI    ESCHERICHIA COLI  Blood Culture ID Panel (Reflexed)     Status: Abnormal   Collection Time: 05/02/24  9:58 PM  Result Value Ref Range Status   Enterococcus faecalis NOT DETECTED NOT DETECTED Final   Enterococcus Faecium NOT DETECTED NOT DETECTED Final   Listeria monocytogenes NOT DETECTED NOT DETECTED Final   Staphylococcus species NOT DETECTED NOT DETECTED Final   Staphylococcus aureus (BCID) NOT DETECTED NOT DETECTED Final   Staphylococcus epidermidis NOT DETECTED NOT DETECTED Final   Staphylococcus lugdunensis NOT DETECTED NOT DETECTED Final   Streptococcus species NOT DETECTED NOT DETECTED Final   Streptococcus  agalactiae NOT DETECTED NOT DETECTED Final   Streptococcus pneumoniae NOT DETECTED NOT DETECTED Final   Streptococcus pyogenes NOT DETECTED NOT DETECTED Final   A.calcoaceticus-baumannii NOT DETECTED NOT DETECTED Final   Bacteroides fragilis NOT DETECTED NOT DETECTED Final   Enterobacterales DETECTED (A) NOT DETECTED Final    Comment: Enterobacterales represent a large order of gram negative bacteria, not a single organism. CRITICAL RESULT CALLED TO, READ BACK BY AND VERIFIED WITH: SHEEMA HALLAJI PHARMD 1020 05/03/24 HNM    Enterobacter cloacae complex NOT  DETECTED NOT DETECTED Final   Escherichia coli DETECTED (A) NOT DETECTED Final    Comment: CRITICAL RESULT CALLED TO, READ BACK BY AND VERIFIED WITH: SHEEMA HALLAJI PHARMD 1020 05/03/24 HNM    Klebsiella aerogenes NOT DETECTED NOT DETECTED Final   Klebsiella oxytoca NOT DETECTED NOT DETECTED Final   Klebsiella pneumoniae NOT DETECTED NOT DETECTED Final   Proteus species NOT DETECTED NOT DETECTED Final   Salmonella species NOT DETECTED NOT DETECTED Final   Serratia marcescens NOT DETECTED NOT DETECTED Final   Haemophilus influenzae NOT DETECTED NOT DETECTED Final   Neisseria meningitidis NOT DETECTED NOT DETECTED Final   Pseudomonas aeruginosa NOT DETECTED NOT DETECTED Final   Stenotrophomonas maltophilia NOT DETECTED NOT DETECTED Final   Candida albicans NOT DETECTED NOT DETECTED Final   Candida auris NOT DETECTED NOT DETECTED Final   Candida glabrata NOT DETECTED NOT DETECTED Final   Candida krusei NOT DETECTED NOT DETECTED Final   Candida parapsilosis NOT DETECTED NOT DETECTED Final   Candida tropicalis NOT DETECTED NOT DETECTED Final   Cryptococcus neoformans/gattii NOT DETECTED NOT DETECTED Final   CTX-M ESBL NOT DETECTED NOT DETECTED Final   Carbapenem resistance IMP NOT DETECTED NOT DETECTED Final   Carbapenem resistance KPC NOT DETECTED NOT DETECTED Final   Carbapenem resistance NDM NOT DETECTED NOT DETECTED Final    Carbapenem resist OXA 48 LIKE NOT DETECTED NOT DETECTED Final   Carbapenem resistance VIM NOT DETECTED NOT DETECTED Final    Comment: Performed at [facility], [address] [place], [place] [address]  Resp panel by RT-PCR (RSV, Flu A&B, Covid) Anterior Nasal Swab     Status: None   Collection Time: 05/02/24 11:17 PM   Specimen: Anterior Nasal Swab  Result Value Ref Range Status   SARS Coronavirus 2 by RT PCR NEGATIVE NEGATIVE Final    Comment: (NOTE) SARS-CoV-2 target nucleic acids are NOT DETECTED.  The SARS-CoV-2 RNA is generally detectable in upper respiratory specimens during the acute phase of infection. The lowest concentration of SARS-CoV-2 viral copies this assay can detect is 138 copies/mL. A negative result does not preclude SARS-Cov-2 infection and should not be used as the sole basis for treatment or other patient management decisions. A negative result may occur with  improper specimen collection/handling, submission of specimen other than nasopharyngeal swab, presence of viral mutation(s) within the areas targeted by this assay, and inadequate number of viral copies(<138 copies/mL). A negative result must be combined with clinical observations, patient history, and epidemiological information. The expected result is Negative.  Fact Sheet for Patients:  BloggerCourse.com  Fact Sheet for Healthcare Providers:  SeriousBroker.it  This test is no t yet approved or cleared by the [place]  FDA and  has been authorized for detection and/or diagnosis of SARS-CoV-2 by FDA under an Emergency Use Authorization (EUA). This EUA will remain  in effect (meaning this test can be used) for the duration of the COVID-19 declaration under Section 564(b)(1) of the Act, 21 U.S.C.section 360bbb-3(b)(1), unless the authorization is terminated  or revoked sooner.       Influenza A by PCR NEGATIVE NEGATIVE Final    Influenza B by PCR NEGATIVE NEGATIVE Final    Comment: (NOTE) The Xpert Xpress SARS-CoV-2/FLU/RSV plus assay is intended as an aid in the diagnosis of influenza from Nasopharyngeal swab specimens and should not be used as a sole basis for treatment. Nasal washings and aspirates are unacceptable for Xpert Xpress SARS-CoV-2/FLU/RSV testing.  Fact Sheet for Patients: BloggerCourse.com  Fact Sheet  for Healthcare Providers: SeriousBroker.it  This test is not yet approved or cleared by the [place]  FDA and has been authorized for detection and/or diagnosis of SARS-CoV-2 by FDA under an Emergency Use Authorization (EUA). This EUA will remain in effect (meaning this test can be used) for the duration of the COVID-19 declaration under Section 564(b)(1) of the Act, 21 U.S.C. section 360bbb-3(b)(1), unless the authorization is terminated or revoked.     Resp Syncytial Virus by PCR NEGATIVE NEGATIVE Final    Comment: (NOTE) Fact Sheet for Patients: BloggerCourse.com  Fact Sheet for Healthcare Providers: SeriousBroker.it  This test is not yet approved or cleared by the [place]  FDA and has been authorized for detection and/or diagnosis of SARS-CoV-2 by FDA under an Emergency Use Authorization (EUA). This EUA will remain in effect (meaning this test can be used) for the duration of the COVID-19 declaration under Section 564(b)(1) of the Act, 21 U.S.C. section 360bbb-3(b)(1), unless the authorization is terminated or revoked.  Performed at [facility], [address] [place], [place] [address]   Urine Culture     Status: Abnormal   Collection Time: 05/03/24 12:18 AM   Specimen: Urine, Random  Result Value Ref Range Status   Specimen Description   Final    URINE, RANDOM Performed at [facility], [address] [place], [place] [address]    Special  Requests   Final    NONE Reflexed from [phone] [id number] Performed at [facility], [address] [place], [place] [address]    Culture >=100,000 COLONIES/mL ESCHERICHIA COLI (A)  Final   Report Status 05/05/2024 FINAL  Final   Organism ID, Bacteria ESCHERICHIA COLI (A)  Final      Susceptibility   Escherichia coli - MIC*    AMPICILLIN 4 SENSITIVE Sensitive     CEFAZOLIN <=4 SENSITIVE Sensitive     CEFEPIME <=0.12 SENSITIVE Sensitive     CEFTRIAXONE <=0.25 SENSITIVE Sensitive     CIPROFLOXACIN <=0.25 SENSITIVE Sensitive     GENTAMICIN <=1 SENSITIVE Sensitive     IMIPENEM <=0.25 SENSITIVE Sensitive     NITROFURANTOIN <=16 SENSITIVE Sensitive     TRIMETH/SULFA <=20 SENSITIVE Sensitive     AMPICILLIN/SULBACTAM <=2 SENSITIVE Sensitive     PIP/TAZO <=4 SENSITIVE Sensitive ug/mL    * >=100,000 COLONIES/mL ESCHERICHIA COLI  MRSA Next Gen by PCR, Nasal     Status: Abnormal   Collection Time: 05/03/24  4:00 AM   Specimen: Nasal Mucosa; Nasal Swab  Result Value Ref Range Status   MRSA by PCR Next Gen DETECTED (A) NOT DETECTED Final    Comment: RESULT CALLED TO, READ BACK BY AND VERIFIED WITH: Salina Craver RN [phone] 05/03/24 HNM (NOTE) The GeneXpert MRSA Assay (FDA approved for NASAL specimens only), is one component of a comprehensive MRSA colonization surveillance program. It is not intended to diagnose MRSA infection nor to guide or monitor treatment for MRSA infections. Test performance is not FDA approved in patients less than 9 years old. Performed at [facility], [address] [place], [place] [address]   Culture, blood (Routine X 2) w Reflex to ID Panel     Status: None (Preliminary result)   Collection Time: 05/05/24 12:17 AM   Specimen: BLOOD RIGHT ARM  Result Value Ref Range Status   Specimen Description BLOOD RIGHT ARM  Final   Special Requests   Final    BOTTLES DRAWN AEROBIC AND ANAEROBIC Blood Culture adequate volume   Culture   Final  NO GROWTH 3  DAYS Performed at [facility], [address] [place], [place] [address]    Report Status PENDING  Incomplete  Culture, blood (Routine X 2) w Reflex to ID Panel     Status: None (Preliminary result)   Collection Time: 05/05/24 12:17 AM   Specimen: BLOOD LEFT HAND  Result Value Ref Range Status   Specimen Description BLOOD LEFT HAND  Final   Special Requests   Final    BOTTLES DRAWN AEROBIC AND ANAEROBIC Blood Culture adequate volume   Culture   Final    NO GROWTH 3 DAYS Performed at [facility], [address] [place], [place] [address]    Report Status PENDING  Incomplete    Coagulation Studies: Recent Labs    05/08/24 0947  LABPROT 31.9*  INR 3.1*    Urinalysis: No results for input(s): "COLORURINE", "LABSPEC", "PHURINE", "GLUCOSEU", "HGBUR", "BILIRUBINUR", "KETONESUR", "PROTEINUR", "UROBILINOGEN", "NITRITE", "LEUKOCYTESUR" in the last 72 hours.  Invalid input(s): "APPERANCEUR"    Imaging: DG Chest Port 1 View Result Date: 05/08/2024 CLINICAL DATA:  Sepsis. EXAM: PORTABLE CHEST 1 VIEW COMPARISON:  05/04/2024 FINDINGS: The cardiopericardial silhouette is within normal limits for size. Interval improvement in aeration the right base with decreasing right pleural effusion. Left lung clear. No acute bony abnormality. Telemetry leads overlie the chest. IMPRESSION: Interval improvement in aeration at the right base with decreasing right pleural effusion. Electronically Signed   By: Donnal Fusi M.D.   On: 05/08/2024 09:17     Medications:    argatroban 1.2 mcg/kg/min (05/08/24 1049)   cefTRIAXone (ROCEPHIN)  IV Stopped (05/07/24 1251)    brimonidine  1 drop Right Eye TID   brinzolamide  1 drop Right Eye TID   Chlorhexidine Gluconate Cloth  6 each Topical Daily   hydrocortisone sod succinate (SOLU-CORTEF) inj  50 mg Intravenous Q12H   insulin  aspart  0-9 Units Subcutaneous Q4H   insulin  aspart  3 Units Subcutaneous TID WC   insulin  glargine-yfgn  8  Units Subcutaneous Daily   metoprolol tartrate  25 mg Oral BID   mycophenolate  360 mg Oral BID   pantoprazole  40 mg Oral BID   sodium bicarbonate  650 mg Oral TID   tacrolimus   0.5 mg Oral Q1200   tacrolimus   1 mg Oral Q1200   tamsulosin  0.4 mg Oral QPC supper   acetaminophen, docusate sodium, fentaNYL (SUBLIMAZE) injection, ondansetron (ZOFRAN) IV, mouth rinse, polyethylene glycol, traZODone  Assessment/ Plan:  Rick Wang is a 69 y.o.  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  Patient currently follows with [facility] nephrology, Dr. Helga Loan.  Baseline creatinine  1.4-1.6.  Creatinine peaked at 3.9. Acute kidney injury likely secondary to concurrent illness, septic shock and hypovolemia.   - Continue tacrolimus  and mycophenolate - acute kidney injury complicated by acute metabolic acidosis: continue sodium bicarbonate - BUN remains elevated. Suspect partially due to systemic steroids. Continue to wean hydrocortisone   Lab Results  Component Value  Date   CREATININE 2.67 (H) 05/08/2024   CREATININE 3.09 (H) 05/07/2024   CREATININE 3.38 (H) 05/06/2024        Intake/Output Summary (Last 24 hours) at 05/08/2024 1101 Last data filed at 05/08/2024 1029 Gross per 24 hour  Intake 767.8 ml  Output 1450 ml  Net -682.2 ml      2.  Sepsis secondary to E. coli bacteremia UTI.   Pansensitive  - indwelling foley catheter placed  - Appreciate ID input   3. Anemia of chronic kidney disease Recent Labs           Lab Results  Component Value Date    HGB 9.6 (L) 05/07/2024    Hemoglobin stable.  .   4. Secondary Hyperparathyroidism:     Recent Labs           Lab Results  Component Value Date    CALCIUM 9.1 (L) 05/07/2024    PHOS 4.9 (H) 05/07/2024      Will continue to monitor bone minerals at this time.   LOS: 5 Sharlisa Hollifield 5/26/202511:01 AM

## 2024-05-09 DIAGNOSIS — D696 Thrombocytopenia, unspecified: Secondary | ICD-10-CM

## 2024-05-09 DIAGNOSIS — N390 Urinary tract infection, site not specified: Secondary | ICD-10-CM | POA: Diagnosis not present

## 2024-05-09 DIAGNOSIS — D72829 Elevated white blood cell count, unspecified: Secondary | ICD-10-CM

## 2024-05-09 DIAGNOSIS — R7881 Bacteremia: Secondary | ICD-10-CM | POA: Diagnosis not present

## 2024-05-09 DIAGNOSIS — R6520 Severe sepsis without septic shock: Secondary | ICD-10-CM | POA: Diagnosis not present

## 2024-05-09 DIAGNOSIS — B9620 Unspecified Escherichia coli [E. coli] as the cause of diseases classified elsewhere: Secondary | ICD-10-CM | POA: Diagnosis not present

## 2024-05-09 DIAGNOSIS — A419 Sepsis, unspecified organism: Secondary | ICD-10-CM | POA: Diagnosis not present

## 2024-05-09 DIAGNOSIS — J90 Pleural effusion, not elsewhere classified: Secondary | ICD-10-CM

## 2024-05-09 LAB — CBC
HCT: 28.2 % — ABNORMAL LOW (ref 39.0–52.0)
Hemoglobin: 9.4 g/dL — ABNORMAL LOW (ref 13.0–17.0)
MCH: 30.4 pg (ref 26.0–34.0)
MCHC: 33.3 g/dL (ref 30.0–36.0)
MCV: 91.3 fL (ref 80.0–100.0)
Platelets: 63 10*3/uL — ABNORMAL LOW (ref 150–400)
RBC: 3.09 MIL/uL — ABNORMAL LOW (ref 4.22–5.81)
RDW: 14.1 % (ref 11.5–15.5)
WBC: 20.7 10*3/uL — ABNORMAL HIGH (ref 4.0–10.5)
nRBC: 0 % (ref 0.0–0.2)

## 2024-05-09 LAB — BASIC METABOLIC PANEL WITH GFR
Anion gap: 14 (ref 5–15)
BUN: 111 mg/dL — ABNORMAL HIGH (ref 8–23)
CO2: 19 mmol/L — ABNORMAL LOW (ref 22–32)
Calcium: 9 mg/dL (ref 8.9–10.3)
Chloride: 103 mmol/L (ref 98–111)
Creatinine, Ser: 2.39 mg/dL — ABNORMAL HIGH (ref 0.61–1.24)
GFR, Estimated: 29 mL/min — ABNORMAL LOW (ref >60)
Glucose, Bld: 239 mg/dL — ABNORMAL HIGH (ref 70–99)
Potassium: 3.9 mmol/L (ref 3.5–5.1)
Sodium: 136 mmol/L (ref 135–145)

## 2024-05-09 LAB — GLUCOSE, CAPILLARY
Glucose-Capillary: 216 mg/dL — ABNORMAL HIGH (ref 70–99)
Glucose-Capillary: 231 mg/dL — ABNORMAL HIGH (ref 70–99)
Glucose-Capillary: 259 mg/dL — ABNORMAL HIGH (ref 70–99)
Glucose-Capillary: 326 mg/dL — ABNORMAL HIGH (ref 70–99)
Glucose-Capillary: 211 mg/dL — ABNORMAL HIGH (ref 70–99)
Glucose-Capillary: 146 mg/dL — ABNORMAL HIGH (ref 70–99)

## 2024-05-09 LAB — MAGNESIUM: Magnesium: 2.5 mg/dL — ABNORMAL HIGH (ref 1.7–2.4)

## 2024-05-09 LAB — PHOSPHORUS: Phosphorus: 4.8 mg/dL — ABNORMAL HIGH (ref 2.5–4.6)

## 2024-05-09 LAB — APTT: aPTT: 56 s — ABNORMAL HIGH (ref 24–36)

## 2024-05-09 MED ORDER — TACROLIMUS 1 MG PO CAPS
1.0000 mg | ORAL_CAPSULE | Freq: Every day | ORAL | Status: DC
  Administered 2024-05-09 – 2024-05-10 (×2): 1 mg via ORAL
  Filled 2024-05-09 (×3): qty 1

## 2024-05-09 MED ORDER — TACROLIMUS 1 MG PO CAPS
2.0000 mg | ORAL_CAPSULE | Freq: Every day | ORAL | Status: DC
  Administered 2024-05-09 – 2024-05-10 (×2): 2 mg via ORAL
  Filled 2024-05-09 (×3): qty 2

## 2024-05-09 MED ORDER — SODIUM BICARBONATE 8.4 % IV SOLN
100.0000 meq | Freq: Once | INTRAVENOUS | Status: AC
  Administered 2024-05-09: 100 meq via INTRAVENOUS
  Filled 2024-05-09: qty 50

## 2024-05-09 MED ORDER — AMOXICILLIN 500 MG PO CAPS
1000.0000 mg | ORAL_CAPSULE | Freq: Three times a day (TID) | ORAL | Status: DC
  Administered 2024-05-10 – 2024-05-11 (×4): 1000 mg via ORAL
  Filled 2024-05-09 (×5): qty 2

## 2024-05-09 MED ORDER — SODIUM BICARBONATE 650 MG PO TABS
650.0000 mg | ORAL_TABLET | Freq: Three times a day (TID) | ORAL | Status: DC
  Administered 2024-05-10 – 2024-05-11 (×4): 650 mg via ORAL
  Filled 2024-05-09 (×4): qty 1

## 2024-05-09 NOTE — Progress Notes (Signed)
 Physical Therapy Treatment Patient Details Name: Rick Wang MRN: [id number] DOB: 01-Nov-1956 Today's Date: 05/09/2024   History of Present Illness Pt is a 68 y/o male presented to ED on 05/02/24 after being found on floor by EMS. Admitted for septic shock due to UTI. PMH: T1DM, hx of kidney transplant, CKD, DVT, HTN, esophagitis with dysphagia.    PT Comments  Pt was pleasant and motivated to participate during the session and put forth good effort throughout. Pt required no physical assistance during the session with only min verbal cues for general sequencing with functional tasks.  Pt was generally steady with all standing functional activities with a RW with no overt LOB and during below balance therex pt presented with only min sway/instability at times, most notably when standing with feet together and semi-tandem with EC, but was able to self-correct without assist.  Pt reported no adverse symptoms during the session with SpO2 in the low 90s and HR WNL throughout on room air.  Pt will benefit from continued PT services upon discharge to safely address deficits listed in patient problem list for decreased caregiver assistance and eventual return to PLOF.        If plan is discharge home, recommend the following: A little help with walking and/or transfers;A little help with bathing/dressing/bathroom;Assistance with cooking/housework;Direct supervision/assist for medications management;Direct supervision/assist for financial management;Assist for transportation;Help with stairs or ramp for entrance;Supervision due to cognitive status   Can travel by private vehicle        Equipment Recommendations  Rolling walker (2 wheels)    Recommendations for Other Services       Precautions / Restrictions Precautions Precautions: Fall Restrictions Weight Bearing Restrictions Per Provider Order: No     Mobility  Bed Mobility Overal bed mobility: Modified Independent              General bed mobility comments: Min extra time and effort only    Transfers Overall transfer level: Needs assistance Equipment used: Rolling walker (2 wheels) Transfers: Sit to/from Stand Sit to Stand: Supervision           General transfer comment: Good concentric and eccentric control and stability    Ambulation/Gait Ambulation/Gait assistance: Supervision Gait Distance (Feet): 200 Feet x 2  Assistive device: Rolling walker (2 wheels) Gait Pattern/deviations: Step-through pattern, Decreased stride length, Trunk flexed Gait velocity: decreased     General Gait Details: Min decreased cadence and bilateral step length but stead with no overt LOB or adverse symptoms   Stairs             Wheelchair Mobility     Tilt Bed    Modified Rankin (Stroke Patients Only)       Balance Overall balance assessment: Needs assistance   Sitting balance-Leahy Scale: Normal     Standing balance support: During functional activity, No upper extremity supported Standing balance-Leahy Scale: Good                              Communication Communication Communication: No apparent difficulties  Cognition Arousal: Alert Behavior During Therapy: WFL for tasks assessed/performed   PT - Cognitive impairments: No apparent impairments                         Following commands: Intact Following commands impaired: Only follows one step commands consistently    Cueing Cueing Techniques: Verbal cues  Exercises Other Exercises Other  Exercises: Static standing balance traininig without UE support with feet togther and semi-tandem with combinations of EO/EC and head still/head turns Other Exercises: Dynamic standing balance traininig without UE support with feet togther and semi-tandem with reaching activities outside BOS    General Comments        Pertinent Vitals/Pain Pain Assessment Pain Assessment: No/denies pain    Home Living                           Prior Function            PT Goals (current goals can now be found in the care plan section) Progress towards PT goals: Progressing toward goals    Frequency    Min 2X/week      PT Plan      Co-evaluation              AM-PAC PT "6 Clicks" Mobility   Outcome Measure  Help needed turning from your back to your side while in a flat bed without using bedrails?: A Little Help needed moving from lying on your back to sitting on the side of a flat bed without using bedrails?: A Little Help needed moving to and from a bed to a chair (including a wheelchair)?: A Little Help needed standing up from a chair using your arms (e.g., wheelchair or bedside chair)?: A Little Help needed to walk in hospital room?: A Little Help needed climbing 3-5 steps with a railing? : A Little 6 Click Score: 18    End of Session Equipment Utilized During Treatment: Gait belt Activity Tolerance: Patient tolerated treatment well Patient left: in chair;with chair alarm set;with call bell/phone within reach;with family/visitor present Nurse Communication: Mobility status PT Visit Diagnosis: Unsteadiness on feet (R26.81);Muscle weakness (generalized) (M62.81);Difficulty in walking, not elsewhere classified (R26.2)     Time: 1610-9604 PT Time Calculation (min) (ACUTE ONLY): 30 min  Charges:    $Gait Training: 8-22 mins $Therapeutic Exercise: 8-22 mins PT General Charges $$ ACUTE PT VISIT: 1 Visit                     D. Scott Leonte Horrigan PT, DPT 05/09/24, 3:38 PM

## 2024-05-09 NOTE — Inpatient Diabetes Management (Signed)
 Inpatient Diabetes Program Recommendations  AACE/ADA: New Consensus Statement on Inpatient Glycemic Control (2015)  Target Ranges:  Prepandial:   less than 140 mg/dL      Peak postprandial:   less than 180 mg/dL (1-2 hours)      Critically ill patients:  140 - 180 mg/dL    Latest Reference Range & Units 05/07/24 23:20 05/08/24 03:56 05/08/24 08:41 05/08/24 11:44 05/08/24 16:48 05/08/24 20:04  Glucose-Capillary 70 - 99 mg/dL 161 (H)  2 units Novolog   175 (H)  2 units Novolog   171 (H)  2 units Novolog   8 units Semglee  264 (H)  8 units Novolog   313 (H)  10 units Novolog   267 (H)  5 units Novolog    (H): Data is abnormally high  Latest Reference Range & Units 05/08/24 23:40 05/09/24 04:04 05/09/24 08:27  Glucose-Capillary 70 - 99 mg/dL 096 (H)  2 units Novolog   216 (H)  3 units Novolog   231 (H)  6 units Novolog   8 units Semglee  (H): Data is abnormally high  History: Type 1 diabetes   Home DM Meds: Omni Pod Insulin  Pump   Current Orders: Semglee 8 units daily                           Novolog  Sensitive Correction Scale/ SSI (0-9 units) Q4 hours     Novolog  3 units TID with meals                               MD- Note Solucortef Stopped CBG 231 this AM CBGs elevated yesterday afternoon as well  Please consider:  1. Increase Semglee slightly to 10 units daily  2. Increase Novolog  Meal Coverage to 4 units TID with meals     --Will follow patient during hospitalization--  Langston Pippins RN, MSN, CDCES Diabetes Coordinator Inpatient Glycemic Control Team Team Pager: [phone] (8a-5p)

## 2024-05-09 NOTE — Progress Notes (Signed)
 Occupational Therapy Treatment Patient Details Name: WILDON CUEVAS MRN: [id number] DOB: 08/16/1956 Today's Date: 05/09/2024   History of present illness 68 y/o male presented to ED on 05/02/24 after being found on floor by EMS. Admitted for septic shock due to UTI. PMH: T1DM, hx of kidney transplant, CKD, DVT, HTN, esophagitis with dysphagia.   OT comments  Pt seen for OT treatment on this date. Upon arrival to room pt seated in recliner chair with IV in place, agreeable to tx and patient's sister present. Patient doffed/donned sock while seated with SBA.  Pt requires CGA for sit to stand transfer to RW.  Therapist facilitated light dynamic standing balance tasks at RW level with patient completing functional reach forward with CGA. Therapist facilitated functional mobility forward/backward at RW level with patient requiring CGA to Min A due to patient appearing to attempt to sit prior to reaching the chair with cuing for safety awareness for self and safe use of RW  Pt making good progress toward goals, will continue to follow POC. Discussed home safety and DME benefits at home.  At end of session, patient seated in recliner, BLEs elevated, chair alarm on, call button in place, and patient's family member (sister) present.       If plan is discharge home, recommend the following:  A little help with walking and/or transfers;A little help with bathing/dressing/bathroom;Assistance with cooking/housework;Assist for transportation;Help with stairs or ramp for entrance;Direct supervision/assist for medications management;Direct supervision/assist for financial management;Supervision due to cognitive status   Equipment Recommendations  BSC/3in1;Other (comment);Tub/shower seat    Recommendations for Other Services      Precautions / Restrictions Precautions Precautions: Fall Restrictions Weight Bearing Restrictions Per Provider Order: No       Mobility Bed Mobility               General  bed mobility comments: NT- pt up in recliner pre/post session    Transfers Overall transfer level: Needs assistance Equipment used: Rolling walker (2 wheels) Transfers: Sit to/from Stand Sit to Stand: Contact guard assist           General transfer comment: increased time, patient with unsteady gait to take steps backwards to find chair     Balance Overall balance assessment: Needs assistance Sitting-balance support: Bilateral upper extremity supported, Feet supported Sitting balance-Leahy Scale: Good     Standing balance support: During functional activity, Single extremity supported Standing balance-Leahy Scale: Fair Standing balance comment: CGA with intermittent UE support with dynamic standing balance.                           ADL either performed or assessed with clinical judgement   ADL Overall ADL's : Needs assistance/impaired                         Toilet Transfer: Minimal assistance Toilet Transfer Details (indicate cue type and reason): simulation, limitations in safety awareness and decreased righting reactions in standing                Extremity/Trunk Assessment Upper Extremity Assessment Upper Extremity Assessment: Generalized weakness            Vision Baseline Vision/History:  (is at baseline limitations in depth perception) Patient Visual Report: No change from baseline     Perception     Praxis     Communication Communication Communication: No apparent difficulties   Cognition Arousal: Alert Behavior During Therapy:  WFL for tasks assessed/performed Cognition: Cognition impaired (limitations with safety awareness)                               Following commands: Impaired (inconsistent with following single step instructions) Following commands impaired: Follows one step commands inconsistently      Cueing      Exercises Other Exercises Other Exercises: Education on benefits of shower  chair/seat or tub transfer bench, grab bars at tub/shower entrance to reduce risk of falls at home.    Shoulder Instructions       General Comments      Pertinent Vitals/ Pain          Home Living                                          Prior Functioning/Environment              Frequency  Min 2X/week        Progress Toward Goals  OT Goals(current goals can now be found in the care plan section)  Progress towards OT goals: Progressing toward goals     Plan      Co-evaluation                 AM-PAC OT "6 Clicks" Daily Activity     Outcome Measure   Help from another person eating meals?: None Help from another person taking care of personal grooming?: None Help from another person toileting, which includes using toliet, bedpan, or urinal?: A Little Help from another person bathing (including washing, rinsing, drying)?: A Little Help from another person to put on and taking off regular upper body clothing?: A Little Help from another person to put on and taking off regular lower body clothing?: A Little 6 Click Score: 20    End of Session Equipment Utilized During Treatment: Gait belt;Rolling walker (2 wheels)  OT Visit Diagnosis: Unsteadiness on feet (R26.81);Muscle weakness (generalized) (M62.81)   Activity Tolerance Patient tolerated treatment well   Patient Left in chair;with call bell/phone within reach;with chair alarm set;with family/visitor present   Nurse Communication          Time: 4098-1191 OT Time Calculation (min): 31 min  Charges: OT General Charges $OT Visit: 1 Visit  Sherial Dimes OTR/L   Lucas Rushing 05/09/2024, 11:24 AM

## 2024-05-09 NOTE — Progress Notes (Signed)
 PHARMACY - ANTICOAGULATION CONSULT NOTE  Pharmacy Consult for argatroban Indication: suspected HIT  Allergies  Allergen Reactions   Heparin     Possible HIT    Patient Measurements: Height: 5\' 10"  (177.8 cm) Weight: 79 kg (174 lb 2.6 oz) IBW/kg (Calculated) : 73 HEPARIN DW (KG): 67.3  Vital Signs: Temp: 97.9 F (36.6 C) (05/27 0406) Temp Source: Oral (05/26 1933) BP: 140/70 (05/27 0406) Pulse Rate: 80 (05/27 0406)  Labs: Recent Labs    05/07/24 0525 05/07/24 1501 05/07/24 2049 05/08/24 0115 05/08/24 0647 05/08/24 0947 05/09/24 0431  HGB 9.6*   < >  --  9.8* 9.8*  --  9.4*  HCT 28.8*   < >  --  29.0* 29.9*  --  28.2*  PLT 30*   < >  --  37* 43*  --  63*  APTT  --    < > 57* 62*  --   --  56*  LABPROT  --   --   --   --   --  31.9*  --   INR  --   --   --   --   --  3.1*  --   CREATININE 3.09*  --   --  2.67*  --   --   --    < > = values in this interval not displayed.    Estimated Creatinine Clearance: 28.5 mL/min (A) (by C-G formula based on SCr of 2.67 mg/dL (H)).   Medical History: Past Medical History:  Diagnosis Date   Chronic kidney disease    Bilateral kidney failure. Rcvd new RIGHT kidney.   Diabetes mellitus without complication (HCC)    Heart murmur    followed by PCP   Hypercholesteremia    Hypertension     Medications:  Scheduled:   brimonidine  1 drop Right Eye TID   brinzolamide  1 drop Right Eye TID   Chlorhexidine Gluconate Cloth  6 each Topical Daily   insulin  aspart  0-9 Units Subcutaneous Q4H   insulin  aspart  3 Units Subcutaneous TID WC   insulin  glargine-yfgn  8 Units Subcutaneous Daily   metoprolol tartrate  25 mg Oral BID   mycophenolate  360 mg Oral BID   pantoprazole  40 mg Oral BID   sodium bicarbonate  650 mg Oral TID   tacrolimus   0.5 mg Oral Q1200   tacrolimus   1 mg Oral Q1200   tamsulosin  0.4 mg Oral QPC supper   Assessment: 68 year old with h/o HTN, HLD, Diabetes type 1, Renal transplant 2012, CKD, DVT,  Eosinophilic esophagitis with dysphagia that presented 5/20 to the ER with complaints of Fever and syncope. He has been on subQ heparin for DVT prophylaxis and now with suspected HIT  5/25 1616 aPTT=48 - subtherapeutic 5/25 2049 aPTT=57 - therapeutic x1 5/26 0115 aPTT=62 - therapeutic X 2  5/27 0520 aPTT=56, therapeutic x 3  Goal of Therapy:  aPTT 50 - 90  seconds Monitor platelets by anticoagulation protocol: Yes   Plan:  Continue argatroban infusion at 1.2 mcg/kg/min Recheck aPTT on 5/28 with AM labs  Check CBC once daily while on argatroban  Thank you for involving pharmacy in this patient's care.   Coretta Dexter, PharmD, MBA 05/09/2024 5:20 AM

## 2024-05-09 NOTE — Progress Notes (Signed)
 [facility] Progress Note  Patient: Rick Wang    ZOX:[id number]  DOA: 05/02/2024     Date of Service: the patient was seen and examined on 05/09/2024  Chief Complaint  Patient presents with   Fever   Near Syncope   Brief hospital course: Rick Wang is 68 year old with h/o HTN, HLD, Diabetes type 1, Renal transplant 2012, CKD, DVT, Eosinophilic esophagitis with dysphagia that presented 5/20 to the ER with complaints of Fever and syncope. To review, patient received Hep B vaccine Monday, on Tuesday developed Malaise, poor appetite, fever, nausea, confusion. Wife states that he was standing and his legs gave out, she assisted him to the ground. EMS was called, upon arrival to the ER BP 113/92, HR 114, Temp 102.6. Labs revealed chronic Leukopenia 1.5, hgb 10.3, Plts 112, BUN 43, creat 2.07, Lactic acid 5.2, PT 17, INR 1.4, Beta hydroxybutyrate negative, RVP negative. UA+, culture pending, BC x 2, and he was started on Cefepime  and Vancomycin . He was given 30 ml/kg IVF bolus. With ongoing hypotension he was given and additional 1 liter (3 Liters total) and started on Levophed  infusion. He is now admitted to the ICU for further management of his septic shock.   5/21: Admitted to ICU with septic shock due to UTI. BC x 2, RVP negative, Urine culture pending, Cefepime  + Vancomycin . S/p 3 Liters IVF. Starting insulin  drip and discontinuing patients pump.  05/04/24-patient had insomnia overnight, on 2L/min [place].  S/p ID eval.  Remains oliguric and has been seen by nephrology. Has dysphagia and is for SLP.  Remains off vasopressors.  Transferred to [facility] on 5/23  Assessment and Plan:   # Septic Shock secondary to UTI # Immunocompromised, chronic # Lactic Acidosis # Hypotension - s/p  Cefepime  + Vancomycin  - s/p 3 liters IVFB, and  IVF @ 150 ml/hr, s/p Levophed  infusion - due to Persistent hypotension stress dose steroids started in the ICU, started tapering dose 5/21 Urine culture growing E.  coli pansensitive 5/20 blood culture growing E. Coli, pansensitive 5/21 started ceftriaxone  2 g IV daily 5/23 repeat blood culture <12 hr NGTD CT a/p: No acute inflammatory process identified within the abdomen or pelvis. Still elevated WBC count and procalcitonin is elevated ID following 5/26 follow repeat blood cultures NGTD   # Thrombocytopenia, HIT screen negative T4 score 6, highly suspect.  Pharmacist consulted, s/p Argatroban  IV gtt, d/c'd on 5/27 As per ID ruled out DIC. Fibrinogen  level 353 within normal range, peripheral smear morphology normal, no schistocytes.  INR 3.1, PTT 31.9 elevated could be due to argatroban  Platelet 37>>63K stable Monitor CBC  # Acute hypoxic respiratory failure: Resolved  secondary to pulmonary edema and pleural effusion secondary to fluid resuscitation CT Chest: small left and small-to-moderate right pleural effusion W/ compressive atelectatic changes in the bilateral lower lobes. No lung mass, consolidation or pneumothorax. No suspicious lung nodule. 5/25 supplemental O2 admission has been weaned off, saturating well on room air 5/26 CXR: Pleural effusion almost resolved.  # Acute on Chronic Kidney Disease  # Renal Transplant 2012 @ [facility] reached out to Transplant team, and resumed tacrolimus  and mycophenolate .  - s/p Fluid resuscitation  - Avoid nephrotoxic agents - Renal US : Unremarkable right lower quadrant renal transplant.  5/23 Nephro restarted tacrolimus  at half dose 1 mg in a.m. and 0.5 mg in p.m. 5/24 restarted Myfortic . Spoke with Dr Helga Loan, who agrees with treatment plan.  5/27 resumed by Nephro: Tacrolimus  2 mg in the a.m. and  1 mg in the p.m. Cr 2.39 improving  # Metabolic acidosis secondary to AKI/CKD 5/24 CO2 18, bicarb 100 mEq IV one-time dose given followed by oral supplement. 5/27 bicarb 19, bicarbonate IV 100 mEq given.  Followed by oral supplement Monitor chemistry daily   # Urinary retention, Foley catheter was  inserted on 5/23, around 400 mL urine was collected immediately after Foley catheter insertion Started Flomax Keep Foley for few days and then DC before discharge    # Diabetes Type 1, utilizes insulin  pump Removed Insulin  pump and Endotool was started in the ICU Continue Semglee 8 units daily, started NovoLog  3 units 3 times daily Continue sliding scale monitor CBG Continue diabetic diet Diabetic coordinator consulted   # Hypertension # Hyperlipidemia Antihypertensive medications were held due to sepsis and hypotension Continue to hold statin for now 5/23 BP improved, patient is hypertensive Patient was on amlodipine 5 mg and lisinopril 40 mg at home Started Lopressor 25 mg p.o. twice daily with holding parameters    # Thrombocytopenia # Anemia Hb 9.6--10.1 Platelets 112>>30>>37k - Monitor for bleeding - Daily CBC   # Eosinophilic Esophagitis w/ Dysphagia Patient is s/p recent EGD with biopsy's - Continue home PPI -SLP evaluation done, no oropharyngeal dysphagia.   Body mass index is 24.99 kg/m.  Interventions:  Diet: Dysphagia 3 diet with thin liquids DVT Prophylaxis: Subcutaneous Heparin    Advance goals of care discussion: Full code  Family Communication: family was present at bedside, at the time of interview.  The pt provided permission to discuss medical plan with the family. Opportunity was given to ask question and all questions were answered satisfactorily.   Disposition:  Pt is from home, admitted with sepsis due to UTI, still on IV Abx, which precludes a safe discharge. Discharge to Home, when stable and cleared by ID and nephrology.  Subjective: No significant events overnight, patient was sitting only on the recliner, denied any active issues, no chest pain or crepitus, no shortness of breath.  Physical Exam: General: NAD, lying comfortably Appear in no distress, affect appropriate Eyes: PERRLA ENT: Oral Mucosa Clear, moist  Neck: no JVD,   Cardiovascular: S1 and S2 Present, no Murmur,  Respiratory: Equal air entry bilaterally, bibasilar crackles, no wheezes  Abdomen: Bowel Sound present, Soft and no tenderness,  Skin: no rashes Extremities: no Pedal edema, no calf tenderness Neurologic: without any new focal findings Gait not checked due to patient safety concerns  Vitals:   05/09/24 0406 05/09/24 0428 05/09/24 0829 05/09/24 1115  BP: (!) 140/70  134/68   Pulse: 80  84 66  Resp:   18   Temp: 97.9 F (36.6 C)  98 F (36.7 C)   TempSrc:   Oral   SpO2: 94%  96% 97%  Weight:  79 kg    Height:        Intake/Output Summary (Last 24 hours) at 05/09/2024 1542 Last data filed at 05/09/2024 1500 Gross per 24 hour  Intake 594.29 ml  Output 900 ml  Net -305.71 ml   Filed Weights   05/07/24 0500 05/08/24 0403 05/09/24 0428  Weight: 75.8 kg 74.8 kg 79 kg    Data Reviewed: I have personally reviewed and interpreted daily labs, tele strips, imagings as discussed above. I reviewed all nursing notes, pharmacy notes, vitals, pertinent old records I have discussed plan of care as described above with RN and patient/family.  CBC: Recent Labs  Lab 05/02/24 2157 05/03/24 1610 05/07/24 0525 05/07/24 1501 05/08/24 0115  05/08/24 0647 05/09/24 0431  WBC 1.5*   < > 16.2* 17.9* 19.6* 22.4* 20.7*  NEUTROABS 0.9*  --   --   --   --  16.6*  --   HGB 10.3*   < > 9.6* 10.0* 9.8* 9.8* 9.4*  HCT 33.8*   < > 28.8* 30.1* 29.0* 29.9* 28.2*  MCV 101.5*   < > 91.4 92.0 91.2 94.0 91.3  PLT 112*   < > 30* 30* 37* 43* 63*   < > = values in this interval not displayed.   Basic Metabolic Panel: Recent Labs  Lab 05/05/24 0017 05/06/24 0452 05/07/24 0525 05/08/24 0115 05/09/24 0431  NA 138 138 140 133* 136  K 4.5 4.3 3.7 3.7 3.9  CL 108 107 105 103 103  CO2 21* 18* 26 19* 19*  GLUCOSE 188* 120* 76 174* 239*  BUN 84* 105* 112* 111* 111*  CREATININE 3.90* 3.38* 3.09* 2.67* 2.39*  CALCIUM 8.2* 9.1 9.1 8.8* 9.0  MG 2.3 2.4 2.6*  2.5* 2.5*  PHOS 5.8* 5.5* 4.9* 4.7* 4.8*    Studies: No results found.    Scheduled Meds:  brimonidine   1 drop Right Eye TID   brinzolamide   1 drop Right Eye TID   Chlorhexidine  Gluconate Cloth  6 each Topical Daily   insulin  aspart  0-9 Units Subcutaneous Q4H   insulin  aspart  3 Units Subcutaneous TID WC   insulin  glargine-yfgn  8 Units Subcutaneous Daily   metoprolol  tartrate  25 mg Oral BID   mycophenolate   360 mg Oral BID   pantoprazole   40 mg Oral BID   [START ON 05/10/2024] sodium bicarbonate   650 mg Oral TID   tacrolimus   1 mg Oral Q1200   tacrolimus   2 mg Oral Q1200   tamsulosin   0.4 mg Oral QPC supper   Continuous Infusions:  cefTRIAXone  (ROCEPHIN )  IV Stopped (05/09/24 1357)   PRN Meds: acetaminophen , docusate sodium , fentaNYL  (SUBLIMAZE ) injection, ondansetron  (ZOFRAN ) IV, mouth rinse, polyethylene glycol, traZODone   Time spent: 55 minutes  Author: Althia Atlas. MD [facility] 05/09/2024 3:42 PM  To reach On-call, see care teams to locate the attending and reach out to them via [email or web address]. If 7PM-7AM, please contact night-coverage If you still have difficulty reaching the attending provider, please page the [facility] (Director on Call) for [facility] on amion for assistance.

## 2024-05-09 NOTE — Progress Notes (Signed)
 [facility]  ROUNDING NOTE   Subjective:   Patient seen laying in bed Family friend at bedside Reports he feels better   UOP overnight  Creatinine 2.39 (2.67) (3.09)  Patient reports eating better. Has no complaints this morning  Argatroban gtt. Platelets 63K (37K) (30K)   Objective:  Vital signs in last 24 hours:  Temp:  [97.8 F (36.6 C)-98 F (36.7 C)] 98 F (36.7 C) (05/27 0829) Pulse Rate:  [66-84] 66 (05/27 1115) Resp:  [17-18] 18 (05/27 0829) BP: (102-140)/(56-70) 134/68 (05/27 0829) SpO2:  [93 %-97 %] 97 % (05/27 1115) Weight:  [79 kg] 79 kg (05/27 0428)  Weight change: 4.2 kg Filed Weights   05/07/24 0500 05/08/24 0403 05/09/24 0428  Weight: 75.8 kg 74.8 kg 79 kg    Intake/Output: I/O last 3 completed shifts: In: 627.6 [P.O.:330; I.V.:197.6; IV Piggyback:100] Out: 1500 [Urine:1500]   Intake/Output this shift:  Total I/O In: 240 [P.O.:240] Out: -   Physical Exam: General: NAD, laying in bed  Head: Normocephalic, atraumatic. Moist oral mucosal membranes  Eyes: Anicteric  Neck: Supple, trachea midline  Lungs:  Clear to auscultation  Heart: Regular rate and rhythm  Abdomen:  Soft, nontender,   Extremities: No peripheral edema.  Neurologic: Alert and oriented, moving all four extremities  Skin: No lesions  Access: None    Basic Metabolic Panel: Recent Labs  Lab 05/05/24 0017 05/06/24 0452 05/07/24 0525 05/08/24 0115 05/09/24 0431  NA 138 138 140 133* 136  K 4.5 4.3 3.7 3.7 3.9  CL 108 107 105 103 103  CO2 21* 18* 26 19* 19*  GLUCOSE 188* 120* 76 174* 239*  BUN 84* 105* 112* 111* 111*  CREATININE 3.90* 3.38* 3.09* 2.67* 2.39*  CALCIUM 8.2* 9.1 9.1 8.8* 9.0  MG 2.3 2.4 2.6* 2.5* 2.5*  PHOS 5.8* 5.5* 4.9* 4.7* 4.8*    Liver Function Tests: Recent Labs  Lab 05/02/24 2157  AST 42*  ALT 25  ALKPHOS 64  BILITOT 1.3*  PROT 6.4*  ALBUMIN 3.3*   No results for input(s): "LIPASE", "AMYLASE" in the last 168  hours. No results for input(s): "AMMONIA" in the last 168 hours.  CBC: Recent Labs  Lab 05/02/24 2157 05/03/24 0628 05/07/24 0525 05/07/24 1501 05/08/24 0115 05/08/24 0647 05/09/24 0431  WBC 1.5*   < > 16.2* 17.9* 19.6* 22.4* 20.7*  NEUTROABS 0.9*  --   --   --   --  16.6*  --   HGB 10.3*   < > 9.6* 10.0* 9.8* 9.8* 9.4*  HCT 33.8*   < > 28.8* 30.1* 29.0* 29.9* 28.2*  MCV 101.5*   < > 91.4 92.0 91.2 94.0 91.3  PLT 112*   < > 30* 30* 37* 43* 63*   < > = values in this interval not displayed.    Cardiac Enzymes: No results for input(s): "CKTOTAL", "CKMB", "CKMBINDEX", "TROPONINI" in the last 168 hours.  BNP: Invalid input(s): "POCBNP"  CBG: Recent Labs  Lab 05/08/24 2004 05/08/24 2340 05/09/24 0404 05/09/24 0827 05/09/24 1145  GLUCAP 267* 192* 216* 231* 259*    Microbiology: Results for orders placed or performed during the hospital encounter of 05/02/24  Culture, blood (Routine x 2)     Status: Abnormal   Collection Time: 05/02/24  9:57 PM   Specimen: BLOOD  Result Value Ref Range Status   Specimen Description   Final    BLOOD BLOOD RIGHT ARM Performed at [facility], [address] [place],  [place] [address]    Special Requests   Final    BOTTLES DRAWN AEROBIC AND ANAEROBIC Blood Culture adequate volume Performed at [facility], [address] [place], [place] [address]    Culture  Setup Time   Final    GRAM NEGATIVE RODS IN BOTH AEROBIC AND ANAEROBIC BOTTLES CRITICAL VALUE NOTED.  VALUE IS CONSISTENT WITH PREVIOUSLY REPORTED AND CALLED VALUE. Performed at [facility], [address] [place], [place] [address]    Culture (A)  Final    ESCHERICHIA COLI SUSCEPTIBILITIES PERFORMED ON PREVIOUS CULTURE WITHIN THE LAST 5 DAYS. Performed at [facility], [address], [place], [place] [address]    Report Status 05/05/2024 FINAL  Final  Culture, blood (Routine x 2)     Status: Abnormal   Collection Time: 05/02/24   9:58 PM   Specimen: BLOOD  Result Value Ref Range Status   Specimen Description   Final    BLOOD BLOOD RIGHT ARM Performed at [facility], [address] [place], [place] [address]    Special Requests   Final    BOTTLES DRAWN AEROBIC AND ANAEROBIC Blood Culture adequate volume Performed at [facility], [address] [place], [place] [address]    Culture  Setup Time   Final    GRAM NEGATIVE RODS IN BOTH AEROBIC AND ANAEROBIC BOTTLES CRITICAL RESULT CALLED TO, READ BACK BY AND VERIFIED WITH: SHEEMA HALLAJI PHARMD 1020 05/03/24 HNM    Culture ESCHERICHIA COLI (A)  Final   Report Status 05/05/2024 FINAL  Final   Organism ID, Bacteria ESCHERICHIA COLI  Final   Organism ID, Bacteria ESCHERICHIA COLI  Final      Susceptibility   Escherichia coli - KIRBY BAUER*    CEFAZOLIN SENSITIVE Sensitive    Escherichia coli - MIC*    AMPICILLIN 4 SENSITIVE Sensitive     CEFEPIME <=0.12 SENSITIVE Sensitive     CEFTAZIDIME <=1 SENSITIVE Sensitive     CEFTRIAXONE <=0.25 SENSITIVE Sensitive     CIPROFLOXACIN <=0.25 SENSITIVE Sensitive     GENTAMICIN <=1 SENSITIVE Sensitive     IMIPENEM <=0.25 SENSITIVE Sensitive     TRIMETH/SULFA <=20 SENSITIVE Sensitive     AMPICILLIN/SULBACTAM <=2 SENSITIVE Sensitive     PIP/TAZO <=4 SENSITIVE Sensitive ug/mL    * ESCHERICHIA COLI    ESCHERICHIA COLI  Blood Culture ID Panel (Reflexed)     Status: Abnormal   Collection Time: 05/02/24  9:58 PM  Result Value Ref Range Status   Enterococcus faecalis NOT DETECTED NOT DETECTED Final   Enterococcus Faecium NOT DETECTED NOT DETECTED Final   Listeria monocytogenes NOT DETECTED NOT DETECTED Final   Staphylococcus species NOT DETECTED NOT DETECTED Final   Staphylococcus aureus (BCID) NOT DETECTED NOT DETECTED Final   Staphylococcus epidermidis NOT DETECTED NOT DETECTED Final   Staphylococcus lugdunensis NOT DETECTED NOT DETECTED Final   Streptococcus species NOT DETECTED NOT DETECTED Final    Streptococcus agalactiae NOT DETECTED NOT DETECTED Final   Streptococcus pneumoniae NOT DETECTED NOT DETECTED Final   Streptococcus pyogenes NOT DETECTED NOT DETECTED Final   A.calcoaceticus-baumannii NOT DETECTED NOT DETECTED Final   Bacteroides fragilis NOT DETECTED NOT DETECTED Final   Enterobacterales DETECTED (A) NOT DETECTED Final    Comment: Enterobacterales represent a large order of gram negative bacteria, not a single organism. CRITICAL RESULT CALLED TO, READ BACK BY AND VERIFIED WITH: SHEEMA HALLAJI PHARMD 1020 05/03/24 HNM    Enterobacter cloacae complex NOT DETECTED NOT DETECTED Final  Escherichia coli DETECTED (A) NOT DETECTED Final    Comment: CRITICAL RESULT CALLED TO, READ BACK BY AND VERIFIED WITH: SHEEMA HALLAJI PHARMD 1020 05/03/24 HNM    Klebsiella aerogenes NOT DETECTED NOT DETECTED Final   Klebsiella oxytoca NOT DETECTED NOT DETECTED Final   Klebsiella pneumoniae NOT DETECTED NOT DETECTED Final   Proteus species NOT DETECTED NOT DETECTED Final   Salmonella species NOT DETECTED NOT DETECTED Final   Serratia marcescens NOT DETECTED NOT DETECTED Final   Haemophilus influenzae NOT DETECTED NOT DETECTED Final   Neisseria meningitidis NOT DETECTED NOT DETECTED Final   Pseudomonas aeruginosa NOT DETECTED NOT DETECTED Final   Stenotrophomonas maltophilia NOT DETECTED NOT DETECTED Final   Candida albicans NOT DETECTED NOT DETECTED Final   Candida auris NOT DETECTED NOT DETECTED Final   Candida glabrata NOT DETECTED NOT DETECTED Final   Candida krusei NOT DETECTED NOT DETECTED Final   Candida parapsilosis NOT DETECTED NOT DETECTED Final   Candida tropicalis NOT DETECTED NOT DETECTED Final   Cryptococcus neoformans/gattii NOT DETECTED NOT DETECTED Final   CTX-M ESBL NOT DETECTED NOT DETECTED Final   Carbapenem resistance IMP NOT DETECTED NOT DETECTED Final   Carbapenem resistance KPC NOT DETECTED NOT DETECTED Final   Carbapenem resistance NDM NOT DETECTED NOT DETECTED  Final   Carbapenem resist OXA 48 LIKE NOT DETECTED NOT DETECTED Final   Carbapenem resistance VIM NOT DETECTED NOT DETECTED Final    Comment: Performed at [facility], [address] [place], [place] [address]  Resp panel by RT-PCR (RSV, Flu A&B, Covid) Anterior Nasal Swab     Status: None   Collection Time: 05/02/24 11:17 PM   Specimen: Anterior Nasal Swab  Result Value Ref Range Status   SARS Coronavirus 2 by RT PCR NEGATIVE NEGATIVE Final    Comment: (NOTE) SARS-CoV-2 target nucleic acids are NOT DETECTED.  The SARS-CoV-2 RNA is generally detectable in upper respiratory specimens during the acute phase of infection. The lowest concentration of SARS-CoV-2 viral copies this assay can detect is 138 copies/mL. A negative result does not preclude SARS-Cov-2 infection and should not be used as the sole basis for treatment or other patient management decisions. A negative result may occur with  improper specimen collection/handling, submission of specimen other than nasopharyngeal swab, presence of viral mutation(s) within the areas targeted by this assay, and inadequate number of viral copies(<138 copies/mL). A negative result must be combined with clinical observations, patient history, and epidemiological information. The expected result is Negative.  Fact Sheet for Patients:  BloggerCourse.com  Fact Sheet for Healthcare Providers:  SeriousBroker.it  This test is no t yet approved or cleared by the [place]  FDA and  has been authorized for detection and/or diagnosis of SARS-CoV-2 by FDA under an Emergency Use Authorization (EUA). This EUA will remain  in effect (meaning this test can be used) for the duration of the COVID-19 declaration under Section 564(b)(1) of the Act, 21 U.S.C.section 360bbb-3(b)(1), unless the authorization is terminated  or revoked sooner.       Influenza A by PCR NEGATIVE NEGATIVE Final    Influenza B by PCR NEGATIVE NEGATIVE Final    Comment: (NOTE) The Xpert Xpress SARS-CoV-2/FLU/RSV plus assay is intended as an aid in the diagnosis of influenza from Nasopharyngeal swab specimens and should not be used as a sole basis for treatment. Nasal washings and aspirates are unacceptable for Xpert Xpress SARS-CoV-2/FLU/RSV testing.  Fact Sheet for Patients: BloggerCourse.com  Fact Sheet for Healthcare Providers: SeriousBroker.it  This  test is not yet approved or cleared by the [place]  FDA and has been authorized for detection and/or diagnosis of SARS-CoV-2 by FDA under an Emergency Use Authorization (EUA). This EUA will remain in effect (meaning this test can be used) for the duration of the COVID-19 declaration under Section 564(b)(1) of the Act, 21 U.S.C. section 360bbb-3(b)(1), unless the authorization is terminated or revoked.     Resp Syncytial Virus by PCR NEGATIVE NEGATIVE Final    Comment: (NOTE) Fact Sheet for Patients: BloggerCourse.com  Fact Sheet for Healthcare Providers: SeriousBroker.it  This test is not yet approved or cleared by the [place]  FDA and has been authorized for detection and/or diagnosis of SARS-CoV-2 by FDA under an Emergency Use Authorization (EUA). This EUA will remain in effect (meaning this test can be used) for the duration of the COVID-19 declaration under Section 564(b)(1) of the Act, 21 U.S.C. section 360bbb-3(b)(1), unless the authorization is terminated or revoked.  Performed at [facility], [address] [place], [place] [address]   Urine Culture     Status: Abnormal   Collection Time: 05/03/24 12:18 AM   Specimen: Urine, Random  Result Value Ref Range Status   Specimen Description   Final    URINE, RANDOM Performed at [facility], [address] [place], [place] [address]    Special  Requests   Final    NONE Reflexed from [phone] Performed at [facility], [address] [place], [place] [address]    Culture >=100,000 COLONIES/mL ESCHERICHIA COLI (A)  Final   Report Status 05/05/2024 FINAL  Final   Organism ID, Bacteria ESCHERICHIA COLI (A)  Final      Susceptibility   Escherichia coli - MIC*    AMPICILLIN 4 SENSITIVE Sensitive     CEFAZOLIN <=4 SENSITIVE Sensitive     CEFEPIME <=0.12 SENSITIVE Sensitive     CEFTRIAXONE <=0.25 SENSITIVE Sensitive     CIPROFLOXACIN <=0.25 SENSITIVE Sensitive     GENTAMICIN <=1 SENSITIVE Sensitive     IMIPENEM <=0.25 SENSITIVE Sensitive     NITROFURANTOIN <=16 SENSITIVE Sensitive     TRIMETH/SULFA <=20 SENSITIVE Sensitive     AMPICILLIN/SULBACTAM <=2 SENSITIVE Sensitive     PIP/TAZO <=4 SENSITIVE Sensitive ug/mL    * >=100,000 COLONIES/mL ESCHERICHIA COLI  MRSA Next Gen by PCR, Nasal     Status: Abnormal   Collection Time: 05/03/24  4:00 AM   Specimen: Nasal Mucosa; Nasal Swab  Result Value Ref Range Status   MRSA by PCR Next Gen DETECTED (A) NOT DETECTED Final    Comment: RESULT CALLED TO, READ BACK BY AND VERIFIED WITH: Salina Craver RN [phone] 05/03/24 HNM (NOTE) The GeneXpert MRSA Assay (FDA approved for NASAL specimens only), is one component of a comprehensive MRSA colonization surveillance program. It is not intended to diagnose MRSA infection nor to guide or monitor treatment for MRSA infections. Test performance is not FDA approved in patients less than 85 years old. Performed at [facility], [address] [place], [place] [address]   Culture, blood (Routine X 2) w Reflex to ID Panel     Status: None (Preliminary result)   Collection Time: 05/05/24 12:17 AM   Specimen: BLOOD RIGHT ARM  Result Value Ref Range Status   Specimen Description BLOOD RIGHT ARM  Final   Special Requests   Final    BOTTLES DRAWN AEROBIC AND ANAEROBIC Blood Culture adequate volume   Culture   Final    NO GROWTH 4  DAYS Performed at [facility], [address] [place], [place] [address]    Report Status PENDING  Incomplete  Culture, blood (Routine X 2) w Reflex to ID Panel     Status: None (Preliminary result)   Collection Time: 05/05/24 12:17 AM   Specimen: BLOOD LEFT HAND  Result Value Ref Range Status   Specimen Description BLOOD LEFT HAND  Final   Special Requests   Final    BOTTLES DRAWN AEROBIC AND ANAEROBIC Blood Culture adequate volume   Culture   Final    NO GROWTH 4 DAYS Performed at [facility], [address] [place], [place] [address]    Report Status PENDING  Incomplete  Culture, blood (Routine X 2) w Reflex to ID Panel     Status: None (Preliminary result)   Collection Time: 05/08/24  6:47 AM   Specimen: BLOOD  Result Value Ref Range Status   Specimen Description BLOOD BLOOD LEFT HAND  Final   Special Requests   Final    BOTTLES DRAWN AEROBIC AND ANAEROBIC Blood Culture adequate volume   Culture   Final    NO GROWTH 1 DAY Performed at [facility], [address] [place], [place] [address]    Report Status PENDING  Incomplete  Culture, blood (Routine X 2) w Reflex to ID Panel     Status: None (Preliminary result)   Collection Time: 05/08/24  6:47 AM   Specimen: BLOOD  Result Value Ref Range Status   Specimen Description BLOOD BLOOD LEFT ARM  Final   Special Requests   Final    BOTTLES DRAWN AEROBIC AND ANAEROBIC Blood Culture adequate volume   Culture   Final    NO GROWTH 1 DAY Performed at [facility], [address] [place], [place] [address]    Report Status PENDING  Incomplete    Coagulation Studies: Recent Labs    05/08/24 0947  LABPROT 31.9*  INR 3.1*    Urinalysis: No results for input(s): "COLORURINE", "LABSPEC", "PHURINE", "GLUCOSEU", "HGBUR", "BILIRUBINUR", "KETONESUR", "PROTEINUR", "UROBILINOGEN", "NITRITE", "LEUKOCYTESUR" in the last 72 hours.  Invalid input(s): "APPERANCEUR"    Imaging: DG Chest  Port 1 View Result Date: 05/08/2024 CLINICAL DATA:  Sepsis. EXAM: PORTABLE CHEST 1 VIEW COMPARISON:  05/04/2024 FINDINGS: The cardiopericardial silhouette is within normal limits for size. Interval improvement in aeration the right base with decreasing right pleural effusion. Left lung clear. No acute bony abnormality. Telemetry leads overlie the chest. IMPRESSION: Interval improvement in aeration at the right base with decreasing right pleural effusion. Electronically Signed   By: Donnal Fusi M.D.   On: 05/08/2024 09:17     Medications:    cefTRIAXone (ROCEPHIN)  IV Stopped (05/08/24 1221)    brimonidine  1 drop Right Eye TID   brinzolamide  1 drop Right Eye TID   Chlorhexidine Gluconate Cloth  6 each Topical Daily   insulin  aspart  0-9 Units Subcutaneous Q4H   insulin  aspart  3 Units Subcutaneous TID WC   insulin  glargine-yfgn  8 Units Subcutaneous Daily   metoprolol tartrate  25 mg Oral BID   mycophenolate  360 mg Oral BID   pantoprazole  40 mg Oral BID   sodium bicarbonate  100 mEq Intravenous Once   [START ON 05/10/2024] sodium bicarbonate  650 mg Oral TID   tacrolimus   1 mg Oral Q1200   tacrolimus   2 mg Oral Q1200   tamsulosin  0.4 mg Oral QPC supper   acetaminophen, docusate sodium, fentaNYL (  SUBLIMAZE) injection, ondansetron (ZOFRAN) IV, mouth rinse, polyethylene glycol, traZODone  Assessment/ Plan:  Mr. Rick Wang is a 68 y.o.  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  Patient currently follows with [facility] nephrology, Dr. Helga Loan.  Baseline creatinine  1.4-1.6.  Creatinine peaked at 3.9. Acute kidney injury likely secondary to concurrent illness, septic shock and hypovolemia.   - Continue mycophenolate. Will increase to full dose of Tacrolimus , Tacrolimus  2mg  in am, 1mg  in evening. - acute kidney injury complicated by acute metabolic acidosis: continue sodium bicarbonate -  Continue to wean hydrocortisone   Lab Results  Component Value Date   CREATININE 2.39 (H) 05/09/2024   CREATININE 2.67 (H) 05/08/2024   CREATININE 3.09 (H) 05/07/2024        Intake/Output Summary (Last 24 hours) at 05/09/2024 1158 Last data filed at 05/09/2024 1021 Gross per 24 hour  Intake 440.75 ml  Output 900 ml  Net -459.25 ml      2.  Sepsis secondary to E. coli bacteremia UTI.  Pansensitive  - indwelling foley catheter placed  - Continue Ceftriaxone  - Appreciate ID input   3. Anemia of chronic kidney disease Hemoglobin & Hematocrit     Component Value Date/Time   HGB 9.4 (L) 05/09/2024 0431   HGB 9.9 (L) 04/03/2015 0836   HCT 28.2 (L) 05/09/2024 0431   HCT 30.6 (L) 04/03/2015 0836   Hemoglobin within desired range.   4. Secondary Hyperparathyroidism:     Recent Labs           Lab Results  Component Value Date    CALCIUM 9.1 (L) 05/07/2024    PHOS 4.9 (H) 05/07/2024      Will continue to monitor bone minerals at this time.   LOS: 6 Victoria Henshaw 5/27/202511:58 AM

## 2024-05-09 NOTE — Progress Notes (Signed)
 Mobility Specialist - Progress Note  During mobility: SpO2 91% Post-mobility: SPO2 93%   05/09/24 0955  Mobility  Activity Ambulated with assistance in hallway  Level of Assistance Contact guard assist, steadying assist  Assistive Device Front wheel walker  Distance Ambulated (ft) 320 ft  Activity Response Tolerated well  Mobility visit 1 Mobility  Mobility Specialist Start Time (ACUTE ONLY) [id number]  Mobility Specialist Stop Time (ACUTE ONLY) 0948  Mobility Specialist Time Calculation (min) (ACUTE ONLY) 21 min   Pt supine upon entry, utilizing RA. Pt motivated and agreeable to OOB amb this date. Pt expressed feeling "fine" today. Pt completed bed mob ModI, STS to RW and amb two laps around the NS CGA-MinG--- endorsed min dizziness upon standing, subsiding during amb. Pt returned to the room, left seated in the recliner with alarm set and needs within reach.  Versa Gore Mobility Specialist 05/09/24 10:19 AM

## 2024-05-09 NOTE — Progress Notes (Addendum)
 Date of Admission:  05/02/2024    ID: Rick Wang is a 68 y.o. male Principal Problem:   Severe sepsis (HCC) Active Problems:   E coli bacteremia   AKI (acute kidney injury) (HCC)   Complicated UTI (urinary tract infection)  Wife at bed side  Subjective: Pt is doing better Using spirometry  Medications:   [START ON 05/10/2024] amoxicillin   1,000 mg Oral Q8H   brimonidine  1 drop Right Eye TID   brinzolamide  1 drop Right Eye TID   Chlorhexidine Gluconate Cloth  6 each Topical Daily   insulin  aspart  0-9 Units Subcutaneous Q4H   insulin  aspart  3 Units Subcutaneous TID WC   insulin  glargine-yfgn  8 Units Subcutaneous Daily   metoprolol tartrate  25 mg Oral BID   mycophenolate  360 mg Oral BID   pantoprazole  40 mg Oral BID   [START ON 05/10/2024] sodium bicarbonate  650 mg Oral TID   tacrolimus   1 mg Oral Q1200   tacrolimus   2 mg Oral Q1200   tamsulosin  0.4 mg Oral QPC supper    Objective: Vital signs in last 24 hours: Patient Vitals for the past 24 hrs:  BP Temp Temp src Pulse Resp SpO2 Weight  05/09/24 1617 (!) 121/57 98 F (36.7 C) Oral 71 17 94 % --  05/09/24 1115 -- -- -- 66 -- 97 % --  05/09/24 0829 134/68 98 F (36.7 C) Oral 84 18 96 % --  05/09/24 0428 -- -- -- -- -- -- 79 kg  05/09/24 0406 (!) 140/70 97.9 F (36.6 C) -- 80 -- 94 % --  05/08/24 1933 (!) 102/56 97.8 F (36.6 C) Oral 74 18 93 % --       PHYSICAL EXAM:  General: Alert, cooperative,  Lungs: b/l air entry- Heart:s1s2 Abdomen: Soft, non-tender,not distended. Bowel sounds normal. No masses Extremities: atraumatic, no cyanosis. No edema. No clubbing Skin: No rashes or lesions. Or bruising Lymph: Cervical, supraclavicular normal. Neurologic: Grossly non-focal foley  Lab Results    Latest Ref Rng & Units 05/09/2024    4:31 AM 05/08/2024    6:47 AM 05/08/2024    1:15 AM  CBC  WBC 4.0 - 10.5 K/uL 20.7  22.4  19.6   Hemoglobin 13.0 - 17.0 g/dL 9.4  9.8  9.8   Hematocrit 39.0 - 52.0 %  28.2  29.9  29.0   Platelets 150 - 400 K/uL 63  43  37        Latest Ref Rng & Units 05/09/2024    4:31 AM 05/08/2024    1:15 AM 05/07/2024    5:25 AM  CMP  Glucose 70 - 99 mg/dL 784  696  76   BUN 8 - 23 mg/dL 295  284  132   Creatinine 0.61 - 1.24 mg/dL 4.40  1.02  7.25   Sodium 135 - 145 mmol/L 136  133  140   Potassium 3.5 - 5.1 mmol/L 3.9  3.7  3.7   Chloride 98 - 111 mmol/L 103  103  105   CO2 22 - 32 mmol/L 19  19  26    Calcium 8.9 - 10.3 mg/dL 9.0  8.8  9.1       Microbiology: 5/20 both sets blood culture positive for Ecoli UC 5/21- Ecoli Studies/Results: DG Chest Port 1 View Result Date: 05/08/2024 CLINICAL DATA:  Sepsis. EXAM: PORTABLE CHEST 1 VIEW COMPARISON:  05/04/2024 FINDINGS: The cardiopericardial silhouette is within normal limits  for size. Interval improvement in aeration the right base with decreasing right pleural effusion. Left lung clear. No acute bony abnormality. Telemetry leads overlie the chest. IMPRESSION: Interval improvement in aeration at the right base with decreasing right pleural effusion. Electronically Signed   By: Donnal Fusi M.D.   On: 05/08/2024 09:17     Assessment/Plan: Ecoli bacteremia with ecoli UTI- on ceftriaxone- pan sensitive organism Leucocytosis persist , though cr has improved, procal has improved a lot CT was done and it does not show any pathology in the transplant kidney Repeat Blood culture sent Will change ceftriaxone to PO amoxicliin On discharge can change to Po amoxicllin adjusted to crcl for a total of 14 days- until 05/16/24- if leucocytosis persist after a week then may have to extend antibiotics and also repeat imaging  B/l pleural effusion left > rt  Worsening renal function- Nephrology on board Now improved Pt has foley which wa s[placed this admission Will discuss with hospitalist regarding removal  Renal transplant Off  steroids- restarted immunesuppressive therapy  Thrombocytopenia   Type I DM on  insulin   Discussed the management with patient and his wife  at bed side  Discussed with pharmacists

## 2024-05-09 NOTE — Plan of Care (Signed)

## 2024-05-10 DIAGNOSIS — N390 Urinary tract infection, site not specified: Secondary | ICD-10-CM | POA: Diagnosis not present

## 2024-05-10 DIAGNOSIS — R7881 Bacteremia: Secondary | ICD-10-CM | POA: Diagnosis not present

## 2024-05-10 DIAGNOSIS — A419 Sepsis, unspecified organism: Secondary | ICD-10-CM | POA: Diagnosis not present

## 2024-05-10 DIAGNOSIS — R6520 Severe sepsis without septic shock: Secondary | ICD-10-CM | POA: Diagnosis not present

## 2024-05-10 DIAGNOSIS — B9620 Unspecified Escherichia coli [E. coli] as the cause of diseases classified elsewhere: Secondary | ICD-10-CM | POA: Diagnosis not present

## 2024-05-10 DIAGNOSIS — D72829 Elevated white blood cell count, unspecified: Secondary | ICD-10-CM | POA: Diagnosis not present

## 2024-05-10 LAB — CBC
HCT: 25.7 % — ABNORMAL LOW (ref 39.0–52.0)
Hemoglobin: 8.3 g/dL — ABNORMAL LOW (ref 13.0–17.0)
MCH: 30.2 pg (ref 26.0–34.0)
MCHC: 32.3 g/dL (ref 30.0–36.0)
MCV: 93.5 fL (ref 80.0–100.0)
Platelets: 94 10*3/uL — ABNORMAL LOW (ref 150–400)
RBC: 2.75 MIL/uL — ABNORMAL LOW (ref 4.22–5.81)
RDW: 14.4 % (ref 11.5–15.5)
WBC: 15.4 10*3/uL — ABNORMAL HIGH (ref 4.0–10.5)
nRBC: 0 % (ref 0.0–0.2)

## 2024-05-10 LAB — CULTURE, BLOOD (ROUTINE X 2)
Culture: NO GROWTH
Culture: NO GROWTH
Special Requests: ADEQUATE
Special Requests: ADEQUATE

## 2024-05-10 LAB — BASIC METABOLIC PANEL WITH GFR
Anion gap: 11 (ref 5–15)
BUN: 101 mg/dL — ABNORMAL HIGH (ref 8–23)
CO2: 23 mmol/L (ref 22–32)
Calcium: 8.8 mg/dL — ABNORMAL LOW (ref 8.9–10.3)
Chloride: 104 mmol/L (ref 98–111)
Creatinine, Ser: 2.26 mg/dL — ABNORMAL HIGH (ref 0.61–1.24)
GFR, Estimated: 31 mL/min — ABNORMAL LOW (ref >60)
Glucose, Bld: 143 mg/dL — ABNORMAL HIGH (ref 70–99)
Potassium: 3.3 mmol/L — ABNORMAL LOW (ref 3.5–5.1)
Sodium: 138 mmol/L (ref 135–145)

## 2024-05-10 LAB — GLUCOSE, CAPILLARY
Glucose-Capillary: 140 mg/dL — ABNORMAL HIGH (ref 70–99)
Glucose-Capillary: 227 mg/dL — ABNORMAL HIGH (ref 70–99)
Glucose-Capillary: 227 mg/dL — ABNORMAL HIGH (ref 70–99)
Glucose-Capillary: 209 mg/dL — ABNORMAL HIGH (ref 70–99)
Glucose-Capillary: 314 mg/dL — ABNORMAL HIGH (ref 70–99)

## 2024-05-10 LAB — PHOSPHORUS: Phosphorus: 4.4 mg/dL (ref 2.5–4.6)

## 2024-05-10 LAB — MAGNESIUM: Magnesium: 2.5 mg/dL — ABNORMAL HIGH (ref 1.7–2.4)

## 2024-05-10 MED ORDER — GUAIFENESIN-DM 100-10 MG/5ML PO SYRP: 10.0000 mL | ORAL_SOLUTION | Freq: Four times a day (QID) | ORAL | Status: DC | PRN

## 2024-05-10 MED ORDER — POTASSIUM CHLORIDE CRYS ER 20 MEQ PO TBCR
40.0000 meq | EXTENDED_RELEASE_TABLET | Freq: Once | ORAL | Status: AC
  Administered 2024-05-10: 40 meq via ORAL
  Filled 2024-05-10: qty 2

## 2024-05-10 MED ORDER — INSULIN GLARGINE-YFGN 100 UNIT/ML ~~LOC~~ SOLN
10.0000 [IU] | Freq: Every day | SUBCUTANEOUS | Status: DC
  Administered 2024-05-10 – 2024-05-11 (×2): 10 [IU] via SUBCUTANEOUS
  Filled 2024-05-10 (×2): qty 0.1

## 2024-05-10 MED ORDER — INSULIN ASPART 100 UNIT/ML IJ SOLN
4.0000 [IU] | Freq: Three times a day (TID) | INTRAMUSCULAR | Status: DC
  Administered 2024-05-10 – 2024-05-11 (×4): 4 [IU] via SUBCUTANEOUS
  Filled 2024-05-10 (×4): qty 1

## 2024-05-10 MED ORDER — DIPHENHYDRAMINE HCL 25 MG PO CAPS
25.0000 mg | ORAL_CAPSULE | Freq: Three times a day (TID) | ORAL | Status: DC | PRN
  Administered 2024-05-10: 25 mg via ORAL
  Filled 2024-05-10: qty 1

## 2024-05-10 NOTE — Progress Notes (Signed)
 Physical Therapy Treatment Patient Details Name: Rick Wang MRN: [id number] DOB: 1956/08/10 Today's Date: 05/10/2024   History of Present Illness Pt is a 68 y/o male presented to ED on 05/02/24 after being found on floor by EMS. Admitted for septic shock due to UTI. PMH: T1DM, hx of kidney transplant, CKD, DVT, HTN, esophagitis with dysphagia.    PT Comments  Pt was finishing up in BR with RN tech upon arrival. He is A and O x 4. Agrees to session and remains cooperative and pleasant throughout. He demonstrates safe ambulation with use of AD/RW. Does still have some unsteadiness with ambulation without AD. Encouraged pt to use RW until post acute PTs tells him he is safe to ambulate without. OPPT remains most appropriate post acute PT at DC.     If plan is discharge home, recommend the following: A little help with walking and/or transfers;A little help with bathing/dressing/bathroom;Assistance with cooking/housework;Direct supervision/assist for medications management;Direct supervision/assist for financial management;Assist for transportation;Help with stairs or ramp for entrance;Supervision due to cognitive status     Equipment Recommendations  Rolling walker (2 wheels)       Precautions / Restrictions Precautions Precautions: Fall Restrictions Weight Bearing Restrictions Per Provider Order: No     Mobility  Bed Mobility Overal bed mobility: Modified Independent   Transfers Overall transfer level: Needs assistance Equipment used: Rolling walker (2 wheels), None Transfers: Sit to/from Stand Sit to Stand: Supervision, Contact guard assist  General transfer comment: Supervision with use of AD. CGA without    Ambulation/Gait Ambulation/Gait assistance: Contact guard assist, Supervision Gait Distance (Feet): 400 Feet Assistive device: Rolling walker (2 wheels), None Gait Pattern/deviations: Step-through pattern, Decreased stride length, Trunk flexed Gait velocity: decreased   General Gait Details: Pt easily able to ambulate with RW with supervision however without UE support, CGA for safety with occasional staggering. No actual intervention required. encouraged pt to continue to use AD until balance and safety return to baseline.    Balance Overall balance assessment: Needs assistance Sitting-balance support: Bilateral upper extremity supported, Feet supported Sitting balance-Leahy Scale: Normal     Standing balance support: During functional activity, No upper extremity supported Standing balance-Leahy Scale: Good      Communication Communication Communication: No apparent difficulties  Cognition Arousal: Alert Behavior During Therapy: WFL for tasks assessed/performed   PT - Cognitive impairments: No apparent impairments    Following commands: Intact      Cueing Cueing Techniques: Verbal cues         Pertinent Vitals/Pain Pain Assessment Pain Assessment: No/denies pain Pain Score: 0-No pain Pain Intervention(s): Monitored during session, Limited activity within patient's tolerance     PT Goals (current goals can now be found in the care plan section) Acute Rehab PT Goals Patient Stated Goal: Go home when Im safe too Progress towards PT goals: Progressing toward goals    Frequency    Min 2X/week       AM-PAC PT "6 Clicks" Mobility   Outcome Measure  Help needed turning from your back to your side while in a flat bed without using bedrails?: A Little Help needed moving from lying on your back to sitting on the side of a flat bed without using bedrails?: A Little Help needed moving to and from a bed to a chair (including a wheelchair)?: A Little Help needed standing up from a chair using your arms (e.g., wheelchair or bedside chair)?: A Little Help needed to walk in hospital room?: A Little Help needed  climbing 3-5 steps with a railing? : A Little 6 Click Score: 18    End of Session   Activity Tolerance: Patient tolerated  treatment well Patient left: in chair;with chair alarm set;with call bell/phone within reach;with family/visitor present Nurse Communication: Mobility status PT Visit Diagnosis: Unsteadiness on feet (R26.81);Muscle weakness (generalized) (M62.81);Difficulty in walking, not elsewhere classified (R26.2)     Time: 1610-9604 PT Time Calculation (min) (ACUTE ONLY): 10 min  Charges:    $Gait Training: 8-22 mins PT General Charges $$ ACUTE PT VISIT: 1 Visit                     Chester Costa PTA 05/10/24, 11:37 AM

## 2024-05-10 NOTE — Inpatient Diabetes Management (Signed)
 Inpatient Diabetes Program Recommendations  AACE/ADA: New Consensus Statement on Inpatient Glycemic Control (2015)  Target Ranges:  Prepandial:   less than 140 mg/dL      Peak postprandial:   less than 180 mg/dL (1-2 hours)      Critically ill patients:  140 - 180 mg/dL   Lab Results  Component Value Date   GLUCAP 227 (H) 05/10/2024   HGBA1C 5.5 05/04/2024    Latest Reference Range & Units 05/09/24 08:27 05/09/24 11:45 05/09/24 16:20 05/09/24 19:40 05/09/24 23:26 05/10/24 03:23 05/10/24 07:50  Glucose-Capillary 70 - 99 mg/dL 161 (H) Novolog  6 units 259 (H) Novolog  8 units 326 (H) Novolog  12 units 211 (H) Novolog  3 units 146 (H) Novolog  1 unit 140 (H) Novolog  1 unit 227 (H) Novolog  6 units  (H): Data is abnormally high  History: Type 1 diabetes   Home DM Meds: Omni Pod Insulin  Pump   Current Orders: Semglee  8 units daily                           Novolog  Sensitive Correction Scale/ SSI (0-9 units) Q4 hours                           Novolog  3 units TID with meals                            CBG 227 this AM   Please consider:  1. Increase Semglee  slightly to 10 units daily   2. Increase Novolog  Meal Coverage to 4 units TID with meals  Thank you, Marily Shows E. Asianna Brundage, RN, MSN, CDCES  Diabetes Coordinator Inpatient Glycemic Control Team Team Pager [phone] (8am-5pm) 05/10/2024 8:37 AM

## 2024-05-10 NOTE — Progress Notes (Signed)
 [facility] Progress Note  Patient: Rick Wang    ZOX:[id number]  DOA: 05/02/2024     Date of Service: the patient was seen and examined on 05/10/2024  Chief Complaint  Patient presents with   Fever   Near Syncope   Brief hospital course: Rick Wang is 68 year old with h/o HTN, HLD, Diabetes type 1, Renal transplant 2012, CKD, DVT, Eosinophilic esophagitis with dysphagia that presented 5/20 to the ER with complaints of Fever and syncope. To review, patient received Hep B vaccine Monday, on Tuesday developed Malaise, poor appetite, fever, nausea, confusion. Wife states that he was standing and his legs gave out, she assisted him to the ground. EMS was called, upon arrival to the ER BP 113/92, HR 114, Temp 102.6. Labs revealed chronic Leukopenia 1.5, hgb 10.3, Plts 112, BUN 43, creat 2.07, Lactic acid 5.2, PT 17, INR 1.4, Beta hydroxybutyrate negative, RVP negative. UA+, culture pending, BC x 2, and he was started on Cefepime and Vancomycin. He was given 30 ml/kg IVF bolus. With ongoing hypotension he was given and additional 1 liter (3 Liters total) and started on Levophed infusion. He is now admitted to the ICU for further management of his septic shock.   5/21: Admitted to ICU with septic shock due to UTI. BC x 2, RVP negative, Urine culture pending, Cefepime + Vancomycin. S/p 3 Liters IVF. Starting insulin  drip and discontinuing patients pump.  05/04/24-patient had insomnia overnight, on 2L/min Rick Wang.  S/p ID eval.  Remains oliguric and has been seen by nephrology. Has dysphagia and is for SLP.  Remains off vasopressors.  Transferred to [facility] on 5/23  Assessment and Plan:  # Septic Shock secondary to UTI # Immunocompromised, chronic # Lactic Acidosis # Hypotension - s/p  Cefepime + Vancomycin - s/p 3 liters IVFB, and  IVF @ 150 ml/hr, s/p Levophed infusion - due to Persistent hypotension stress dose steroids started in the ICU, s/p finished tapering dose 5/21 Urine culture growing E.  coli pansensitive 5/20 blood culture growing E. Coli, pansensitive 5/21 s/p ceftriaxone 2 g IV daily 5/23 repeat blood culture NGTD 5/26 repeat blood cultures NGTD  CT a/p: No acute inflammatory process identified within the abdomen or pelvis. ID consulted, started Augmentin  twice daily for total of 14 days, end date 6/3 WBC count trending down  # Thrombocytopenia, HIT screen negative T4 score 6, highly suspect.  Pharmacist consulted, s/p Argatroban IV gtt, d/c'd on 5/27 As per ID ruled out DIC. Fibrinogen level 353 within normal range, peripheral smear morphology normal, no schistocytes.  INR 3.1, PTT 31.9 elevated could be due to argatroban Platelet 37>>63>>94K stable Monitor CBC  # Acute hypoxic respiratory failure: Resolved  secondary to pulmonary edema and pleural effusion secondary to fluid resuscitation CT Chest: small left and small-to-moderate right pleural effusion W/ compressive atelectatic changes in the bilateral lower lobes. No lung mass, consolidation or pneumothorax. No suspicious lung nodule. 5/25 supplemental O2 admission has been weaned off, saturating well on room air 5/26 CXR: Pleural effusion almost resolved.  # Acute on Chronic Kidney Disease  # Renal Transplant 2012 @ [facility] reached out to Transplant team, and resumed tacrolimus  and mycophenolate.  - s/p Fluid resuscitation  - Avoid nephrotoxic agents - Renal US : Unremarkable right lower quadrant renal transplant.  5/23 Nephro restarted tacrolimus  at half dose 1 mg in a.m. and 0.5 mg in p.m. 5/24 restarted Myfortic. Spoke with Dr Helga Loan, who agrees with treatment plan.  5/27 resumed by Nephro: Tacrolimus  2  mg in the a.m. and 1 mg in the p.m. Cr 2.26 improving  # Metabolic acidosis secondary to AKI/CKD 5/24 CO2 18, bicarb 100 mEq IV one-time dose given followed by oral supplement. 5/27 bicarb 19, bicarbonate IV 100 mEq given.  Followed by oral supplement 5/28 bicarb 23, improved Monitor chemistry  daily   # Urinary retention, Foley catheter was inserted on 5/23, around 400 mL urine was collected immediately after Foley catheter insertion Started Flomax Keep Foley for few days and then DC before discharge    # Diabetes Type 1, utilizes insulin  pump Removed Insulin  pump and Endotool was started in the ICU Continue Semglee 8 units daily, started NovoLog  3 units 3 times daily Continue sliding scale monitor CBG Continue diabetic diet Diabetic coordinator consulted   # Hypertension # Hyperlipidemia Antihypertensive medications were held due to sepsis and hypotension Continue to hold statin for now 5/23 BP improved, patient is hypertensive Patient was on amlodipine 5 mg and lisinopril 40 mg at home Started Lopressor 25 mg p.o. twice daily with holding parameters    # Thrombocytopenia # Anemia Hb 9.6--10.1--8.3, dropped on 5/28 Platelets 112>>30>>37k - Monitor for bleeding - Daily CBC   # Eosinophilic Esophagitis w/ Dysphagia Patient is s/p recent EGD with biopsy's - Continue home PPI -SLP evaluation done, no oropharyngeal dysphagia.   Body mass index is 24.99 kg/m.  Interventions:  Diet: Dysphagia 3 diet with thin liquids DVT Prophylaxis: Subcutaneous Heparin    Advance goals of care discussion: Full code  Family Communication: family was present at bedside, at the time of interview.  The pt provided permission to discuss medical plan with the family. Opportunity was given to ask question and all questions were answered satisfactorily.   Disposition:  Pt is from home, admitted with sepsis due to UTI, still on IV Abx, which precludes a safe discharge. Discharge to Home, when stable most likely tomorrow a.m.  Subjective: No significant events overnight, patient was resting well in the bed.  Patient did work with physical therapy and able to walk around.  Patient was having some cough, advised to take Robitussin DM as needed. Patient denied any other  complaints.  Physical Exam: General: NAD, lying comfortably Appear in no distress, affect appropriate Eyes: PERRLA ENT: Oral Mucosa Clear, moist  Neck: no JVD,  Cardiovascular: S1 and S2 Present, no Murmur,  Respiratory: Equal air entry bilaterally, bibasilar crackles, no wheezes  Abdomen: Bowel Sound present, Soft and no tenderness,  Skin: no rashes Extremities: no Pedal edema, no calf tenderness Neurologic: without any new focal findings Gait not checked due to patient safety concerns  Vitals:   05/09/24 2144 05/10/24 0331 05/10/24 0749 05/10/24 1511  BP: (!) 126/55 128/60 (!) 135/55 136/62  Pulse: 75 70 73 71  Resp:   18 16  Temp:  98.3 F (36.8 C) 98 F (36.7 C) 98.4 F (36.9 C)  TempSrc:    Oral  SpO2:  97% 93% 95%  Weight:      Height:        Intake/Output Summary (Last 24 hours) at 05/10/2024 1627 Last data filed at 05/10/2024 1429 Gross per 24 hour  Intake 420 ml  Output 1700 ml  Net -1280 ml   Filed Weights   05/07/24 0500 05/08/24 0403 05/09/24 0428  Weight: 75.8 kg 74.8 kg 79 kg    Data Reviewed: I have personally reviewed and interpreted daily labs, tele strips, imagings as discussed above. I reviewed all nursing notes, pharmacy notes, vitals, pertinent old  records I have discussed plan of care as described above with RN and patient/family.  CBC: Recent Labs  Lab 05/07/24 1501 05/08/24 0115 05/08/24 0647 05/09/24 0431 05/10/24 0424  WBC 17.9* 19.6* 22.4* 20.7* 15.4*  NEUTROABS  --   --  16.6*  --   --   HGB 10.0* 9.8* 9.8* 9.4* 8.3*  HCT 30.1* 29.0* 29.9* 28.2* 25.7*  MCV 92.0 91.2 94.0 91.3 93.5  PLT 30* 37* 43* 63* 94*   Basic Metabolic Panel: Recent Labs  Lab 05/06/24 0452 05/07/24 0525 05/08/24 0115 05/09/24 0431 05/10/24 0424  NA 138 140 133* 136 138  K 4.3 3.7 3.7 3.9 3.3*  CL 107 105 103 103 104  CO2 18* 26 19* 19* 23  GLUCOSE 120* 76 174* 239* 143*  BUN 105* 112* 111* 111* 101*  CREATININE 3.38* 3.09* 2.67* 2.39* 2.26*   CALCIUM 9.1 9.1 8.8* 9.0 8.8*  MG 2.4 2.6* 2.5* 2.5* 2.5*  PHOS 5.5* 4.9* 4.7* 4.8* 4.4    Studies: No results found.    Scheduled Meds:  amoxicillin   1,000 mg Oral Q8H   brimonidine  1 drop Right Eye TID   brinzolamide  1 drop Right Eye TID   Chlorhexidine Gluconate Cloth  6 each Topical Daily   insulin  aspart  0-9 Units Subcutaneous Q4H   insulin  aspart  4 Units Subcutaneous TID WC   insulin  glargine-yfgn  10 Units Subcutaneous Daily   metoprolol tartrate  25 mg Oral BID   mycophenolate  360 mg Oral BID   pantoprazole  40 mg Oral BID   sodium bicarbonate  650 mg Oral TID   tacrolimus   1 mg Oral Q1200   tacrolimus   2 mg Oral Q1200   tamsulosin  0.4 mg Oral QPC supper   Continuous Infusions:   PRN Meds: acetaminophen, diphenhydrAMINE, docusate sodium, fentaNYL (SUBLIMAZE) injection, ondansetron (ZOFRAN) IV, mouth rinse, polyethylene glycol, traZODone  Time spent: 40 minutes  Author: Althia Atlas. MD [facility] 05/10/2024 4:27 PM  To reach On-call, see care teams to locate the attending and reach out to them via [email or web address]. If 7PM-7AM, please contact night-coverage If you still have difficulty reaching the attending provider, please page the [facility] (Director on Call) for [facility] on amion for assistance.

## 2024-05-10 NOTE — Progress Notes (Signed)
 Occupational Therapy Treatment Patient Details Name: Rick Wang MRN: [id number] DOB: December 11, 1956 Today's Date: 05/10/2024   History of present illness Pt is a 68 y/o male presented to ED on 05/02/24 after being found on floor by EMS. Admitted for septic shock due to UTI. PMH: T1DM, hx of kidney transplant, CKD, DVT, HTN, esophagitis with dysphagia.   OT comments  Pt is supine in bed on arrival. Pleasant and agreeable to OT session. He denies pain. Pt performed bed mobility MOD I, STS and mobility in the room with supervision and no LOB. He has slower pace and slightly wider BOS, but good safety awareness and occasionally reaches for bed rail etc. Standing oral care performed with supervision, able to lean over the sink to rinse mouth, etc. Ambulated 2 laps around nursing station without AD with supervision and no LOB before returning to bed with all needs in place. Discussed use of his wife's shower chair on return home to maximize safety, as well as discussed safety during meal prep and kitchen tasks with pt verbalizing understanding. Do not anticipate need for further OT services on DC, but will cont to follow acutely.      If plan is discharge home, recommend the following:  A little help with walking and/or transfers;Direct supervision/assist for medications management;Assist for transportation;Help with stairs or ramp for entrance   Equipment Recommendations  Tub/shower seat (reports he can use his wifes)    Recommendations for Other Services      Precautions / Restrictions Precautions Precautions: Fall Recall of Precautions/Restrictions: Intact Restrictions Weight Bearing Restrictions Per Provider Order: No       Mobility Bed Mobility Overal bed mobility: Modified Independent                  Transfers Overall transfer level: Needs assistance Equipment used: None Transfers: Sit to/from Stand Sit to Stand: Supervision           General transfer comment:  supervision for STS from EOB and mobility in the room as well as out in the hallway for 320 feet with no LOB     Balance Overall balance assessment: Needs assistance Sitting-balance support: Bilateral upper extremity supported, Feet supported Sitting balance-Leahy Scale: Normal     Standing balance support: During functional activity, No upper extremity supported Standing balance-Leahy Scale: Good Standing balance comment: no LOB for standing oral care; supervision for 320 feet without AD                           ADL either performed or assessed with clinical judgement   ADL Overall ADL's : Needs assistance/impaired     Grooming: Oral care;Wash/dry hands;Wash/dry face;Standing;Supervision/safety                                      Extremity/Trunk Assessment              Occupational psychologist Communication: No apparent difficulties   Cognition Arousal: Alert Behavior During Therapy: WFL for tasks assessed/performed                                 Following commands: Intact        Cueing   Cueing Techniques: Verbal  cues  Exercises Other Exercises Other Exercises: Spoke with pt regarding meal prep, etc. he has good set up at home and dynamic balance intact for all kitchen tasks.    Shoulder Instructions       General Comments      Pertinent Vitals/ Pain       Pain Assessment Pain Assessment: No/denies pain Pain Intervention(s): Monitored during session  Home Living                                          Prior Functioning/Environment              Frequency           Progress Toward Goals  OT Goals(current goals can now be found in the care plan section)  Progress towards OT goals: Progressing toward goals  Acute Rehab OT Goals Patient Stated Goal: return home OT Goal Formulation: With patient/family Time For Goal Achievement:  05/19/24 Potential to Achieve Goals: Fair  Plan      Co-evaluation                 AM-PAC OT "6 Clicks" Daily Activity     Outcome Measure   Help from another person eating meals?: None Help from another person taking care of personal grooming?: None Help from another person toileting, which includes using toliet, bedpan, or urinal?: None Help from another person bathing (including washing, rinsing, drying)?: None Help from another person to put on and taking off regular upper body clothing?: None Help from another person to put on and taking off regular lower body clothing?: None 6 Click Score: 24    End of Session Equipment Utilized During Treatment: Gait belt;Rolling walker (2 wheels)  OT Visit Diagnosis: Unsteadiness on feet (R26.81);Muscle weakness (generalized) (M62.81)   Activity Tolerance Patient tolerated treatment well   Patient Left with family/visitor present;with bed alarm set;in bed   Nurse Communication Mobility status        Time: 1610-9604 OT Time Calculation (min): 13 min  Charges: OT General Charges $OT Visit: 1 Visit OT Treatments $Therapeutic Activity: 8-22 mins  Sheena Simonis, OTR/L  05/10/24, 1:05 PM   Secundino Ellithorpe E Khyra Viscuso 05/10/2024, 1:03 PM

## 2024-05-10 NOTE — Plan of Care (Signed)
  Problem: Nutrition: Goal: Adequate nutrition will be maintained Outcome: Progressing   Problem: Coping: Goal: Level of anxiety will decrease Outcome: Progressing   Problem: Elimination: Goal: Will not experience complications related to bowel motility Outcome: Progressing Goal: Will not experience complications related to urinary retention Outcome: Progressing   Problem: Pain Managment: Goal: General experience of comfort will improve and/or be controlled Outcome: Progressing   Problem: Safety: Goal: Ability to remain free from injury will improve Outcome: Progressing   Problem: Coping: Goal: Ability to adjust to condition or change in health will improve Outcome: Progressing

## 2024-05-10 NOTE — Progress Notes (Signed)
 SLP Cancellation Note  Patient Details Name: Rick Wang MRN: [id number] DOB: 02-May-1956   Cancelled treatment:       Reason Eval/Treat Not Completed:  (chart reviewed; consulted NSG/MD re: pt's diet status)  Per pt's request, he has asked for his diet to be a Regular diet consistency so that he can have "pizza and corn" per his report to NSG staff.  At admit, BSE on 05/04/2024 revealed No pharyngeal swallowing issues; pharyngeal swallow appeared [facility] per clinical assessment. Pt with known hx of solid dysphagia "for years" per pt report. Pt endorsed particular difficulty with dry, particulate solids. A mech soft diet with thin liquids with standard aspiration and reflux precautions was initiated then. NSG reports no difficulty w/ those meals.  Post consult w/ MD yesterday, MD agreed w/ trial of Regular diet consistency to meet pt's food requests. NSG reports No difficulty swallowing at meals yesterday. No skilled, acute ST services indicated as pt appears to tolerate the diet consistency. MD to reconsult if new needs arise during admit. NSG updated, agreed.       Darla Edward, MS, CCC-SLP Speech Language Pathologist Rehab Services; [facility] [phone] (ascom) Yemariam Magar 05/10/2024, 8:15 AM

## 2024-05-10 NOTE — Progress Notes (Signed)
 [facility]  ROUNDING NOTE   Subjective:   Patient sitting up in chair Alert and oriented Wife at bedside   UOP  Creatinine 2.26 (2.39) (2.67) (3.09)  Objective:  Vital signs in last 24 hours:  Temp:  [98 F (36.7 C)-98.3 F (36.8 C)] 98 F (36.7 C) (05/28 0749) Pulse Rate:  [66-75] 73 (05/28 0749) Resp:  [16-18] 18 (05/28 0749) BP: (121-135)/(55-60) 135/55 (05/28 0749) SpO2:  [93 %-97 %] 93 % (05/28 0749)  Weight change:  Filed Weights   05/07/24 0500 05/08/24 0403 05/09/24 0428  Weight: 75.8 kg 74.8 kg 79 kg    Intake/Output: I/O last 3 completed shifts: In: 450.2 [P.O.:240; I.V.:110.2; IV Piggyback:100] Out: 2600 [Urine:2600]   Intake/Output this shift:  Total I/O In: 240 [P.O.:240] Out: -   Physical Exam: General: NAD, laying in bed  Head: Normocephalic, atraumatic. Moist oral mucosal membranes  Eyes: Anicteric  Lungs:  Clear to auscultation, normal effort  Heart: Regular rate and rhythm  Abdomen:  Soft, nontender  Extremities: No peripheral edema.  Neurologic: Alert and oriented, moving all four extremities  Skin: No lesions  Access: None    Basic Metabolic Panel: Recent Labs  Lab 05/06/24 0452 05/07/24 0525 05/08/24 0115 05/09/24 0431 05/10/24 0424  NA 138 140 133* 136 138  K 4.3 3.7 3.7 3.9 3.3*  CL 107 105 103 103 104  CO2 18* 26 19* 19* 23  GLUCOSE 120* 76 174* 239* 143*  BUN 105* 112* 111* 111* 101*  CREATININE 3.38* 3.09* 2.67* 2.39* 2.26*  CALCIUM 9.1 9.1 8.8* 9.0 8.8*  MG 2.4 2.6* 2.5* 2.5* 2.5*  PHOS 5.5* 4.9* 4.7* 4.8* 4.4    Liver Function Tests: No results for input(s): "AST", "ALT", "ALKPHOS", "BILITOT", "PROT", "ALBUMIN" in the last 168 hours.  No results for input(s): "LIPASE", "AMYLASE" in the last 168 hours. No results for input(s): "AMMONIA" in the last 168 hours.  CBC: Recent Labs  Lab 05/07/24 1501 05/08/24 0115 05/08/24 0647 05/09/24 0431 05/10/24 0424  WBC 17.9* 19.6* 22.4* 20.7*  15.4*  NEUTROABS  --   --  16.6*  --   --   HGB 10.0* 9.8* 9.8* 9.4* 8.3*  HCT 30.1* 29.0* 29.9* 28.2* 25.7*  MCV 92.0 91.2 94.0 91.3 93.5  PLT 30* 37* 43* 63* 94*    Cardiac Enzymes: No results for input(s): "CKTOTAL", "CKMB", "CKMBINDEX", "TROPONINI" in the last 168 hours.  BNP: Invalid input(s): "POCBNP"  CBG: Recent Labs  Lab 05/09/24 1620 05/09/24 1940 05/09/24 2326 05/10/24 0323 05/10/24 0750  GLUCAP 326* 211* 146* 140* 227*    Microbiology: Results for orders placed or performed during the hospital encounter of 05/02/24  Culture, blood (Routine x 2)     Status: Abnormal   Collection Time: 05/02/24  9:57 PM   Specimen: BLOOD  Result Value Ref Range Status   Specimen Description   Final    BLOOD BLOOD RIGHT ARM Performed at [facility], [address] [place], [place] [address]    Special Requests   Final    BOTTLES DRAWN AEROBIC AND ANAEROBIC Blood Culture adequate volume Performed at [facility], [address], [place], [place] [address]    Culture  Setup Time   Final    GRAM NEGATIVE RODS IN BOTH AEROBIC AND ANAEROBIC BOTTLES CRITICAL VALUE NOTED.  VALUE IS CONSISTENT WITH PREVIOUSLY REPORTED AND CALLED VALUE. Performed at [facility], [address] [place], [place] [address]    Culture (A)  Final    ESCHERICHIA COLI SUSCEPTIBILITIES PERFORMED ON PREVIOUS CULTURE WITHIN THE LAST 5 DAYS. Performed at [facility], [address], [place], [place] [address]    Report Status 05/05/2024 FINAL  Final  Culture, blood (Routine x 2)     Status: Abnormal   Collection Time: 05/02/24  9:58 PM   Specimen: BLOOD  Result Value Ref Range Status   Specimen Description   Final    BLOOD BLOOD RIGHT ARM Performed at [facility], [address] [place], [place] [address]    Special Requests   Final    BOTTLES DRAWN AEROBIC AND ANAEROBIC Blood Culture adequate volume Performed at [facility], [address] [place], [place] [address]    Culture  Setup Time   Final    GRAM NEGATIVE RODS IN BOTH AEROBIC AND ANAEROBIC BOTTLES CRITICAL RESULT CALLED TO, READ BACK BY AND VERIFIED WITH: SHEEMA HALLAJI PHARMD 1020 05/03/24 HNM    Culture ESCHERICHIA COLI (A)  Final   Report Status 05/05/2024 FINAL  Final   Organism ID, Bacteria ESCHERICHIA COLI  Final   Organism ID, Bacteria ESCHERICHIA COLI  Final      Susceptibility   Escherichia coli - KIRBY BAUER*    CEFAZOLIN SENSITIVE Sensitive    Escherichia coli - MIC*    AMPICILLIN 4 SENSITIVE Sensitive     CEFEPIME <=0.12 SENSITIVE Sensitive     CEFTAZIDIME <=1 SENSITIVE Sensitive     CEFTRIAXONE <=0.25 SENSITIVE Sensitive     CIPROFLOXACIN <=0.25 SENSITIVE Sensitive     GENTAMICIN <=1 SENSITIVE Sensitive     IMIPENEM <=0.25 SENSITIVE Sensitive     TRIMETH/SULFA <=20 SENSITIVE Sensitive     AMPICILLIN/SULBACTAM <=2 SENSITIVE Sensitive     PIP/TAZO <=4 SENSITIVE Sensitive ug/mL    * ESCHERICHIA COLI    ESCHERICHIA COLI  Blood Culture ID Panel (Reflexed)     Status: Abnormal   Collection Time: 05/02/24  9:58 PM  Result Value Ref Range Status   Enterococcus faecalis NOT DETECTED NOT DETECTED Final   Enterococcus Faecium NOT DETECTED NOT DETECTED Final   Listeria monocytogenes NOT DETECTED NOT DETECTED Final   Staphylococcus species NOT DETECTED NOT DETECTED Final   Staphylococcus aureus (BCID) NOT DETECTED NOT DETECTED Final   Staphylococcus epidermidis NOT DETECTED NOT DETECTED Final   Staphylococcus lugdunensis NOT DETECTED NOT DETECTED Final   Streptococcus species NOT DETECTED NOT DETECTED Final   Streptococcus agalactiae NOT DETECTED NOT DETECTED Final   Streptococcus pneumoniae NOT DETECTED NOT DETECTED Final   Streptococcus pyogenes NOT DETECTED NOT DETECTED Final   A.calcoaceticus-baumannii NOT DETECTED NOT DETECTED Final   Bacteroides fragilis NOT DETECTED NOT DETECTED Final   Enterobacterales DETECTED (A) NOT DETECTED Final     Comment: Enterobacterales represent a large order of gram negative bacteria, not a single organism. CRITICAL RESULT CALLED TO, READ BACK BY AND VERIFIED WITH: SHEEMA HALLAJI PHARMD 1020 05/03/24 HNM    Enterobacter cloacae complex NOT DETECTED NOT DETECTED Final   Escherichia coli DETECTED (A) NOT DETECTED Final    Comment: CRITICAL RESULT CALLED TO, READ BACK BY AND VERIFIED WITH: SHEEMA HALLAJI PHARMD 1020 05/03/24 HNM    Klebsiella aerogenes NOT DETECTED NOT DETECTED Final   Klebsiella oxytoca NOT DETECTED NOT DETECTED Final   Klebsiella pneumoniae NOT DETECTED NOT DETECTED Final   Proteus species NOT DETECTED NOT DETECTED Final   Salmonella species NOT DETECTED NOT DETECTED Final   Serratia marcescens NOT DETECTED NOT DETECTED Final   Haemophilus  influenzae NOT DETECTED NOT DETECTED Final   Neisseria meningitidis NOT DETECTED NOT DETECTED Final   Pseudomonas aeruginosa NOT DETECTED NOT DETECTED Final   Stenotrophomonas maltophilia NOT DETECTED NOT DETECTED Final   Candida albicans NOT DETECTED NOT DETECTED Final   Candida auris NOT DETECTED NOT DETECTED Final   Candida glabrata NOT DETECTED NOT DETECTED Final   Candida krusei NOT DETECTED NOT DETECTED Final   Candida parapsilosis NOT DETECTED NOT DETECTED Final   Candida tropicalis NOT DETECTED NOT DETECTED Final   Cryptococcus neoformans/gattii NOT DETECTED NOT DETECTED Final   CTX-M ESBL NOT DETECTED NOT DETECTED Final   Carbapenem resistance IMP NOT DETECTED NOT DETECTED Final   Carbapenem resistance KPC NOT DETECTED NOT DETECTED Final   Carbapenem resistance NDM NOT DETECTED NOT DETECTED Final   Carbapenem resist OXA 48 LIKE NOT DETECTED NOT DETECTED Final   Carbapenem resistance VIM NOT DETECTED NOT DETECTED Final    Comment: Performed at [facility], [address] [place], [place] [address]  Resp panel by RT-PCR (RSV, Flu A&B, Covid) Anterior Nasal Swab     Status: None   Collection Time: 05/02/24 11:17 PM    Specimen: Anterior Nasal Swab  Result Value Ref Range Status   SARS Coronavirus 2 by RT PCR NEGATIVE NEGATIVE Final    Comment: (NOTE) SARS-CoV-2 target nucleic acids are NOT DETECTED.  The SARS-CoV-2 RNA is generally detectable in upper respiratory specimens during the acute phase of infection. The lowest concentration of SARS-CoV-2 viral copies this assay can detect is 138 copies/mL. A negative result does not preclude SARS-Cov-2 infection and should not be used as the sole basis for treatment or other patient management decisions. A negative result may occur with  improper specimen collection/handling, submission of specimen other than nasopharyngeal swab, presence of viral mutation(s) within the areas targeted by this assay, and inadequate number of viral copies(<138 copies/mL). A negative result must be combined with clinical observations, patient history, and epidemiological information. The expected result is Negative.  Fact Sheet for Patients:  BloggerCourse.com  Fact Sheet for Healthcare Providers:  SeriousBroker.it  This test is no t yet approved or cleared by the [place]  FDA and  has been authorized for detection and/or diagnosis of SARS-CoV-2 by FDA under an Emergency Use Authorization (EUA). This EUA will remain  in effect (meaning this test can be used) for the duration of the COVID-19 declaration under Section 564(b)(1) of the Act, 21 U.S.C.section 360bbb-3(b)(1), unless the authorization is terminated  or revoked sooner.       Influenza A by PCR NEGATIVE NEGATIVE Final   Influenza B by PCR NEGATIVE NEGATIVE Final    Comment: (NOTE) The Xpert Xpress SARS-CoV-2/FLU/RSV plus assay is intended as an aid in the diagnosis of influenza from Nasopharyngeal swab specimens and should not be used as a sole basis for treatment. Nasal washings and aspirates are unacceptable for Xpert Xpress  SARS-CoV-2/FLU/RSV testing.  Fact Sheet for Patients: BloggerCourse.com  Fact Sheet for Healthcare Providers: SeriousBroker.it  This test is not yet approved or cleared by the [place]  FDA and has been authorized for detection and/or diagnosis of SARS-CoV-2 by FDA under an Emergency Use Authorization (EUA). This EUA will remain in effect (meaning this test can be used) for the duration of the COVID-19 declaration under Section 564(b)(1) of the Act, 21 U.S.C. section 360bbb-3(b)(1), unless the authorization is terminated or revoked.     Resp Syncytial Virus by PCR NEGATIVE NEGATIVE Final    Comment: (NOTE) Fact Sheet  for Patients: BloggerCourse.com  Fact Sheet for Healthcare Providers: SeriousBroker.it  This test is not yet approved or cleared by the [place]  FDA and has been authorized for detection and/or diagnosis of SARS-CoV-2 by FDA under an Emergency Use Authorization (EUA). This EUA will remain in effect (meaning this test can be used) for the duration of the COVID-19 declaration under Section 564(b)(1) of the Act, 21 U.S.C. section 360bbb-3(b)(1), unless the authorization is terminated or revoked.  Performed at [facility], [address] [place], [place] [address]   Urine Culture     Status: Abnormal   Collection Time: 05/03/24 12:18 AM   Specimen: Urine, Random  Result Value Ref Range Status   Specimen Description   Final    URINE, RANDOM Performed at [facility], [address] [place], [place] [address]    Special Requests   Final    NONE Reflexed from [id number] Performed at [facility], [address] [place], [place] [address]    Culture >=100,000 COLONIES/mL ESCHERICHIA COLI (A)  Final   Report Status 05/05/2024 FINAL  Final   Organism ID, Bacteria ESCHERICHIA COLI (A)  Final      Susceptibility   Escherichia  coli - MIC*    AMPICILLIN 4 SENSITIVE Sensitive     CEFAZOLIN <=4 SENSITIVE Sensitive     CEFEPIME  <=0.12 SENSITIVE Sensitive     CEFTRIAXONE  <=0.25 SENSITIVE Sensitive     CIPROFLOXACIN <=0.25 SENSITIVE Sensitive     GENTAMICIN <=1 SENSITIVE Sensitive     IMIPENEM <=0.25 SENSITIVE Sensitive     NITROFURANTOIN <=16 SENSITIVE Sensitive     TRIMETH/SULFA <=20 SENSITIVE Sensitive     AMPICILLIN/SULBACTAM <=2 SENSITIVE Sensitive     PIP/TAZO <=4 SENSITIVE Sensitive ug/mL    * >=100,000 COLONIES/mL ESCHERICHIA COLI  MRSA Next Gen by PCR, Nasal     Status: Abnormal   Collection Time: 05/03/24  4:00 AM   Specimen: Nasal Mucosa; Nasal Swab  Result Value Ref Range Status   MRSA by PCR Next Gen DETECTED (A) NOT DETECTED Final    Comment: RESULT CALLED TO, READ BACK BY AND VERIFIED WITH: Salina Craver RN [phone] 05/03/24 HNM (NOTE) The GeneXpert MRSA Assay (FDA approved for NASAL specimens only), is one component of a comprehensive MRSA colonization surveillance program. It is not intended to diagnose MRSA infection nor to guide or monitor treatment for MRSA infections. Test performance is not FDA approved in patients less than 66 years old. Performed at [facility], [address] [place], [place] [address]   Culture, blood (Routine X 2) w Reflex to ID Panel     Status: None   Collection Time: 05/05/24 12:17 AM   Specimen: BLOOD RIGHT ARM  Result Value Ref Range Status   Specimen Description BLOOD RIGHT ARM  Final   Special Requests   Final    BOTTLES DRAWN AEROBIC AND ANAEROBIC Blood Culture adequate volume   Culture   Final    NO GROWTH 5 DAYS Performed at [facility], [address] [place], [place] [address]    Report Status 05/10/2024 FINAL  Final  Culture, blood (Routine X 2) w Reflex to ID Panel     Status: None   Collection Time: 05/05/24 12:17 AM   Specimen: BLOOD LEFT HAND  Result Value Ref Range Status   Specimen Description BLOOD LEFT HAND  Final    Special Requests   Final    BOTTLES DRAWN AEROBIC AND ANAEROBIC Blood Culture adequate volume  Culture   Final    NO GROWTH 5 DAYS Performed at [facility], [address] [place], [place] [address]    Report Status 05/10/2024 FINAL  Final  Culture, blood (Routine X 2) w Reflex to ID Panel     Status: None (Preliminary result)   Collection Time: 05/08/24  6:47 AM   Specimen: BLOOD  Result Value Ref Range Status   Specimen Description BLOOD BLOOD LEFT HAND  Final   Special Requests   Final    BOTTLES DRAWN AEROBIC AND ANAEROBIC Blood Culture adequate volume   Culture   Final    NO GROWTH 2 DAYS Performed at [facility], [address], [place], [place] [address]    Report Status PENDING  Incomplete  Culture, blood (Routine X 2) w Reflex to ID Panel     Status: None (Preliminary result)   Collection Time: 05/08/24  6:47 AM   Specimen: BLOOD  Result Value Ref Range Status   Specimen Description BLOOD BLOOD LEFT ARM  Final   Special Requests   Final    BOTTLES DRAWN AEROBIC AND ANAEROBIC Blood Culture adequate volume   Culture   Final    NO GROWTH 2 DAYS Performed at [facility], [address] [place], [place] [address]    Report Status PENDING  Incomplete    Coagulation Studies: Recent Labs    05/08/24 0947  LABPROT 31.9*  INR 3.1*    Urinalysis: No results for input(s): "COLORURINE", "LABSPEC", "PHURINE", "GLUCOSEU", "HGBUR", "BILIRUBINUR", "KETONESUR", "PROTEINUR", "UROBILINOGEN", "NITRITE", "LEUKOCYTESUR" in the last 72 hours.  Invalid input(s): "APPERANCEUR"    Imaging: No results found.    Medications:      amoxicillin   1,000 mg Oral Q8H   brimonidine  1 drop Right Eye TID   brinzolamide  1 drop Right Eye TID   Chlorhexidine Gluconate Cloth  6 each Topical Daily   insulin  aspart  0-9 Units Subcutaneous Q4H   insulin  aspart  4 Units Subcutaneous TID WC   insulin  glargine-yfgn  10 Units Subcutaneous Daily   metoprolol  tartrate  25 mg Oral BID   mycophenolate  360 mg Oral BID   pantoprazole  40 mg Oral BID   sodium bicarbonate  650 mg Oral TID   tacrolimus   1 mg Oral Q1200   tacrolimus   2 mg Oral Q1200   tamsulosin  0.4 mg Oral QPC supper   acetaminophen, docusate sodium, fentaNYL (SUBLIMAZE) injection, ondansetron (ZOFRAN) IV, mouth rinse, polyethylene glycol, traZODone  Assessment/ Plan:  Mr. Rick Wang is a 68 y.o.  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  Patient currently follows with [facility] nephrology, Dr. Helga Loan.  Baseline creatinine  1.4-1.6.  Creatinine peaked at 3.9. Acute kidney injury likely secondary to concurrent illness, septic shock and hypovolemia.   - Continue mycophenolate and Tacrolimus  2mg  in am, 1mg  in evening. - acute kidney injury complicated by acute metabolic acidosis: corrected with oral sodium bicarbonate -  Weaned hydrocortisone  Lab Results  Component Value Date   CREATININE 2.26 (H) 05/10/2024   CREATININE 2.39 (H) 05/09/2024   CREATININE 2.67 (H) 05/08/2024        Intake/Output Summary (Last 24 hours) at 05/10/2024 1104 Last data filed at 05/10/2024 1005 Gross per 24 hour  Intake 393.54 ml  Output 1700 ml  Net -1306.46 ml      2.  Sepsis secondary to E. coli bacteremia UTI.  Pansensitive  - Foley catheter removed this am  - Transitioned to oral amoxicillin   -  Appreciate ID input   3. Anemia of chronic kidney disease Hemoglobin & Hematocrit     Component Value Date/Time   HGB 8.3 (L) 05/10/2024 0424   HGB 9.9 (L) 04/03/2015 0836   HCT 25.7 (L) 05/10/2024 0424   HCT 30.6 (L) 04/03/2015 0836   Hemoglobin slightly decreased. Will continue to monitor.   4. Secondary Hyperparathyroidism:    Lab Results  Component Value Date   CALCIUM 8.8 (L) 05/10/2024   PHOS 4.4 05/10/2024    Bone minerals remain acceptable.    LOS: 7 Charle Clear 5/28/202511:04 AM

## 2024-05-10 NOTE — Progress Notes (Signed)
 Date of Admission:  05/02/2024    ID: Rick Wang is a 68 y.o. male Principal Problem:   Severe sepsis (HCC) Active Problems:   E coli bacteremia   AKI (acute kidney injury) (HCC)   Complicated UTI (urinary tract infection)  Wife at bed side  Subjective: Pt is doing better He walked in the corridor  Medications:   amoxicillin   1,000 mg Oral Q8H   brimonidine  1 drop Right Eye TID   brinzolamide  1 drop Right Eye TID   Chlorhexidine Gluconate Cloth  6 each Topical Daily   insulin  aspart  0-9 Units Subcutaneous Q4H   insulin  aspart  4 Units Subcutaneous TID WC   insulin  glargine-yfgn  10 Units Subcutaneous Daily   metoprolol tartrate  25 mg Oral BID   mycophenolate  360 mg Oral BID   pantoprazole  40 mg Oral BID   sodium bicarbonate  650 mg Oral TID   tacrolimus   1 mg Oral Q1200   tacrolimus   2 mg Oral Q1200   tamsulosin  0.4 mg Oral QPC supper    Objective: Vital signs in last 24 hours: Patient Vitals for the past 24 hrs:  BP Temp Temp src Pulse Resp SpO2  05/10/24 0749 (!) 135/55 98 F (36.7 C) -- 73 18 93 %  05/10/24 0331 128/60 98.3 F (36.8 C) -- 70 -- 97 %  05/09/24 2144 (!) 126/55 -- -- 75 -- --  05/09/24 1942 127/60 98.3 F (36.8 C) Oral 73 16 96 %  05/09/24 1617 (!) 121/57 98 F (36.7 C) Oral 71 17 94 %       PHYSICAL EXAM:  General: Alert, cooperative,  Lungs: b/l air entry- Heart:s1s2 Abdomen: Soft, non-tender,not distended. Bowel sounds normal. No masses Extremities: atraumatic, no cyanosis. No edema. No clubbing Skin: No rashes or lesions. Or bruising Lymph: Cervical, supraclavicular normal. Neurologic: Grossly non-focal foley has been removed this morning  Lab Results    Latest Ref Rng & Units 05/10/2024    4:24 AM 05/09/2024    4:31 AM 05/08/2024    6:47 AM  CBC  WBC 4.0 - 10.5 K/uL 15.4  20.7  22.4   Hemoglobin 13.0 - 17.0 g/dL 8.3  9.4  9.8   Hematocrit 39.0 - 52.0 % 25.7  28.2  29.9   Platelets 150 - 400 K/uL 94  63  43         Latest Ref Rng & Units 05/10/2024    4:24 AM 05/09/2024    4:31 AM 05/08/2024    1:15 AM  CMP  Glucose 70 - 99 mg/dL 161  096  045   BUN 8 - 23 mg/dL 409  811  914   Creatinine 0.61 - 1.24 mg/dL 7.82  9.56  2.13   Sodium 135 - 145 mmol/L 138  136  133   Potassium 3.5 - 5.1 mmol/L 3.3  3.9  3.7   Chloride 98 - 111 mmol/L 104  103  103   CO2 22 - 32 mmol/L 23  19  19    Calcium 8.9 - 10.3 mg/dL 8.8  9.0  8.8       Microbiology: 5/20 both sets blood culture positive for Ecoli UC 5/21- Ecoli Studies/Results: No results found.    Assessment/Plan: Ecoli bacteremia with ecoli UTI- on ceftriaxone- pan sensitive organism Leucocytosis persist , though cr has improved, procal has improved a lot CT was done and it does not show any pathology in the transplant kidney Repeat  Blood culture no. 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

## 2024-05-10 NOTE — TOC Progression Note (Signed)
 Transition of Care [facility]) - Progression Note    Patient Details  Name: Rick Wang MRN: [id number] Date of Birth: 08/08/1956  Transition of Care [facility]) CM/SW Contact  Loman Risk, RN Phone Number: 05/10/2024, 3:31 PM  Clinical Narrative:     Spouse states that she as a RW in the home for the patient to use, as well as a shower chair.  Patient declines [facility]        Expected Discharge Plan and Services                                               Social Determinants of Health (SDOH) Interventions SDOH Screenings   Food Insecurity: No Food Insecurity (05/03/2024)  Housing: Low Risk  (05/03/2024)  Transportation Needs: No Transportation Needs (05/03/2024)  Utilities: Not At Risk (05/03/2024)  Alcohol Screen: Low Risk  (11/25/2022)  Depression (PHQ2-9): Medium Risk (11/25/2022)  Financial Resource Strain: Low Risk  (07/01/2023)   Received from [facility]  Physical Activity: Sufficiently Active (07/01/2023)   Received from [facility]  Social Connections: Socially Integrated (05/03/2024)  Tobacco Use: Low Risk  (05/02/2024)  Health Literacy: Adequate Health Literacy (07/01/2023)   Received from [facility]    Readmission Risk Interventions     No data to display

## 2024-05-11 ENCOUNTER — Other Ambulatory Visit: Payer: Self-pay

## 2024-05-11 DIAGNOSIS — A419 Sepsis, unspecified organism: Secondary | ICD-10-CM | POA: Diagnosis not present

## 2024-05-11 DIAGNOSIS — R6520 Severe sepsis without septic shock: Secondary | ICD-10-CM | POA: Diagnosis not present

## 2024-05-11 LAB — CBC
HCT: 28.3 % — ABNORMAL LOW (ref 39.0–52.0)
Hemoglobin: 9.2 g/dL — ABNORMAL LOW (ref 13.0–17.0)
MCH: 30.6 pg (ref 26.0–34.0)
MCHC: 32.5 g/dL (ref 30.0–36.0)
MCV: 94 fL (ref 80.0–100.0)
Platelets: 131 10*3/uL — ABNORMAL LOW (ref 150–400)
RBC: 3.01 MIL/uL — ABNORMAL LOW (ref 4.22–5.81)
RDW: 14.7 % (ref 11.5–15.5)
WBC: 12.6 10*3/uL — ABNORMAL HIGH (ref 4.0–10.5)
nRBC: 0 % (ref 0.0–0.2)

## 2024-05-11 LAB — BASIC METABOLIC PANEL WITH GFR
Anion gap: 7 (ref 5–15)
BUN: 89 mg/dL — ABNORMAL HIGH (ref 8–23)
CO2: 25 mmol/L (ref 22–32)
Calcium: 9 mg/dL (ref 8.9–10.3)
Chloride: 106 mmol/L (ref 98–111)
Creatinine, Ser: 2.11 mg/dL — ABNORMAL HIGH (ref 0.61–1.24)
GFR, Estimated: 34 mL/min — ABNORMAL LOW (ref >60)
Glucose, Bld: 188 mg/dL — ABNORMAL HIGH (ref 70–99)
Potassium: 3.8 mmol/L (ref 3.5–5.1)
Sodium: 138 mmol/L (ref 135–145)

## 2024-05-11 LAB — PHOSPHORUS: Phosphorus: 3.7 mg/dL (ref 2.5–4.6)

## 2024-05-11 LAB — GLUCOSE, CAPILLARY
Glucose-Capillary: 173 mg/dL — ABNORMAL HIGH (ref 70–99)
Glucose-Capillary: 173 mg/dL — ABNORMAL HIGH (ref 70–99)
Glucose-Capillary: 222 mg/dL — ABNORMAL HIGH (ref 70–99)
Glucose-Capillary: 284 mg/dL — ABNORMAL HIGH (ref 70–99)

## 2024-05-11 LAB — MAGNESIUM: Magnesium: 2.3 mg/dL (ref 1.7–2.4)

## 2024-05-11 MED ORDER — AMOXICILLIN 500 MG PO CAPS
1000.0000 mg | ORAL_CAPSULE | Freq: Three times a day (TID) | ORAL | 0 refills | Status: AC
  Filled 2024-05-11: qty 36, 6d supply, fill #0

## 2024-05-11 MED ORDER — METOPROLOL TARTRATE 25 MG PO TABS
25.0000 mg | ORAL_TABLET | Freq: Two times a day (BID) | ORAL | 11 refills | Status: AC
  Filled 2024-05-11: qty 60, 30d supply, fill #0

## 2024-05-11 MED ORDER — TAMSULOSIN HCL 0.4 MG PO CAPS
0.4000 mg | ORAL_CAPSULE | Freq: Every day | ORAL | 2 refills | Status: AC
  Filled 2024-05-11: qty 30, 30d supply, fill #0

## 2024-05-11 MED ORDER — DEXTROMETHORPHAN-GUAIFENESIN 20-200 MG/20ML PO LIQD
20.0000 mL | Freq: Four times a day (QID) | ORAL | 0 refills | Status: AC | PRN
  Filled 2024-05-11: qty 118, 5d supply, fill #0

## 2024-05-11 NOTE — TOC Transition Note (Signed)
 Transition of Care [facility]) - Discharge Note   Patient Details  Name: Rick Wang MRN: [id number] Date of Birth: Sep 29, 1956  Transition of Care [facility]) CM/SW Contact:  Odilia Bennett, LCSW Phone Number: 05/11/2024, 10:55 AM   Clinical Narrative:  Patient has orders to discharge home today. No further concerns. CSW signing off.   Final next level of care: OP Rehab Barriers to Discharge: Barriers Resolved   Patient Goals and CMS Choice            Discharge Placement                    Patient and family notified of of transfer: 05/11/24  Discharge Plan and Services Additional resources added to the After Visit Summary for                                       Social Drivers of Health (SDOH) Interventions SDOH Screenings   Food Insecurity: No Food Insecurity (05/03/2024)  Housing: Low Risk  (05/03/2024)  Transportation Needs: No Transportation Needs (05/03/2024)  Utilities: Not At Risk (05/03/2024)  Alcohol Screen: Low Risk  (11/25/2022)  Depression (PHQ2-9): Medium Risk (11/25/2022)  Financial Resource Strain: Low Risk  (07/01/2023)   Received from [facility]  Physical Activity: Sufficiently Active (07/01/2023)   Received from [facility]  Social Connections: Socially Integrated (05/03/2024)  Tobacco Use: Low Risk  (05/02/2024)  Health Literacy: Adequate Health Literacy (07/01/2023)   Received from [facility]     Readmission Risk Interventions     No data to display

## 2024-05-11 NOTE — Plan of Care (Signed)
  Problem: Education: Goal: Knowledge of General Education information will improve Description: Including pain rating scale, medication(s)/side effects and non-pharmacologic comfort measures Outcome: Adequate for Discharge   Problem: Health Behavior/Discharge Planning: Goal: Ability to manage health-related needs will improve Outcome: Adequate for Discharge   Problem: Clinical Measurements: Goal: Ability to maintain clinical measurements within normal limits will improve Outcome: Adequate for Discharge Goal: Will remain free from infection Outcome: Adequate for Discharge Goal: Diagnostic test results will improve Outcome: Adequate for Discharge Goal: Respiratory complications will improve Outcome: Adequate for Discharge Goal: Cardiovascular complication will be avoided Outcome: Adequate for Discharge   Problem: Activity: Goal: Risk for activity intolerance will decrease Outcome: Adequate for Discharge   Problem: Nutrition: Goal: Adequate nutrition will be maintained Outcome: Adequate for Discharge   Problem: Coping: Goal: Level of anxiety will decrease Outcome: Adequate for Discharge   Problem: Elimination: Goal: Will not experience complications related to bowel motility Outcome: Adequate for Discharge Goal: Will not experience complications related to urinary retention Outcome: Adequate for Discharge   Problem: Pain Managment: Goal: General experience of comfort will improve and/or be controlled Outcome: Adequate for Discharge   Problem: Safety: Goal: Ability to remain free from injury will improve Outcome: Adequate for Discharge   Problem: Skin Integrity: Goal: Risk for impaired skin integrity will decrease Outcome: Adequate for Discharge   Problem: Education: Goal: Ability to describe self-care measures that may prevent or decrease complications (Diabetes Survival Skills Education) will improve Outcome: Adequate for Discharge   Problem: Coping: Goal:  Ability to adjust to condition or change in health will improve Outcome: Adequate for Discharge   Problem: Fluid Volume: Goal: Ability to maintain a balanced intake and output will improve Outcome: Adequate for Discharge   Problem: Health Behavior/Discharge Planning: Goal: Ability to identify and utilize available resources and services will improve Outcome: Adequate for Discharge Goal: Ability to manage health-related needs will improve Outcome: Adequate for Discharge   Problem: Metabolic: Goal: Ability to maintain appropriate glucose levels will improve Outcome: Adequate for Discharge   Problem: Nutritional: Goal: Maintenance of adequate nutrition will improve Outcome: Adequate for Discharge Goal: Progress toward achieving an optimal weight will improve Outcome: Adequate for Discharge   Problem: Skin Integrity: Goal: Risk for impaired skin integrity will decrease Outcome: Adequate for Discharge   Problem: Tissue Perfusion: Goal: Adequacy of tissue perfusion will improve Outcome: Adequate for Discharge

## 2024-05-11 NOTE — Progress Notes (Signed)
 [facility]  ROUNDING NOTE   Subjective:   Patient seen laying in bed Wife at bedside States he feels well today  UOP  Creatinine 2.11 (2.26) (2.39) (2.67) (3.09)  Objective:  Vital signs in last 24 hours:  Temp:  [97.6 F (36.4 C)-98.4 F (36.9 C)] 97.6 F (36.4 C) (05/29 0746) Pulse Rate:  [71-76] 76 (05/29 0746) Resp:  [16-18] 17 (05/29 0746) BP: (136-151)/(62-72) 151/72 (05/29 0746) SpO2:  [95 %-97 %] 96 % (05/29 0746)  Weight change:  Filed Weights   05/07/24 0500 05/08/24 0403 05/09/24 0428  Weight: 75.8 kg 74.8 kg 79 kg    Intake/Output: I/O last 3 completed shifts: In: 420 [P.O.:420] Out: 2650 [Urine:2650]   Intake/Output this shift:  No intake/output data recorded.  Physical Exam: General: NAD, laying in bed  Head: Normocephalic, atraumatic. Moist oral mucosal membranes  Eyes: Anicteric  Lungs:  Clear to auscultation, normal effort  Heart: Regular rate and rhythm  Abdomen:  Soft, nontender  Extremities: No peripheral edema.  Neurologic: Alert and oriented, moving all four extremities  Skin: No lesions  Access: None    Basic Metabolic Panel: Recent Labs  Lab 05/07/24 0525 05/08/24 0115 05/09/24 0431 05/10/24 0424 05/11/24 0433  NA 140 133* 136 138 138  K 3.7 3.7 3.9 3.3* 3.8  CL 105 103 103 104 106  CO2 26 19* 19* 23 25  GLUCOSE 76 174* 239* 143* 188*  BUN 112* 111* 111* 101* 89*  CREATININE 3.09* 2.67* 2.39* 2.26* 2.11*  CALCIUM 9.1 8.8* 9.0 8.8* 9.0  MG 2.6* 2.5* 2.5* 2.5* 2.3  PHOS 4.9* 4.7* 4.8* 4.4 3.7    Liver Function Tests: No results for input(s): "AST", "ALT", "ALKPHOS", "BILITOT", "PROT", "ALBUMIN" in the last 168 hours.  No results for input(s): "LIPASE", "AMYLASE" in the last 168 hours. No results for input(s): "AMMONIA" in the last 168 hours.  CBC: Recent Labs  Lab 05/08/24 0115 05/08/24 0647 05/09/24 0431 05/10/24 0424 05/11/24 0433  WBC 19.6* 22.4* 20.7* 15.4* 12.6*  NEUTROABS  --  16.6*   --   --   --   HGB 9.8* 9.8* 9.4* 8.3* 9.2*  HCT 29.0* 29.9* 28.2* 25.7* 28.3*  MCV 91.2 94.0 91.3 93.5 94.0  PLT 37* 43* 63* 94* 131*    Cardiac Enzymes: No results for input(s): "CKTOTAL", "CKMB", "CKMBINDEX", "TROPONINI" in the last 168 hours.  BNP: Invalid input(s): "POCBNP"  CBG: Recent Labs  Lab 05/10/24 2003 05/11/24 0004 05/11/24 0345 05/11/24 0747 05/11/24 1210  GLUCAP 314* 222* 173* 173* 284*    Microbiology: Results for orders placed or performed during the hospital encounter of 05/02/24  Culture, blood (Routine x 2)     Status: Abnormal   Collection Time: 05/02/24  9:57 PM   Specimen: BLOOD  Result Value Ref Range Status   Specimen Description   Final    BLOOD BLOOD RIGHT ARM Performed at [facility], [address], [place], [place] [address]    Special Requests   Final    BOTTLES DRAWN AEROBIC AND ANAEROBIC Blood Culture adequate volume Performed at [facility], [address], [place], [place] [address]    Culture  Setup Time   Final    GRAM NEGATIVE RODS IN BOTH AEROBIC AND ANAEROBIC BOTTLES CRITICAL VALUE NOTED.  VALUE IS CONSISTENT WITH PREVIOUSLY REPORTED AND CALLED VALUE. Performed at [facility], [address] [place], [place] [address]    Culture (A)  Final    ESCHERICHIA  COLI SUSCEPTIBILITIES PERFORMED ON PREVIOUS CULTURE WITHIN THE LAST 5 DAYS. Performed at [facility], [address], [place], [place] [address]    Report Status 05/05/2024 FINAL  Final  Culture, blood (Routine x 2)     Status: Abnormal   Collection Time: 05/02/24  9:58 PM   Specimen: BLOOD  Result Value Ref Range Status   Specimen Description   Final    BLOOD BLOOD RIGHT ARM Performed at [facility], [address], [place], [place] [address]    Special Requests   Final    BOTTLES DRAWN AEROBIC AND ANAEROBIC Blood Culture adequate volume Performed at [facility], [address] [place], [place]  [address]    Culture  Setup Time   Final    GRAM NEGATIVE RODS IN BOTH AEROBIC AND ANAEROBIC BOTTLES CRITICAL RESULT CALLED TO, READ BACK BY AND VERIFIED WITH: SHEEMA HALLAJI PHARMD 1020 05/03/24 HNM    Culture ESCHERICHIA COLI (A)  Final   Report Status 05/05/2024 FINAL  Final   Organism ID, Bacteria ESCHERICHIA COLI  Final   Organism ID, Bacteria ESCHERICHIA COLI  Final      Susceptibility   Escherichia coli - KIRBY BAUER*    CEFAZOLIN SENSITIVE Sensitive    Escherichia coli - MIC*    AMPICILLIN 4 SENSITIVE Sensitive     CEFEPIME <=0.12 SENSITIVE Sensitive     CEFTAZIDIME <=1 SENSITIVE Sensitive     CEFTRIAXONE <=0.25 SENSITIVE Sensitive     CIPROFLOXACIN <=0.25 SENSITIVE Sensitive     GENTAMICIN <=1 SENSITIVE Sensitive     IMIPENEM <=0.25 SENSITIVE Sensitive     TRIMETH/SULFA <=20 SENSITIVE Sensitive     AMPICILLIN/SULBACTAM <=2 SENSITIVE Sensitive     PIP/TAZO <=4 SENSITIVE Sensitive ug/mL    * ESCHERICHIA COLI    ESCHERICHIA COLI  Blood Culture ID Panel (Reflexed)     Status: Abnormal   Collection Time: 05/02/24  9:58 PM  Result Value Ref Range Status   Enterococcus faecalis NOT DETECTED NOT DETECTED Final   Enterococcus Faecium NOT DETECTED NOT DETECTED Final   Listeria monocytogenes NOT DETECTED NOT DETECTED Final   Staphylococcus species NOT DETECTED NOT DETECTED Final   Staphylococcus aureus (BCID) NOT DETECTED NOT DETECTED Final   Staphylococcus epidermidis NOT DETECTED NOT DETECTED Final   Staphylococcus lugdunensis NOT DETECTED NOT DETECTED Final   Streptococcus species NOT DETECTED NOT DETECTED Final   Streptococcus agalactiae NOT DETECTED NOT DETECTED Final   Streptococcus pneumoniae NOT DETECTED NOT DETECTED Final   Streptococcus pyogenes NOT DETECTED NOT DETECTED Final   A.calcoaceticus-baumannii NOT DETECTED NOT DETECTED Final   Bacteroides fragilis NOT DETECTED NOT DETECTED Final   Enterobacterales DETECTED (A) NOT DETECTED Final    Comment: Enterobacterales  represent a large order of gram negative bacteria, not a single organism. CRITICAL RESULT CALLED TO, READ BACK BY AND VERIFIED WITH: SHEEMA HALLAJI PHARMD 1020 05/03/24 HNM    Enterobacter cloacae complex NOT DETECTED NOT DETECTED Final   Escherichia coli DETECTED (A) NOT DETECTED Final    Comment: CRITICAL RESULT CALLED TO, READ BACK BY AND VERIFIED WITH: SHEEMA HALLAJI PHARMD 1020 05/03/24 HNM    Klebsiella aerogenes NOT DETECTED NOT DETECTED Final   Klebsiella oxytoca NOT DETECTED NOT DETECTED Final   Klebsiella pneumoniae NOT DETECTED NOT DETECTED Final   Proteus species NOT DETECTED NOT DETECTED Final   Salmonella species NOT DETECTED NOT DETECTED Final   Serratia marcescens NOT DETECTED NOT DETECTED Final   Haemophilus influenzae NOT DETECTED NOT DETECTED  Final   Neisseria meningitidis NOT DETECTED NOT DETECTED Final   Pseudomonas aeruginosa NOT DETECTED NOT DETECTED Final   Stenotrophomonas maltophilia NOT DETECTED NOT DETECTED Final   Candida albicans NOT DETECTED NOT DETECTED Final   Candida auris NOT DETECTED NOT DETECTED Final   Candida glabrata NOT DETECTED NOT DETECTED Final   Candida krusei NOT DETECTED NOT DETECTED Final   Candida parapsilosis NOT DETECTED NOT DETECTED Final   Candida tropicalis NOT DETECTED NOT DETECTED Final   Cryptococcus neoformans/gattii NOT DETECTED NOT DETECTED Final   CTX-M ESBL NOT DETECTED NOT DETECTED Final   Carbapenem resistance IMP NOT DETECTED NOT DETECTED Final   Carbapenem resistance KPC NOT DETECTED NOT DETECTED Final   Carbapenem resistance NDM NOT DETECTED NOT DETECTED Final   Carbapenem resist OXA 48 LIKE NOT DETECTED NOT DETECTED Final   Carbapenem resistance VIM NOT DETECTED NOT DETECTED Final    Comment: Performed at [facility], [address] [place], [place] [address]  Resp panel by RT-PCR (RSV, Flu A&B, Covid) Anterior Nasal Swab     Status: None   Collection Time: 05/02/24 11:17 PM   Specimen: Anterior Nasal  Swab  Result Value Ref Range Status   SARS Coronavirus 2 by RT PCR NEGATIVE NEGATIVE Final    Comment: (NOTE) SARS-CoV-2 target nucleic acids are NOT DETECTED.  The SARS-CoV-2 RNA is generally detectable in upper respiratory specimens during the acute phase of infection. The lowest concentration of SARS-CoV-2 viral copies this assay can detect is 138 copies/mL. A negative result does not preclude SARS-Cov-2 infection and should not be used as the sole basis for treatment or other patient management decisions. A negative result may occur with  improper specimen collection/handling, submission of specimen other than nasopharyngeal swab, presence of viral mutation(s) within the areas targeted by this assay, and inadequate number of viral copies(<138 copies/mL). A negative result must be combined with clinical observations, patient history, and epidemiological information. The expected result is Negative.  Fact Sheet for Patients:  BloggerCourse.com  Fact Sheet for Healthcare Providers:  SeriousBroker.it  This test is no t yet approved or cleared by the [place]  FDA and  has been authorized for detection and/or diagnosis of SARS-CoV-2 by FDA under an Emergency Use Authorization (EUA). This EUA will remain  in effect (meaning this test can be used) for the duration of the COVID-19 declaration under Section 564(b)(1) of the Act, 21 U.S.C.section 360bbb-3(b)(1), unless the authorization is terminated  or revoked sooner.       Influenza A by PCR NEGATIVE NEGATIVE Final   Influenza B by PCR NEGATIVE NEGATIVE Final    Comment: (NOTE) The Xpert Xpress SARS-CoV-2/FLU/RSV plus assay is intended as an aid in the diagnosis of influenza from Nasopharyngeal swab specimens and should not be used as a sole basis for treatment. Nasal washings and aspirates are unacceptable for Xpert Xpress SARS-CoV-2/FLU/RSV testing.  Fact Sheet for  Patients: BloggerCourse.com  Fact Sheet for Healthcare Providers: SeriousBroker.it  This test is not yet approved or cleared by the [place]  FDA and has been authorized for detection and/or diagnosis of SARS-CoV-2 by FDA under an Emergency Use Authorization (EUA). This EUA will remain in effect (meaning this test can be used) for the duration of the COVID-19 declaration under Section 564(b)(1) of the Act, 21 U.S.C. section 360bbb-3(b)(1), unless the authorization is terminated or revoked.     Resp Syncytial Virus by PCR NEGATIVE NEGATIVE Final    Comment: (NOTE) Fact Sheet for Patients: BloggerCourse.com  Fact  Sheet for Healthcare Providers: SeriousBroker.it  This test is not yet approved or cleared by the [place]  FDA and has been authorized for detection and/or diagnosis of SARS-CoV-2 by FDA under an Emergency Use Authorization (EUA). This EUA will remain in effect (meaning this test can be used) for the duration of the COVID-19 declaration under Section 564(b)(1) of the Act, 21 U.S.C. section 360bbb-3(b)(1), unless the authorization is terminated or revoked.  Performed at [facility], [address] [place], [place] [address]   Urine Culture     Status: Abnormal   Collection Time: 05/03/24 12:18 AM   Specimen: Urine, Random  Result Value Ref Range Status   Specimen Description   Final    URINE, RANDOM Performed at [facility], [address] [place], [place] [address]    Special Requests   Final    NONE Reflexed from [id number] Performed at [facility], [address] [place], [place] [address]    Culture >=100,000 COLONIES/mL ESCHERICHIA COLI (A)  Final   Report Status 05/05/2024 FINAL  Final   Organism ID, Bacteria ESCHERICHIA COLI (A)  Final      Susceptibility   Escherichia coli - MIC*    AMPICILLIN 4 SENSITIVE Sensitive      CEFAZOLIN <=4 SENSITIVE Sensitive     CEFEPIME <=0.12 SENSITIVE Sensitive     CEFTRIAXONE <=0.25 SENSITIVE Sensitive     CIPROFLOXACIN <=0.25 SENSITIVE Sensitive     GENTAMICIN <=1 SENSITIVE Sensitive     IMIPENEM <=0.25 SENSITIVE Sensitive     NITROFURANTOIN <=16 SENSITIVE Sensitive     TRIMETH/SULFA <=20 SENSITIVE Sensitive     AMPICILLIN/SULBACTAM <=2 SENSITIVE Sensitive     PIP/TAZO <=4 SENSITIVE Sensitive ug/mL    * >=100,000 COLONIES/mL ESCHERICHIA COLI  MRSA Next Gen by PCR, Nasal     Status: Abnormal   Collection Time: 05/03/24  4:00 AM   Specimen: Nasal Mucosa; Nasal Swab  Result Value Ref Range Status   MRSA by PCR Next Gen DETECTED (A) NOT DETECTED Final    Comment: RESULT CALLED TO, READ BACK BY AND VERIFIED WITH: Salina Craver RN [phone] 05/03/24 HNM (NOTE) The GeneXpert MRSA Assay (FDA approved for NASAL specimens only), is one component of a comprehensive MRSA colonization surveillance program. It is not intended to diagnose MRSA infection nor to guide or monitor treatment for MRSA infections. Test performance is not FDA approved in patients less than 51 years old. Performed at [facility], [address] [place], [place] [address]   Culture, blood (Routine X 2) w Reflex to ID Panel     Status: None   Collection Time: 05/05/24 12:17 AM   Specimen: BLOOD RIGHT ARM  Result Value Ref Range Status   Specimen Description BLOOD RIGHT ARM  Final   Special Requests   Final    BOTTLES DRAWN AEROBIC AND ANAEROBIC Blood Culture adequate volume   Culture   Final    NO GROWTH 5 DAYS Performed at [facility], [address] [place], [place] [address]    Report Status 05/10/2024 FINAL  Final  Culture, blood (Routine X 2) w Reflex to ID Panel     Status: None   Collection Time: 05/05/24 12:17 AM   Specimen: BLOOD LEFT HAND  Result Value Ref Range Status   Specimen Description BLOOD LEFT HAND  Final   Special Requests   Final    BOTTLES DRAWN AEROBIC  AND ANAEROBIC Blood Culture adequate volume   Culture  Final    NO GROWTH 5 DAYS Performed at [facility], [address] [place], [place] [address]    Report Status 05/10/2024 FINAL  Final  Culture, blood (Routine X 2) w Reflex to ID Panel     Status: None (Preliminary result)   Collection Time: 05/08/24  6:47 AM   Specimen: BLOOD  Result Value Ref Range Status   Specimen Description BLOOD BLOOD LEFT HAND  Final   Special Requests   Final    BOTTLES DRAWN AEROBIC AND ANAEROBIC Blood Culture adequate volume   Culture   Final    NO GROWTH 3 DAYS Performed at [facility], [address] [place], [place] [address]    Report Status PENDING  Incomplete  Culture, blood (Routine X 2) w Reflex to ID Panel     Status: None (Preliminary result)   Collection Time: 05/08/24  6:47 AM   Specimen: BLOOD  Result Value Ref Range Status   Specimen Description BLOOD BLOOD LEFT ARM  Final   Special Requests   Final    BOTTLES DRAWN AEROBIC AND ANAEROBIC Blood Culture adequate volume   Culture   Final    NO GROWTH 3 DAYS Performed at [facility], [address], [place], [place] [address]    Report Status PENDING  Incomplete    Coagulation Studies: No results for input(s): "LABPROT", "INR" in the last 72 hours.   Urinalysis: No results for input(s): "COLORURINE", "LABSPEC", "PHURINE", "GLUCOSEU", "HGBUR", "BILIRUBINUR", "KETONESUR", "PROTEINUR", "UROBILINOGEN", "NITRITE", "LEUKOCYTESUR" in the last 72 hours.  Invalid input(s): "APPERANCEUR"    Imaging: No results found.    Medications:      amoxicillin   1,000 mg Oral Q8H   brimonidine  1 drop Right Eye TID   brinzolamide  1 drop Right Eye TID   Chlorhexidine Gluconate Cloth  6 each Topical Daily   insulin  aspart  0-9 Units Subcutaneous Q4H   insulin  aspart  4 Units Subcutaneous TID WC   insulin  glargine-yfgn  10 Units Subcutaneous Daily   metoprolol tartrate  25 mg Oral BID   mycophenolate   360 mg Oral BID   pantoprazole  40 mg Oral BID   sodium bicarbonate  650 mg Oral TID   tacrolimus   1 mg Oral Q1200   tacrolimus   2 mg Oral Q1200   tamsulosin  0.4 mg Oral QPC supper   acetaminophen, diphenhydrAMINE, docusate sodium, fentaNYL (SUBLIMAZE) injection, guaiFENesin-dextromethorphan, ondansetron (ZOFRAN) IV, mouth rinse, polyethylene glycol, traZODone  Assessment/ Plan:  Mr. ETHER WOLTERS is a 68 y.o.  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  Patient currently follows with [facility] nephrology, Dr. Helga Loan.  Baseline creatinine  1.4-1.6.  Creatinine peaked at 3.9. Acute kidney injury likely secondary to concurrent illness, septic shock and hypovolemia.   - Continue home dosing of mycophenolate and Tacrolimus   - acute kidney injury complicated by acute metabolic acidosis: corrected with oral sodium bicarbonate -  Weaned hydrocortisone, prednisone free outpatient.  - Patient will need  follow up with [facility] nephrology at discharge.    Lab Results  Component Value Date   CREATININE 2.11 (H) 05/11/2024   CREATININE 2.26 (H) 05/10/2024   CREATININE 2.39 (H) 05/09/2024        Intake/Output Summary (Last 24 hours) at 05/11/2024 1232 Last data filed at 05/11/2024 0403 Gross per 24 hour  Intake 180 ml  Output 950 ml  Net -770 ml      2.  Sepsis secondary to E. coli bacteremia UTI.   Pansensitive  - Oral amoxicillin   - Appreciate ID input   3. Anemia of chronic kidney disease Hemoglobin & Hematocrit     Component Value Date/Time   HGB 9.2 (L) 05/11/2024 0433   HGB 9.9 (L) 04/03/2015 0836   HCT 28.3 (L) 05/11/2024 0433   HCT 30.6 (L) 04/03/2015 0836  Will continue to monitor. Can evaluate need for ESA outpatient.    4. Secondary Hyperparathyroidism:    Lab Results  Component Value Date   CALCIUM 9.0 05/11/2024   PHOS 3.7 05/11/2024    Bone minerals within defined limits.    LOS: 8 Addam Goeller 5/29/202512:32 PM

## 2024-05-11 NOTE — Discharge Summary (Signed)
 [facility] Discharge Summary   Patient: Rick Wang:[id number]  PCP: Nikki Barters, MD  Date of admission: 05/02/2024   Date of discharge:  05/11/2024     Discharge Diagnoses:  Principal Problem:   Severe sepsis [facility]) Active Problems:   E coli bacteremia   AKI (acute kidney injury) (HCC)   Complicated UTI (urinary tract infection)   Admitted From: Home Disposition:  Home with out pt PT  Recommendations for Outpatient Follow-up:  F/u with PCP in 1 wk F/u with Nephro in 1 wk and when to resume lisinopril Follow up LABS/TEST:  Repeat CBC and Bmp in 1 wk   Diet recommendation: Renal diet  Activity: The patient is advised to gradually reintroduce usual activities, as tolerated  Discharge Condition: stable  Code Status: Full code   History of present illness: As per the H and P dictated on admission Hospital Course:  Rick Wang is 68 year old with h/o HTN, HLD, Diabetes type 1, Renal transplant 2012, CKD, DVT, Eosinophilic esophagitis with dysphagia that presented 5/20 to the ER with complaints of Fever and syncope. To review, patient received Hep B vaccine Monday, on Tuesday developed Malaise, poor appetite, fever, nausea, confusion. Wife states that he was standing and his legs gave out, she assisted him to the ground. EMS was called, upon arrival to the ER BP 113/92, HR 114, Temp 102.6. Labs revealed chronic Leukopenia 1.5, hgb 10.3, Plts 112, BUN 43, creat 2.07, Lactic acid 5.2, PT 17, INR 1.4, Beta hydroxybutyrate negative, RVP negative. UA+, culture pending, BC x 2, and he was started on Cefepime  and Vancomycin . He was given 30 ml/kg IVF bolus. With ongoing hypotension he was given and additional 1 liter (3 Liters total) and started on Levophed  infusion. He is now admitted to the ICU for further management of his septic shock.    5/21: Admitted to ICU with septic shock due to UTI. BC x 2, RVP negative, Urine culture pending, Cefepime  + Vancomycin . S/p 3 Liters  IVF. Starting insulin  drip and discontinuing patients pump.  05/04/24-patient had insomnia overnight, on 2L/min [place].  S/p ID eval.  Remains oliguric and has been seen by nephrology. Has dysphagia and is for SLP.  Remains off vasopressors.  Transferred to [facility] on 5/23 and discharged on 5/29   Assessment and Plan:  # Septic Shock secondary to UTI # Immunocompromised, chronic # Lactic Acidosis and Hypotension: Resolved  s/p  Cefepime  + Vancomycin , s/p 3 liters IVFB, and  IVF @ 150 ml/hr, s/p Levophed  infusion due to Persistent hypotension stress dose steroids started in the ICU, s/p finished tapering dose.  5/21 Urine culture growing E. coli pansensitive 5/20 blood culture growing E. Coli, pansensitive 5/21 s/p ceftriaxone  2 g IV daily 5/23 repeat blood culture NGTD 5/26 repeat blood cultures NGTD  CT a/p: No acute inflammatory process identified within the abdomen or pelvis. ID consulted, started Augmentin  twice daily for total of 14 days, end date 6/3. WBC count trending down.  Follow with PCP to repeat CBC after 1 week.   # Thrombocytopenia, HIT screen negative T4 score 6, highly suspect. Pharmacist consulted, s/p Argatroban  IV gtt, d/c'd on 5/27. As per ID ruled out DIC. Fibrinogen  level 353 within normal range, peripheral smear morphology normal, no schistocytes. INR 3.1, PTT 31.9 elevated could be due to argatroban  Platelet 37>>> 131K gradually improving, repeat CBC next week.  Follow with PCP   # Acute hypoxic respiratory failure: Resolved  secondary to pulmonary edema and pleural effusion secondary  to fluid resuscitation.  CT Chest: small left and small-to-moderate right pleural effusion W/ compressive atelectatic changes in the bilateral lower lobes. No lung mass, consolidation or pneumothorax. No suspicious lung nodule.  5/25 supplemental O2 admission has been weaned off, saturating well on room air 5/26 CXR: Pleural effusion almost resolved.   # Acute on CKD stage 3a # Renal  Transplant 2012 @ [facility] Baseline serum creatinine 1.47, eGFR 53, CKD 3a 3 months ago Nephro reached out to Transplant team, and resumed tacrolimus  and mycophenolate. s/p Fluid resuscitation  Renal US : Unremarkable right lower quadrant renal transplant.  5/23 Nephro restarted tacrolimus  at half dose 1 mg in a.m. and 0.5 mg in p.m.  5/24 restarted Myfortic. Spoke with Dr Helga Loan, who agrees with treatment plan.  5/27 resumed by Nephro: Tacrolimus  2 mg in the a.m. and 1 mg in the p.m. sCr 2.11 improving.    # Metabolic acidosis secondary to AKI/CKD 5/24 CO2 18, bicarb 100 mEq IV one-time dose given followed by oral supplement. 5/27 bicarb 19, bicarbonate IV 100 mEq given.  Followed by oral supplement. On 5/29 bicarb 25, metabolic acidosis resolved.  Repeat BMP after 1 week, follow-up with PCP and nephro   # Urinary retention, Foley catheter was inserted on 5/23, around 400 mL urine was collected immediately after Foley catheter insertion. Started Flomax Foley catheter was discontinued on 5/28, patient voiding well.  No retention.   # Diabetes Type 1, utilizes insulin  pump Removed Insulin  pump and Endotool was started in the ICU S/p Semglee 8-10 units daily, s/p NovoLog  3 units 3 times daily and sliding scale.  Patient was advised to continue insulin  pump at home, monitor CBG, Continue diabetic diet and follow-up with PCP for further management.  # Hypertension and Hyperlipidemia Antihypertensive medications were held due to sepsis and hypotension.held statin as well.  On 5/23 BP improved, patient is hypertensive. Patient was on amlodipine 5 mg and lisinopril 40 mg at home. Started Lopressor 25 mg p.o. BID.  Discontinued amlodipine due to interaction with tacrolimus .  Lisinopril can be resumed by nephrology as an outpatient when needed.   # Anemia due to CKD, Hb 9.2 today, stable   # Eosinophilic Esophagitis w/ Dysphagia Patient is s/p recent EGD with biopsy's - Continue home PPI -SLP evaluation  done, no oropharyngeal dysphagia.  Body mass index is 24.99 kg/m.  Nutrition Interventions:  - Patient was instructed, not to drive, operate heavy machinery, perform activities at heights, swimming or participation in water  activities or provide baby sitting services while on Pain, Sleep and Anxiety Medications; until his outpatient Physician has advised to do so again.  - Also recommended to not to take more than prescribed Pain, Sleep and Anxiety Medications.  Patient was seen by physical therapy, who recommended outpatient PT, which was arranged. On the day of the discharge the patient's vitals were stable, and no other acute medical condition were reported by patient. the patient was felt safe to be discharge at Home with patient PT.  Consultants: PCCM, nephrology, ID Procedures: None  Discharge Exam: General: Appear in no distress, no Rash; Oral Mucosa Clear, moist. Cardiovascular: S1 and S2 Present, no Murmur, Respiratory: normal respiratory effort, Bilateral Air entry present and no Crackles, no wheezes Abdomen: Bowel Sound present, Soft and no tenderness, no hernia Extremities: no Pedal edema, no calf tenderness Neurology: alert and oriented to time, place, and person affect appropriate.  Filed Weights   05/07/24 0500 05/08/24 0403 05/09/24 0428  Weight: 75.8 kg 74.8 kg 79  kg   Vitals:   05/11/24 0539 05/11/24 0746  BP: 138/67 (!) 151/72  Pulse: 72 76  Resp: 18 17  Temp: 97.6 F (36.4 C) 97.6 F (36.4 C)  SpO2: 97% 96%    DISCHARGE MEDICATION: Allergies as of 05/11/2024       Reactions   Heparin    Possible HIT        Medication List     PAUSE taking these medications    lisinopril 40 MG tablet Wait to take this until your doctor or other care provider tells you to start again. Commonly known as: ZESTRIL Take 40 mg by mouth.       STOP taking these medications    amLODipine 5 MG tablet Commonly known as: NORVASC       TAKE these medications     amoxicillin  500 MG capsule Commonly known as: AMOXIL  Take 2 capsules (1,000 mg total) by mouth every 8 (eight) hours for 6 days.   aspirin 81 MG tablet Take by mouth.   atorvastatin 20 MG tablet Commonly known as: LIPITOR Take 20 mg by mouth every evening.   BD Insulin  Syringe Ultrafine 31G X 5/16" 0.5 ML Misc Generic drug: Insulin  Syringe-Needle U-100   BD Pen Needle Nano U/F 32G X 4 MM Misc Generic drug: Insulin  Pen Needle USE 1 EACH ONCE DAILY   cholecalciferol 25 MCG (1000 UNIT) tablet Commonly known as: VITAMIN D3 Take 2,000 Units by mouth daily.   cinacalcet 60 MG tablet Commonly known as: SENSIPAR Take 60 mg by mouth daily.   Colace 100 MG capsule Generic drug: docusate sodium Take 100 mg by mouth daily as needed for mild constipation.   Dextromethorphan-guaiFENesin 20-200 MG/20ML Liqd Take 20 mLs by mouth every 6 (six) hours as needed.   ferrous sulfate 325 (65 FE) MG tablet Take 325 mg by mouth daily with breakfast.   insulin  aspart 100 UNIT/ML injection Commonly known as: novoLOG  Inject 0-60 Units into the skin daily.   metoprolol tartrate 25 MG tablet Commonly known as: LOPRESSOR Take 1 tablet (25 mg total) by mouth 2 (two) times daily.   mupirocin ointment 2 % Commonly known as: BACTROBAN Apply 1 Application topically 2 (two) times daily.   mycophenolate 360 MG Tbec EC tablet Commonly known as: MYFORTIC Take 360 mg by mouth 2 (two) times daily.   omeprazole 20 MG capsule Commonly known as: PRILOSEC Take 20 mg by mouth daily as needed.   Omnipod 5 DexG7G6 Intro Gen 5 Kit Inject into the skin.   OneTouch Delica Lancets Fine Misc   Prograf  1 MG capsule Generic drug: tacrolimus  Take 1-2 mg by mouth 2 (two) times daily.   Simbrinza 1-0.2 % Susp Generic drug: Brinzolamide-Brimonidine Place 1 drop into the right eye 2 (two) times daily.   tamsulosin 0.4 MG Caps capsule Commonly known as: FLOMAX Take 1 capsule (0.4 mg total) by mouth  daily after supper.       Allergies  Allergen Reactions   Heparin     Possible HIT   Discharge Instructions     Call MD for:  difficulty breathing, headache or visual disturbances   Complete by: As directed    Call MD for:  extreme fatigue   Complete by: As directed    Call MD for:  persistant dizziness or light-headedness   Complete by: As directed    Call MD for:  persistant nausea and vomiting   Complete by: As directed    Call MD for:  severe uncontrolled pain   Complete by: As directed    Call MD for:  temperature >100.4   Complete by: As directed    Diet - low sodium heart healthy   Complete by: As directed    Discharge instructions   Complete by: As directed    F/u with PCP in 1 wk F/u with Nephro in 1 wk Repeat CBC and Bmp in 1 wk   Increase activity slowly   Complete by: As directed        The results of significant diagnostics from this hospitalization (including imaging, microbiology, ancillary and laboratory) are listed below for reference.    Significant Diagnostic Studies: DG Chest Port 1 View Result Date: 05/08/2024 CLINICAL DATA:  Sepsis. EXAM: PORTABLE CHEST 1 VIEW COMPARISON:  05/04/2024 FINDINGS: The cardiopericardial silhouette is within normal limits for size. Interval improvement in aeration the right base with decreasing right pleural effusion. Left lung clear. No acute bony abnormality. Telemetry leads overlie the chest. IMPRESSION: Interval improvement in aeration at the right base with decreasing right pleural effusion. Electronically Signed   By: Donnal Fusi M.D.   On: 05/08/2024 09:17   CT ABDOMEN PELVIS WO CONTRAST Result Date: 05/05/2024 CLINICAL DATA:  Abdominal pain, acute, nonlocalized; Pleural effusion, malignancy suspected. EXAM: CT CHEST, ABDOMEN AND PELVIS WITHOUT CONTRAST TECHNIQUE: Multidetector CT imaging of the chest, abdomen and pelvis was performed following the standard protocol without IV contrast. RADIATION DOSE REDUCTION:  This exam was performed according to the departmental dose-optimization program which includes automated exposure control, adjustment of the mA and/or kV according to patient size and/or use of iterative reconstruction technique. COMPARISON:  None Available. FINDINGS: CT CHEST FINDINGS Cardiovascular: Normal cardiac size. no pericardial effusion. No aortic aneurysm. There are coronary artery calcifications, in keeping with coronary artery disease. Mediastinum/Nodes: Visualized thyroid  gland appears grossly unremarkable. No solid / cystic mediastinal masses. The esophagus is nondistended precluding optimal assessment. No mediastinal or axillary lymphadenopathy by size criteria. Evaluation of bilateral hila is limited due to lack on intravenous contrast: however, no large hilar lymphadenopathy identified. Lungs/Pleura: The central tracheo-bronchial tree is patent. There is small left and small-to-moderate right pleural effusion with associated compressive atelectatic changes in the bilateral lower lobes. There are additional patchy areas of linear, plate-like atelectasis and/or scarring throughout bilateral lungs. No mass, consolidation or pneumothorax. No suspicious lung nodules. There multiple sub 4 mm calcified granulomas scattered throughout bilateral lungs. Musculoskeletal: The visualized soft tissues of the chest wall are grossly unremarkable. No suspicious osseous lesions. There are mild multilevel degenerative changes in the visualized spine. CT ABDOMEN PELVIS FINDINGS Hepatobiliary: The liver is normal in size. Non-cirrhotic configuration. No suspicious mass. No intrahepatic or extrahepatic bile duct dilation. No calcified gallstones. Normal gallbladder wall thickness. No pericholecystic inflammatory changes. Pancreas: Unremarkable. No pancreatic ductal dilatation or surrounding inflammatory changes. Spleen: Within normal limits. No focal lesion. Adrenals/Urinary Tract: Adrenal glands are unremarkable.  Markedly small/atrophic bilateral kidneys noted with extensive vascular calcifications. No nephroureterolithiasis or obstructive uropathy. There is implanted right pelvic kidney noted. No nephroureterolithiasis or obstructive uropathy. Unremarkable urinary bladder. Stomach/Bowel: There is a small diverticulum arising from the second part of duodenum. No disproportionate dilation of the small or large bowel loops. No evidence of abnormal bowel wall thickening or inflammatory changes. The appendix is unremarkable. There are multiple diverticula mainly in the left hemi colon, without imaging signs of diverticulitis. Vascular/Lymphatic: No ascites or pneumoperitoneum. No abdominal or pelvic lymphadenopathy, by size criteria. No aneurysmal dilation of the major abdominal  arteries. There are mild peripheral atherosclerotic vascular calcifications of the aorta and its major branches. Reproductive: Normal size prostate. Symmetric seminal vesicles. Other: There is a small fat containing umbilical hernia. There is tiny fat containing right inguinal hernia and small left inguinal hernia containing fat, ascitic fluid and portion of unobstructed sigmoid colon. There is mild anasarca the soft tissues and abdominal wall are otherwise unremarkable. Musculoskeletal: No suspicious osseous lesions. There are mild multilevel degenerative changes in the visualized spine. IMPRESSION: 1. There is small left and small-to-moderate right pleural effusion with associated compressive atelectatic changes in the bilateral lower lobes. No lung mass, consolidation or pneumothorax. No suspicious lung nodule. 2. No acute inflammatory process identified within the abdomen or pelvis. 3. Multiple other nonacute observations, as described above. Aortic Atherosclerosis (ICD10-I70.0). Electronically Signed   By: Beula Brunswick M.D.   On: 05/05/2024 15:01   CT CHEST WO CONTRAST Result Date: 05/05/2024 CLINICAL DATA:  Abdominal pain, acute, nonlocalized;  Pleural effusion, malignancy suspected. EXAM: CT CHEST, ABDOMEN AND PELVIS WITHOUT CONTRAST TECHNIQUE: Multidetector CT imaging of the chest, abdomen and pelvis was performed following the standard protocol without IV contrast. RADIATION DOSE REDUCTION: This exam was performed according to the departmental dose-optimization program which includes automated exposure control, adjustment of the mA and/or kV according to patient size and/or use of iterative reconstruction technique. COMPARISON:  None Available. FINDINGS: CT CHEST FINDINGS Cardiovascular: Normal cardiac size. no pericardial effusion. No aortic aneurysm. There are coronary artery calcifications, in keeping with coronary artery disease. Mediastinum/Nodes: Visualized thyroid  gland appears grossly unremarkable. No solid / cystic mediastinal masses. The esophagus is nondistended precluding optimal assessment. No mediastinal or axillary lymphadenopathy by size criteria. Evaluation of bilateral hila is limited due to lack on intravenous contrast: however, no large hilar lymphadenopathy identified. Lungs/Pleura: The central tracheo-bronchial tree is patent. There is small left and small-to-moderate right pleural effusion with associated compressive atelectatic changes in the bilateral lower lobes. There are additional patchy areas of linear, plate-like atelectasis and/or scarring throughout bilateral lungs. No mass, consolidation or pneumothorax. No suspicious lung nodules. There multiple sub 4 mm calcified granulomas scattered throughout bilateral lungs. Musculoskeletal: The visualized soft tissues of the chest wall are grossly unremarkable. No suspicious osseous lesions. There are mild multilevel degenerative changes in the visualized spine. CT ABDOMEN PELVIS FINDINGS Hepatobiliary: The liver is normal in size. Non-cirrhotic configuration. No suspicious mass. No intrahepatic or extrahepatic bile duct dilation. No calcified gallstones. Normal gallbladder wall  thickness. No pericholecystic inflammatory changes. Pancreas: Unremarkable. No pancreatic ductal dilatation or surrounding inflammatory changes. Spleen: Within normal limits. No focal lesion. Adrenals/Urinary Tract: Adrenal glands are unremarkable. Markedly small/atrophic bilateral kidneys noted with extensive vascular calcifications. No nephroureterolithiasis or obstructive uropathy. There is implanted right pelvic kidney noted. No nephroureterolithiasis or obstructive uropathy. Unremarkable urinary bladder. Stomach/Bowel: There is a small diverticulum arising from the second part of duodenum. No disproportionate dilation of the small or large bowel loops. No evidence of abnormal bowel wall thickening or inflammatory changes. The appendix is unremarkable. There are multiple diverticula mainly in the left hemi colon, without imaging signs of diverticulitis. Vascular/Lymphatic: No ascites or pneumoperitoneum. No abdominal or pelvic lymphadenopathy, by size criteria. No aneurysmal dilation of the major abdominal arteries. There are mild peripheral atherosclerotic vascular calcifications of the aorta and its major branches. Reproductive: Normal size prostate. Symmetric seminal vesicles. Other: There is a small fat containing umbilical hernia. There is tiny fat containing right inguinal hernia and small left inguinal hernia containing fat, ascitic fluid and  portion of unobstructed sigmoid colon. There is mild anasarca the soft tissues and abdominal wall are otherwise unremarkable. Musculoskeletal: No suspicious osseous lesions. There are mild multilevel degenerative changes in the visualized spine. IMPRESSION: 1. There is small left and small-to-moderate right pleural effusion with associated compressive atelectatic changes in the bilateral lower lobes. No lung mass, consolidation or pneumothorax. No suspicious lung nodule. 2. No acute inflammatory process identified within the abdomen or pelvis. 3. Multiple other  nonacute observations, as described above. Aortic Atherosclerosis (ICD10-I70.0). Electronically Signed   By: Beula Brunswick M.D.   On: 05/05/2024 15:01   DG Chest Port 1 View Result Date: 05/04/2024 CLINICAL DATA:  Shortness of breath. EXAM: PORTABLE CHEST 1 VIEW COMPARISON:  May 02, 2024. FINDINGS: The heart size and mediastinal contours are within normal limits. Increased bilateral perihilar and basilar opacities are noted concerning for pulmonary edema with possible small right pleural effusion. The visualized skeletal structures are unremarkable. IMPRESSION: Increased bilateral perihilar and basilar opacities are noted concerning for pulmonary edema with possible small right pleural effusion. Electronically Signed   By: Rosalene Colon M.D.   On: 05/04/2024 14:19   US  Renal Transplant w/Doppler Result Date: 05/03/2024 CLINICAL DATA:  Acute renal insufficiency. EXAM: ULTRASOUND OF RENAL TRANSPLANT WITH RENAL DOPPLER ULTRASOUND TECHNIQUE: Ultrasound examination of the renal transplant was performed with gray-scale, color and duplex doppler evaluation. COMPARISON:  None Available. FINDINGS: Transplant kidney location: RLQ Transplant Kidney: Renal measurements: 10.4 x 6.6 x 5.6 cm = volume: . Normal in size and parenchymal echogenicity. No evidence of mass or hydronephrosis. No peri-transplant fluid collection seen. Color flow in the main renal artery:  Yes Color flow in the main renal vein:  Yes Duplex Doppler Evaluation: Main Renal Artery Velocity: 47 cm/sec Main Renal Artery Resistive Index: 0.8 Venous waveform in main renal vein:  Present Intrarenal resistive index in upper pole:  0.7 (normal 0.6-0.8; equivocal 0.8-0.9; abnormal >= 0.9) Intrarenal resistive index in lower pole: 0.7 (normal 0.6-0.8; equivocal 0.8-0.9; abnormal >= 0.9) Bladder: Normal for degree of bladder distention. Other findings:  Atrophic and echogenic native right kidney. IMPRESSION: Unremarkable right lower quadrant renal  transplant. Electronically Signed   By: Angus Bark M.D.   On: 05/03/2024 12:59   DG Chest Port 1 View Result Date: 05/02/2024 CLINICAL DATA:  Sepsis EXAM: PORTABLE CHEST 1 VIEW COMPARISON:  None Available. FINDINGS: The heart size and mediastinal contours are within normal limits. Both lungs are clear. The visualized skeletal structures are unremarkable. IMPRESSION: No active disease. Electronically Signed   By: Tyron Gallon M.D.   On: 05/02/2024 22:01    Microbiology: Recent Results (from the past 240 hours)  Culture, blood (Routine x 2)     Status: Abnormal   Collection Time: 05/02/24  9:57 PM   Specimen: BLOOD  Result Value Ref Range Status   Specimen Description   Final    BLOOD BLOOD RIGHT ARM Performed at [facility], [address] [place], [place] [address]    Special Requests   Final    BOTTLES DRAWN AEROBIC AND ANAEROBIC Blood Culture adequate volume Performed at [facility], [address], [place], [place] [address]    Culture  Setup Time   Final    GRAM NEGATIVE RODS IN BOTH AEROBIC AND ANAEROBIC BOTTLES CRITICAL VALUE NOTED.  VALUE IS CONSISTENT WITH PREVIOUSLY REPORTED AND CALLED VALUE. Performed at [facility], [address] [place], [place] [address]    Culture (A)  Final  ESCHERICHIA COLI SUSCEPTIBILITIES PERFORMED ON PREVIOUS CULTURE WITHIN THE LAST 5 DAYS. Performed at [facility], [address] [place], [place] [address]    Report Status 05/05/2024 FINAL  Final  Culture, blood (Routine x 2)     Status: Abnormal   Collection Time: 05/02/24  9:58 PM   Specimen: BLOOD  Result Value Ref Range Status   Specimen Description   Final    BLOOD BLOOD RIGHT ARM Performed at [facility], [address], [place], [place] [address]    Special Requests   Final    BOTTLES DRAWN AEROBIC AND ANAEROBIC Blood Culture adequate volume Performed at [facility], [address] [place], [place]  [address]    Culture  Setup Time   Final    GRAM NEGATIVE RODS IN BOTH AEROBIC AND ANAEROBIC BOTTLES CRITICAL RESULT CALLED TO, READ BACK BY AND VERIFIED WITH: SHEEMA HALLAJI PHARMD 1020 05/03/24 [facility]    Culture ESCHERICHIA COLI (A)  Final   Report Status 05/05/2024 FINAL  Final   Organism ID, Bacteria ESCHERICHIA COLI  Final   Organism ID, Bacteria ESCHERICHIA COLI  Final      Susceptibility   Escherichia coli - KIRBY BAUER*    CEFAZOLIN SENSITIVE Sensitive    Escherichia coli - MIC*    AMPICILLIN 4 SENSITIVE Sensitive     CEFEPIME <=0.12 SENSITIVE Sensitive     CEFTAZIDIME <=1 SENSITIVE Sensitive     CEFTRIAXONE <=0.25 SENSITIVE Sensitive     CIPROFLOXACIN <=0.25 SENSITIVE Sensitive     GENTAMICIN <=1 SENSITIVE Sensitive     IMIPENEM <=0.25 SENSITIVE Sensitive     TRIMETH/SULFA <=20 SENSITIVE Sensitive     AMPICILLIN/SULBACTAM <=2 SENSITIVE Sensitive     PIP/TAZO <=4 SENSITIVE Sensitive ug/mL    * ESCHERICHIA COLI    ESCHERICHIA COLI  Blood Culture ID Panel (Reflexed)     Status: Abnormal   Collection Time: 05/02/24  9:58 PM  Result Value Ref Range Status   Enterococcus faecalis NOT DETECTED NOT DETECTED Final   Enterococcus Faecium NOT DETECTED NOT DETECTED Final   Listeria monocytogenes NOT DETECTED NOT DETECTED Final   Staphylococcus species NOT DETECTED NOT DETECTED Final   Staphylococcus aureus (BCID) NOT DETECTED NOT DETECTED Final   Staphylococcus epidermidis NOT DETECTED NOT DETECTED Final   Staphylococcus lugdunensis NOT DETECTED NOT DETECTED Final   Streptococcus species NOT DETECTED NOT DETECTED Final   Streptococcus agalactiae NOT DETECTED NOT DETECTED Final   Streptococcus pneumoniae NOT DETECTED NOT DETECTED Final   Streptococcus pyogenes NOT DETECTED NOT DETECTED Final   A.calcoaceticus-baumannii NOT DETECTED NOT DETECTED Final   Bacteroides fragilis NOT DETECTED NOT DETECTED Final   Enterobacterales DETECTED (A) NOT DETECTED Final    Comment: Enterobacterales  represent a large order of gram negative bacteria, not a single organism. CRITICAL RESULT CALLED TO, READ BACK BY AND VERIFIED WITH: SHEEMA HALLAJI PHARMD 1020 05/03/24 HNM    Enterobacter cloacae complex NOT DETECTED NOT DETECTED Final   Escherichia coli DETECTED (A) NOT DETECTED Final    Comment: CRITICAL RESULT CALLED TO, READ BACK BY AND VERIFIED WITH: SHEEMA HALLAJI PHARMD 1020 05/03/24 HNM    Klebsiella aerogenes NOT DETECTED NOT DETECTED Final   Klebsiella oxytoca NOT DETECTED NOT DETECTED Final   Klebsiella pneumoniae NOT DETECTED NOT DETECTED Final   Proteus species NOT DETECTED NOT DETECTED Final   Salmonella species NOT DETECTED NOT DETECTED Final   Serratia marcescens NOT DETECTED NOT DETECTED Final   Haemophilus influenzae NOT DETECTED NOT  DETECTED Final   Neisseria meningitidis NOT DETECTED NOT DETECTED Final   Pseudomonas aeruginosa NOT DETECTED NOT DETECTED Final   Stenotrophomonas maltophilia NOT DETECTED NOT DETECTED Final   Candida albicans NOT DETECTED NOT DETECTED Final   Candida auris NOT DETECTED NOT DETECTED Final   Candida glabrata NOT DETECTED NOT DETECTED Final   Candida krusei NOT DETECTED NOT DETECTED Final   Candida parapsilosis NOT DETECTED NOT DETECTED Final   Candida tropicalis NOT DETECTED NOT DETECTED Final   Cryptococcus neoformans/gattii NOT DETECTED NOT DETECTED Final   CTX-M ESBL NOT DETECTED NOT DETECTED Final   Carbapenem resistance IMP NOT DETECTED NOT DETECTED Final   Carbapenem resistance KPC NOT DETECTED NOT DETECTED Final   Carbapenem resistance NDM NOT DETECTED NOT DETECTED Final   Carbapenem resist OXA 48 LIKE NOT DETECTED NOT DETECTED Final   Carbapenem resistance VIM NOT DETECTED NOT DETECTED Final    Comment: Performed at [facility], [address] [place], [place] [address]  Resp panel by RT-PCR (RSV, Flu A&B, Covid) Anterior Nasal Swab     Status: None   Collection Time: 05/02/24 11:17 PM   Specimen: Anterior Nasal  Swab  Result Value Ref Range Status   SARS Coronavirus 2 by RT PCR NEGATIVE NEGATIVE Final    Comment: (NOTE) SARS-CoV-2 target nucleic acids are NOT DETECTED.  The SARS-CoV-2 RNA is generally detectable in upper respiratory specimens during the acute phase of infection. The lowest concentration of SARS-CoV-2 viral copies this assay can detect is 138 copies/mL. A negative result does not preclude SARS-Cov-2 infection and should not be used as the sole basis for treatment or other patient management decisions. A negative result may occur with  improper specimen collection/handling, submission of specimen other than nasopharyngeal swab, presence of viral mutation(s) within the areas targeted by this assay, and inadequate number of viral copies(<138 copies/mL). A negative result must be combined with clinical observations, patient history, and epidemiological information. The expected result is Negative.  Fact Sheet for Patients:  BloggerCourse.com  Fact Sheet for Healthcare Providers:  SeriousBroker.it  This test is no t yet approved or cleared by the [place]  FDA and  has been authorized for detection and/or diagnosis of SARS-CoV-2 by FDA under an Emergency Use Authorization (EUA). This EUA will remain  in effect (meaning this test can be used) for the duration of the COVID-19 declaration under Section 564(b)(1) of the Act, 21 U.S.C.section 360bbb-3(b)(1), unless the authorization is terminated  or revoked sooner.       Influenza A by PCR NEGATIVE NEGATIVE Final   Influenza B by PCR NEGATIVE NEGATIVE Final    Comment: (NOTE) The Xpert Xpress SARS-CoV-2/FLU/RSV plus assay is intended as an aid in the diagnosis of influenza from Nasopharyngeal swab specimens and should not be used as a sole basis for treatment. Nasal washings and aspirates are unacceptable for Xpert Xpress SARS-CoV-2/FLU/RSV testing.  Fact Sheet for  Patients: BloggerCourse.com  Fact Sheet for Healthcare Providers: SeriousBroker.it  This test is not yet approved or cleared by the [place]  FDA and has been authorized for detection and/or diagnosis of SARS-CoV-2 by FDA under an Emergency Use Authorization (EUA). This EUA will remain in effect (meaning this test can be used) for the duration of the COVID-19 declaration under Section 564(b)(1) of the Act, 21 U.S.C. section 360bbb-3(b)(1), unless the authorization is terminated or revoked.     Resp Syncytial Virus by PCR NEGATIVE NEGATIVE Final    Comment: (NOTE) Fact Sheet for Patients: BloggerCourse.com  Fact Sheet for Healthcare Providers: SeriousBroker.it  This test is not yet approved or cleared by the [place]  FDA and has been authorized for detection and/or diagnosis of SARS-CoV-2 by FDA under an Emergency Use Authorization (EUA). This EUA will remain in effect (meaning this test can be used) for the duration of the COVID-19 declaration under Section 564(b)(1) of the Act, 21 U.S.C. section 360bbb-3(b)(1), unless the authorization is terminated or revoked.  Performed at [facility], [address] [place], [place] [address]   Urine Culture     Status: Abnormal   Collection Time: 05/03/24 12:18 AM   Specimen: Urine, Random  Result Value Ref Range Status   Specimen Description   Final    URINE, RANDOM Performed at [facility], [address] [place], [place] [address]    Special Requests   Final    NONE Reflexed from [id number] Performed at [facility], [address] [place], [place] [address]    Culture >=100,000 COLONIES/mL ESCHERICHIA COLI (A)  Final   Report Status 05/05/2024 FINAL  Final   Organism ID, Bacteria ESCHERICHIA COLI (A)  Final      Susceptibility   Escherichia coli - MIC*    AMPICILLIN 4 SENSITIVE Sensitive      CEFAZOLIN <=4 SENSITIVE Sensitive     CEFEPIME <=0.12 SENSITIVE Sensitive     CEFTRIAXONE <=0.25 SENSITIVE Sensitive     CIPROFLOXACIN <=0.25 SENSITIVE Sensitive     GENTAMICIN <=1 SENSITIVE Sensitive     IMIPENEM <=0.25 SENSITIVE Sensitive     NITROFURANTOIN <=16 SENSITIVE Sensitive     TRIMETH/SULFA <=20 SENSITIVE Sensitive     AMPICILLIN/SULBACTAM <=2 SENSITIVE Sensitive     PIP/TAZO <=4 SENSITIVE Sensitive ug/mL    * >=100,000 COLONIES/mL ESCHERICHIA COLI  MRSA Next Gen by PCR, Nasal     Status: Abnormal   Collection Time: 05/03/24  4:00 AM   Specimen: Nasal Mucosa; Nasal Swab  Result Value Ref Range Status   MRSA by PCR Next Gen DETECTED (A) NOT DETECTED Final    Comment: RESULT CALLED TO, READ BACK BY AND VERIFIED WITH: Salina Craver RN [phone] 05/03/24 HNM (NOTE) The GeneXpert MRSA Assay (FDA approved for NASAL specimens only), is one component of a comprehensive MRSA colonization surveillance program. It is not intended to diagnose MRSA infection nor to guide or monitor treatment for MRSA infections. Test performance is not FDA approved in patients less than 80 years old. Performed at [facility], [address] [place], [place] [address]   Culture, blood (Routine X 2) w Reflex to ID Panel     Status: None   Collection Time: 05/05/24 12:17 AM   Specimen: BLOOD RIGHT ARM  Result Value Ref Range Status   Specimen Description BLOOD RIGHT ARM  Final   Special Requests   Final    BOTTLES DRAWN AEROBIC AND ANAEROBIC Blood Culture adequate volume   Culture   Final    NO GROWTH 5 DAYS Performed at [facility], [address], [place], [place] [address]    Report Status 05/10/2024 FINAL  Final  Culture, blood (Routine X 2) w Reflex to ID Panel     Status: None   Collection Time: 05/05/24 12:17 AM   Specimen: BLOOD LEFT HAND  Result Value Ref Range Status   Specimen Description BLOOD LEFT HAND  Final   Special Requests   Final    BOTTLES DRAWN AEROBIC  AND ANAEROBIC Blood Culture adequate volume   Culture  Final    NO GROWTH 5 DAYS Performed at [facility], [address] [place], [place] [address]    Report Status 05/10/2024 FINAL  Final  Culture, blood (Routine X 2) w Reflex to ID Panel     Status: None (Preliminary result)   Collection Time: 05/08/24  6:47 AM   Specimen: BLOOD  Result Value Ref Range Status   Specimen Description BLOOD BLOOD LEFT HAND  Final   Special Requests   Final    BOTTLES DRAWN AEROBIC AND ANAEROBIC Blood Culture adequate volume   Culture   Final    NO GROWTH 3 DAYS Performed at [facility], [address] [place], [place] [address]    Report Status PENDING  Incomplete  Culture, blood (Routine X 2) w Reflex to ID Panel     Status: None (Preliminary result)   Collection Time: 05/08/24  6:47 AM   Specimen: BLOOD  Result Value Ref Range Status   Specimen Description BLOOD BLOOD LEFT ARM  Final   Special Requests   Final    BOTTLES DRAWN AEROBIC AND ANAEROBIC Blood Culture adequate volume   Culture   Final    NO GROWTH 3 DAYS Performed at [facility], [address] [place], [place] [address]    Report Status PENDING  Incomplete     Labs: CBC: Recent Labs  Lab 05/08/24 0115 05/08/24 0647 05/09/24 0431 05/10/24 0424 05/11/24 0433  WBC 19.6* 22.4* 20.7* 15.4* 12.6*  NEUTROABS  --  16.6*  --   --   --   HGB 9.8* 9.8* 9.4* 8.3* 9.2*  HCT 29.0* 29.9* 28.2* 25.7* 28.3*  MCV 91.2 94.0 91.3 93.5 94.0  PLT 37* 43* 63* 94* 131*   Basic Metabolic Panel: Recent Labs  Lab 05/07/24 0525 05/08/24 0115 05/09/24 0431 05/10/24 0424 05/11/24 0433  NA 140 133* 136 138 138  K 3.7 3.7 3.9 3.3* 3.8  CL 105 103 103 104 106  CO2 26 19* 19* 23 25  GLUCOSE 76 174* 239* 143* 188*  BUN 112* 111* 111* 101* 89*  CREATININE 3.09* 2.67* 2.39* 2.26* 2.11*  CALCIUM 9.1 8.8* 9.0 8.8* 9.0  MG 2.6* 2.5* 2.5* 2.5* 2.3  PHOS 4.9* 4.7* 4.8* 4.4 3.7   Liver Function Tests: No  results for input(s): "AST", "ALT", "ALKPHOS", "BILITOT", "PROT", "ALBUMIN" in the last 168 hours. No results for input(s): "LIPASE", "AMYLASE" in the last 168 hours. No results for input(s): "AMMONIA" in the last 168 hours. Cardiac Enzymes: No results for input(s): "CKTOTAL", "CKMB", "CKMBINDEX", "TROPONINI" in the last 168 hours. BNP (last 3 results) No results for input(s): "BNP" in the last 8760 hours. CBG: Recent Labs  Lab 05/10/24 1538 05/10/24 2003 05/11/24 0004 05/11/24 0345 05/11/24 0747  GLUCAP 209* 314* 222* 173* 173*    Time spent: 35 minutes  Signed:  Althia Atlas  [facility] 05/11/2024 11:23 AM

## 2024-05-16 DIAGNOSIS — Z1159 Encounter for screening for other viral diseases: Principal | ICD-10-CM

## 2024-05-16 DIAGNOSIS — Z940 Kidney transplant status: Principal | ICD-10-CM

## 2024-05-16 DIAGNOSIS — Z79899 Other long term (current) drug therapy: Principal | ICD-10-CM

## 2024-05-16 LAB — SEROTONIN RELEASE ASSAY (SRA)
SRA .2 IU/mL UFH Ser-aCnc: <1 % (ref 0–20)
SRA 100IU/mL UFH Ser-aCnc: <1 % (ref 0–20)

## 2024-05-17 ENCOUNTER — Ambulatory Visit: Admit: 2024-05-17 | Discharge: 2024-05-18 | Payer: MEDICARE

## 2024-05-17 ENCOUNTER — Ambulatory Visit: Admit: 2024-05-17 | Discharge: 2024-05-18 | Payer: MEDICARE | Attending: Nephrology | Primary: Nephrology

## 2024-05-17 DIAGNOSIS — Z1159 Encounter for screening for other viral diseases: Principal | ICD-10-CM

## 2024-05-17 DIAGNOSIS — Z940 Kidney transplant status: Principal | ICD-10-CM

## 2024-05-17 DIAGNOSIS — R600 Localized edema: Principal | ICD-10-CM

## 2024-05-17 DIAGNOSIS — K200 Eosinophilic esophagitis: Principal | ICD-10-CM

## 2024-05-17 DIAGNOSIS — I10 Essential (primary) hypertension: Principal | ICD-10-CM

## 2024-05-17 DIAGNOSIS — E875 Hyperkalemia: Principal | ICD-10-CM

## 2024-05-17 DIAGNOSIS — E785 Hyperlipidemia, unspecified: Principal | ICD-10-CM

## 2024-05-17 DIAGNOSIS — N179 Acute kidney failure, unspecified: Principal | ICD-10-CM

## 2024-05-17 DIAGNOSIS — Z79899 Other long term (current) drug therapy: Principal | ICD-10-CM

## 2024-05-17 MED ORDER — FUROSEMIDE 20 MG TABLET
ORAL_TABLET | Freq: Two times a day (BID) | ORAL | 3 refills | 30.00000 days | Status: CP | PRN
Start: 2024-05-17 — End: 2024-09-14

## 2024-05-18 DIAGNOSIS — K200 Eosinophilic esophagitis: Principal | ICD-10-CM

## 2024-05-18 DIAGNOSIS — Z940 Kidney transplant status: Principal | ICD-10-CM

## 2024-05-18 LAB — CULTURE, BLOOD (ROUTINE X 2)
Culture: NO GROWTH
Culture: NO GROWTH
Special Requests: ADEQUATE
Special Requests: ADEQUATE

## 2024-05-18 MED ORDER — TACROLIMUS 1 MG CAPSULE, IMMEDIATE-RELEASE
ORAL_CAPSULE | Freq: Two times a day (BID) | ORAL | 3 refills | 90.00000 days | Status: CP
Start: 2024-05-18 — End: 2025-05-18

## 2024-05-26 MED ORDER — PEG 3350-ELECTROLYTES 236 GRAM-22.74 GRAM-6.74 GRAM-5.86 GRAM SOLUTION
Freq: Once | ORAL | 0 refills | 1.00000 days | Status: CP
Start: 2024-05-26 — End: 2024-05-26

## 2024-05-27 ENCOUNTER — Inpatient Hospital Stay
Admit: 2024-05-27 | Discharge: 2024-05-30 | Disposition: A | Discharge status: Home / Self Care | Payer: MEDICARE | Admitting: Nephrology

## 2024-05-27 ENCOUNTER — Encounter
Admit: 2024-05-27 | Discharge: 2024-05-30 | Disposition: A | Discharge status: Home / Self Care | Payer: MEDICARE | Attending: Nephrology | Admitting: Nephrology

## 2024-05-27 ENCOUNTER — Encounter
Admit: 2024-05-27 | Discharge: 2024-05-30 | Disposition: A | Discharge status: Home / Self Care | Payer: MEDICARE | Admitting: Nephrology

## 2024-05-27 ENCOUNTER — Encounter
Admit: 2024-05-27 | Discharge: 2024-05-30 | Disposition: A | Discharge status: Home / Self Care | Payer: MEDICARE | Attending: Student in an Organized Health Care Education/Training Program | Admitting: Nephrology

## 2024-05-27 ENCOUNTER — Ambulatory Visit
Admit: 2024-05-27 | Discharge: 2024-05-30 | Disposition: A | Discharge status: Home / Self Care | Payer: MEDICARE | Admitting: Nephrology

## 2024-05-27 DIAGNOSIS — M7989 Other specified soft tissue disorders: Principal | ICD-10-CM

## 2024-05-27 DIAGNOSIS — R509 Fever, unspecified: Principal | ICD-10-CM

## 2024-05-27 DIAGNOSIS — R21 Rash and other nonspecific skin eruption: Principal | ICD-10-CM

## 2024-05-30 MED ORDER — AMOXICILLIN 500 MG CAPSULE
ORAL_CAPSULE | Freq: Three times a day (TID) | ORAL | 0 refills | 5.00000 days | Status: CN
Start: 2024-05-30 — End: 2024-06-04

## 2024-05-30 MED ORDER — AMOXICILLIN 875 MG-POTASSIUM CLAVULANATE 125 MG TABLET
ORAL_TABLET | Freq: Two times a day (BID) | ORAL | 0 refills | 7.00000 days | Status: CN
Start: 2024-05-30 — End: ?

## 2024-05-30 MED FILL — AMOXICILLIN 875 MG-POTASSIUM CLAVULANATE 125 MG TABLET: ORAL | 7 days supply | Qty: 13 | Fill #0

## 2024-06-19 ENCOUNTER — Ambulatory Visit: Admit: 2024-06-19 | Discharge: 2024-06-20 | Payer: MEDICARE

## 2024-06-19 DIAGNOSIS — Z79899 Other long term (current) drug therapy: Principal | ICD-10-CM

## 2024-06-19 DIAGNOSIS — Z1159 Encounter for screening for other viral diseases: Principal | ICD-10-CM

## 2024-06-19 DIAGNOSIS — N390 Urinary tract infection, site not specified: Principal | ICD-10-CM

## 2024-06-19 DIAGNOSIS — Z940 Kidney transplant status: Principal | ICD-10-CM

## 2024-06-21 DIAGNOSIS — Z1159 Encounter for screening for other viral diseases: Principal | ICD-10-CM

## 2024-06-21 DIAGNOSIS — Z940 Kidney transplant status: Principal | ICD-10-CM

## 2024-06-21 DIAGNOSIS — Z79899 Other long term (current) drug therapy: Principal | ICD-10-CM

## 2024-06-21 MED ORDER — TACROLIMUS 1 MG CAPSULE, IMMEDIATE-RELEASE
ORAL_CAPSULE | ORAL | 3 refills | 90.00000 days | Status: CP
Start: 2024-06-21 — End: 2025-06-21

## 2024-06-26 ENCOUNTER — Ambulatory Visit: Admit: 2024-06-26 | Discharge: 2024-07-01 | Disposition: A | Discharge status: Home / Self Care | Payer: MEDICARE

## 2024-06-26 ENCOUNTER — Encounter: Admit: 2024-06-26 | Discharge: 2024-07-01 | Disposition: A | Discharge status: Home / Self Care | Payer: MEDICARE

## 2024-06-26 ENCOUNTER — Ambulatory Visit: Admit: 2024-06-26 | Discharge: 2024-07-01 | Payer: MEDICARE

## 2024-06-26 ENCOUNTER — Inpatient Hospital Stay: Admit: 2024-06-26 | Discharge: 2024-07-01 | Disposition: A | Discharge status: Home / Self Care | Payer: MEDICARE

## 2024-06-28 DIAGNOSIS — N390 Urinary tract infection, site not specified: Principal | ICD-10-CM

## 2024-07-01 MED ORDER — CIPROFLOXACIN 500 MG TABLET
ORAL_TABLET | Freq: Two times a day (BID) | ORAL | 0 refills | 18.00000 days | Status: CP
Start: 2024-07-01 — End: 2024-07-19
  Filled 2024-07-01: qty 36, 18d supply, fill #0

## 2024-07-01 MED ORDER — OMEPRAZOLE 20 MG CAPSULE,DELAYED RELEASE
ORAL_CAPSULE | Freq: Two times a day (BID) | ORAL | 0 refills | 90.00000 days | Status: CP
Start: 2024-07-01 — End: 2024-09-29

## 2024-07-01 MED ORDER — TACROLIMUS 1 MG CAPSULE, IMMEDIATE-RELEASE
ORAL_CAPSULE | Freq: Two times a day (BID) | ORAL | 11 refills | 30.00000 days | Status: CP
Start: 2024-07-01 — End: 2025-07-01

## 2024-07-01 MED ORDER — VANCOMYCIN 125 MG CAPSULE
ORAL_CAPSULE | Freq: Four times a day (QID) | ORAL | 0 refills | 10.00000 days | Status: CN
Start: 2024-07-01 — End: 2024-07-11

## 2024-07-01 MED FILL — VANCOMYCIN 125 MG CAPSULE: ORAL | 13 days supply | Qty: 46 | Fill #0

## 2024-07-03 ENCOUNTER — Other Ambulatory Visit (HOSPITAL_COMMUNITY): Payer: Self-pay

## 2024-07-04 DIAGNOSIS — Z940 Kidney transplant status: Principal | ICD-10-CM

## 2024-07-06 ENCOUNTER — Other Ambulatory Visit
Admission: RE | Admit: 2024-07-06 | Discharge: 2024-07-06 | Disposition: A | Discharge status: Home / Self Care | Attending: Nephrology | Admitting: Nephrology

## 2024-07-06 DIAGNOSIS — B259 Cytomegaloviral disease, unspecified: Secondary | ICD-10-CM | POA: Diagnosis not present

## 2024-07-06 DIAGNOSIS — Z79899 Other long term (current) drug therapy: Secondary | ICD-10-CM | POA: Diagnosis not present

## 2024-07-06 DIAGNOSIS — Z9483 Pancreas transplant status: Secondary | ICD-10-CM | POA: Insufficient documentation

## 2024-07-06 DIAGNOSIS — E1129 Type 2 diabetes mellitus with other diabetic kidney complication: Secondary | ICD-10-CM | POA: Insufficient documentation

## 2024-07-06 DIAGNOSIS — Z789 Other specified health status: Secondary | ICD-10-CM | POA: Insufficient documentation

## 2024-07-06 DIAGNOSIS — Z940 Kidney transplant status: Secondary | ICD-10-CM | POA: Diagnosis present

## 2024-07-06 DIAGNOSIS — Z114 Encounter for screening for human immunodeficiency virus [HIV]: Secondary | ICD-10-CM | POA: Insufficient documentation

## 2024-07-06 DIAGNOSIS — E559 Vitamin D deficiency, unspecified: Secondary | ICD-10-CM | POA: Insufficient documentation

## 2024-07-06 DIAGNOSIS — D899 Disorder involving the immune mechanism, unspecified: Secondary | ICD-10-CM | POA: Diagnosis present

## 2024-07-06 DIAGNOSIS — T8610 Unspecified complication of kidney transplant: Secondary | ICD-10-CM | POA: Insufficient documentation

## 2024-07-06 DIAGNOSIS — Z09 Encounter for follow-up examination after completed treatment for conditions other than malignant neoplasm: Secondary | ICD-10-CM | POA: Diagnosis not present

## 2024-07-06 DIAGNOSIS — N390 Urinary tract infection, site not specified: Secondary | ICD-10-CM | POA: Diagnosis not present

## 2024-07-06 DIAGNOSIS — D631 Anemia in chronic kidney disease: Secondary | ICD-10-CM | POA: Insufficient documentation

## 2024-07-06 LAB — CBC WITH DIFFERENTIAL/PLATELET
Abs Immature Granulocytes: 0.07 K/uL (ref 0.00–0.07)
Basophils Absolute: 0.1 K/uL (ref 0.0–0.1)
Basophils Relative: 2 %
Eosinophils Absolute: 1.1 K/uL — ABNORMAL HIGH (ref 0.0–0.5)
Eosinophils Relative: 19 %
HCT: 30.9 % — ABNORMAL LOW (ref 39.0–52.0)
Hemoglobin: 9.6 g/dL — ABNORMAL LOW (ref 13.0–17.0)
Immature Granulocytes: 1 %
Lymphocytes Relative: 22 %
Lymphs Abs: 1.2 K/uL (ref 0.7–4.0)
MCH: 29.8 pg (ref 26.0–34.0)
MCHC: 31.1 g/dL (ref 30.0–36.0)
MCV: 96 fL (ref 80.0–100.0)
Monocytes Absolute: 0.5 K/uL (ref 0.1–1.0)
Monocytes Relative: 10 %
Neutro Abs: 2.6 K/uL (ref 1.7–7.7)
Neutrophils Relative %: 46 %
Platelets: 245 K/uL (ref 150–400)
RBC: 3.22 MIL/uL — ABNORMAL LOW (ref 4.22–5.81)
RDW: 14.6 % (ref 11.5–15.5)
WBC: 5.6 K/uL (ref 4.0–10.5)
nRBC: 0 % (ref 0.0–0.2)

## 2024-07-06 LAB — BASIC METABOLIC PANEL WITH GFR
Anion gap: 8 (ref 5–15)
BUN: 22 mg/dL (ref 8–23)
CO2: 26 mmol/L (ref 22–32)
Calcium: 8.2 mg/dL — ABNORMAL LOW (ref 8.9–10.3)
Chloride: 107 mmol/L (ref 98–111)
Creatinine, Ser: 1.74 mg/dL — ABNORMAL HIGH (ref 0.61–1.24)
GFR, Estimated: 43 mL/min — ABNORMAL LOW (ref >60)
Glucose, Bld: 142 mg/dL — ABNORMAL HIGH (ref 70–99)
Potassium: 4 mmol/L (ref 3.5–5.1)
Sodium: 141 mmol/L (ref 135–145)

## 2024-07-06 LAB — PHOSPHORUS: Phosphorus: 3.5 mg/dL (ref 2.5–4.6)

## 2024-07-06 LAB — MAGNESIUM: Magnesium: 1.7 mg/dL (ref 1.7–2.4)

## 2024-07-14 DIAGNOSIS — Z940 Kidney transplant status: Principal | ICD-10-CM

## 2024-07-14 MED ORDER — CINACALCET 60 MG TABLET
ORAL_TABLET | Freq: Every day | ORAL | 0 refills | 0.00000 days
Start: 2024-07-14 — End: ?

## 2024-07-17 DIAGNOSIS — Z940 Kidney transplant status: Principal | ICD-10-CM

## 2024-07-17 DIAGNOSIS — Z79899 Other long term (current) drug therapy: Principal | ICD-10-CM

## 2024-07-17 DIAGNOSIS — N1832 Stage 3b chronic kidney disease (CKD) (CMS-HCC): Principal | ICD-10-CM

## 2024-07-17 DIAGNOSIS — Z1159 Encounter for screening for other viral diseases: Principal | ICD-10-CM

## 2024-07-17 MED ORDER — CINACALCET 60 MG TABLET
ORAL_TABLET | Freq: Every day | ORAL | 0 refills | 90.00000 days
Start: 2024-07-17 — End: ?

## 2024-07-19 ENCOUNTER — Ambulatory Visit: Admit: 2024-07-19 | Discharge: 2024-07-20 | Payer: MEDICARE

## 2024-07-19 DIAGNOSIS — Z940 Kidney transplant status: Principal | ICD-10-CM

## 2024-07-19 DIAGNOSIS — N390 Urinary tract infection, site not specified: Principal | ICD-10-CM

## 2024-07-19 MED ORDER — CINACALCET 60 MG TABLET
ORAL_TABLET | Freq: Every day | ORAL | 0 refills | 90.00000 days
Start: 2024-07-19 — End: ?

## 2024-07-20 DIAGNOSIS — Z940 Kidney transplant status: Principal | ICD-10-CM

## 2024-07-20 MED ORDER — CINACALCET 60 MG TABLET
ORAL_TABLET | Freq: Every day | ORAL | 11 refills | 30.00000 days | Status: CP
Start: 2024-07-20 — End: 2025-07-20

## 2024-07-26 ENCOUNTER — Ambulatory Visit: Admit: 2024-07-26 | Discharge: 2024-07-27 | Payer: MEDICARE

## 2024-07-26 ENCOUNTER — Ambulatory Visit: Admit: 2024-07-26 | Discharge: 2024-07-27 | Payer: MEDICARE | Attending: Nephrology | Primary: Nephrology

## 2024-07-26 DIAGNOSIS — N1832 Stage 3b chronic kidney disease (CKD) (CMS-HCC): Principal | ICD-10-CM

## 2024-07-26 DIAGNOSIS — Z940 Kidney transplant status: Principal | ICD-10-CM

## 2024-07-26 DIAGNOSIS — B9620 Unspecified Escherichia coli [E. coli] as the cause of diseases classified elsewhere: Principal | ICD-10-CM

## 2024-07-26 DIAGNOSIS — R600 Localized edema: Principal | ICD-10-CM

## 2024-07-26 DIAGNOSIS — Z1159 Encounter for screening for other viral diseases: Principal | ICD-10-CM

## 2024-07-26 DIAGNOSIS — I10 Essential (primary) hypertension: Principal | ICD-10-CM

## 2024-07-26 DIAGNOSIS — N179 Acute kidney failure, unspecified: Principal | ICD-10-CM

## 2024-07-26 DIAGNOSIS — K200 Eosinophilic esophagitis: Principal | ICD-10-CM

## 2024-07-26 DIAGNOSIS — N2581 Secondary hyperparathyroidism of renal origin: Principal | ICD-10-CM

## 2024-07-26 DIAGNOSIS — E785 Hyperlipidemia, unspecified: Principal | ICD-10-CM

## 2024-07-26 DIAGNOSIS — D849 Immunodeficiency, unspecified: Principal | ICD-10-CM

## 2024-07-26 DIAGNOSIS — N390 Urinary tract infection, site not specified: Principal | ICD-10-CM

## 2024-07-26 DIAGNOSIS — R197 Diarrhea, unspecified: Principal | ICD-10-CM

## 2024-07-26 DIAGNOSIS — A0472 Enterocolitis due to Clostridium difficile, not specified as recurrent: Principal | ICD-10-CM

## 2024-07-26 DIAGNOSIS — Z79899 Other long term (current) drug therapy: Principal | ICD-10-CM

## 2024-07-26 DIAGNOSIS — N1831 Stage 3a chronic kidney disease (CMS-HCC): Principal | ICD-10-CM

## 2024-07-26 MED ORDER — VANCOMYCIN 125 MG CAPSULE
ORAL_CAPSULE | Freq: Four times a day (QID) | ORAL | 0 refills | 10.00000 days | Status: CP
Start: 2024-07-26 — End: 2024-08-05

## 2024-07-28 DIAGNOSIS — D849 Immunodeficiency, unspecified: Principal | ICD-10-CM

## 2024-07-28 DIAGNOSIS — Z940 Kidney transplant status: Principal | ICD-10-CM

## 2024-08-03 ENCOUNTER — Ambulatory Visit: Admit: 2024-08-03 | Discharge: 2024-08-04 | Payer: MEDICARE

## 2024-08-03 DIAGNOSIS — K200 Eosinophilic esophagitis: Principal | ICD-10-CM

## 2024-08-03 DIAGNOSIS — A0471 Enterocolitis due to Clostridium difficile, recurrent: Principal | ICD-10-CM

## 2024-08-03 MED ORDER — OMEPRAZOLE 20 MG CAPSULE,DELAYED RELEASE
ORAL_CAPSULE | Freq: Two times a day (BID) | ORAL | 0 refills | 90.00000 days | Status: CP
Start: 2024-08-03 — End: 2024-11-01

## 2024-08-03 MED ORDER — BUDESONIDE 2 MG/10 ML ORAL SUSPENSION IN PACKET
Freq: Two times a day (BID) | ORAL | 1 refills | 0.00000 days | Status: CP
Start: 2024-08-03 — End: ?

## 2024-08-03 MED ORDER — VANCOMYCIN 125 MG CAPSULE
ORAL_CAPSULE | ORAL | 0 refills | 42.00000 days | Status: CP
Start: 2024-08-03 — End: 2024-09-14

## 2024-08-04 DIAGNOSIS — A0472 Enterocolitis due to Clostridium difficile, not specified as recurrent: Principal | ICD-10-CM

## 2024-08-04 MED ORDER — VANCOMYCIN 125 MG CAPSULE
ORAL_CAPSULE | 0 refills | 0.00000 days
Start: 2024-08-04 — End: ?

## 2024-08-07 DIAGNOSIS — A498 Other bacterial infections of unspecified site: Principal | ICD-10-CM

## 2024-08-07 DIAGNOSIS — N390 Urinary tract infection, site not specified: Principal | ICD-10-CM

## 2024-08-07 DIAGNOSIS — D369 Benign neoplasm, unspecified site: Principal | ICD-10-CM

## 2024-08-07 DIAGNOSIS — Z940 Kidney transplant status: Principal | ICD-10-CM

## 2024-08-07 MED ORDER — MYCOPHENOLATE SODIUM 360 MG TABLET,DELAYED RELEASE
ORAL_TABLET | Freq: Two times a day (BID) | ORAL | 3 refills | 90.00000 days | Status: CP
Start: 2024-08-07 — End: 2025-08-07

## 2024-08-07 MED ORDER — VOWST CAPSULE
ORAL_CAPSULE | Freq: Every day | ORAL | 0 refills | 3.00000 days | Status: CP
Start: 2024-08-07 — End: 2024-08-10

## 2024-08-08 MED ORDER — VANCOMYCIN 125 MG CAPSULE
ORAL_CAPSULE | 0 refills | 0.00000 days
Start: 2024-08-08 — End: ?

## 2024-08-23 NOTE — Unmapped (Signed)
 [facility] received a prescription for medication Vowst .    Clinic has been notified of the approved copay.  [facility] will profile the prescription at this time.  We will not onboard/schedule delivery until notified by the provider/clinic to proceed.

## 2024-08-23 NOTE — Unmapped (Signed)
 [facility] Specialty Medication Onboarding    Specialty Medication: Vowst   Prior Authorization: Approved   Financial Assistance: No - copay  <$25  Final Copay/Day Supply: $0 / 3    Insurance Restrictions: None     Notes to Pharmacist: n/a    The triage team has completed the benefits investigation and has determined that the patient is able to fill this medication at [facility]. Please contact the patient to complete the onboarding or follow up with the prescribing physician as needed.

## 2024-08-24 DIAGNOSIS — N390 Urinary tract infection, site not specified: Principal | ICD-10-CM

## 2024-08-24 MED ORDER — PEG 3350-ELECTROLYTES 236 GRAM-22.74 GRAM-6.74 GRAM-5.86 GRAM SOLUTION
Freq: Once | ORAL | 0 refills | 16.00000 days | Status: CP
Start: 2024-08-24 — End: 2024-08-25

## 2024-08-24 NOTE — Unmapped (Signed)
 [facility]    Patient Onboarding/Medication Counseling    Currently on every other day vanc taper. Will take last vanc dose 9/15, then start bowel prep either 9/16 or 9/17    Steven Cooley is a 68 y.o. male with recurrent C. Difficile infections who I am counseling today on initiation of therapy.  I am speaking to the patient.    Was a Nurse, learning disability used for this call? No    Verified patient's date of birth / HIPAA.    Specialty medication(s) to be sent: Infectious Disease: Vowst       Non-specialty medications/supplies to be sent: Gavilyte-G (or other NDC)      Medications not needed at this time: n/a           Vowst  (fecal microbiota spores,live)    Has patient been counseled to contact their provider if they have worsening diarrhea symptoms in between stopping antibiotics and starting Vowst , or while taking Vowst ? YES  Patients of the [facility] [facility] clinic at [facility]: call [phone]  Any other clinics: patient should refer to appointment contact information    Medication & Administration     Dosage: Take 4 capsules once daily for 3 consecutive days, beginning 2 to 4 days after completion of C. difficile treatment regimen (and 1 day after administering a bowel prep)   - Bowel prep RX provided? Yes - RX for Golytely (or generic) is on file and will be sent with Vowst  delivery    Administration:   Bowel prep instructions: begin 1-3 days after completing antibiotics  1) Mix product according to instructions on package (for total of 4000 mL).  2) Drink only one 8-ounce glass of preparation after mixing. Discard remainder.    Vowst : begin the day after taking bowel prep  Swallow capsules whole; do not open, crush, dissolve, or chew.  First dose: Do not eat or drink, except small amounts of water, for >=8 hours prior to first dose.  Subsequent doses: Administer on an empty stomach before the first meal of the day.    Adherence/Missed dose instructions: Take missed dose as soon as you remember, on an empty stomach if possible. If it is close to the time of your next dose, skip the dose and resume with your next scheduled dose.    Goals of Therapy     To reduce recurrence of Clostridioides difficile infection     Side Effects & Monitoring Parameters   Abdominal distention (31%)  Constipation (14%)  Nervous system: Chills (11%)  Diarrhea (3% to 10%)  Flatulence (4%)  Nausea (3%)      The following side effects should be reported to the provider:  Signs of an allergic reaction, like rash; hives; itching; red, swollen, blistered, or peeling skin with or without fever; wheezing; tightness in the chest or throat; trouble breathing, swallowing, or talking; unusual hoarseness; or swelling of the mouth, face, lips, tongue, or throat.   Worsening diarrhea symptoms in between stopping antibiotics and starting Vowst , or while taking Vowst       Contraindications, Warnings, & Precautions     Food allergies: Because product is manufactured from human fecal matter, may contain food allergens; however, potential for the product to cause adverse reactions due to food allergens is unknown.  Transmissible infectious agents: Manufactured from human fecal matter; may carry risk of transmitting infectious agents. Infections thought to be transmitted by this product should be reported to the manufacturer.    Drug/Food Interactions  Medication list reviewed in Epic. The patient was instructed to inform the care team before taking any new medications or supplements. No drug interactions identified.   Antibiotics: May decrease therapeutic effects of Vowst   Do not eat or drink, except small amounts of water, for >=8 hours prior to first dose. Administer subsequent doses on an empty stomach before the first meal of the day.     Storage, Handling Precautions, & Disposal   Store at 2??C to 25??C (36??F to 77??F) in the original packaging. Do not freeze.           Current Medications (including OTC/herbals), Comorbidities and Allergies     Current Medications[1]    Allergies[2]    Problem List[3]    Medication list has been reviewed and updated in Epic: Yes    Allergies have been reviewed and updated in Epic: Yes    Appropriateness of Therapy     Acute infections noted within Epic:  No active infections  Patient reported infection: None    Is the medication and dose appropriate based on diagnosis, medication list, comorbidities, allergies, medical history, patient???s ability to self-administer the medication, and therapeutic goals? Yes    Prescription has been clinically reviewed: Yes      Baseline Quality of Life Assessment      How many days over the past month did your condition  keep you from your normal activities? For example, brushing your teeth or getting up in the morning. Patient declined to answer    Financial Information     Medication Assistance provided: Prior Authorization    Anticipated copay of $0 reviewed with patient. Verified delivery address.    Delivery Information     Scheduled delivery date: 08/29/24    Expected start date: will start bowel prep either 9/16 or 9/17, then Vowst  1 day later      Medication will be delivered via Same Day Courier to the prescription address in [facility].  This shipment will not require a signature.      Explained the services we provide at [facility] and that each month we would call to set up refills.  Stressed importance of returning phone calls so that we could ensure they receive their medications in time each month.  Informed patient that we should be setting up refills 7-10 days prior to when they will run out of medication.  A pharmacist will reach out to perform a clinical assessment periodically.  Informed patient that a welcome packet, containing information about our pharmacy and other support services, a Notice of Privacy Practices, and a drug information handout will be sent.      The patient or caregiver noted above participated in the development of this care plan and knows that they can request review of or adjustments to the care plan at any time.      Patient or caregiver verbalized understanding of the above information as well as how to contact the pharmacy at [phone] option 4 with any questions/concerns.  The pharmacy is open Monday through Friday 8:30am-4:30pm.  A pharmacist is available 24/7 via pager to answer any clinical questions they may have.    Patient Specific Needs     Does the patient have any physical, cognitive, or cultural barriers? No    Does the patient have adequate living arrangements? (i.e. the ability to store and take their medication appropriately) Yes    Did you identify any home environmental safety or security hazards? No  Patient prefers to have medications discussed with  Patient     Is the patient or caregiver able to read and understand education materials at a high school level or above? Yes    Patient's primary language is  English     Is the patient high risk? No    Does the patient have an additional or emergency contact listed in their chart? Yes    SOCIAL DETERMINANTS OF HEALTH     At the [facility], we have learned that life circumstances - like trouble affording food, housing, utilities, or transportation can affect the health of many of our patients.   That is why we wanted to ask: are you currently experiencing any life circumstances that are negatively impacting your health and/or quality of life? Patient declined to answer    Social Drivers of Health     Food Insecurity: No Food Insecurity (08/03/2024)    Hunger Vital Sign     Worried About Running Out of Food in the Last Year: Never true     Ran Out of Food in the Last Year: Never true   Tobacco Use: Low Risk  (08/03/2024)    Patient History     Smoking Tobacco Use: Never     Smokeless Tobacco Use: Never     Passive Exposure: Never   Transportation Needs: No Transportation Needs (08/03/2024)    PRAPARE - Transportation     Lack of Transportation (Medical): No     Lack of Transportation (Non-Medical): No   Alcohol Use: Not At Risk (07/01/2023)    Received from [facility]    AUDIT-C     Q1: How often do you have a drink containing alcohol?: Never     Q2: How many drinks containing alcohol do you have on a typical day when you are drinking?: Patient does not drink     Q3: How often do you have six or more drinks on one occasion?: Never   Housing: Low Risk  (08/03/2024)    Housing     Within the past 12 months, have you ever stayed: outside, in a car, in a tent, in an overnight shelter, or temporarily in someone else's home (i.e. couch-surfing)?: No     Are you worried about losing your housing?: No   Physical Activity: Sufficiently Active (07/01/2023)    Received from [facility]    Exercise Vital Sign     On average, how many days per week do you engage in moderate to strenuous exercise (like a brisk walk)?: 3 days     On average, how many minutes do you engage in exercise at this level?: 60 min   Utilities: Low Risk  (08/03/2024)    Utilities     Within the past 12 months, have you been unable to get utilities (heat, electricity) when it was really needed?: No   Stress: Not on file   Interpersonal Safety: Patient Unable To Answer (08/03/2024)    Interpersonal Safety     Unsafe Where You Currently Live: Patient unable to answer     Physically Hurt by Anyone: Patient unable to answer     Abused by Anyone: Patient unable to answer   Substance Use: Not on file (10/24/2023)   Intimate Partner Violence: Not At Risk (06/26/2024)    Humiliation, Afraid, Rape, and Kick questionnaire     Fear of Current or Ex-Partner: No     Emotionally Abused: No     Physically  Abused: No     Sexually Abused: No   Social Connections: Socially Integrated (05/03/2024)    Received from [facility]    Social Connection and Isolation Panel     In a typical week, how many times do you talk on the phone with family, friends, or neighbors?: More than three times a week     How often do you get together with friends or relatives?: Twice a week     How often do you attend church or religious services?: More than 4 times per year     Do you belong to any clubs or organizations such as church groups, unions, fraternal or athletic groups, or school groups?: Yes     How often do you attend meetings of the clubs or organizations you belong to?: More than 4 times per year     Are you married, widowed, divorced, separated, never married, or living with a partner?: Married   Physicist, medical Strain: Low Risk  (07/04/2024)    Received from [facility]    Overall Financial Resource Strain (CARDIA)     Difficulty of Paying Living Expenses: Not hard at all   Health Literacy: Adequate Health Literacy (07/01/2023)    Received from [facility]    B1300 Health Literacy     Frequency of need for help with medical instructions: Never   Internet Connectivity: Not on file       Would you be willing to receive help with any of the needs that you have identified today? Not applicable       Aleck CHRISTELLA Gaskins, PharmD  [facility] Specialty Pharmacist         [1]   Current Outpatient Medications   Medication Sig Dispense Refill    fecal microbio spore,live-brpk (VOWST ) cap Take 4 capsules by mouth in the morning for 3 days. 12 capsule 0    amlodipine  (NORVASC ) 5 MG tablet Take 1 tablet (5 mg total) by mouth daily. 90 tablet 3    aspirin (ECOTRIN) 81 MG tablet Take 1 tablet (81 mg total) by mouth daily.      atorvastatin  (LIPITOR) 20 MG tablet Take 1 tablet (20 mg total) by mouth nightly. 90 tablet 3    brinzolamide-brimonidine (SIMBRINZA) 1-0.2 % DrpS Apply 1 drop to eye two (2) times a day. Right eye      budesonide  2 mg/10 mL SpPk Take 2 mg by mouth two (2) times a day. 1680 mL 1    cinacalcet  (SENSIPAR ) 60 MG tablet Take 1 tablet (60 mg total) by mouth daily. 30 tablet 11    docusate sodium (COLACE) 100 MG capsule Take 1 capsule (100 mg total) by mouth two (2) times a day.      ferrous sulfate 325 (65 FE) MG tablet Take 1 tablet (325 mg total) by mouth in the morning.      [Paused] furosemide  (LASIX ) 20 MG tablet Take 1 tablet (20 mg total) by mouth daily.      insulin aspart (NOVOLOG) 100 unit/mL vial INJECT UP TO 60 UNITS DAILY IN PUMP      latanoprostene bunod (VYZULTA) 0.024 % Drop Administer 1 drop to the right eye in the evening.      mycophenolate  (MYFORTIC ) 360 MG TbEC Take 1 tablet (360 mg total) by mouth two (2) times a day. 180 tablet 3    omeprazole  (PRILOSEC) 20 MG capsule Take 1 capsule (20 mg total) by mouth two (  2) times a day. 180 capsule 0    OMNIPOD 5 G6-G7 PODS, GEN 5, Crtg USE 1 POD EVERY THIRD DAY      polyethylene glycol (GOLYTELY) 236-22.74-6.74 gram solution Take 240 mL by mouth once for 1 dose. Take the morning prior to planned Vowst  administration 240 mL 0    tacrolimus  (PROGRAF ) 1 MG capsule Take 1 capsule (1 mg total) by mouth two (2) times a day. 60 capsule 11    timolol (TIMOPTIC) 0.5 % ophthalmic solution Administer 1 drop to the right eye two (2) times a day.      vancomycin  (VANCOCIN ) 125 MG capsule Take 1 capsule (125 mg total) by mouth 2 (two) times a day for 7 days, THEN 1 capsule (125 mg total) daily for 7 days, THEN 1 capsule (125 mg total) every other day for 28 days. 35 capsule 0    VITAMIN D3 2,000 unit cap TAKE 1 CAPSULE DAILY AT 0600 90 capsule 4     No current facility-administered medications for this visit.   [2] No Known Allergies  [3]   Patient Active Problem List  Diagnosis    Personal history of venous thrombosis and embolism    Kidney transplant 09/01/2011    Hypertension    Type 1 diabetes mellitus with nephropathy    (CMS-HCC)    Hyperlipidemia    Basal cell carcinoma of back    Kidney transplant recipient (HHS-HCC)    Immunocompromised (HHS-HCC)    Eosinophilic esophagitis    Fever    Petechial rash    E. coli UTI    Complicated UTI (urinary tract infection) Hypokalemia    AKI (acute kidney injury)    Hypomagnesemia    Constipation    Acute non-recurrent pansinusitis    Basal cell carcinoma (BCC) of scalp    Chronic venous insufficiency    E coli bacteremia    Hyperlipidemia due to type 1 diabetes mellitus    (CMS-HCC)    Lymphedema    Sepsis with multiple organ dysfunction (MOD)    (CMS-HCC)    Squamous cell carcinoma of skin of cheek

## 2024-08-29 MED FILL — GAVILYTE-G 236 GRAM-22.74 GRAM-6.74 GRAM-5.86 GRAM ORAL SOLUTION: ORAL | 1 days supply | Fill #0

## 2024-08-29 MED FILL — VOWST CAPSULE: ORAL | 3 days supply | Qty: 12 | Fill #0

## 2024-08-29 NOTE — Unmapped (Signed)
 Specialty Medication(s): Vowst     Steven Cooley has been dis-enrolled from the [facility] specialty pharmacy services as a result of therapy completion - expected therapy completion date: ~9/21.    Additional information provided to the patient: medication will be delivered 9/16, no further follow up needed from pharmacy    Aleck CHRISTELLA Gaskins, PharmD  [facility] Specialty Pharmacist

## 2024-08-30 DIAGNOSIS — N390 Urinary tract infection, site not specified: Principal | ICD-10-CM

## 2024-09-07 DIAGNOSIS — A0471 Enterocolitis due to Clostridium difficile, recurrent: Principal | ICD-10-CM

## 2024-09-08 ENCOUNTER — Other Ambulatory Visit
Admission: RE | Admit: 2024-09-08 | Discharge: 2024-09-08 | Disposition: A | Discharge status: Home / Self Care | Attending: Nephrology | Admitting: Nephrology

## 2024-09-08 DIAGNOSIS — Z79899 Other long term (current) drug therapy: Secondary | ICD-10-CM | POA: Diagnosis not present

## 2024-09-08 DIAGNOSIS — N390 Urinary tract infection, site not specified: Secondary | ICD-10-CM | POA: Diagnosis not present

## 2024-09-08 DIAGNOSIS — N189 Chronic kidney disease, unspecified: Secondary | ICD-10-CM | POA: Diagnosis not present

## 2024-09-08 DIAGNOSIS — Z9483 Pancreas transplant status: Secondary | ICD-10-CM | POA: Insufficient documentation

## 2024-09-08 DIAGNOSIS — D899 Disorder involving the immune mechanism, unspecified: Secondary | ICD-10-CM | POA: Diagnosis not present

## 2024-09-08 DIAGNOSIS — Z789 Other specified health status: Secondary | ICD-10-CM | POA: Diagnosis not present

## 2024-09-08 DIAGNOSIS — D631 Anemia in chronic kidney disease: Secondary | ICD-10-CM | POA: Insufficient documentation

## 2024-09-08 DIAGNOSIS — Z114 Encounter for screening for human immunodeficiency virus [HIV]: Secondary | ICD-10-CM | POA: Insufficient documentation

## 2024-09-08 DIAGNOSIS — E559 Vitamin D deficiency, unspecified: Secondary | ICD-10-CM | POA: Insufficient documentation

## 2024-09-08 DIAGNOSIS — Z940 Kidney transplant status: Secondary | ICD-10-CM | POA: Insufficient documentation

## 2024-09-08 DIAGNOSIS — E1129 Type 2 diabetes mellitus with other diabetic kidney complication: Secondary | ICD-10-CM | POA: Insufficient documentation

## 2024-09-08 DIAGNOSIS — Z09 Encounter for follow-up examination after completed treatment for conditions other than malignant neoplasm: Secondary | ICD-10-CM | POA: Diagnosis present

## 2024-09-08 DIAGNOSIS — B259 Cytomegaloviral disease, unspecified: Secondary | ICD-10-CM | POA: Diagnosis not present

## 2024-09-08 LAB — BASIC METABOLIC PANEL WITH GFR
Anion gap: 9 (ref 5–15)
BUN: 39 mg/dL — ABNORMAL HIGH (ref 8–23)
CO2: 21 mmol/L — ABNORMAL LOW (ref 22–32)
Calcium: 9.2 mg/dL (ref 8.9–10.3)
Chloride: 107 mmol/L (ref 98–111)
Creatinine, Ser: 1.49 mg/dL — ABNORMAL HIGH (ref 0.61–1.24)
GFR, Estimated: 51 mL/min — ABNORMAL LOW (ref >60)
Glucose, Bld: 126 mg/dL — ABNORMAL HIGH (ref 70–99)
Potassium: 4.4 mmol/L (ref 3.5–5.1)
Sodium: 137 mmol/L (ref 135–145)

## 2024-09-08 LAB — CBC WITH DIFFERENTIAL/PLATELET
Abs Immature Granulocytes: 0.02 K/uL (ref 0.00–0.07)
Basophils Absolute: 0.1 K/uL (ref 0.0–0.1)
Basophils Relative: 1 %
Eosinophils Absolute: 1.4 K/uL — ABNORMAL HIGH (ref 0.0–0.5)
Eosinophils Relative: 18 %
HCT: 34.2 % — ABNORMAL LOW (ref 39.0–52.0)
Hemoglobin: 10.9 g/dL — ABNORMAL LOW (ref 13.0–17.0)
Immature Granulocytes: 0 %
Lymphocytes Relative: 20 %
Lymphs Abs: 1.6 K/uL (ref 0.7–4.0)
MCH: 30.4 pg (ref 26.0–34.0)
MCHC: 31.9 g/dL (ref 30.0–36.0)
MCV: 95.3 fL (ref 80.0–100.0)
Monocytes Absolute: 0.6 K/uL (ref 0.1–1.0)
Monocytes Relative: 8 %
Neutro Abs: 4.1 K/uL (ref 1.7–7.7)
Neutrophils Relative %: 53 %
Platelets: 180 K/uL (ref 150–400)
RBC: 3.59 MIL/uL — ABNORMAL LOW (ref 4.22–5.81)
RDW: 14.8 % (ref 11.5–15.5)
WBC: 7.8 K/uL (ref 4.0–10.5)
nRBC: 0 % (ref 0.0–0.2)

## 2024-09-08 LAB — MAGNESIUM: Magnesium: 1.5 mg/dL — ABNORMAL LOW (ref 1.7–2.4)

## 2024-09-08 LAB — PHOSPHORUS: Phosphorus: 3.4 mg/dL (ref 2.5–4.6)

## 2024-09-11 DIAGNOSIS — K200 Eosinophilic esophagitis: Principal | ICD-10-CM

## 2024-10-09 ENCOUNTER — Ambulatory Visit
Admit: 2024-10-09 | Discharge: 2024-10-09 | Payer: MEDICARE | Attending: Physician Assistant | Primary: Physician Assistant

## 2024-10-09 ENCOUNTER — Inpatient Hospital Stay: Admit: 2024-10-09 | Discharge: 2024-10-09 | Payer: MEDICARE

## 2024-10-09 DIAGNOSIS — Z940 Kidney transplant status: Principal | ICD-10-CM

## 2024-10-09 DIAGNOSIS — N390 Urinary tract infection, site not specified: Principal | ICD-10-CM

## 2024-10-16 DIAGNOSIS — Z79899 Other long term (current) drug therapy: Principal | ICD-10-CM

## 2024-10-16 DIAGNOSIS — N390 Urinary tract infection, site not specified: Principal | ICD-10-CM

## 2024-10-16 DIAGNOSIS — Z1159 Encounter for screening for other viral diseases: Principal | ICD-10-CM

## 2024-10-16 DIAGNOSIS — Z940 Kidney transplant status: Principal | ICD-10-CM

## 2024-10-16 DIAGNOSIS — N189 Chronic kidney disease, unspecified: Principal | ICD-10-CM

## 2024-10-16 DIAGNOSIS — D631 Anemia in chronic kidney disease: Principal | ICD-10-CM

## 2024-10-18 DIAGNOSIS — Z940 Kidney transplant status: Principal | ICD-10-CM

## 2024-10-18 DIAGNOSIS — N390 Urinary tract infection, site not specified: Principal | ICD-10-CM

## 2024-10-18 MED ORDER — ATORVASTATIN 20 MG TABLET
ORAL_TABLET | Freq: Every evening | ORAL | 3 refills | 90.00000 days | Status: CP
Start: 2024-10-18 — End: ?

## 2024-10-18 MED ORDER — AMLODIPINE 5 MG TABLET
ORAL_TABLET | Freq: Every day | ORAL | 3 refills | 90.00000 days | Status: CP
Start: 2024-10-18 — End: 2025-10-18

## 2024-10-18 MED ORDER — LISINOPRIL 40 MG TABLET
ORAL_TABLET | Freq: Every day | ORAL | 3 refills | 90.00000 days
Start: 2024-10-18 — End: ?

## 2024-10-23 ENCOUNTER — Ambulatory Visit: Admit: 2024-10-23 | Discharge: 2024-10-24 | Payer: MEDICARE

## 2024-10-23 ENCOUNTER — Ambulatory Visit: Admit: 2024-10-23 | Discharge: 2024-10-24 | Payer: MEDICARE | Attending: Nephrology | Primary: Nephrology

## 2024-10-23 DIAGNOSIS — Z79899 Other long term (current) drug therapy: Principal | ICD-10-CM

## 2024-10-23 DIAGNOSIS — Z1159 Encounter for screening for other viral diseases: Principal | ICD-10-CM

## 2024-10-23 DIAGNOSIS — N189 Chronic kidney disease, unspecified: Principal | ICD-10-CM

## 2024-10-23 DIAGNOSIS — D631 Anemia in chronic kidney disease: Principal | ICD-10-CM

## 2024-10-23 DIAGNOSIS — Z940 Kidney transplant status: Principal | ICD-10-CM

## 2024-11-16 DIAGNOSIS — K200 Eosinophilic esophagitis: Principal | ICD-10-CM

## 2024-11-16 DIAGNOSIS — A0471 Enterocolitis due to Clostridium difficile, recurrent: Principal | ICD-10-CM

## 2024-11-27 DIAGNOSIS — N390 Urinary tract infection, site not specified: Principal | ICD-10-CM

## 2024-11-27 DIAGNOSIS — D369 Benign neoplasm, unspecified site: Principal | ICD-10-CM

## 2024-11-27 DIAGNOSIS — Z940 Kidney transplant status: Principal | ICD-10-CM

## 2024-11-27 MED ORDER — MYCOPHENOLATE SODIUM 360 MG TABLET,DELAYED RELEASE
ORAL_TABLET | Freq: Two times a day (BID) | ORAL | 3 refills | 90.00000 days | Status: CP
Start: 2024-11-27 — End: 2025-11-27

## 2024-12-01 DIAGNOSIS — K200 Eosinophilic esophagitis: Principal | ICD-10-CM

## 2024-12-01 MED ORDER — BUDESONIDE 0.5 MG/2 ML SUSPENSION FOR NEBULIZATION
Freq: Every evening | 11 refills | 7.00000 days | Status: CP
Start: 2024-12-01 — End: ?
  Filled 2024-12-13: qty 60, 7d supply, fill #0

## 2024-12-04 DIAGNOSIS — K200 Eosinophilic esophagitis: Principal | ICD-10-CM

## 2024-12-14 DIAGNOSIS — K200 Eosinophilic esophagitis: Principal | ICD-10-CM

## 2024-12-14 MED ORDER — OMEPRAZOLE 20 MG CAPSULE,DELAYED RELEASE
ORAL_CAPSULE | 3 refills | 0.00000 days
Start: 2024-12-14 — End: ?

## 2024-12-15 MED ORDER — OMEPRAZOLE 20 MG CAPSULE,DELAYED RELEASE
ORAL_CAPSULE | ORAL | 3 refills | 0.00000 days | Status: CP
Start: 2024-12-15 — End: ?

## 2024-12-19 DIAGNOSIS — Z940 Kidney transplant status: Principal | ICD-10-CM

## 2024-12-19 DIAGNOSIS — D369 Benign neoplasm, unspecified site: Principal | ICD-10-CM

## 2024-12-19 DIAGNOSIS — N390 Urinary tract infection, site not specified: Principal | ICD-10-CM

## 2024-12-19 MED ORDER — TACROLIMUS 1 MG CAPSULE, IMMEDIATE-RELEASE
ORAL_CAPSULE | Freq: Two times a day (BID) | ORAL | 3 refills | 90.00000 days | Status: CP
Start: 2024-12-19 — End: 2025-12-19

## 2024-12-19 MED ORDER — MYCOPHENOLATE SODIUM 360 MG TABLET,DELAYED RELEASE
ORAL_TABLET | Freq: Two times a day (BID) | ORAL | 3 refills | 90.00000 days | Status: CP
Start: 2024-12-19 — End: 2025-12-19

## 2024-12-19 MED ORDER — CINACALCET 60 MG TABLET
ORAL_TABLET | Freq: Every day | ORAL | 3 refills | 90.00000 days | Status: CP
Start: 2024-12-19 — End: 2025-12-19

## 2024-12-25 DIAGNOSIS — Z940 Kidney transplant status: Principal | ICD-10-CM

## 2024-12-26 DIAGNOSIS — Z1159 Encounter for screening for other viral diseases: Principal | ICD-10-CM

## 2024-12-26 DIAGNOSIS — Z940 Kidney transplant status: Principal | ICD-10-CM

## 2024-12-26 DIAGNOSIS — Z79899 Other long term (current) drug therapy: Principal | ICD-10-CM
